# Patient Record
Sex: Female | Born: 1940 | Race: Black or African American | Hispanic: No | Smoking: Never smoker
Health system: Southern US, Community
[De-identification: ages and names within clinical notes are randomized; demographics above are authoritative.]

## PROBLEM LIST (undated history)

## (undated) DIAGNOSIS — K219 Gastro-esophageal reflux disease without esophagitis: Secondary | ICD-10-CM

## (undated) DIAGNOSIS — D649 Anemia, unspecified: Principal | ICD-10-CM

## (undated) DIAGNOSIS — I10 Essential (primary) hypertension: Secondary | ICD-10-CM

## (undated) DIAGNOSIS — E039 Hypothyroidism, unspecified: Secondary | ICD-10-CM

## (undated) DIAGNOSIS — E079 Disorder of thyroid, unspecified: Secondary | ICD-10-CM

## (undated) DIAGNOSIS — E2740 Unspecified adrenocortical insufficiency: Secondary | ICD-10-CM

## (undated) DIAGNOSIS — I509 Heart failure, unspecified: Secondary | ICD-10-CM

## (undated) DIAGNOSIS — R519 Headache, unspecified: Secondary | ICD-10-CM

## (undated) DIAGNOSIS — J45909 Unspecified asthma, uncomplicated: Secondary | ICD-10-CM

## (undated) DIAGNOSIS — R51 Headache: Secondary | ICD-10-CM

## (undated) DIAGNOSIS — H409 Unspecified glaucoma: Secondary | ICD-10-CM

## (undated) DIAGNOSIS — IMO0001 Reserved for inherently not codable concepts without codable children: Secondary | ICD-10-CM

## (undated) HISTORY — DX: Anemia, unspecified: D64.9

## (undated) HISTORY — PX: BACK SURGERY: SHX140

## (undated) HISTORY — PX: OTHER SURGICAL HISTORY: SHX169

## (undated) HISTORY — PX: APPENDECTOMY: SHX54

## (undated) SURGERY — MINOR CAPSULOTOMY
Anesthesia: Topical | Laterality: Right

---

## 1998-02-25 ENCOUNTER — Other Ambulatory Visit: Admission: RE | Admit: 1998-02-25 | Discharge: 1998-02-25 | Payer: Self-pay | Admitting: Family Medicine

## 1998-03-01 ENCOUNTER — Ambulatory Visit (HOSPITAL_COMMUNITY): Admission: RE | Admit: 1998-03-01 | Discharge: 1998-03-01 | Payer: Self-pay | Admitting: Family Medicine

## 1998-03-30 ENCOUNTER — Ambulatory Visit (HOSPITAL_COMMUNITY): Admission: RE | Admit: 1998-03-30 | Discharge: 1998-03-30 | Payer: Self-pay | Admitting: Family Medicine

## 1998-07-25 ENCOUNTER — Ambulatory Visit (HOSPITAL_COMMUNITY): Admission: RE | Admit: 1998-07-25 | Discharge: 1998-07-25 | Payer: Self-pay | Admitting: *Deleted

## 1998-07-25 ENCOUNTER — Inpatient Hospital Stay (HOSPITAL_COMMUNITY): Admission: EM | Admit: 1998-07-25 | Discharge: 1998-07-28 | Payer: Self-pay | Admitting: Emergency Medicine

## 1998-07-25 ENCOUNTER — Encounter: Payer: Self-pay | Admitting: Emergency Medicine

## 1998-10-17 ENCOUNTER — Ambulatory Visit (HOSPITAL_COMMUNITY): Admission: RE | Admit: 1998-10-17 | Discharge: 1998-10-17 | Payer: Self-pay

## 2000-04-15 ENCOUNTER — Emergency Department (HOSPITAL_COMMUNITY): Admission: EM | Admit: 2000-04-15 | Discharge: 2000-04-15 | Payer: Self-pay | Admitting: Emergency Medicine

## 2000-08-23 ENCOUNTER — Emergency Department (HOSPITAL_COMMUNITY): Admission: EM | Admit: 2000-08-23 | Discharge: 2000-08-23 | Payer: Self-pay | Admitting: *Deleted

## 2002-11-06 ENCOUNTER — Encounter: Payer: Self-pay | Admitting: General Practice

## 2002-11-06 ENCOUNTER — Encounter: Admission: RE | Admit: 2002-11-06 | Discharge: 2002-11-06 | Payer: Self-pay | Admitting: General Practice

## 2002-12-08 ENCOUNTER — Encounter: Admission: RE | Admit: 2002-12-08 | Discharge: 2003-03-08 | Payer: Self-pay | Admitting: Family Medicine

## 2003-02-08 ENCOUNTER — Ambulatory Visit (HOSPITAL_COMMUNITY): Admission: RE | Admit: 2003-02-08 | Discharge: 2003-02-08 | Payer: Self-pay | Admitting: Internal Medicine

## 2003-02-08 ENCOUNTER — Encounter: Payer: Self-pay | Admitting: Internal Medicine

## 2003-03-11 ENCOUNTER — Ambulatory Visit (HOSPITAL_COMMUNITY): Admission: RE | Admit: 2003-03-11 | Discharge: 2003-03-11 | Payer: Self-pay | Admitting: Internal Medicine

## 2003-03-11 ENCOUNTER — Encounter: Payer: Self-pay | Admitting: Internal Medicine

## 2004-05-02 ENCOUNTER — Ambulatory Visit (HOSPITAL_COMMUNITY): Admission: RE | Admit: 2004-05-02 | Discharge: 2004-05-02 | Payer: Self-pay | Admitting: Obstetrics & Gynecology

## 2004-05-17 ENCOUNTER — Ambulatory Visit: Payer: Self-pay | Admitting: Internal Medicine

## 2004-05-25 ENCOUNTER — Ambulatory Visit (HOSPITAL_COMMUNITY): Admission: RE | Admit: 2004-05-25 | Discharge: 2004-05-25 | Payer: Self-pay | Admitting: Internal Medicine

## 2004-08-14 ENCOUNTER — Ambulatory Visit: Payer: Self-pay | Admitting: Internal Medicine

## 2005-06-28 ENCOUNTER — Ambulatory Visit (HOSPITAL_COMMUNITY): Admission: RE | Admit: 2005-06-28 | Discharge: 2005-06-28 | Payer: Self-pay | Admitting: Ophthalmology

## 2006-03-23 ENCOUNTER — Emergency Department (HOSPITAL_COMMUNITY): Admission: EM | Admit: 2006-03-23 | Discharge: 2006-03-24 | Payer: Self-pay | Admitting: Emergency Medicine

## 2006-07-11 ENCOUNTER — Ambulatory Visit (HOSPITAL_COMMUNITY): Admission: RE | Admit: 2006-07-11 | Discharge: 2006-07-11 | Payer: Self-pay | Admitting: Ophthalmology

## 2007-04-27 ENCOUNTER — Emergency Department (HOSPITAL_COMMUNITY): Admission: EM | Admit: 2007-04-27 | Discharge: 2007-04-27 | Payer: Self-pay | Admitting: Emergency Medicine

## 2008-03-05 ENCOUNTER — Emergency Department (HOSPITAL_COMMUNITY): Admission: EM | Admit: 2008-03-05 | Discharge: 2008-03-06 | Payer: Self-pay | Admitting: Emergency Medicine

## 2008-03-24 ENCOUNTER — Ambulatory Visit (HOSPITAL_BASED_OUTPATIENT_CLINIC_OR_DEPARTMENT_OTHER): Admission: RE | Admit: 2008-03-24 | Discharge: 2008-03-24 | Payer: Self-pay | Admitting: Orthopedic Surgery

## 2010-08-31 ENCOUNTER — Emergency Department (HOSPITAL_COMMUNITY)
Admission: EM | Admit: 2010-08-31 | Discharge: 2010-08-31 | Payer: Self-pay | Source: Home / Self Care | Admitting: Emergency Medicine

## 2010-08-31 LAB — URINE MICROSCOPIC-ADD ON

## 2010-08-31 LAB — URINALYSIS, ROUTINE W REFLEX MICROSCOPIC
Bilirubin Urine: NEGATIVE
Hemoglobin, Urine: NEGATIVE
Ketones, ur: NEGATIVE mg/dL
Leukocytes, UA: NEGATIVE
Nitrite: NEGATIVE
Protein, ur: 100 mg/dL — AB
Specific Gravity, Urine: 1.017 (ref 1.005–1.030)
Urine Glucose, Fasting: 100 mg/dL — AB
Urobilinogen, UA: 0.2 mg/dL (ref 0.0–1.0)
pH: 7 (ref 5.0–8.0)

## 2010-09-02 ENCOUNTER — Observation Stay (HOSPITAL_COMMUNITY)
Admission: EM | Admit: 2010-09-02 | Discharge: 2010-09-05 | Disposition: A | Discharge status: Other Acute Inpatient Hospital | Payer: Self-pay | Source: Home / Self Care | Attending: Internal Medicine | Admitting: Internal Medicine

## 2010-09-04 ENCOUNTER — Inpatient Hospital Stay (HOSPITAL_COMMUNITY)
Admission: EM | Admit: 2010-09-04 | Discharge: 2010-09-05 | Payer: Self-pay | Attending: Internal Medicine | Admitting: Internal Medicine

## 2010-09-11 LAB — CBC
HCT: 33.5 % — ABNORMAL LOW (ref 36.0–46.0)
HCT: 36.2 % (ref 36.0–46.0)
Hemoglobin: 11.6 g/dL — ABNORMAL LOW (ref 12.0–15.0)
Hemoglobin: 12.3 g/dL (ref 12.0–15.0)
MCH: 30.8 pg (ref 26.0–34.0)
MCH: 30.4 pg (ref 26.0–34.0)
MCHC: 34.6 g/dL (ref 30.0–36.0)
MCHC: 34 g/dL (ref 30.0–36.0)
MCV: 88.9 fL (ref 78.0–100.0)
MCV: 89.4 fL (ref 78.0–100.0)
Platelets: 272 10*3/uL (ref 150–400)
Platelets: 302 10*3/uL (ref 150–400)
RBC: 3.77 MIL/uL — ABNORMAL LOW (ref 3.87–5.11)
RBC: 4.05 MIL/uL (ref 3.87–5.11)
RDW: 12.8 % (ref 11.5–15.5)
RDW: 12.8 % (ref 11.5–15.5)
WBC: 5 10*3/uL (ref 4.0–10.5)
WBC: 4.9 10*3/uL (ref 4.0–10.5)

## 2010-09-11 LAB — URINALYSIS, MICROSCOPIC ONLY
Bilirubin Urine: NEGATIVE
Hgb urine dipstick: NEGATIVE
Ketones, ur: NEGATIVE mg/dL
Leukocytes, UA: NEGATIVE
Nitrite: NEGATIVE
Protein, ur: NEGATIVE mg/dL
Specific Gravity, Urine: 1.011 (ref 1.005–1.030)
Urine Glucose, Fasting: NEGATIVE mg/dL
Urobilinogen, UA: 1 mg/dL (ref 0.0–1.0)
pH: 5.5 (ref 5.0–8.0)

## 2010-09-11 LAB — BASIC METABOLIC PANEL
BUN: 23 mg/dL (ref 6–23)
BUN: 17 mg/dL (ref 6–23)
CO2: 28 mEq/L (ref 19–32)
CO2: 25 mEq/L (ref 19–32)
Calcium: 9.8 mg/dL (ref 8.4–10.5)
Calcium: 9.5 mg/dL (ref 8.4–10.5)
Chloride: 98 mEq/L (ref 96–112)
Chloride: 106 mEq/L (ref 96–112)
Creatinine, Ser: 1 mg/dL (ref 0.4–1.2)
Creatinine, Ser: 0.93 mg/dL (ref 0.4–1.2)
GFR calc Af Amer: >60 mL/min (ref >60)
GFR calc Af Amer: >60 mL/min (ref >60)
GFR calc non Af Amer: 55 mL/min — ABNORMAL LOW (ref >60)
GFR calc non Af Amer: 60 mL/min — ABNORMAL LOW (ref >60)
Glucose, Bld: 136 mg/dL — ABNORMAL HIGH (ref 70–99)
Glucose, Bld: 154 mg/dL — ABNORMAL HIGH (ref 70–99)
Potassium: 4.2 mEq/L (ref 3.5–5.1)
Potassium: 4.4 mEq/L (ref 3.5–5.1)
Sodium: 134 mEq/L — ABNORMAL LOW (ref 135–145)
Sodium: 137 mEq/L (ref 135–145)

## 2010-09-11 LAB — URINE CULTURE
Colony Count: 15000
Culture  Setup Time: 201201081123
Special Requests: NEGATIVE

## 2010-09-11 LAB — DIFFERENTIAL
Basophils Absolute: 0 10*3/uL (ref 0.0–0.1)
Basophils Absolute: 0 10*3/uL (ref 0.0–0.1)
Basophils Relative: 1 % (ref 0–1)
Basophils Relative: 0 % (ref 0–1)
Eosinophils Absolute: 0 10*3/uL (ref 0.0–0.7)
Eosinophils Absolute: 0 10*3/uL (ref 0.0–0.7)
Eosinophils Relative: 1 % (ref 0–5)
Eosinophils Relative: 0 % (ref 0–5)
Lymphocytes Relative: 30 % (ref 12–46)
Lymphocytes Relative: 20 % (ref 12–46)
Lymphs Abs: 1.5 10*3/uL (ref 0.7–4.0)
Lymphs Abs: 1 10*3/uL (ref 0.7–4.0)
Monocytes Absolute: 0.6 10*3/uL (ref 0.1–1.0)
Monocytes Absolute: 0.1 10*3/uL (ref 0.1–1.0)
Monocytes Relative: 12 % (ref 3–12)
Monocytes Relative: 2 % — ABNORMAL LOW (ref 3–12)
Neutro Abs: 2.8 10*3/uL (ref 1.7–7.7)
Neutro Abs: 3.9 10*3/uL (ref 1.7–7.7)
Neutrophils Relative %: 57 % (ref 43–77)
Neutrophils Relative %: 78 % — ABNORMAL HIGH (ref 43–77)

## 2010-09-11 LAB — VITAMIN B12: Vitamin B-12: 403 pg/mL (ref 211–911)

## 2010-09-11 LAB — TSH: TSH: 0.932 u[IU]/mL (ref 0.350–4.500)

## 2010-09-14 NOTE — Discharge Summary (Signed)
  Katie Mosley, FRANEY NO.:  [id number]  MEDICAL RECORD NO.:  [id number]          PATIENT TYPE:  INP  LOCATION:  3009                         FACILITY:  [facility]  PHYSICIAN:  Eleonore Chiquito, MD         DATE OF BIRTH:  08-06-1941  DATE OF ADMISSION:  09/04/2010 DATE OF DISCHARGE:  09/05/2010                              DISCHARGE SUMMARY   ADDENDUM  Discharge diagnoses include: 1. Radiculopathy, L3-L4 broad-based disk bulging, mild-to-moderate     facet hypertrophy. 2. Urinary tract infection. 3. Hypertension. 4. Hypothyroidism.  BRIEF HISTORY AND PHYSICAL:  Please refer to all the details from Dr. Paulina Fusi history and physical from September 04, 2010.  BRIEF HOSPITAL COURSE: 1. The patient was initially admitted to the [facility]     because of difficulty walking due to the pain.  The patient does     not speak [place] and she is from [place]..  The patient was     started on Prednisone and then was later seen by Neurosurgery for     further recommendations.  The patient was transferred to [facility] for possible surgery.  At this time, the Surgery has seen     the patient and cleared her for discharge as the patient is     improved.  They will follow the patient in 2-3 weeks in the office     and she will be discharged home on [facility]. 2. UTI.  The patient had a urine culture done in the hospital which     gave Pseudomonas aeruginosa sensitive to ciprofloxacin.  The     patient will be sent with 1 Cipro for 10 more days. 3. Hypertension.  The patient was Bystolic.  I am going to start the     patient on lisinopril as the patient has uncontrolled hypertension.     The patient will follow up with her primary care physician, Dr.     Jeanie Cooks for further recommendation. 4. Hypothyroidism.  The patient will continue to be on Synthroid 25     mcg p.o. daily.  The final medications on discharge include: 1. Flexeril 10 mg every 8 hours  as necessary. 2. Lisinopril 10 mg p.o. daily. 3. Medrol-Dosepak. 4. Mobic 7.5 mg p.o. b.i.d. 5. Vicodin 1-2 tablets by mouth every 6 hours as needed. 6. Cipro 500 mg 1 tablet p.o. twice a day. 7. Bystolic 10 mg p.o. daily. 8. Iron 65 one tablet p.o. b.i.d. 9. Levothyroxine 25 mcg 1 tablet p.o. daily. 10.Pravastatin 30 mg 1 tablet p.o. daily. 11.Latanoprost ophthalmic 1 drop in left eye daily at bedtime.  Follow up with Dr. Hazle Coca in 2-3 weeks and Dr. Jeanie Cooks.     Eleonore Chiquito, MD     GL/MEDQ  D:  09/05/2010  T:  09/06/2010  Job:  [id number]  cc:   Niel Hummer, MD Otilio Connors, M.D. Nolene Ebbs, M.D.  Electronically Signed by Eleonore Chiquito  on 09/12/2010 09:43:20 AM

## 2010-09-17 ENCOUNTER — Encounter: Payer: Self-pay | Admitting: Gastroenterology

## 2010-09-18 ENCOUNTER — Encounter: Payer: Self-pay | Admitting: Internal Medicine

## 2010-09-27 ENCOUNTER — Encounter: Admission: RE | Admit: 2010-09-27 | Payer: Self-pay | Source: Home / Self Care | Admitting: Internal Medicine

## 2010-10-09 ENCOUNTER — Other Ambulatory Visit: Payer: Self-pay | Admitting: Neurosurgery

## 2010-10-09 DIAGNOSIS — M545 Low back pain: Secondary | ICD-10-CM

## 2010-10-13 ENCOUNTER — Other Ambulatory Visit: Payer: Self-pay

## 2010-10-24 ENCOUNTER — Inpatient Hospital Stay (HOSPITAL_COMMUNITY): Payer: Medicare Other

## 2010-10-24 ENCOUNTER — Inpatient Hospital Stay (HOSPITAL_COMMUNITY)
Admission: RE | Admit: 2010-10-24 | Discharge: 2010-10-25 | DRG: 491 | Disposition: A | Discharge status: Home / Self Care | Payer: Medicare Other | Source: Ambulatory Visit | Attending: Neurosurgery | Admitting: Neurosurgery

## 2010-10-24 ENCOUNTER — Ambulatory Visit (HOSPITAL_COMMUNITY): Payer: Medicare Other

## 2010-10-24 DIAGNOSIS — K219 Gastro-esophageal reflux disease without esophagitis: Secondary | ICD-10-CM | POA: Diagnosis present

## 2010-10-24 DIAGNOSIS — I10 Essential (primary) hypertension: Secondary | ICD-10-CM | POA: Diagnosis present

## 2010-10-24 DIAGNOSIS — M5126 Other intervertebral disc displacement, lumbar region: Principal | ICD-10-CM | POA: Diagnosis present

## 2010-10-24 LAB — CBC
HCT: 35 % — ABNORMAL LOW (ref 36.0–46.0)
HCT: 34.6 % — ABNORMAL LOW (ref 36.0–46.0)
Hemoglobin: 12.3 g/dL (ref 12.0–15.0)
Hemoglobin: 11.7 g/dL — ABNORMAL LOW (ref 12.0–15.0)
MCHC: 35.1 g/dL (ref 30.0–36.0)
RBC: 4 MIL/uL (ref 3.87–5.11)
RBC: 3.97 MIL/uL (ref 3.87–5.11)
WBC: 4.6 10*3/uL (ref 4.0–10.5)

## 2010-10-24 LAB — BASIC METABOLIC PANEL
Calcium: 10 mg/dL (ref 8.4–10.5)
GFR calc Af Amer: >60 mL/min (ref >60)
GFR calc non Af Amer: >60 mL/min (ref >60)
Glucose, Bld: 111 mg/dL — ABNORMAL HIGH (ref 70–99)
Potassium: 4.2 mEq/L (ref 3.5–5.1)
Sodium: 133 mEq/L — ABNORMAL LOW (ref 135–145)

## 2010-10-24 LAB — URINE MICROSCOPIC-ADD ON

## 2010-10-24 LAB — URINALYSIS, ROUTINE W REFLEX MICROSCOPIC
Hgb urine dipstick: NEGATIVE
Leukocytes, UA: NEGATIVE
Specific Gravity, Urine: 1.019 (ref 1.005–1.030)
Urine Glucose, Fasting: NEGATIVE mg/dL
pH: 6 (ref 5.0–8.0)

## 2010-10-24 LAB — PROTIME-INR: INR: 0.87 (ref 0.00–1.49)

## 2010-10-24 LAB — APTT: aPTT: 24 seconds (ref 24–37)

## 2010-10-24 LAB — SURGICAL PCR SCREEN: Staphylococcus aureus: NEGATIVE

## 2010-11-03 NOTE — Op Note (Signed)
[name], Katie Mosley NO.:  [id number]  MEDICAL RECORD NO.:  [id number]           PATIENT TYPE:  I  LOCATION:  [id number]                         FACILITY:  [facility]  PHYSICIAN:  Otilio Connors, M.D.  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  POSTOPERATIVE DIAGNOSIS:  Bilateral herniated nucleus pulposus right L3- 4 with radiculopathy.  PROCEDURE:  Right L3-4 bilateral diskectomy, microdissection with microscope.  SURGEON:  Otilio Connors, MD  ASSISTANT:  Arnoldo Morale.  ANESTHESIA:  General endotracheal tube anesthesia.  ESTIMATED BLOOD LOSS:  Minimal.  BLOOD GIVEN:  None.  DRAINS:  None.  COMPLICATIONS:  None.  REASON FOR PROCEDURE:  The patient is a 70 year old woman who has been having right leg pain radiating to the anterior thigh with dysesthetic sensory change.  Reflex diminished also at right knee.  The patient showed some improvement over the couple of months of the severe pain. She was still having significant problems.  The patient elected to proceed with diskectomy.  PROCEDURE IN DETAIL:  The patient was brought operating room and general anesthesia was induced.  The patient was placed in prone position on Wilson frame with all pressure points padded.  The patient was prepped and draped in sterile fashion.  Site was then injected with 18 mL of 1% lidocaine with epinephrine.  Needle was placed interspace.  X-rays obtained showing needle was at 3-4 interspace.  An incision was made centered where the needle was.  Incision taken to fascia.  The fascia was incised and with muscle splitting  technique, was taken down to the spine.  We actually ended up a bit lateral and could feel the transverse processes, so this approach was aborted as probably we did not __________  lateral, so we actually incised the fascia and the spinous processes and a subperiosteal dissection was done over the L3 lamina after the facets exposed the pars.  Self-retaining retractor was placed and I placed a marker at the interspace.  Another x-ray was obtained confirming our position in 3-4 disk space.  Microscope was brought in for microdissection at this point.  High-speed drill was used to drill the pars of the lateral facet.  This was completed opening the foramen with Kerrison punches.  Removed some hypertrophic ligaments, and we could see the nerve root.  Below the nerve root, caudally was an extremely large disk herniation.  Disk herniation was then removed with pituitary rongeurs and __________ .  There was a large annular tear involving the disk space, placed a micropituitary in there to clean out the loose disk pieces.  We did not open it further.  When we were finished, we continued __________ at the L3 nerve root.  We followed it both distally and proximally and he did not have no further compression on it.  We got hemostasis with Gelfoam and thrombin.  This was then irrigated out with antibiotic solution.  We had good hemostasis.  The fascia was closed with 0 Vicryl sutures at  both spots.  Subcutaneous tissue closed with 2-0 and 3-0 Vicryl interrupted sutures.  Skin closed benzoin, Steri-Strips, dressing was placed.  The patient was placed back in the supine position, awakened from anesthesia, and transferred to recovery room in stable condition.          ______________________________ Otilio Connors, M.D.     JRH/MEDQ  D:  10/24/2010  T:  10/25/2010  Job:  [id number]  Electronically Signed by Hazle Coca M.D. on 11/03/2010 05:06:33 PM

## 2011-01-09 NOTE — Op Note (Signed)
NAMEEVANEE, SQUARE            ACCOUNT NO.:  [id number]   MEDICAL RECORD NO.:  [id number]          PATIENT TYPE:  AMB   LOCATION:  [place]                          FACILITY:  [facility]   PHYSICIAN:  Sheral Apley. Weingold, M.D.DATE OF BIRTH:  06/11/1941   DATE OF PROCEDURE:  03/24/2008  DATE OF DISCHARGE:                               OPERATIVE REPORT   PREOPERATIVE DIAGNOSIS:  Displaced right fifth proximal phalangeal  fracture.   POSTOPERATIVE DIAGNOSIS:  Displaced right fifth proximal phalangeal  fracture.   PROCEDURE:  Open reduction and internal fixation above using a 1.3 mm  six-hole plate and four 1.3 mm screws.   SURGEON:  Sheral Apley. Burney Gauze, MD   ASSISTANT:  None.   ANESTHESIA:  General.   TOURNIQUET TIME:  1 hour and 28 minutes.   COMPLICATIONS:  None.   DRAINS:  None.   OPERATIVE REPORT:  The patient taken to the operating suite after  induction of adequate general anesthesia, right upper extremity was  prepped in sterile fashion.  An Esmarch was used to exsanguinate the  limb and tourniquet was then inflated to 250 mmHg.  At this point in  time, an incision was made U-shaped over the proximal phalanx going to  the PIP and MP joints fifth digit right hand.  A large ulnar-based flap  was elevated and the extensor mechanism was split longitudinally.  A  periosteal window was elevated toward the radial side.  The fracture  site was identified.  It was reduced with longitudinal traction and  reduction clamp.  A single 1.5-mm lag screw was placed from ulnar to  radial, however, there was significant osteoporosis and screw cut out on  the radial side.  After this was done, decision was made to convert to  plate fixation.  At this point in time, a six-hole 1.3 mm plate was  fastened proximally and distally with 1.3 mm screws to restore anatomic  alignment.  The wound was then irrigated.  Intraoperative fluoroscopy  revealed near anatomic reduction in both the AP and  lateral oblique  view.  Periosteum was closed with 4-0 Vicryl.  The extensor mechanism  with 4-0 Mersilene followed by skin closure with 4-0 Vicryl Rapide  suture.  Xeroform, 4x4s and ulnar gutter splint was applied.  The  patient tolerated the procedure well and went to recovery room in stable  the fashion.      Sheral Apley Burney Gauze, M.D.  Electronically Signed    MAW/MEDQ  D:  03/24/2008  T:  03/25/2008  Job:  [id number]

## 2011-05-24 LAB — URINALYSIS, ROUTINE W REFLEX MICROSCOPIC
Glucose, UA: 100 — AB
Hgb urine dipstick: NEGATIVE
Ketones, ur: NEGATIVE
Protein, ur: NEGATIVE
Urobilinogen, UA: 1

## 2011-05-24 LAB — CBC
HCT: 31.5 — ABNORMAL LOW
MCHC: 33.3
MCV: 90.4
Platelets: 315
WBC: 7.9

## 2011-05-24 LAB — POCT I-STAT, CHEM 8
BUN: 15
Calcium, Ion: 1.25
Chloride: 98
Creatinine, Ser: 0.9
Glucose, Bld: 155 — ABNORMAL HIGH
HCT: 33 — ABNORMAL LOW
Hemoglobin: 11.2 — ABNORMAL LOW
Potassium: 4.5
Sodium: 130 — ABNORMAL LOW
TCO2: 23

## 2011-05-24 LAB — DIFFERENTIAL
Basophils Relative: 1
Eosinophils Absolute: 0
Eosinophils Relative: 0
Lymphs Abs: 1.3
Monocytes Relative: 9
Neutrophils Relative %: 74

## 2011-05-24 LAB — URINE MICROSCOPIC-ADD ON

## 2011-05-25 LAB — POCT I-STAT, CHEM 8
BUN: 30 — ABNORMAL HIGH
Calcium, Ion: 1.22
Chloride: 103
Creatinine, Ser: 1.2
Glucose, Bld: 95
HCT: 33 — ABNORMAL LOW
Hemoglobin: 11.2 — ABNORMAL LOW
Potassium: 5.7 — ABNORMAL HIGH
Sodium: 132 — ABNORMAL LOW
TCO2: 24

## 2011-08-23 ENCOUNTER — Other Ambulatory Visit: Payer: Self-pay | Admitting: Ophthalmology

## 2011-08-23 ENCOUNTER — Encounter: Payer: Self-pay | Admitting: Ophthalmology

## 2011-08-23 ENCOUNTER — Encounter (HOSPITAL_COMMUNITY)
Admission: RE | Disposition: A | Discharge status: Home / Self Care | Payer: Self-pay | Source: Ambulatory Visit | Attending: Ophthalmology

## 2011-08-23 ENCOUNTER — Ambulatory Visit (HOSPITAL_COMMUNITY)
Admission: RE | Admit: 2011-08-23 | Discharge: 2011-08-23 | Disposition: A | Discharge status: Home / Self Care | Payer: Medicare Other | Source: Ambulatory Visit | Attending: Ophthalmology | Admitting: Ophthalmology

## 2011-08-23 DIAGNOSIS — H269 Unspecified cataract: Secondary | ICD-10-CM | POA: Insufficient documentation

## 2011-08-23 HISTORY — PX: CAPSULOTOMY: SHX5412

## 2011-08-23 SURGERY — MINOR CAPSULOTOMY
Anesthesia: LOCAL | Laterality: Left

## 2011-08-23 SURGERY — MINOR CAPSULOTOMY
Anesthesia: Topical | Laterality: Right

## 2011-08-23 MED ORDER — APRACLONIDINE HCL 1 % OP SOLN
1.0000 [drp] | Freq: Two times a day (BID) | OPHTHALMIC | Status: DC
  Administered 2011-08-23: 1 [drp] via OPHTHALMIC

## 2011-08-23 MED ORDER — CYCLOPENTOLATE-PHENYLEPHRINE 0.2-1 % OP SOLN
OPHTHALMIC | Status: AC
  Administered 2011-08-23: 1 [drp] via OPHTHALMIC
  Filled 2011-08-23: qty 2

## 2011-08-23 MED ORDER — CYCLOPENTOLATE-PHENYLEPHRINE 0.2-1 % OP SOLN: 1.0000 [drp] | OPHTHALMIC | Status: DC

## 2011-08-23 MED ORDER — CYCLOPENTOLATE-PHENYLEPHRINE 0.2-1 % OP SOLN
1.0000 [drp] | OPHTHALMIC | Status: DC
  Administered 2011-08-23 (×2): 1 [drp] via OPHTHALMIC

## 2011-08-23 MED ORDER — APRACLONIDINE HCL 0.5 % OP SOLN: 1.0000 [drp] | Freq: Once | OPHTHALMIC | Status: DC

## 2011-08-23 MED ORDER — APRACLONIDINE HCL 0.5 % OP SOLN
OPHTHALMIC | Status: AC
  Administered 2011-08-23: 1 [drp] via OPHTHALMIC
  Filled 2011-08-23: qty 5

## 2011-08-23 NOTE — Progress Notes (Signed)
1204 cyclomydril 1 gtt od .Marland KitchenMarland Kitchen

## 2011-08-23 NOTE — H&P (Signed)
70 yo non English speaking female had cataract surgery right eye several years ago.  She has developed a cloudy posterior capsule which causes her not to see well.  She has been unamenable to laser surgery until now.  She is admitted at this time for yag laser capsulotomy right eye.                                       [facility]                                 Minor Surgery Room Physician's Record                                  [  ] Minor Procedure    [ x ]Yag Laser           08/23/2011   Brief History/ Findings:Opaque posterior capsule right eye. [facility]  Right   20/100        Left  20/80 No diagnosis found.  Local Anesthetic :  None   Anesthesia Post Note  Patient: Katie Mosley  Procedure(s) Performed: Yag laser capsulotomy right eye  Anesthesia type: nonen Patient location: PACU  Post pain: Pain level controlled  Post assessment: Pain level controlled  Last Vitals: There were no vitals filed for this visit.  Post vital signs: stable  Level of consciousness: awake  Complications: No apparent anesthesia complications   Procedure: Yag laser capsulotomy    Specimen Removed: none  Disposition:  [  ] Pathology, Routine                        [  ] Other     [  ] Disposed of  Condition of Patient Post Procedure: [ x ]  Stable  Discharge Instructions:  [  ]  Diet , regular   [  ] Other          Activity: [x  ] No restrictions  [  ] Other          Medication:none          Follow-up: 5 PM today          Other:  Time: 1 PM     [place]

## 2011-08-23 NOTE — Progress Notes (Signed)
Time out completed,yag laser done on right eye.

## 2011-08-23 NOTE — Progress Notes (Signed)
Patient ID: Katie Mosley, female   DOB: 01-Dec-1940, 70 y.o.   MRN: [id number]

## 2011-08-23 NOTE — Brief Op Note (Signed)
[place]                                 Minor Surgery Room Physician's Record                                  [  ] Minor Procedure    [ x ]Yag Laser           08/23/2011   Brief History/ Findings: 70 yo female has had cataract surgery od.  Now has opaque posterior capsule which causes her to have blurred vision.  To have yag laser capsulotomy to improve her vision. No diagnosis found.  Local Anesthesia    Anesthesia Post Note  Patient: Katie Mosley  Procedure(s) Performed: * No surgery found *  Anesthesia type:   Patient location: Short Stay  Post pain: Pain level controlled  Post assessment: Post-op Vital signs reviewed  Last Vitals: There were no vitals filed for this visit.  Post vital signs: stable  Level of consciousness: awake  Complications: No apparent anesthesia complications   Procedure:                                        [place]                                 Minor Surgery Room Physician's Record                                  [  ] Minor Procedure    [  ]Yag Laser           08/23/2011   Brief History/ Findings:  No diagnosis found.  Local Anesthetic :      Anesthesia Post Note  Patient: Katie Mosley  Procedure(s) Performed: * No surgery found  Anesthesia type:   Patient location: Short Stay  Post pain: Pain level controlled  Post assessment: Post-op Vital signs reviewed  Last Vitals: There were no vitals filed for this visit.  Post vital signs: stable  Level of consciousness: awake  Complications: No apparent anesthesia complications   Procedure: yag laser capsulotomy Total Energy   110 Total Bursts      38 Energy per shot   3.1     Specimen Removed: (Type & Number)   Disposition:  [  ] Pathology, Routine                        [  ] Other     [  ] Disposed of  Condition of Patient Post Procedure: [  ]  Stable  [  ] Other      Discharge Instructions:  [  ]  Diet , regular   [  ] Other          Activity: [  ] No restrictions  [  ] Other          Medication:           Follow-up:           Other:  Time:         Garlan Fair E       Specimen Removed: (Type & Number)   Disposition:  [  ] Pathology, Routine                        [  ] Other     [  ] Disposed of  Condition of Patient Post Procedure: [ x  Stable  [  ] Other     Discharge Instructions:  [  ]  Diet , regular   [  ] Other          Activity: [  ] No restrictions  [  ] Other          Medication:           Follow-up: 5 PM today          Other:   Time:  1:30       [place]

## 2011-08-24 ENCOUNTER — Encounter (HOSPITAL_COMMUNITY): Payer: Self-pay | Admitting: *Deleted

## 2011-08-25 ENCOUNTER — Ambulatory Visit: Admit: 2011-08-25 | Payer: Self-pay | Admitting: Ophthalmology

## 2011-10-10 ENCOUNTER — Other Ambulatory Visit: Payer: Self-pay | Admitting: Ophthalmology

## 2012-08-07 ENCOUNTER — Other Ambulatory Visit: Payer: Self-pay | Admitting: Internal Medicine

## 2012-08-07 DIAGNOSIS — Z1231 Encounter for screening mammogram for malignant neoplasm of breast: Secondary | ICD-10-CM

## 2012-09-01 ENCOUNTER — Ambulatory Visit (HOSPITAL_COMMUNITY)
Admission: RE | Admit: 2012-09-01 | Discharge: 2012-09-01 | Disposition: A | Discharge status: Home / Self Care | Payer: Medicare Other | Source: Ambulatory Visit | Attending: Internal Medicine | Admitting: Internal Medicine

## 2012-09-01 DIAGNOSIS — Z1231 Encounter for screening mammogram for malignant neoplasm of breast: Secondary | ICD-10-CM | POA: Insufficient documentation

## 2013-03-16 ENCOUNTER — Encounter (HOSPITAL_COMMUNITY): Payer: Self-pay | Admitting: *Deleted

## 2013-03-16 DIAGNOSIS — IMO0002 Reserved for concepts with insufficient information to code with codable children: Secondary | ICD-10-CM | POA: Insufficient documentation

## 2013-03-16 DIAGNOSIS — T18108A Unspecified foreign body in esophagus causing other injury, initial encounter: Secondary | ICD-10-CM | POA: Insufficient documentation

## 2013-03-16 DIAGNOSIS — R1310 Dysphagia, unspecified: Secondary | ICD-10-CM | POA: Insufficient documentation

## 2013-03-16 NOTE — ED Notes (Signed)
Pt stats that yesterday she was eating chicken and she felt like apiece of the chicken got stuck in her throat. Pt vomited today but has not eaten or drank anything all day. Pt has some pain in her throat. Pt voice is clear, pt denies difficulty breathing, pt not SOB.

## 2013-03-17 ENCOUNTER — Encounter (HOSPITAL_COMMUNITY): Admission: EM | Disposition: A | Discharge status: Home / Self Care | Payer: Self-pay | Source: Home / Self Care

## 2013-03-17 ENCOUNTER — Encounter (HOSPITAL_COMMUNITY): Payer: Self-pay | Admitting: Gastroenterology

## 2013-03-17 ENCOUNTER — Emergency Department (HOSPITAL_COMMUNITY)
Admission: EM | Admit: 2013-03-17 | Discharge: 2013-03-17 | Disposition: A | Discharge status: Home / Self Care | Payer: Medicare Other | Attending: Internal Medicine | Admitting: Internal Medicine

## 2013-03-17 ENCOUNTER — Emergency Department (HOSPITAL_COMMUNITY): Payer: Medicare Other

## 2013-03-17 DIAGNOSIS — T18128A Food in esophagus causing other injury, initial encounter: Secondary | ICD-10-CM

## 2013-03-17 DIAGNOSIS — T18108A Unspecified foreign body in esophagus causing other injury, initial encounter: Secondary | ICD-10-CM

## 2013-03-17 HISTORY — DX: Essential (primary) hypertension: I10

## 2013-03-17 HISTORY — DX: Gastro-esophageal reflux disease without esophagitis: K21.9

## 2013-03-17 HISTORY — DX: Hypothyroidism, unspecified: E03.9

## 2013-03-17 HISTORY — PX: ESOPHAGOGASTRODUODENOSCOPY: SHX5428

## 2013-03-17 SURGERY — EGD (ESOPHAGOGASTRODUODENOSCOPY)
Anesthesia: Moderate Sedation

## 2013-03-17 MED ORDER — MIDAZOLAM HCL 10 MG/2ML IJ SOLN
INTRAMUSCULAR | Status: DC | PRN
  Administered 2013-03-17: 1 mg via INTRAVENOUS
  Administered 2013-03-17: 2 mg via INTRAVENOUS

## 2013-03-17 MED ORDER — GLUCAGON HCL (RDNA) 1 MG IJ SOLR
1.0000 mg | Freq: Once | INTRAMUSCULAR | Status: AC
  Administered 2013-03-17: 1 mg via INTRAVENOUS
  Filled 2013-03-17: qty 1

## 2013-03-17 MED ORDER — MIDAZOLAM HCL 5 MG/ML IJ SOLN
INTRAMUSCULAR | Status: AC
  Filled 2013-03-17: qty 2

## 2013-03-17 MED ORDER — FENTANYL CITRATE 0.05 MG/ML IJ SOLN
INTRAMUSCULAR | Status: DC | PRN
  Administered 2013-03-17: 25 ug via INTRAVENOUS

## 2013-03-17 MED ORDER — FENTANYL CITRATE 0.05 MG/ML IJ SOLN
INTRAMUSCULAR | Status: AC
  Filled 2013-03-17: qty 2

## 2013-03-17 MED ORDER — SODIUM CHLORIDE 0.9 % IV SOLN
INTRAVENOUS | Status: DC
  Administered 2013-03-17: 03:00:00 via INTRAVENOUS

## 2013-03-17 MED ORDER — BUTAMBEN-TETRACAINE-BENZOCAINE 2-2-14 % EX AERO
INHALATION_SPRAY | CUTANEOUS | Status: DC | PRN
  Administered 2013-03-17: 2 via TOPICAL

## 2013-03-17 NOTE — Op Note (Signed)
[facility] [place], [id number]   ENDOSCOPY PROCEDURE REPORT  PATIENT: Katie, Mosley  MR#: [id number] BIRTHDATE: 1941-01-05 , 72  yrs. old GENDER: Female ENDOSCOPIST: Eustace Quail, MD REFERRED BY:  Dr. Florina Ou EDP PROCEDURE DATE:  03/17/2013 PROCEDURE:  EGD w/ fb removal ASA CLASS:     Class II INDICATIONS:  Dysphagia.  Meat impaction MEDICATIONS: Fentanyl 25 mcg IV and Versed 3 mg IV TOPICAL ANESTHETIC: Cetacaine Spray  DESCRIPTION OF PROCEDURE: After the risks benefits and alternatives of the procedure were thoroughly explained, informed consent was obtained.  The PENTAX GASTOROSCOPE [id number] endoscope was introduced through the mouth and advanced to the second portion of the duodenum. Without limitations.  The instrument was slowly withdrawn as the mucosa was fully examined.      EXAM: Proximal esophageal food impaction at focal 61mm stricture (@15cm ).  Impaction gently pushed into stomach.  Also, 6mm ring-like stricture at GEJ(32 cm).  The stomach and duodenum were normal.  Retroflexed views revealed no abnormalities.     The scope was then withdrawn from the patient and the procedure completed.  COMPLICATIONS: There were no complications.  ENDOSCOPIC IMPRESSION: 1. Proximal esophageal food impaction at focal 65mm stricture (@15cm ). S/P endoscopic removal. 2. 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 Otherwise normal exam  RECOMMENDATIONS: 1.  Clear liquids until 9 am, then soft foods rest of day.  Resume prior diet tomorrow. Be careful with meat 2.  Continue PPI 3.  Call my office to arrange EGD/DILATION at HOSPITAL (discussed with daughters)  REPEAT EXAM:  eSigned:  Eustace Quail, MD 03/17/2013 11:07 AM  [id number] Jeanie Cooks, MD and The Patient

## 2013-03-17 NOTE — ED Provider Notes (Signed)
History    CSN: [id number] Arrival date & time 03/16/13  2228  First MD Initiated Contact with Patient 03/17/13 [phone]     Chief Complaint  Patient presents with  . Foreign Body    in throat   (Consider location/radiation/quality/duration/timing/severity/associated sxs/prior Treatment) HPI This is a 72 year old female who was eating chicken 2 evenings ago. She believes she got a chicken bone stuck in her throat. She has a foreign body sensation in the back of her throat has been unable to eat or drink anything, even swallow her own saliva, since that time. The foreign body sensation is not severe. She is having no difficulty breathing.   History reviewed. No pertinent past medical history. Past Surgical History  Procedure Laterality Date  . Capsulotomy  08/23/2011    Procedure: MINOR CAPSULOTOMY;  Surgeon: Myrtha Mantis.;  Location: [place] [facility];  Service: Ophthalmology;  Laterality: Left;  YAG Capsulotomy Left Eye   History reviewed. No pertinent family history. History  Substance Use Topics  . Smoking status: Not on file  . Smokeless tobacco: Not on file  . Alcohol Use: Not on file   OB History   Grav Para Term Preterm Abortions TAB SAB Ect Mult Living                 Review of Systems  All other systems reviewed and are negative.    Allergies  Review of patient's allergies indicates no known allergies.  Home Medications   Current Outpatient Rx  Name  Route  Sig  Dispense  Refill  . furosemide (LASIX) 20 MG tablet   Oral   Take 20 mg by mouth daily as needed (foot swelling).         . lansoprazole (PREVACID) 30 MG capsule   Oral   Take 30 mg by mouth daily.           Marland Kitchen latanoprost (XALATAN) 0.005 % ophthalmic solution   Left Eye   Place 1 drop into the left eye at bedtime.           Marland Kitchen levothyroxine (SYNTHROID, LEVOTHROID) 25 MCG tablet   Oral   Take 25 mcg by mouth daily.           Marland Kitchen loratadine (CLARITIN) 10 MG tablet   Oral   Take 10 mg by  mouth daily. allergies         . metoprolol succinate (TOPROL-XL) 25 MG 24 hr tablet   Oral   Take 25 mg by mouth daily.          BP 160/66  Pulse 64  Temp(Src) 98 F (36.7 C) (Oral)  Resp 14  SpO2 98%  Physical Exam General: Well-developed, well-nourished female in no acute distress; appearance consistent with age of record HENT: normocephalic, atraumatic; normal appearing oropharynx; no foreign bodies seen; no dysphonia or stridor; poor dentition Eyes: pupils equal round and reactive to light; extraocular muscles intact Neck: supple Heart: regular rate and rhythm Lungs: clear to auscultation bilaterally Abdomen: soft; nondistended; nontender; bowel sounds present Extremities: No deformity; full range of motion; pulses normal Neurologic: Awake, alert; motor function intact in all extremities and symmetric; no facial droop Skin: Warm and dry Psychiatric: Normal mood and affect    ED Course  Procedures (including critical care time)   MDM  Nursing notes and vitals signs, including pulse oximetry, reviewed.  Summary of this visit's results, reviewed by myself:  Imaging Studies: Dg Neck Soft Tissue  03/17/2013   *RADIOLOGY  REPORT*  Clinical Data: Foreign body sensation in the throat.  Possible check-in bone stuck since Sunday.  Difficulty swallowing.  NECK SOFT TISSUES - 1+ VIEW  Comparison: None.  Findings: Prominent calcification of the thyroid cartilage.  No definite evidence of radiopaque foreign body.  Tracheal airway and esophageal air column appear intact.  No prevertebral soft tissue swelling.  Degenerative changes in the cervical spine.  No submental soft tissue swelling.  IMPRESSION: Calcifications in the neck likely represent prominent calcification in the thyroid cartilage.  No definite evidence of any radiopaque foreign body.   Original Report Authenticated By: Lucienne Capers, M.D.   3:38 AM No relief with IV glucagon. Still unable to swallow liquids.  4:07  AM Dr. Henrene Pastor to see patient in ED.   Wynetta Fines, MD 03/17/13 [phone]

## 2013-03-17 NOTE — H&P (Signed)
  HISTORY OF PRESENT ILLNESS:  Katie Mosley is a 72 y.o. female with GERD. Presents with meat impaction via ER. No response to glucagon. GI called a little after 4 am.Unable to handle secretions.  REVIEW OF SYSTEMS:  All non-GI ROS negative   Past Medical History  Diagnosis Date  . Hypertension   . Hypothyroidism   . GERD (gastroesophageal reflux disease)     Past Surgical History  Procedure Laterality Date  . Capsulotomy  08/23/2011    Procedure: MINOR CAPSULOTOMY;  Surgeon: Myrtha Mantis.;  Location: [facility];  Service: Ophthalmology;  Laterality: Left;  YAG Capsulotomy Left Eye    Social History [name]  reports that she does not drink alcohol or use illicit drugs. Her tobacco history is not on file.  family history is not on file.  No Known Allergies     PHYSICAL EXAMINATION: Vital signs: BP 194/84  Pulse 80  Temp(Src) 98.1 F (36.7 C) (Oral)  Resp 21  Ht 5' (1.524 m)  Wt 125 lb (56.7 kg)  BMI 24.41 kg/m2  SpO2 95% General: Well-developed, well-nourished, no acute distress HEENT: Sclerae are anicteric, conjunctiva pink. Oral mucosa intact Lungs: Clear Heart: Regular Abdomen: soft, nontender, nondistended, no obvious ascites, no peritoneal signs, normal bowel sounds. No organomegaly. Extremities: No edema Psychiatric: alert and oriented x3. Cooperative     ASSESSMENT:  1. Meat impaction 2. GERD   PLAN:  1. Urgent therapeutic EGD.The nature of the procedure, as well as the risks, benefits, and alternatives were carefully and thoroughly reviewed with the patient. Ample time for discussion and questions allowed. The patient understood, was satisfied, and agreed to proceed.

## 2013-03-17 NOTE — ED Notes (Signed)
Pt given cup of water to drink but still unable to swallow. MD aware.

## 2013-03-17 NOTE — ED Notes (Signed)
Went in to Pt room to get vitals while handing care over to the next shift, when getting ready to take pt vitals the nurse from Endo that had come to get the pt to go up stairs  Refused to let me take her vitals. Endo Nurse told me that pt would be in the computer and her vitals would show up. No more vitals done in the ED.

## 2013-03-17 NOTE — ED Notes (Signed)
Nurse First Rounds : Nurse explained delay , process and wait time , respirations unlabored / denies pain at this time.

## 2013-03-17 NOTE — ED Notes (Signed)
Gastroenterology here to take pt.

## 2013-03-18 ENCOUNTER — Encounter (HOSPITAL_COMMUNITY): Payer: Self-pay | Admitting: Internal Medicine

## 2013-03-24 ENCOUNTER — Other Ambulatory Visit: Payer: Self-pay | Admitting: Internal Medicine

## 2013-03-24 DIAGNOSIS — K222 Esophageal obstruction: Secondary | ICD-10-CM

## 2013-03-25 ENCOUNTER — Telehealth: Payer: Self-pay

## 2013-03-25 NOTE — Telephone Encounter (Signed)
Too bad. I made it clear to them that this was important and should not wait. If they decide to reschedule at some point, let me know and we will see what we can do.

## 2013-03-25 NOTE — Telephone Encounter (Signed)
Pt scheduled for EGD with [facility] 03/27/13@11am . Called pts family back to confirm this appt but family now states she does not have transportation for that day. State they are going out of town and want to know when they can reschedule her procedure. Dr. Henrene Pastor please advise.

## 2013-03-27 ENCOUNTER — Ambulatory Visit (HOSPITAL_COMMUNITY): Admit: 2013-03-27 | Payer: Medicare Other | Admitting: Internal Medicine

## 2013-03-27 ENCOUNTER — Encounter (HOSPITAL_COMMUNITY): Payer: Self-pay

## 2013-03-27 SURGERY — EGD (ESOPHAGOGASTRODUODENOSCOPY)
Anesthesia: Moderate Sedation

## 2014-08-04 ENCOUNTER — Other Ambulatory Visit (HOSPITAL_COMMUNITY): Payer: Self-pay | Admitting: Internal Medicine

## 2014-08-04 ENCOUNTER — Ambulatory Visit (HOSPITAL_COMMUNITY)
Admission: RE | Admit: 2014-08-04 | Discharge: 2014-08-04 | Disposition: A | Discharge status: Home / Self Care | Payer: Medicare Other | Source: Ambulatory Visit | Attending: Internal Medicine | Admitting: Internal Medicine

## 2014-08-04 DIAGNOSIS — R079 Chest pain, unspecified: Secondary | ICD-10-CM | POA: Insufficient documentation

## 2014-08-04 DIAGNOSIS — R0789 Other chest pain: Secondary | ICD-10-CM

## 2014-10-31 ENCOUNTER — Inpatient Hospital Stay (HOSPITAL_COMMUNITY)
Admission: EM | Admit: 2014-10-31 | Discharge: 2014-11-03 | DRG: 394 | Disposition: A | Discharge status: Home / Self Care | Payer: Medicare Other | Attending: Internal Medicine | Admitting: Internal Medicine

## 2014-10-31 ENCOUNTER — Encounter (HOSPITAL_COMMUNITY): Payer: Self-pay | Admitting: *Deleted

## 2014-10-31 ENCOUNTER — Emergency Department (HOSPITAL_COMMUNITY): Payer: Medicare Other

## 2014-10-31 DIAGNOSIS — K5900 Constipation, unspecified: Secondary | ICD-10-CM | POA: Diagnosis present

## 2014-10-31 DIAGNOSIS — I10 Essential (primary) hypertension: Secondary | ICD-10-CM

## 2014-10-31 DIAGNOSIS — E871 Hypo-osmolality and hyponatremia: Secondary | ICD-10-CM | POA: Diagnosis present

## 2014-10-31 DIAGNOSIS — K559 Vascular disorder of intestine, unspecified: Secondary | ICD-10-CM | POA: Diagnosis present

## 2014-10-31 DIAGNOSIS — E869 Volume depletion, unspecified: Secondary | ICD-10-CM | POA: Diagnosis present

## 2014-10-31 DIAGNOSIS — E039 Hypothyroidism, unspecified: Secondary | ICD-10-CM | POA: Diagnosis present

## 2014-10-31 DIAGNOSIS — K219 Gastro-esophageal reflux disease without esophagitis: Secondary | ICD-10-CM | POA: Diagnosis present

## 2014-10-31 DIAGNOSIS — R109 Unspecified abdominal pain: Secondary | ICD-10-CM | POA: Diagnosis not present

## 2014-10-31 LAB — COMPREHENSIVE METABOLIC PANEL
ALT: 26 U/L (ref 0–35)
AST: 37 U/L (ref 0–37)
Albumin: 3.8 g/dL (ref 3.5–5.2)
Alkaline Phosphatase: 74 U/L (ref 39–117)
Anion gap: 7 (ref 5–15)
BILIRUBIN TOTAL: 1 mg/dL (ref 0.3–1.2)
BUN: 25 mg/dL — ABNORMAL HIGH (ref 6–23)
CO2: 27 mmol/L (ref 19–32)
Calcium: 9.3 mg/dL (ref 8.4–10.5)
Chloride: 94 mmol/L — ABNORMAL LOW (ref 96–112)
Creatinine, Ser: 1.03 mg/dL (ref 0.50–1.10)
GFR calc Af Amer: 61 mL/min — ABNORMAL LOW (ref >90)
GFR, EST NON AFRICAN AMERICAN: 52 mL/min — AB (ref >90)
Glucose, Bld: 102 mg/dL — ABNORMAL HIGH (ref 70–99)
Potassium: 4 mmol/L (ref 3.5–5.1)
SODIUM: 128 mmol/L — AB (ref 135–145)
Total Protein: 7.7 g/dL (ref 6.0–8.3)

## 2014-10-31 LAB — CBC WITH DIFFERENTIAL/PLATELET
Basophils Absolute: 0 10*3/uL (ref 0.0–0.1)
Basophils Relative: 0 % (ref 0–1)
Eosinophils Absolute: 0 10*3/uL (ref 0.0–0.7)
Eosinophils Relative: 0 % (ref 0–5)
HCT: 35.4 % — ABNORMAL LOW (ref 36.0–46.0)
Hemoglobin: 12.4 g/dL (ref 12.0–15.0)
LYMPHS ABS: 1.5 10*3/uL (ref 0.7–4.0)
LYMPHS PCT: 13 % (ref 12–46)
MCH: 30.2 pg (ref 26.0–34.0)
MCHC: 35 g/dL (ref 30.0–36.0)
MCV: 86.3 fL (ref 78.0–100.0)
Monocytes Absolute: 0.8 10*3/uL (ref 0.1–1.0)
Monocytes Relative: 7 % (ref 3–12)
NEUTROS PCT: 80 % — AB (ref 43–77)
Neutro Abs: 9.4 10*3/uL — ABNORMAL HIGH (ref 1.7–7.7)
Platelets: 338 10*3/uL (ref 150–400)
RBC: 4.1 MIL/uL (ref 3.87–5.11)
RDW: 13 % (ref 11.5–15.5)
WBC: 11.7 10*3/uL — ABNORMAL HIGH (ref 4.0–10.5)

## 2014-10-31 LAB — URINE MICROSCOPIC-ADD ON

## 2014-10-31 LAB — URINALYSIS, ROUTINE W REFLEX MICROSCOPIC
BILIRUBIN URINE: NEGATIVE
GLUCOSE, UA: NEGATIVE mg/dL
Ketones, ur: NEGATIVE mg/dL
Leukocytes, UA: NEGATIVE
Nitrite: NEGATIVE
PH: 5.5 (ref 5.0–8.0)
PROTEIN: 30 mg/dL — AB
SPECIFIC GRAVITY, URINE: 1.008 (ref 1.005–1.030)
UROBILINOGEN UA: 0.2 mg/dL (ref 0.0–1.0)

## 2014-10-31 LAB — I-STAT CG4 LACTIC ACID, ED: Lactic Acid, Venous: 0.67 mmol/L (ref 0.5–2.0)

## 2014-10-31 LAB — APTT: APTT: 33 s (ref 24–37)

## 2014-10-31 LAB — POC OCCULT BLOOD, ED: Fecal Occult Bld: POSITIVE — AB

## 2014-10-31 LAB — PROTIME-INR
INR: 1.05 (ref 0.00–1.49)
PROTHROMBIN TIME: 13.8 s (ref 11.6–15.2)

## 2014-10-31 LAB — LIPASE, BLOOD: LIPASE: 18 U/L (ref 11–59)

## 2014-10-31 MED ORDER — IOHEXOL 300 MG/ML  SOLN
50.0000 mL | Freq: Once | INTRAMUSCULAR | Status: AC | PRN
  Administered 2014-10-31: 50 mL via ORAL

## 2014-10-31 MED ORDER — ACETAMINOPHEN 650 MG RE SUPP: 650.0000 mg | Freq: Four times a day (QID) | RECTAL | Status: DC | PRN

## 2014-10-31 MED ORDER — DARIFENACIN HYDROBROMIDE ER 7.5 MG PO TB24
7.5000 mg | ORAL_TABLET | Freq: Every day | ORAL | Status: DC
  Administered 2014-10-31 – 2014-11-03 (×4): 7.5 mg via ORAL
  Filled 2014-10-31 (×4): qty 1

## 2014-10-31 MED ORDER — LEVOTHYROXINE SODIUM 50 MCG PO TABS
50.0000 ug | ORAL_TABLET | Freq: Every day | ORAL | Status: DC
  Administered 2014-11-01 – 2014-11-03 (×3): 50 ug via ORAL
  Filled 2014-10-31 (×4): qty 1

## 2014-10-31 MED ORDER — METOPROLOL SUCCINATE ER 50 MG PO TB24
50.0000 mg | ORAL_TABLET | Freq: Every day | ORAL | Status: DC
  Administered 2014-10-31 – 2014-11-03 (×4): 50 mg via ORAL
  Filled 2014-10-31 (×4): qty 1

## 2014-10-31 MED ORDER — CIPROFLOXACIN IN D5W 400 MG/200ML IV SOLN
400.0000 mg | Freq: Once | INTRAVENOUS | Status: AC
  Administered 2014-10-31: 400 mg via INTRAVENOUS
  Filled 2014-10-31: qty 200

## 2014-10-31 MED ORDER — PANTOPRAZOLE SODIUM 40 MG PO TBEC
40.0000 mg | DELAYED_RELEASE_TABLET | Freq: Every day | ORAL | Status: DC
  Administered 2014-10-31 – 2014-11-03 (×4): 40 mg via ORAL
  Filled 2014-10-31 (×3): qty 1

## 2014-10-31 MED ORDER — SODIUM CHLORIDE 0.9 % IV SOLN
INTRAVENOUS | Status: DC
  Administered 2014-10-31: 09:00:00 via INTRAVENOUS

## 2014-10-31 MED ORDER — PIPERACILLIN-TAZOBACTAM 3.375 G IVPB
3.3750 g | Freq: Three times a day (TID) | INTRAVENOUS | Status: DC
  Administered 2014-10-31 – 2014-11-02 (×7): 3.375 g via INTRAVENOUS
  Filled 2014-10-31 (×8): qty 50

## 2014-10-31 MED ORDER — ONDANSETRON HCL 4 MG PO TABS: 4.0000 mg | ORAL_TABLET | Freq: Four times a day (QID) | ORAL | Status: DC | PRN

## 2014-10-31 MED ORDER — ONDANSETRON HCL 4 MG/2ML IJ SOLN: 4.0000 mg | Freq: Four times a day (QID) | INTRAMUSCULAR | Status: DC | PRN

## 2014-10-31 MED ORDER — IOHEXOL 300 MG/ML  SOLN
80.0000 mL | Freq: Once | INTRAMUSCULAR | Status: AC | PRN
  Administered 2014-10-31: 80 mL via INTRAVENOUS

## 2014-10-31 MED ORDER — METRONIDAZOLE IN NACL 5-0.79 MG/ML-% IV SOLN
500.0000 mg | Freq: Once | INTRAVENOUS | Status: AC
  Administered 2014-10-31: 500 mg via INTRAVENOUS
  Filled 2014-10-31: qty 100

## 2014-10-31 MED ORDER — ACETAMINOPHEN 325 MG PO TABS: 650.0000 mg | ORAL_TABLET | Freq: Four times a day (QID) | ORAL | Status: DC | PRN

## 2014-10-31 MED ORDER — HYDROCODONE-ACETAMINOPHEN 5-325 MG PO TABS: 1.0000 | ORAL_TABLET | ORAL | Status: DC | PRN

## 2014-10-31 MED ORDER — LATANOPROST 0.005 % OP SOLN
1.0000 [drp] | Freq: Every day | OPHTHALMIC | Status: DC
  Administered 2014-10-31 – 2014-11-02 (×3): 1 [drp] via OPHTHALMIC
  Filled 2014-10-31: qty 2.5

## 2014-10-31 MED ORDER — SODIUM CHLORIDE 0.9 % IV SOLN
INTRAVENOUS | Status: DC
  Administered 2014-10-31: 14:00:00 via INTRAVENOUS
  Administered 2014-11-01: 75 mL/h via INTRAVENOUS
  Administered 2014-11-02: 10:00:00 via INTRAVENOUS

## 2014-10-31 NOTE — H&P (Signed)
History and Physical  Katie Mosley [id number] DOB: 19-Aug-1941 DOA: 10/31/2014   PCP: PROVIDER NOT IN SYSTEM   Chief Complaint: abdominal pain/hematochezia  HPI:  74 year old female with a history of hypertension, esophageal stricture, hypothyroidism, GERD presents with 1 day history of worsening abdominal pain and hematochezia. The patient does not speak any [place]. This history is obtained from the patient's son at the bedside as well as review of the medical record. The patient normally struggles with constipation and has had intermittent lower abdominal pain for a "long time", but had an acute worsening on 10/30/2014. The patient had 2 episodes of hematochezia at home. There is Mosley no nausea, vomiting, hematemesis, fevers, chills, dysuria, hematuria. She denies any chest pain, shortness breath, palpitations, dizziness. The patient denies any NSAID use. The patient has never had a screening colonoscopy. CT of the abdomen and pelvis in emergency department revealed colonic wall thickening with pericolonic stranding involving the descending and sigmoid colon and including the rectum. There was also suggestion of left gonadal vein thrombosis. Labs revealed sodium 128, serum creatinine 1.03, WBC 11.7, hemoglobin 12.4. Urinalysis did not show any pyuria. Lactic acid was 0.67. The patient was started on Cipro and Flagyl and fluids in the emergency department. Assessment/Plan: Hematochezia with colitis -likely ischemic colitis -10/31/2014 CT abdomen and pelvis showed colonic wall thickening with pericolonic fat stranding involving the rectum, sigmoid, and descending colon. -Considerations include ischemia versus infection versus inflammatory -Patient has never had screening colonoscopy -Hemoglobin is stable -Patient is hemodynamically stable -Blood cultures 2 sets--please note that the cultures were ordered and obtained concomitantly while her antibiotics were infusing -consult  GI--spoke with Dr. 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 Cristina Gong -with very low risk of embolism even if it were to be thrombus, hold off on anticoagulation  -discussed findings with radiologist, Dr. Lowella Dell iliac v. Thrombus nor is there any IMV or SMV thrombus -findings may represent low flow state from the patient's colitis -therefore, favor tx colitis medically and possible repeat CT after pt is treated and stable in several weeks Hypertension -continue metoprolol succinate -hold HCTZ Hyponatremia -combination of volume depletion and HCTZ -IVF Hypothyroidism -continue synthroid GERD  -continue PPI       Past Medical History  Diagnosis Date  . Hypertension   . Hypothyroidism   . GERD (gastroesophageal reflux disease)    Past Surgical History  Procedure Laterality Date  . Capsulotomy  08/23/2011    Procedure: MINOR CAPSULOTOMY;  Surgeon: Myrtha Mantis.;  Location: [place];  Service: Ophthalmology;  Laterality: Left;  YAG Capsulotomy Left Eye  . Esophagogastroduodenoscopy N/A 03/17/2013    Procedure: ESOPHAGOGASTRODUODENOSCOPY (EGD);  Surgeon: Irene Shipper, MD;  Location: [facility] ENDOSCOPY;  Service: Endoscopy;  Laterality: N/A;   Social History:  reports that she does not drink alcohol or use illicit drugs. Her tobacco history is not on file.   History reviewed. No pertinent family history.   No Known Allergies    Prior to Admission medications   Medication Sig Start Date End Date Taking? Authorizing Provider  hydrochlorothiazide (HYDRODIURIL) 12.5 MG tablet Take 12.5 mg by mouth daily. 09/05/14  Yes Historical Provider, MD  lansoprazole (PREVACID) 30 MG capsule Take 30 mg by mouth daily.     Yes Historical Provider, MD  latanoprost Ivin Poot)  0.005 % ophthalmic solution Place 1 drop into the left eye at bedtime.     Yes Historical Provider, MD  levothyroxine (SYNTHROID, LEVOTHROID) 50 MCG tablet Take 50 mcg by mouth daily. 09/05/14  Yes Historical Provider, MD  loratadine (CLARITIN) 10 MG tablet Take 10 mg by mouth daily. allergies   Yes Historical Provider, MD  metoprolol succinate (TOPROL-XL) 50 MG 24 hr tablet Take 50 mg by mouth daily. 09/05/14  Yes Historical Provider, MD  PROAIR HFA 108 (90 BASE) MCG/ACT inhaler Inhale 1 puff into the lungs every 6 (six) hours as needed for wheezing.  09/16/14  Yes Historical Provider, MD  VESICARE 10 MG tablet Take 10 mg by mouth daily. 09/07/14  Yes Historical Provider, MD    Review of Systems:  Constitutional:  No weight loss, night sweats, Fevers, chills, fatigue.  Head&Eyes: No headache.  No vision loss.  No eye pain or scotoma ENT:  No Difficulty swallowing,Tooth/dental problems,Sore throat,   Cardio-vascular:  No chest pain, Orthopnea, PND, swelling in lower extremities,  dizziness, palpitations  GI:  No   nausea, vomiting, diarrhea, loss of appetite,  melena, heartburn, indigestion, Resp:  No shortness of breath with exertion or at rest. No cough. No coughing up of blood .No wheezing.No chest wall deformity  Skin:  no rash or lesions.  GU:  no dysuria, change in color of urine, no urgency or frequency. No flank pain.  Musculoskeletal:  No joint pain or swelling. No decreased range of motion. No back pain.  Psych:  No change in mood or affect. No depression or anxiety. Neurologic: No headache, no dysesthesia, no focal weakness, no vision loss. No syncope  Physical Exam: Filed Vitals:   10/31/14 0830 10/31/14 1046  BP: 193/77 162/76  Pulse: 67 67  Temp: 97.8 F (36.6 C)   TempSrc: Oral   Resp: 18 16  SpO2: 100% 94%   General:  A&O x 3, NAD, nontoxic, pleasant/cooperative Head/Eye: No conjunctival hemorrhage, no icterus, [place]/AT, No nystagmus ENT:  No icterus,  No  thrush, good dentition, no pharyngeal exudate Neck:  No masses, no lymphadenpathy, no bruits CV:  RRR, no rub, no gallop, no S3 Lung:  CTAB, good air movement, no wheeze, no rhonchi Abdomen: soft/LLQ, LUQ pain without rebound, +BS, nondistended, no peritoneal signs Ext: No cyanosis, No rashes, No petechiae, No lymphangitis, 1+LE edema   Labs on Admission:  Basic Metabolic Panel:  Recent Labs Lab 10/31/14 0856  NA 128*  K 4.0  CL 94*  CO2 27  GLUCOSE 102*  BUN 25*  CREATININE 1.03  CALCIUM 9.3   Liver Function Tests:  Recent Labs Lab 10/31/14 0856  AST 37  ALT 26  ALKPHOS 74  BILITOT 1.0  PROT 7.7  ALBUMIN 3.8    Recent Labs Lab 10/31/14 0856  LIPASE 18   No results for input(s): AMMONIA in the last 168 hours. CBC:  Recent Labs Lab 10/31/14 0856  WBC 11.7*  NEUTROABS 9.4*  HGB 12.4  HCT 35.4*  MCV 86.3  PLT 338   Cardiac Enzymes: No results for input(s): CKTOTAL, CKMB, CKMBINDEX, TROPONINI in the last 168 hours. BNP: Invalid input(s): POCBNP CBG: No results for input(s): GLUCAP in the last 168 hours.  Radiological Exams on Admission: Ct Abdomen Pelvis W Contrast  10/31/2014   CLINICAL DATA:  Initial encounter. Abdominal pain. Onset of symptoms 10/30/2014 at 1600 hours. Diarrhea. Bloody stool.  EXAM: CT ABDOMEN AND PELVIS WITH CONTRAST  TECHNIQUE: Multidetector CT imaging of the abdomen and  pelvis was performed using the standard protocol following bolus administration of intravenous contrast.  CONTRAST:  25mL OMNIPAQUE IOHEXOL 300 MG/ML SOLN, 65mL OMNIPAQUE IOHEXOL 300 MG/ML SOLN  COMPARISON:  03/24/2006.  FINDINGS: Musculoskeletal: Levoconvex lumbar scoliosis. Postsurgical changes in the lower lumbar spine with paraspinal muscular atrophy. No aggressive osseous lesions. No fracture. The SI joints remain open.  Lung Bases: Atelectasis.  Liver:  No mass lesion.  Old granulomatous disease.  Spleen:  Normal.  Gallbladder:  Phrygian cap configuration.  Common  bile duct:  Normal.  Pancreas:  Normal.  Adrenal glands:  Normal.  Kidneys: 1 cm LEFT inferior pole simple renal cysts. Normal enhancement and excretion of contrast bilaterally. No ureteral obstruction.  Stomach:  Collapsed, within normal limits.  Small bowel:  No small bowel obstruction or inflammatory changes.  Colon: Ileocecal valve and ascending colon appear normal. Transverse colon is almost completely collapsed. At the junction of the descending colon and sigmoid colon, there is mural thickening and mural edema that extends to the rectum. Hyper enhancing mucosa is present and fluid in the lumen. Stranding is present in the pericolonic fat.  Pelvic Genitourinary: Mild thickening of the urinary bladder is a chronic finding in this patient. Uterus and adnexa appear within normal limits.  Peritoneum: No free air.  No free fluid.  Vasculature: Atherosclerosis is present. There is enlargement and low attenuation centrally within the LEFT gonadal vein (image 54 series 2). 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 This is immediately adjacent to the inflamed sigmoid colon. The iliac veins appear within normal limits. Visible inferior mesenteric artery appears patent.  Body Wall: Normal.  IMPRESSION: 1. Contiguous colitis extending from the anus to the junction of the sigmoid and descending colon. Differential considerations are inflammatory bowel disease, infectious colitis with ischemia less likely. 2. Enlargement of the LEFT inferior gonadal vein with apparent central filling defect. Although this could be due to flow related artifact, the findings suggest gonadal vein thrombus. This is immediately adjacent to the inflamed sigmoid colon. 3. Nonspecific chronic bladder wall thickening.   Electronically Signed   By: Dereck Ligas M.D.   On: 10/31/2014 10:41       Time spent:70 minutes Code Status:   FULL Family Communication:   Son updated at bedside   Katie Mosley,  Katie Barga, DO  [facility] Pager [phone]  If 7PM-7AM, please contact night-coverage [email or web address] Password [facility] 10/31/2014, 12:34 PM

## 2014-10-31 NOTE — ED Notes (Signed)
Patient transported to CT 

## 2014-10-31 NOTE — ED Notes (Addendum)
Pt does not speak [place], family member interpreting. Pt speaks [place]. Family reports pt has had abdominal pain since 3/5 at 1600, with diarrhea, reports blood in stool, unsure amount. Yesterday pain 7/10. Today abdominal pain 2/10. Pt denies dysuria. Pt ambulatory. Hx of HTN and GERD

## 2014-10-31 NOTE — ED Provider Notes (Signed)
CSN: [id number]     Arrival date & time 10/31/14  0822 History   First MD Initiated Contact with Patient 10/31/14 0827     Chief Complaint  Patient presents with  . Abdominal Pain  . Diarrhea     (Consider location/radiation/quality/duration/timing/severity/associated sxs/prior Treatment) Patient is a 74 y.o. female presenting with abdominal pain and diarrhea. The history is provided by the patient and a relative.  Abdominal Pain Pain location:  LUQ and LLQ Pain quality: aching   Pain radiates to:  Does not radiate Pain severity:  Mild Onset quality:  Gradual Duration:  2 days Timing:  Constant Progression:  Worsening Chronicity:  New Context: not eating and not laxative use   Relieved by:  Nothing Worsened by:  Nothing tried Ineffective treatments:  None tried Associated symptoms: diarrhea and melena   Associated symptoms: no dysuria, no fever, no hematemesis, no nausea and no vomiting   Diarrhea Associated symptoms: abdominal pain   Associated symptoms: no fever and no vomiting     Past Medical History  Diagnosis Date  . Hypertension   . Hypothyroidism   . GERD (gastroesophageal reflux disease)    Past Surgical History  Procedure Laterality Date  . Capsulotomy  08/23/2011    Procedure: MINOR CAPSULOTOMY;  Surgeon: Myrtha Mantis.;  Location: [place];  Service: Ophthalmology;  Laterality: Left;  YAG Capsulotomy Left Eye  . Esophagogastroduodenoscopy N/A 03/17/2013    Procedure: ESOPHAGOGASTRODUODENOSCOPY (EGD);  Surgeon: Irene Shipper, MD;  Location:  [facility] ENDOSCOPY;  Service: Endoscopy;  Laterality: N/A;   History reviewed. No pertinent family history. History  Substance Use Topics  . Smoking status: Not on file  . Smokeless tobacco: Not on file  . Alcohol Use: No   OB History    No data available     Review of Systems  Constitutional: Negative for fever.  Gastrointestinal: Positive for abdominal pain, diarrhea and melena. Negative for nausea, vomiting and  hematemesis.  Genitourinary: Negative for dysuria.  All other systems reviewed and are negative.     Allergies  Review of patient's allergies indicates no known allergies.  Home Medications   Prior to Admission medications   Medication Sig Start Date End Date Taking? Authorizing Provider  PROAIR HFA 108 (90 BASE) MCG/ACT inhaler Inhale 1 puff into the lungs every 6 (six) hours as needed for wheezing.  09/16/14  Yes Historical Provider, MD  hydrochlorothiazide (HYDRODIURIL) 12.5 MG tablet Take 12.5 mg by mouth daily. 09/05/14   Historical Provider, MD  lansoprazole (PREVACID) 30 MG capsule Take 30 mg by mouth daily.      Historical Provider, MD  latanoprost (XALATAN) 0.005 % ophthalmic solution Place 1 drop into the left eye at bedtime.      Historical Provider, MD  levothyroxine (SYNTHROID, LEVOTHROID) 50 MCG tablet Take 50 mcg by mouth daily. 09/05/14   Historical Provider, MD  loratadine (CLARITIN) 10 MG tablet Take 10 mg by mouth daily. allergies    Historical Provider, MD  metoprolol succinate (TOPROL-XL) 50 MG 24 hr tablet Take 50 mg by mouth daily. 09/05/14   Historical Provider, MD  VESICARE 10 MG tablet Take 10 mg by mouth daily. 09/07/14   Historical Provider, MD   BP 193/77 mmHg  Pulse 67  Temp(Src) 97.8 F (36.6 C) (Oral)  Resp 18  SpO2 100% Physical Exam  Constitutional: She is oriented to person, place, and time. She appears well-developed and well-nourished.  Non-toxic appearance. No distress.  HENT:  Head: Normocephalic and atraumatic.  Eyes: Conjunctivae, EOM and lids are normal. Pupils are equal, round, and reactive to light.  Neck: Normal range of motion. Neck supple. No tracheal deviation present. No thyroid mass present.  Cardiovascular: Normal rate, regular rhythm and normal heart sounds.  Exam reveals no gallop.   No murmur heard. Pulmonary/Chest: Effort normal and breath sounds normal. No stridor. No respiratory distress. She has no decreased breath sounds. She  has no wheezes. She has no rhonchi. She has no rales.  Abdominal: Soft. Normal appearance and bowel sounds are normal. She exhibits no distension. There is no tenderness. There is no rebound and no CVA tenderness.    Musculoskeletal: Normal range of motion. She exhibits no edema or tenderness.  Neurological: She is alert and oriented to person, place, and time. She has normal strength. No cranial nerve deficit or sensory deficit. GCS eye subscore is 4. GCS verbal subscore is 5. GCS motor subscore is 6.  Skin: Skin is warm and dry. No abrasion and no rash noted.  Psychiatric: She has a normal mood and affect. Her speech is normal and behavior is normal.  Nursing note and vitals reviewed.   ED Course  Procedures (including critical care time) Labs Review Labs Reviewed  URINE CULTURE  CBC WITH DIFFERENTIAL/PLATELET  COMPREHENSIVE METABOLIC PANEL  LIPASE, BLOOD  URINALYSIS, ROUTINE W REFLEX MICROSCOPIC  I-STAT CG4 LACTIC ACID, ED  POC OCCULT BLOOD, ED    Imaging Review No results found.   EKG Interpretation None      MDM   Final diagnoses:  Abdominal pain    Patient started on antibiotics for class and will be admitted by the medicine service    Leota Jacobsen, MD 10/31/14 1110

## 2014-10-31 NOTE — Consult Note (Signed)
Referring Provider: Dr. Shanon Brow Tat ([facility]) Primary Care Physician:  Dr. Nolene Ebbs Primary Gastroenterologist:  None (unassigned)  Reason for Consultation:  Colitis  HPI: Katie Mosley is a 74 y.o. female [place] being admitted through the hospital today because of a roughly 24 hour history of lower GI symptoms, characterized by diarrhea yesterday afternoon, that became bloody yesterday evening.  The patient's son is at the bedside and provides most of the history, since the patient herself speaks essentially no [place].  The patient was having a bowel movement roughly every 30 minutes, with small amounts of blood present. She has also had some lower abdominal pain although this does not seem to be a major complaint for her. In any event, she is feeling substantially better today.   An abdominal pelvic CT was obtained on presentation to the emergency room, which shows segmental colitis involving the distal descending colon and sigmoid colon. Incidental notation was made of possible thrombosis and an adjacent gonadal vein. Atherosclerotic changes were seen.  She has never had colonoscopy, and there is no known family history of gastrointestinal disorders.   Past Medical History  Diagnosis Date  . Hypertension   . Hypothyroidism   . GERD (gastroesophageal reflux disease)     Past Surgical History  Procedure Laterality Date  . Capsulotomy  08/23/2011    Procedure: MINOR CAPSULOTOMY;  Surgeon: Myrtha Mantis.;  Location: [place];  Service: Ophthalmology;  Laterality: Left;  YAG Capsulotomy Left Eye  . Esophagogastroduodenoscopy N/A 03/17/2013    Procedure: ESOPHAGOGASTRODUODENOSCOPY (EGD);  Surgeon: Irene Shipper, MD;  Location[facility] ENDOSCOPY;  Service: Endoscopy;  Laterality: N/A;    Prior to Admission medications   Medication Sig Start Date End Date Taking? Authorizing Provider  hydrochlorothiazide (HYDRODIURIL) 12.5 MG tablet Take 12.5 mg by mouth  daily. 09/05/14  Yes Historical Provider, MD  lansoprazole (PREVACID) 30 MG capsule Take 30 mg by mouth daily.     Yes Historical Provider, MD  latanoprost (XALATAN) 0.005 % ophthalmic solution Place 1 drop into the left eye at bedtime.     Yes Historical Provider, MD  levothyroxine (SYNTHROID, LEVOTHROID) 50 MCG tablet Take 50 mcg by mouth daily. 09/05/14  Yes Historical Provider, MD  loratadine (CLARITIN) 10 MG tablet Take 10 mg by mouth daily. allergies   Yes Historical Provider, MD  metoprolol succinate (TOPROL-XL) 50 MG 24 hr tablet Take 50 mg by mouth daily. 09/05/14  Yes Historical Provider, MD  PROAIR HFA 108 (90 BASE) MCG/ACT inhaler Inhale 1 puff into the lungs every 6 (six) hours as needed for wheezing.  09/16/14  Yes Historical Provider, MD  VESICARE 10 MG tablet Take 10 mg by mouth daily. 09/07/14  Yes Historical Provider, MD    Current Facility-Administered Medications  Medication Dose Route Frequency Provider Last Rate Last Dose  . 0.9 %  sodium chloride infusion   Intravenous Continuous Leota Jacobsen, MD 125 mL/hr at 10/31/14 [phone]     Current Outpatient Prescriptions  Medication Sig Dispense Refill  . hydrochlorothiazide (HYDRODIURIL) 12.5 MG tablet Take 12.5 mg by mouth daily.  2  . lansoprazole (PREVACID) 30 MG capsule Take 30 mg by mouth daily.      Marland Kitchen latanoprost (XALATAN) 0.005 % ophthalmic solution Place 1 drop into the left eye at bedtime.      Marland Kitchen levothyroxine (SYNTHROID, LEVOTHROID) 50 MCG tablet Take 50 mcg by mouth daily.  2  . loratadine (CLARITIN) 10 MG tablet Take 10 mg by mouth daily.  allergies    . metoprolol succinate (TOPROL-XL) 50 MG 24 hr tablet Take 50 mg by mouth daily.  2  . PROAIR HFA 108 (90 BASE) MCG/ACT inhaler Inhale 1 puff into the lungs every 6 (six) hours as needed for wheezing.   5  . VESICARE 10 MG tablet Take 10 mg by mouth daily.  3    Allergies as of 10/31/2014  . (No Known Allergies)    History reviewed. No pertinent family  history.  History   Social History  . Marital Status: Widowed    Spouse Name: N/A  . Number of Children: N/A  . Years of Education: N/A   Occupational History  . Not on file.   Social History Main Topics  . Smoking status: Not on file  . Smokeless tobacco: Not on file  . Alcohol Use: No  . Drug Use: No  . Sexual Activity: No   Other Topics Concern  . Not on file   Social History Narrative    Review of Systems: Negative for anorexia, weight loss, or previous episodes of this type. She does have heartburn which is well controlled by the outpatient use of Prevacid. She has had a previous food impaction with foreign body endoscopic retrieval by Dr. Scarlette Shorts in July 2014. She has chronic constipation for which she uses MiraLAX and sometimes prune juice  Physical Exam: Vital signs in last 24 hours: Temp:  [97.8 F (36.6 C)] 97.8 F (36.6 C) (03/06 0830) Pulse Rate:  [67-68] 68 (03/06 1253) Resp:  [16-18] 16 (03/06 1253) BP: (162-193)/(57-77) 162/57 mmHg (03/06 1253) SpO2:  [94 %-100 %] 95 % (03/06 1253)   General:   Alert,  Well-developed, well-nourished, pleasant and cooperative in NAD Head:  Normocephalic and atraumatic. Eyes:  Sclera clear, no icterus.    Mouth:   No ulcerations or lesions.  Oropharynx pink & moist. Neck:   No masses or thyromegaly. Lungs:  Clear throughout to auscultation.   No wheezes, crackles, or rhonchi. No evident respiratory distress. Heart:   Regular rate and rhythm; no murmurs, clicks, rubs,  or gallops. Abdomen:  Soft, nontender, nontympanitic, and nondistended. No masses, hepatosplenomegaly or ventral hernias noted. Quiet bowel sounds, without bruits, guarding, or rebound.   Rectal:  Not performed by me, but she was noted to be Hemoccult-positive   Msk:   Symmetrical without gross deformities. Pulses:  Normal radial pulse noted. Extremities:   Without clubbing or cyanosis; mild lower extremity edema present. Neurologic:  Alert and coherent;   grossly normal neurologically. Skin: Warm and dry. Intact without significant lesions or rashes. Cervical Nodes:  No significant cervical adenopathy. Psych:   Alert and cooperative. Normal mood and affect.  Intake/Output from previous day:   Intake/Output this shift:    Lab Results:  Recent Labs  10/31/14 0856  WBC 11.7*  HGB 12.4  HCT 35.4*  PLT 338   BMET  Recent Labs  10/31/14 0856  NA 128*  K 4.0  CL 94*  CO2 27  GLUCOSE 102*  BUN 25*  CREATININE 1.03  CALCIUM 9.3   LFT  Recent Labs  10/31/14 0856  PROT 7.7  ALBUMIN 3.8  AST 37  ALT 26  ALKPHOS 74  BILITOT 1.0   PT/INR No results for input(s): LABPROT, INR in the last 72 hours.  Studies/Results: Ct Abdomen Pelvis W Contrast  10/31/2014   CLINICAL DATA:  Initial encounter. Abdominal pain. Onset of symptoms 10/30/2014 at 1600 hours. Diarrhea. Bloody stool.  EXAM: CT  ABDOMEN AND PELVIS WITH CONTRAST  TECHNIQUE: Multidetector CT imaging of the abdomen and pelvis was performed using the standard protocol following bolus administration of intravenous contrast.  CONTRAST:  58mL OMNIPAQUE IOHEXOL 300 MG/ML SOLN, 34mL OMNIPAQUE IOHEXOL 300 MG/ML SOLN  COMPARISON:  03/24/2006.  FINDINGS: Musculoskeletal: Levoconvex lumbar scoliosis. Postsurgical changes in the lower lumbar spine with paraspinal muscular atrophy. No aggressive osseous lesions. No fracture. The SI joints remain open.  Lung Bases: Atelectasis.  Liver:  No mass lesion.  Old granulomatous disease.  Spleen:  Normal.  Gallbladder:  Phrygian cap configuration.  Common bile duct:  Normal.  Pancreas:  Normal.  Adrenal glands:  Normal.  Kidneys: 1 cm LEFT inferior pole simple renal cysts. Normal enhancement and excretion of contrast bilaterally. No ureteral obstruction.  Stomach:  Collapsed, within normal limits.  Small bowel:  No small bowel obstruction or inflammatory changes.  Colon: Ileocecal valve and ascending colon appear normal. Transverse colon is almost  completely collapsed. At the junction of the descending colon and sigmoid colon, there is mural thickening and mural edema that extends to the rectum. Hyper enhancing mucosa is present and fluid in the lumen. Stranding is present in the pericolonic fat.  Pelvic Genitourinary: Mild thickening of the urinary bladder is a chronic finding in this patient. Uterus and adnexa appear within normal limits.  Peritoneum: No free air.  No free fluid.  Vasculature: Atherosclerosis is present. There is enlargement and low attenuation centrally within the LEFT gonadal vein (image 54 series 2). 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 This is immediately adjacent to the inflamed sigmoid colon. The iliac veins appear within normal limits. Visible inferior mesenteric artery appears patent.  Body Wall: Normal.  IMPRESSION: 1. Contiguous colitis extending from the anus to the junction of the sigmoid and descending colon. Differential considerations are inflammatory bowel disease, infectious colitis with ischemia less likely. 2. Enlargement of the LEFT inferior gonadal vein with apparent central filling defect. Although this could be due to flow related artifact, the findings suggest gonadal vein thrombus. This is immediately adjacent to the inflamed sigmoid colon. 3. Nonspecific chronic bladder wall thickening.   Electronically Signed   By: Dereck Ligas M.D.   On: 10/31/2014 10:41    Impression: I think the overall picture is most consistent with ischemic colitis involving the sigmoid region. This would be compatible with the fairly abrupt onset, the segmental involvement of the colon, and the presence of diarrhea that became bloody over time. It appears to be clinically mild to moderate in severity, and already seems to be resolving.  Plan: I would favor observation and supportive care, with IV fluids, bowel rest (clear liquids okay), and pain medication if needed.   The role of  antibiotics in this setting is optional. The patient is not clinically toxic and generally I avoid the use of antibiotics for patients with what appears to be mild ischemic colitis, although they are commonly used in this setting. If antibiotics are used, I would favor concurrent probiotic therapy to help prevent hospital acquired C. difficile infection.  If the patient makes a rapid clinical resolution, I do not think urgent colonoscopy is needed, although the patient would be a good candidate for an outpatient elective colonoscopy to confirm healing, exclude chronic diseases such as chronic mucosal ischemia, and for colon cancer screening purposes although she has emigrated from a low prevalence area with respect to colon cancer. On the other hand, if diagnostic uncertainty  persists and/or if the patient's clinical course is not favorable over the next day or 2, diagnostic colonoscopy could certainly be considered while still in the hospital.   LOS: 0 days   Kerstyn Coryell V  10/31/2014, 12:56 PM

## 2014-11-01 ENCOUNTER — Encounter (HOSPITAL_COMMUNITY): Payer: Self-pay

## 2014-11-01 LAB — CBC
HEMATOCRIT: 30.5 % — AB (ref 36.0–46.0)
Hemoglobin: 10.6 g/dL — ABNORMAL LOW (ref 12.0–15.0)
MCH: 30.1 pg (ref 26.0–34.0)
MCHC: 34.8 g/dL (ref 30.0–36.0)
MCV: 86.6 fL (ref 78.0–100.0)
Platelets: 267 10*3/uL (ref 150–400)
RBC: 3.52 MIL/uL — ABNORMAL LOW (ref 3.87–5.11)
RDW: 13.4 % (ref 11.5–15.5)
WBC: 7.1 10*3/uL (ref 4.0–10.5)

## 2014-11-01 LAB — BASIC METABOLIC PANEL
Anion gap: 5 (ref 5–15)
BUN: 14 mg/dL (ref 6–23)
CALCIUM: 8.4 mg/dL (ref 8.4–10.5)
CO2: 24 mmol/L (ref 19–32)
CREATININE: 0.88 mg/dL (ref 0.50–1.10)
Chloride: 99 mmol/L (ref 96–112)
GFR calc Af Amer: 73 mL/min — ABNORMAL LOW (ref >90)
GFR calc non Af Amer: 63 mL/min — ABNORMAL LOW (ref >90)
GLUCOSE: 91 mg/dL (ref 70–99)
Potassium: 3.9 mmol/L (ref 3.5–5.1)
Sodium: 128 mmol/L — ABNORMAL LOW (ref 135–145)

## 2014-11-01 LAB — URINE CULTURE
Colony Count: NO GROWTH
Culture: NO GROWTH

## 2014-11-01 MED ORDER — SACCHAROMYCES BOULARDII 250 MG PO CAPS
250.0000 mg | ORAL_CAPSULE | Freq: Two times a day (BID) | ORAL | Status: DC
  Administered 2014-11-01 – 2014-11-03 (×4): 250 mg via ORAL
  Filled 2014-11-01 (×5): qty 1

## 2014-11-01 NOTE — Progress Notes (Signed)
[facility] PROGRESS NOTE Subjective information obtained with granddaughter at the bedside acting as interpreter. Patient not have any abdominal pain today taking small amounts of clear liquids.  Objective: Vital signs in last 24 hours: Temp:  [97.8 F (36.6 C)-98.2 F (36.8 C)] 97.9 F (36.6 C) (03/07 0455) Pulse Rate:  [67-72] 71 (03/07 0455) Resp:  [16-18] 16 (03/07 0455) BP: (162-193)/(55-83) 172/79 mmHg (03/07 0455) SpO2:  [94 %-100 %] 100 % (03/07 0455) Weight:  [58.06 kg (128 lb)] 58.06 kg (128 lb) (03/06 1330) Last BM Date: 10/31/14  Intake/Output from previous day: 03/06 0701 - 03/07 0700 In: 1880 [P.O.:600; I.V.:1225; IV Piggyback:55] Out: [id number] [Urine:950; Stool:1] Intake/Output this shift:    PE: General--Pleasant female in no acute distress  Abdomen-- nondistended few bowel sounds, minimal tenderness  Lab Results:  Recent Labs  10/31/14 0856 11/01/14 0506  WBC 11.7* 7.1  HGB 12.4 10.6*  HCT 35.4* 30.5*  PLT 338 267   BMET  Recent Labs  10/31/14 0856 11/01/14 0506  NA 128* 128*  K 4.0 3.9  CL 94* 99  CO2 27 24  CREATININE 1.03 0.88   LFT  Recent Labs  10/31/14 0856  PROT 7.7  AST 37  ALT 26  ALKPHOS 74  BILITOT 1.0   PT/INR  Recent Labs  10/31/14 1448  LABPROT 13.8  INR 1.05   PANCREAS  Recent Labs  10/31/14 0856  LIPASE 18         Studies/Results: Ct Abdomen Pelvis W Contrast  10/31/2014   CLINICAL DATA:  Initial encounter. Abdominal pain. Onset of symptoms 10/30/2014 at 1600 hours. Diarrhea. Bloody stool.  EXAM: CT ABDOMEN AND PELVIS WITH CONTRAST  TECHNIQUE: Multidetector CT imaging of the abdomen and pelvis was performed using the standard protocol following bolus administration of intravenous contrast.  CONTRAST:  61mL OMNIPAQUE IOHEXOL 300 MG/ML SOLN, 59mL OMNIPAQUE IOHEXOL 300 MG/ML SOLN  COMPARISON:  03/24/2006.  FINDINGS: Musculoskeletal: Levoconvex lumbar scoliosis. Postsurgical changes in the lower  lumbar spine with paraspinal muscular atrophy. No aggressive osseous lesions. No fracture. The SI joints remain open.  Lung Bases: Atelectasis.  Liver:  No mass lesion.  Old granulomatous disease.  Spleen:  Normal.  Gallbladder:  Phrygian cap configuration.  Common bile duct:  Normal.  Pancreas:  Normal.  Adrenal glands:  Normal.  Kidneys: 1 cm LEFT inferior pole simple renal cysts. Normal enhancement and excretion of contrast bilaterally. No ureteral obstruction.  Stomach:  Collapsed, within normal limits.  Small bowel:  No small bowel obstruction or inflammatory changes.  Colon: Ileocecal valve and ascending colon appear normal. Transverse colon is almost completely collapsed. At the junction of the descending colon and sigmoid colon, there is mural thickening and mural edema that extends to the rectum. Hyper enhancing mucosa is present and fluid in the lumen. Stranding is present in the pericolonic fat.  Pelvic Genitourinary: Mild thickening of the urinary bladder is a chronic finding in this patient. Uterus and adnexa appear within normal limits.  Peritoneum: No free air.  No free fluid.  Vasculature: Atherosclerosis is present. There is enlargement and low attenuation centrally within the LEFT gonadal vein (image 54 series 2). 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 This is immediately adjacent to the inflamed sigmoid colon. The iliac veins appear within normal limits. Visible inferior mesenteric artery appears patent.  Body Wall: Normal.  IMPRESSION: 1. Contiguous colitis extending from the anus to the junction of the sigmoid and descending colon.  Differential considerations are inflammatory bowel disease, infectious colitis with ischemia less likely. 2. Enlargement of the LEFT inferior gonadal vein with apparent central filling defect. Although this could be due to flow related artifact, the findings suggest gonadal vein thrombus. This is immediately adjacent to  the inflamed sigmoid colon. 3. Nonspecific chronic bladder wall thickening.   Electronically Signed   By: Dereck Ligas M.D.   On: 10/31/2014 10:41    Medications: I have reviewed the patient's current medications.  Assessment/Plan: 1. Segmental colitis/hematochezia. This is almost certainly ischemic colitis in the 74 year old woman and her symptoms to appear to be resolving. She does seem to be tolerating clear liquids and I would continue this. If she is doing well tomorrow, advanced full liquid diet. Issues whether to attempt colonoscopy now or allow her to recover from this can consider colonoscopy as outpatient at some point in the future.    Dalis Beers JR,Forrest Demuro L 11/01/2014, 8:24 AM

## 2014-11-01 NOTE — Progress Notes (Signed)
[facility] PROGRESS NOTE  Katie Mosley [id number] DOB: 25-Aug-1941 DOA: 10/31/2014 PCP: PROVIDER NOT IN SYSTEM  Assessment/Plan: #1 segmental ischemic colitis/hematochezia Likely secondary to ischemic colitis. Clinical improvement. Patient tolerating clear liquids. Patient states less blood in the stools. HCTZ on hold. Continue supportive care, IV fluids, continue IV antibiotics for now. Follow. GI following and appreciate input and recommendations.  #2 hypertension HCTZ on hold secondary to problem #1. Continue current regimen of metoprolol.  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 Admitting physician discussed case with GI and feels this is more likely an ischemic colitis. Patient had very low risk for embolism even if there were thrombosed would need to hold off on anticoagulation. Admitting M.D., Katie Mosley spoke with radiologist, Katie Mosley who felt there was no iliac vein thrombosis no was they any IMV or SMV thrombus and findings likely a low flow state due to patient's colitis. Continue treatment for colitis for now and may consider repeat CT scan in several weeks for follow-up.  #4 hyponatremia Likely secondary to volume depletion in the setting of diuretics. Continue IV fluids. Follow.  #5 hypothyroidism Continue Synthroid.  #6 gastroesophageal reflux disease PPI.  Code Status: Full Family Communication: Updated patient and grand daughter at bedside was acting as interpreter. Disposition Plan: Home when medically stable.   Consultants:  Gastroenterology: Katie Mosley 10/31/2014.  Procedures:  CT abdomen and pelvis 10/31/2014    Antibiotics:  IV Zosyn 10/31/2014  HPI/Subjective: Patient states abdominal pain has improved. Patient states less blood with bowel movements. Patient tolerating current clear liquids.  Objective: Filed Vitals:   11/01/14 0455  BP: 172/79  Pulse: 71  Temp: 97.9 F (36.6 C)  Resp: 16    Intake/Output Summary (Last 24 hours) at 11/01/14 1143 Last data filed at 10/31/14 2000  Gross per 24 hour  Intake   1880 ml  Output    951 ml  Net    929 ml   Filed Weights   10/31/14 1330  Weight: 58.06 kg (128 lb)    Exam:   General:  NAD  Cardiovascular: RRR  Respiratory: CTAB  Abdomen: Soft, nontender, nondistended, positive bowel sounds.  Musculoskeletal: No clubbing cyanosis or edema.  Data Reviewed: Basic Metabolic Panel:  Recent Labs Lab 10/31/14 0856 11/01/14 0506  NA 128* 128*  K 4.0 3.9  CL 94* 99  CO2 27 24  GLUCOSE 102* 91  BUN 25* 14  CREATININE 1.03 0.88  CALCIUM 9.3 8.4   Liver Function Tests:  Recent Labs Lab 10/31/14 0856  AST 37  ALT 26  ALKPHOS 74  BILITOT 1.0  PROT 7.7  ALBUMIN 3.8    Recent Labs Lab 10/31/14 0856  LIPASE 18   No results for input(s): AMMONIA in the last 168 hours. CBC:  Recent Labs Lab 10/31/14 0856 11/01/14 0506  WBC 11.7* 7.1  NEUTROABS 9.4*  --   HGB 12.4 10.6*  HCT 35.4* 30.5*  MCV 86.3 86.6  PLT 338 267   Cardiac Enzymes: No results for input(s): CKTOTAL, CKMB, CKMBINDEX, TROPONINI in the last 168 hours. BNP (last 3 results) No results for input(s): BNP in the last 8760 hours.  ProBNP (last 3 results) No results for input(s): PROBNP in the last 8760 hours.  CBG: No results for input(s): GLUCAP in the last 168 hours.  No results found for this or any previous visit (from the past 240 hour(s)).  Studies: Ct Abdomen Pelvis W Contrast  10/31/2014   CLINICAL DATA:  Initial encounter. Abdominal pain. Onset of symptoms 10/30/2014 at 1600 hours. Diarrhea. Bloody stool.  EXAM: CT ABDOMEN AND PELVIS WITH CONTRAST  TECHNIQUE: Multidetector CT imaging of the abdomen and pelvis was performed using the standard protocol following bolus administration of intravenous contrast.  CONTRAST:  77mL OMNIPAQUE IOHEXOL 300 MG/ML  SOLN, 73mL OMNIPAQUE IOHEXOL 300 MG/ML SOLN  COMPARISON:  03/24/2006.  FINDINGS: Musculoskeletal: Levoconvex lumbar scoliosis. Postsurgical changes in the lower lumbar spine with paraspinal muscular atrophy. No aggressive osseous lesions. No fracture. The SI joints remain open.  Lung Bases: Atelectasis.  Liver:  No mass lesion.  Old granulomatous disease.  Spleen:  Normal.  Gallbladder:  Phrygian cap configuration.  Common bile duct:  Normal.  Pancreas:  Normal.  Adrenal glands:  Normal.  Kidneys: 1 cm LEFT inferior pole simple renal cysts. Normal enhancement and excretion of contrast bilaterally. No ureteral obstruction.  Stomach:  Collapsed, within normal limits.  Small bowel:  No small bowel obstruction or inflammatory changes.  Colon: Ileocecal valve and ascending colon appear normal. Transverse colon is almost completely collapsed. At the junction of the descending colon and sigmoid colon, there is mural thickening and mural edema that extends to the rectum. Hyper enhancing mucosa is present and fluid in the lumen. Stranding is present in the pericolonic fat.  Pelvic Genitourinary: Mild thickening of the urinary bladder is a chronic finding in this patient. Uterus and adnexa appear within normal limits.  Peritoneum: No free air.  No free fluid.  Vasculature: Atherosclerosis is present. There is enlargement and low attenuation centrally within the LEFT gonadal vein (image 54 series 2). 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 This is immediately adjacent to the inflamed sigmoid colon. The iliac veins appear within normal limits. Visible inferior mesenteric artery appears patent.  Body Wall: Normal.  IMPRESSION: 1. Contiguous colitis extending from the anus to the junction of the sigmoid and descending colon. Differential considerations are inflammatory bowel disease, infectious colitis with ischemia less likely. 2. Enlargement of the LEFT inferior gonadal vein with  apparent central filling defect. Although this could be due to flow related artifact, the findings suggest gonadal vein thrombus. This is immediately adjacent to the inflamed sigmoid colon. 3. Nonspecific chronic bladder wall thickening.   Electronically Signed   By: Dereck Ligas M.D.   On: 10/31/2014 10:41    Scheduled Meds: . darifenacin  7.5 mg Oral Daily  . latanoprost  1 drop Left Eye QHS  . levothyroxine  50 mcg Oral QAC breakfast  . metoprolol succinate  50 mg Oral Daily  . pantoprazole  40 mg Oral Daily  . piperacillin-tazobactam  3.375 g Intravenous 3 times per day   Continuous Infusions: . sodium chloride 75 mL/hr (11/01/14 [id number])    Principal Problem:   Ischemic colitis Active Problems:   Hyponatremia   Benign essential HTN   Hypothyroidism    Time spent: 52 mins    [facility] MD [facility] Pager [phone]. If 7PM-7AM, please contact night-coverage at [email or web address], password [facility] 11/01/2014, 11:43 AM  LOS: 1 day

## 2014-11-02 LAB — CBC
HEMATOCRIT: 29.6 % — AB (ref 36.0–46.0)
Hemoglobin: 10.3 g/dL — ABNORMAL LOW (ref 12.0–15.0)
MCH: 30.3 pg (ref 26.0–34.0)
MCHC: 34.8 g/dL (ref 30.0–36.0)
MCV: 87.1 fL (ref 78.0–100.0)
Platelets: 292 10*3/uL (ref 150–400)
RBC: 3.4 MIL/uL — AB (ref 3.87–5.11)
RDW: 13.4 % (ref 11.5–15.5)
WBC: 4.7 10*3/uL (ref 4.0–10.5)

## 2014-11-02 LAB — BASIC METABOLIC PANEL
Anion gap: 8 (ref 5–15)
BUN: 10 mg/dL (ref 6–23)
CO2: 24 mmol/L (ref 19–32)
Calcium: 8.8 mg/dL (ref 8.4–10.5)
Chloride: 98 mmol/L (ref 96–112)
Creatinine, Ser: 0.93 mg/dL (ref 0.50–1.10)
GFR calc Af Amer: 68 mL/min — ABNORMAL LOW (ref >90)
GFR, EST NON AFRICAN AMERICAN: 59 mL/min — AB (ref >90)
Glucose, Bld: 92 mg/dL (ref 70–99)
Potassium: 3.9 mmol/L (ref 3.5–5.1)
SODIUM: 130 mmol/L — AB (ref 135–145)

## 2014-11-02 LAB — MAGNESIUM: MAGNESIUM: 1.6 mg/dL (ref 1.5–2.5)

## 2014-11-02 MED ORDER — AMLODIPINE BESYLATE 5 MG PO TABS
5.0000 mg | ORAL_TABLET | Freq: Every day | ORAL | Status: DC
  Administered 2014-11-02 – 2014-11-03 (×2): 5 mg via ORAL
  Filled 2014-11-02 (×2): qty 1

## 2014-11-02 MED ORDER — MAGNESIUM SULFATE 2 GM/50ML IV SOLN
2.0000 g | Freq: Once | INTRAVENOUS | Status: AC
  Administered 2014-11-02: 2 g via INTRAVENOUS
  Filled 2014-11-02: qty 50

## 2014-11-02 NOTE — Progress Notes (Signed)
[facility] PROGRESS NOTE  DIOSELYN KRETSCH [id number] DOB: [birth year] DOA: 10/31/2014 PCP: PROVIDER NOT IN SYSTEM  Assessment/Plan: #1 segmental ischemic colitis/hematochezia Likely secondary to ischemic colitis. Clinical improvement. Patient tolerating low residue diet. Patient denies any further hematochezia. Patient denies any further abdominal pain. HCTZ on hold. Continue supportive care, IV fluids. Discontinue IV antibiotics for now. Follow. GI following and appreciate input and recommendations.  #2 hypertension HCTZ on hold secondary to problem #1. Continue current regimen of metoprolol. Will add low-dose Norvasc.  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 Admitting physician discussed case with GI and feels this is more likely an ischemic colitis. Patient had very low risk for embolism even if there were thrombosed would need to hold off on anticoagulation. Admitting M.D., Dr. Carles Collet spoke with radiologist, Dr. Carron Curie who felt there was no iliac vein thrombosis no was they any IMV or SMV thrombus and findings likely a low flow state due to patient's colitis. Continue treatment for colitis for now and may consider repeat CT scan in several weeks for follow-up.  #4 hyponatremia Likely secondary to volume depletion in the setting of diuretics. Normal saline lock IV fluids. Follow.  #5 hypothyroidism Continue Synthroid.  #6 gastroesophageal reflux disease PPI.  Code Status: Full Family Communication: Updated patient and grand daughter at bedside was acting as interpreter. Disposition Plan: Home when medically stable, hopefully tomorrow.   Consultants:  Gastroenterology: Dr. Cristina Gong 10/31/2014.  Procedures:  CT abdomen and pelvis 10/31/2014    Antibiotics:  IV Zosyn 10/31/2014>>>>>>> 11/02/2014  HPI/Subjective: Patient states abdominal pain has improved.  Patient states less blood with bowel movements. Patient tolerating current low residue diet.  Objective: Filed Vitals:   11/02/14 1035  BP: 189/55  Pulse: 61  Temp:   Resp:     Intake/Output Summary (Last 24 hours) at 11/02/14 1525 Last data filed at 11/02/14 1000  Gross per 24 hour  Intake   1151 ml  Output   1825 ml  Net   -674 ml   Filed Weights   10/31/14 1330  Weight: 58.06 kg (128 lb)    Exam:   General:  NAD  Cardiovascular: RRR  Respiratory: CTAB  Abdomen: Soft, nontender, nondistended, positive bowel sounds.  Musculoskeletal: No clubbing cyanosis or edema.  Data Reviewed: Basic Metabolic Panel:  Recent Labs Lab 10/31/14 0856 11/01/14 0506 11/02/14 0534  NA 128* 128* 130*  K 4.0 3.9 3.9  CL 94* 99 98  CO2 27 24 24   GLUCOSE 102* 91 92  BUN 25* 14 10  CREATININE 1.03 0.88 0.93  CALCIUM 9.3 8.4 8.8  MG  --   --  1.6   Liver Function Tests:  Recent Labs Lab 10/31/14 0856  AST 37  ALT 26  ALKPHOS 74  BILITOT 1.0  PROT 7.7  ALBUMIN 3.8    Recent Labs Lab 10/31/14 0856  LIPASE 18   No results for input(s): AMMONIA in the last 168 hours. CBC:  Recent Labs Lab 10/31/14 0856 11/01/14 0506 11/02/14 0534  WBC 11.7* 7.1 4.7  NEUTROABS 9.4*  --   --   HGB 12.4 10.6* 10.3*  HCT 35.4* 30.5* 29.6*  MCV 86.3 86.6 87.1  PLT 338 267 292   Cardiac Enzymes: No results for input(s): CKTOTAL, CKMB, CKMBINDEX, TROPONINI in the last 168 hours. BNP (last 3 results) No results for input(s): BNP in the last 8760 hours.  ProBNP (  last 3 results) No results for input(s): PROBNP in the last 8760 hours.  CBG: No results for input(s): GLUCAP in the last 168 hours.  Recent Results (from the past 240 hour(s))  Urine culture     Status: None   Collection Time: 10/31/14  9:44 AM  Result Value Ref Range Status   Specimen Description URINE, CATHETERIZED  Final   Special Requests NONE  Final   Colony Count NO GROWTH Performed at [facility]   Final   Culture NO GROWTH Performed at [facility]   Final   Report Status 11/01/2014 FINAL  Final  Culture, blood (routine x 2)     Status: None (Preliminary result)   Collection Time: 10/31/14 11:29 AM  Result Value Ref Range Status   Specimen Description BLOOD RIGHT ANTECUBITAL  Final   Special Requests BOTTLES DRAWN AEROBIC AND ANAEROBIC 3 CC EACH  Final   Culture   Final           BLOOD CULTURE RECEIVED NO GROWTH TO DATE CULTURE WILL BE HELD FOR 5 DAYS BEFORE ISSUING A FINAL NEGATIVE REPORT Performed at [facility]    Report Status PENDING  Incomplete  Culture, blood (routine x 2)     Status: None (Preliminary result)   Collection Time: 10/31/14 11:42 AM  Result Value Ref Range Status   Specimen Description BLOOD RIGHT HAND  Final   Special Requests BOTTLES DRAWN AEROBIC AND ANAEROBIC 3CC EACH  Final   Culture   Final           BLOOD CULTURE RECEIVED NO GROWTH TO DATE CULTURE WILL BE HELD FOR 5 DAYS BEFORE ISSUING A FINAL NEGATIVE REPORT Performed at [facility]    Report Status PENDING  Incomplete     Studies: No results found.  Scheduled Meds: . darifenacin  7.5 mg Oral Daily  . latanoprost  1 drop Left Eye QHS  . levothyroxine  50 mcg Oral QAC breakfast  . metoprolol succinate  50 mg Oral Daily  . pantoprazole  40 mg Oral Daily  . saccharomyces boulardii  250 mg Oral BID   Continuous Infusions:    Principal Problem:   Ischemic colitis Active Problems:   Hyponatremia   Benign essential HTN   Hypothyroidism    Time spent: 41 mins    [facility] MD [facility] Pager [phone]. If 7PM-7AM, please contact night-coverage at [email or web address], password [facility] 11/02/2014, 3:25 PM  LOS: 2 days

## 2014-11-02 NOTE — Progress Notes (Signed)
[facility] PROGRESS NOTE Subjective No BM, no pain  Objective: Vital signs in last 24 hours: Temp:  [98.7 F (37.1 C)-98.9 F (37.2 C)] 98.9 F (37.2 C) (03/08 0530) Pulse Rate:  [62-66] 66 (03/08 0530) Resp:  [16-18] 16 (03/08 0530) BP: (161-183)/(50-68) 183/58 mmHg (03/08 0530) SpO2:  [98 %-100 %] 98 % (03/08 0530) Last BM Date: 10/31/14  Intake/Output from previous day: 03/07 0701 - 03/08 0700 In: 1751 [P.O.:1200; I.V.:451; IV Piggyback:100] Out: 1850 [Urine:1850] Intake/Output this shift: Total I/O In: -  Out: 375 [Urine:375]  PE: General--alert no distress  Abdomen--nondistended, nontender, good BSs  Lab Results:  Recent Labs  10/31/14 0856 11/01/14 0506 11/02/14 0534  WBC 11.7* 7.1 4.7  HGB 12.4 10.6* 10.3*  HCT 35.4* 30.5* 29.6*  PLT 338 267 292   BMET  Recent Labs  10/31/14 0856 11/01/14 0506 11/02/14 0534  NA 128* 128* 130*  K 4.0 3.9 3.9  CL 94* 99 98  CO2 27 24 24   CREATININE 1.03 0.88 0.93   LFT  Recent Labs  10/31/14 0856  PROT 7.7  AST 37  ALT 26  ALKPHOS 74  BILITOT 1.0   PT/INR  Recent Labs  10/31/14 1448  LABPROT 13.8  INR 1.05   PANCREAS  Recent Labs  10/31/14 0856  LIPASE 18         Studies/Results: Ct Abdomen Pelvis W Contrast  10/31/2014   CLINICAL DATA:  Initial encounter. Abdominal pain. Onset of symptoms 10/30/2014 at 1600 hours. Diarrhea. Bloody stool.  EXAM: CT ABDOMEN AND PELVIS WITH CONTRAST  TECHNIQUE: Multidetector CT imaging of the abdomen and pelvis was performed using the standard protocol following bolus administration of intravenous contrast.  CONTRAST:  37mL OMNIPAQUE IOHEXOL 300 MG/ML SOLN, 64mL OMNIPAQUE IOHEXOL 300 MG/ML SOLN  COMPARISON:  03/24/2006.  FINDINGS: Musculoskeletal: Levoconvex lumbar scoliosis. Postsurgical changes in the lower lumbar spine with paraspinal muscular atrophy. No aggressive osseous lesions. No fracture. The SI joints remain open.  Lung Bases:  Atelectasis.  Liver:  No mass lesion.  Old granulomatous disease.  Spleen:  Normal.  Gallbladder:  Phrygian cap configuration.  Common bile duct:  Normal.  Pancreas:  Normal.  Adrenal glands:  Normal.  Kidneys: 1 cm LEFT inferior pole simple renal cysts. Normal enhancement and excretion of contrast bilaterally. No ureteral obstruction.  Stomach:  Collapsed, within normal limits.  Small bowel:  No small bowel obstruction or inflammatory changes.  Colon: Ileocecal valve and ascending colon appear normal. Transverse colon is almost completely collapsed. At the junction of the descending colon and sigmoid colon, there is mural thickening and mural edema that extends to the rectum. Hyper enhancing mucosa is present and fluid in the lumen. Stranding is present in the pericolonic fat.  Pelvic Genitourinary: Mild thickening of the urinary bladder is a chronic finding in this patient. Uterus and adnexa appear within normal limits.  Peritoneum: No free air.  No free fluid.  Vasculature: Atherosclerosis is present. There is enlargement and low attenuation centrally within the LEFT gonadal vein (image 54 series 2). 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 This is immediately adjacent to the inflamed sigmoid colon. The iliac veins appear within normal limits. Visible inferior mesenteric artery appears patent.  Body Wall: Normal.  IMPRESSION: 1. Contiguous colitis extending from the anus to the junction of the sigmoid and descending colon. Differential considerations are inflammatory bowel disease, infectious colitis with ischemia less likely. 2. Enlargement of the LEFT inferior gonadal  vein with apparent central filling defect. Although this could be due to flow related artifact, the findings suggest gonadal vein thrombus. This is immediately adjacent to the inflamed sigmoid colon. 3. Nonspecific chronic bladder wall thickening.   Electronically Signed   By: Dereck Ligas M.D.   On:  10/31/2014 10:41    Medications: I have reviewed the patient's current medications.  Assessment/Plan: 1. Probable Ischemic Colitis. Doing well with conservative treatment. Will advance diet to low residue and if does well can go home on low residue diet and advance as tolerated. I discussed with granddaughter. Suggest f/u with Dr Cristina Gong in about 1 month and ? Colonoscopy at that time.   Katie Mosley,Katie Mosley 11/02/2014, 8:48 AM  2

## 2014-11-03 LAB — CBC
HCT: 28.4 % — ABNORMAL LOW (ref 36.0–46.0)
Hemoglobin: 9.9 g/dL — ABNORMAL LOW (ref 12.0–15.0)
MCH: 30.5 pg (ref 26.0–34.0)
MCHC: 34.9 g/dL (ref 30.0–36.0)
MCV: 87.4 fL (ref 78.0–100.0)
Platelets: 283 10*3/uL (ref 150–400)
RBC: 3.25 MIL/uL — ABNORMAL LOW (ref 3.87–5.11)
RDW: 13.4 % (ref 11.5–15.5)
WBC: 5.7 10*3/uL (ref 4.0–10.5)

## 2014-11-03 LAB — BASIC METABOLIC PANEL
ANION GAP: 7 (ref 5–15)
BUN: 17 mg/dL (ref 6–23)
CO2: 23 mmol/L (ref 19–32)
CREATININE: 0.89 mg/dL (ref 0.50–1.10)
Calcium: 8.2 mg/dL — ABNORMAL LOW (ref 8.4–10.5)
Chloride: 100 mmol/L (ref 96–112)
GFR calc Af Amer: 72 mL/min — ABNORMAL LOW (ref >90)
GFR, EST NON AFRICAN AMERICAN: 62 mL/min — AB (ref >90)
GLUCOSE: 110 mg/dL — AB (ref 70–99)
POTASSIUM: 3.8 mmol/L (ref 3.5–5.1)
Sodium: 130 mmol/L — ABNORMAL LOW (ref 135–145)

## 2014-11-03 LAB — MAGNESIUM: Magnesium: 1.7 mg/dL (ref 1.5–2.5)

## 2014-11-03 MED ORDER — AMLODIPINE BESYLATE 5 MG PO TABS
5.0000 mg | ORAL_TABLET | Freq: Once | ORAL | Status: DC
  Filled 2014-11-03: qty 1

## 2014-11-03 MED ORDER — MAGNESIUM SULFATE 50 % IJ SOLN
3.0000 g | Freq: Once | INTRAVENOUS | Status: AC
  Administered 2014-11-03: 3 g via INTRAVENOUS
  Filled 2014-11-03: qty 6

## 2014-11-03 MED ORDER — AMLODIPINE BESYLATE 5 MG PO TABS: 5.0000 mg | ORAL_TABLET | Freq: Every day | ORAL | Status: DC

## 2014-11-03 NOTE — Progress Notes (Addendum)
OT Cancellation Note  Patient Details Name: Katie Mosley MRN: [id number] DOB: 19-Aug-1941   Cancelled Treatment:    Reason Eval/Treat Not Completed: OT screened, no needs identified, will sign off. Spoke with granddaughter and she states pt has been up in the room without difficulty to go to the bathroom. She is with the pt at home and reports she was independent PTA. Granddaughter asked pt also if she feels comfortable with getting up and performing ADL and pt (through her granddaughter interpreting) states she has no difficulty. Will sign off. No acute OT needs. Nursing also reports pt has been up in the room, going to the bathroom on her own.  [place], [place] 11/03/2014, 9:17 AM

## 2014-11-03 NOTE — Discharge Summary (Signed)
Physician Discharge Summary  Katie Mosley [id number] DOB: [birth year] DOA: 10/31/2014  PCP: Philis Fendt, MD  Admit date: 10/31/2014 Discharge date: 11/03/2014  Time spent: 65 minutes  Recommendations for Outpatient Follow-up:  1. Follow up with AVBUERE,EDWIN A, MD in 1 week. On follow-up patient will need a basic metabolic profile done to follow-up on electrolytes and renal function. Patient blood pressure on the to be reassessed. His CBC also need to be obtained to follow-up on patient's H&H. 2. Follow-up with Dr. Cristina Gong, [facility] in one month for follow-up on ischemic colitis. At that time will be decided as to whether patient needs an elective outpatient colonoscopy.  Discharge Diagnoses:  Principal Problem:   Ischemic colitis Active Problems:   Hyponatremia   Benign essential HTN   Hypothyroidism   Discharge Condition: stable and improved  Diet recommendation: soft/low residue diet.  Filed Weights   10/31/14 1330  Weight: 58.06 kg (128 lb)    History of present illness:  74 year old female with a history of hypertension, esophageal stricture, hypothyroidism, GERD presented with 1 day history of worsening abdominal pain and hematochezia. The patient does not speak any [place]. This history was obtained from the patient's son at the bedside as well as review of the medical record. The patient normally struggles with constipation and has had intermittent lower abdominal pain for a "long time", but had an acute worsening on 10/30/2014. The patient had 2 episodes of hematochezia at home. There had been no nausea, vomiting, hematemesis, fevers, chills, dysuria, hematuria. She denied any chest pain, shortness breath, palpitations, dizziness. The patient denied any NSAID use. The patient had never had a screening colonoscopy. CT of the abdomen and pelvis in emergency department revealed colonic wall thickening with pericolonic stranding involving the descending and  sigmoid colon and including the rectum. There was also suggestion of left gonadal vein thrombosis. Labs revealed sodium 128, serum creatinine 1.03, WBC 11.7, hemoglobin 12.4. Urinalysis did not show any pyuria. Lactic acid was 0.67. The patient was started on Cipro and Flagyl and fluids in the emergency department.  Hospital Course:  #1 segmental ischemic colitis/hematochezia Patient was admitted with abdominal pain. CT of the abdomen and pelvis which was done showed colonic wall thickening with pericolonic stranding involving the descending and sigmoid colon including the rectum. It was felt this was likely an ischemic colitis as patient also did have some hematochezia. Patient was admitted placed on IV fluids supportive care and a clear liquid diet. Pain management. GI consultation was obtained and patient was seen in consultation by Dr. Cristina Gong. Patient was followed by GI throughout the hospitalization. Patient's HCTZ was discontinued. Patient was initially placed empirically on IV Zosyn which was discontinued after 2 days as patient did not look toxic. Patient improved clinically on a daily basis patient's diet was advanced to a low residue diet which she tolerated. Patient be discharged in stable and improved condition and will follow-up with GI as outpatient.   #2 hypertension Patient was admitted with an ischemic colitis. Patient's HCTZ was held secondary to ischemic colitis. Patient was maintained on metoprolol for blood pressure control. Low-dose Norvasc was added at 5 mg daily. Patient will follow-up with PCP as outpatient for further management of her hypertension.  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 Admitting physician discussed case with GI and feels this is more likely an ischemic colitis. Patient had very low risk for  embolism even if there were thrombosed would need to  hold off on anticoagulation. Admitting M.D., Dr. Carles Collet spoke with radiologist, Dr. Carron Curie who felt there was no iliac vein thrombosis no was they any IMV or SMV thrombus and findings likely a low flow state due to patient's colitis. Patient was maintained on treatment for colitis for now and may consider repeat CT scan in several weeks for follow-up.  #4 hyponatremia Likely secondary to volume depletion in the setting of diuretics. Patient was hydrated with IV fluids with improvement with her hyponatremia. Outpatient follow-up.   #5 hypothyroidism Continued on home regimen of Synthroid.  #6 gastroesophageal reflux disease Patient was maintained on a PPI.  Procedures:  CT abdomen and pelvis 10/31/2014  Consultations:  Gastroenterology: Dr. Cristina Gong 10/31/2014.  Discharge Exam: Filed Vitals:   11/03/14 1400  BP: 172/50  Pulse: 56  Temp: 97.8 F (36.6 C)  Resp: 18    General: NAD Cardiovascular: RRR Respiratory: CTAB  Discharge Instructions   Discharge Instructions    Diet general    Complete by:  As directed   Low residue/Soft diet.     Discharge instructions    Complete by:  As directed   Follow up with AVBUERE,EDWIN A, MD in 1 week. Follow up with Dr Cristina Gong in 1 month     Increase activity slowly    Complete by:  As directed           Current Discharge Medication List    START taking these medications   Details  amLODipine (NORVASC) 5 MG tablet Take 1 tablet (5 mg total) by mouth daily. Qty: 30 tablet, Refills: 0      CONTINUE these medications which have NOT CHANGED   Details  lansoprazole (PREVACID) 30 MG capsule Take 30 mg by mouth daily.      latanoprost (XALATAN) 0.005 % ophthalmic solution Place 1 drop into the left eye at bedtime.      levothyroxine (SYNTHROID, LEVOTHROID) 50 MCG tablet Take 50 mcg by mouth daily. Refills: 2    loratadine (CLARITIN) 10 MG tablet Take 10 mg by mouth daily. allergies    metoprolol succinate (TOPROL-XL) 50 MG 24 hr  tablet Take 50 mg by mouth daily. Refills: 2    PROAIR HFA 108 (90 BASE) MCG/ACT inhaler Inhale 1 puff into the lungs every 6 (six) hours as needed for wheezing.  Refills: 5    VESICARE 10 MG tablet Take 10 mg by mouth daily. Refills: 3      STOP taking these medications     hydrochlorothiazide (HYDRODIURIL) 12.5 MG tablet        No Known Allergies Follow-up Information    Follow up with AVBUERE,EDWIN A, MD. Schedule an appointment as soon as possible for a visit in 1 week.   Specialty:  Internal Medicine   Contact information:   [address] [place] [address] [phone]       Follow up with Cleotis Nipper, MD. Schedule an appointment as soon as possible for a visit in 1 month.   Specialty:  Gastroenterology   Contact information:   [id number] [place] [address] [phone]        The results of significant diagnostics from this hospitalization (including imaging, microbiology, ancillary and laboratory) are listed below for reference.    Significant Diagnostic Studies: Ct Abdomen Pelvis W Contrast  10/31/2014   CLINICAL DATA:  Initial encounter. Abdominal pain. Onset of symptoms 10/30/2014 at 1600 hours. Diarrhea. Bloody stool.  EXAM: CT ABDOMEN AND PELVIS WITH CONTRAST  TECHNIQUE: Multidetector CT imaging of the abdomen and pelvis was performed using the standard protocol following bolus administration of intravenous contrast.  CONTRAST:  72mL OMNIPAQUE IOHEXOL 300 MG/ML SOLN, 67mL OMNIPAQUE IOHEXOL 300 MG/ML SOLN  COMPARISON:  03/24/2006.  FINDINGS: Musculoskeletal: Levoconvex lumbar scoliosis. Postsurgical changes in the lower lumbar spine with paraspinal muscular atrophy. No aggressive osseous lesions. No fracture. The SI joints remain open.  Lung Bases: Atelectasis.  Liver:  No mass lesion.  Old granulomatous disease.  Spleen:  Normal.  Gallbladder:  Phrygian cap configuration.  Common bile duct:  Normal.  Pancreas:  Normal.  Adrenal  glands:  Normal.  Kidneys: 1 cm LEFT inferior pole simple renal cysts. Normal enhancement and excretion of contrast bilaterally. No ureteral obstruction.  Stomach:  Collapsed, within normal limits.  Small bowel:  No small bowel obstruction or inflammatory changes.  Colon: Ileocecal valve and ascending colon appear normal. Transverse colon is almost completely collapsed. At the junction of the descending colon and sigmoid colon, there is mural thickening and mural edema that extends to the rectum. Hyper enhancing mucosa is present and fluid in the lumen. Stranding is present in the pericolonic fat.  Pelvic Genitourinary: Mild thickening of the urinary bladder is a chronic finding in this patient. Uterus and adnexa appear within normal limits.  Peritoneum: No free air.  No free fluid.  Vasculature: Atherosclerosis is present. There is enlargement and low attenuation centrally within the LEFT gonadal vein (image 54 series 2). 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 This is immediately adjacent to the inflamed sigmoid colon. The iliac veins appear within normal limits. Visible inferior mesenteric artery appears patent.  Body Wall: Normal.  IMPRESSION: 1. Contiguous colitis extending from the anus to the junction of the sigmoid and descending colon. Differential considerations are inflammatory bowel disease, infectious colitis with ischemia less likely. 2. Enlargement of the LEFT inferior gonadal vein with apparent central filling defect. Although this could be due to flow related artifact, the findings suggest gonadal vein thrombus. This is immediately adjacent to the inflamed sigmoid colon. 3. Nonspecific chronic bladder wall thickening.   Electronically Signed   By: Dereck Ligas M.D.   On: 10/31/2014 10:41    Microbiology: Recent Results (from the past 240 hour(s))  Urine culture     Status: None   Collection Time: 10/31/14  9:44 AM  Result Value Ref Range Status    Specimen Description URINE, CATHETERIZED  Final   Special Requests NONE  Final   Colony Count NO GROWTH Performed at [facility]   Final   Culture NO GROWTH Performed at [facility]   Final   Report Status 11/01/2014 FINAL  Final  Culture, blood (routine x 2)     Status: None (Preliminary result)   Collection Time: 10/31/14 11:29 AM  Result Value Ref Range Status   Specimen Description BLOOD RIGHT ANTECUBITAL  Final   Special Requests BOTTLES DRAWN AEROBIC AND ANAEROBIC 3 CC EACH  Final   Culture   Final           BLOOD CULTURE RECEIVED NO GROWTH TO DATE CULTURE WILL BE HELD FOR 5 DAYS BEFORE ISSUING A FINAL NEGATIVE REPORT Performed at [facility]    Report Status PENDING  Incomplete  Culture, blood (routine x 2)     Status: None (Preliminary result)   Collection Time: 10/31/14 11:42 AM  Result Value Ref Range  Status   Specimen Description BLOOD RIGHT HAND  Final   Special Requests BOTTLES DRAWN AEROBIC AND ANAEROBIC 3CC EACH  Final   Culture   Final           BLOOD CULTURE RECEIVED NO GROWTH TO DATE CULTURE WILL BE HELD FOR 5 DAYS BEFORE ISSUING A FINAL NEGATIVE REPORT Performed at [facility]    Report Status PENDING  Incomplete     Labs: Basic Metabolic Panel:  Recent Labs Lab 10/31/14 0856 11/01/14 0506 11/02/14 0534 11/03/14 0542  NA 128* 128* 130* 130*  K 4.0 3.9 3.9 3.8  CL 94* 99 98 100  CO2 27 24 24 23   GLUCOSE 102* 91 92 110*  BUN 25* 14 10 17   CREATININE 1.03 0.88 0.93 0.89  CALCIUM 9.3 8.4 8.8 8.2*  MG  --   --  1.6 1.7   Liver Function Tests:  Recent Labs Lab 10/31/14 0856  AST 37  ALT 26  ALKPHOS 74  BILITOT 1.0  PROT 7.7  ALBUMIN 3.8    Recent Labs Lab 10/31/14 0856  LIPASE 18   No results for input(s): AMMONIA in the last 168 hours. CBC:  Recent Labs Lab 10/31/14 0856 11/01/14 0506 11/02/14 0534 11/03/14 0542  WBC 11.7* 7.1 4.7 5.7  NEUTROABS 9.4*  --   --   --   HGB 12.4 10.6* 10.3*  9.9*  HCT 35.4* 30.5* 29.6* 28.4*  MCV 86.3 86.6 87.1 87.4  PLT 338 267 292 283   Cardiac Enzymes: No results for input(s): CKTOTAL, CKMB, CKMBINDEX, TROPONINI in the last 168 hours. BNP: BNP (last 3 results) No results for input(s): BNP in the last 8760 hours.  ProBNP (last 3 results) No results for input(s): PROBNP in the last 8760 hours.  CBG: No results for input(s): GLUCAP in the last 168 hours.     SignedIrine Seal MD [facility] 11/03/2014, 2:47 PM

## 2014-11-03 NOTE — Progress Notes (Signed)
PT Cancellation Note  Patient Details Name: Katie Mosley MRN: [id number] DOB: [birth year]   Cancelled Treatment:    Reason Eval/Treat Not Completed: PT screened, no needs identified, will sign off. Granddaughters present and they state that pt has been getting up and going to the bathroom and feel she is at her baseline.  She will have 24 hour A and they feel she does not need PT.  Will sign off. Santiago Glad L. Tamala Julian, [place] Pager [phone] 11/03/2014    Katie Mosley 11/03/2014, 10:52 AM

## 2014-11-03 NOTE — Progress Notes (Signed)
[facility] PROGRESS NOTE Subjective granddaughter in room. Patient apparently tolerating low residue diet with no abdominal pain.  Objective: Vital signs in last 24 hours: Temp:  [98.5 F (36.9 C)-98.8 F (37.1 C)] 98.5 F (36.9 C) (03/09 0602) Pulse Rate:  [61-72] 68 (03/09 0602) Resp:  [16-18] 16 (03/09 0602) BP: (164-189)/(55-65) 180/56 mmHg (03/09 0602) SpO2:  [99 %-100 %] 100 % (03/09 0602) Last BM Date: 10/31/14  Intake/Output from previous day: 03/08 0701 - 03/09 0700 In: 1350 [P.O.:500; I.V.:800] Out: 2225 [Urine:2225] Intake/Output this shift:    PE: General--no acute distress  Abdomen-- soft nontender nondistended  Lab Results:  Recent Labs  10/31/14 0856 11/01/14 0506 11/02/14 0534 11/03/14 0542  WBC 11.7* 7.1 4.7 5.7  HGB 12.4 10.6* 10.3* 9.9*  HCT 35.4* 30.5* 29.6* 28.4*  PLT 338 267 292 283   BMET  Recent Labs  10/31/14 0856 11/01/14 0506 11/02/14 0534 11/03/14 0542  NA 128* 128* 130* 130*  K 4.0 3.9 3.9 3.8  CL 94* 99 98 100  CO2 27 24 24 23   CREATININE 1.03 0.88 0.93 0.89   LFT  Recent Labs  10/31/14 0856  PROT 7.7  AST 37  ALT 26  ALKPHOS 74  BILITOT 1.0   PT/INR  Recent Labs  10/31/14 1448  LABPROT 13.8  INR 1.05   PANCREAS  Recent Labs  10/31/14 0856  LIPASE 18         Studies/Results: No results found.  Medications: I have reviewed the patient's current medications.  Assessment/Plan: 1. 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 Patient improving dramatically conservative therapy. She's now tolerating a low residue diet. I feel that she could be discharged and follow-up in the future in about one month with Dr. Cristina Gong to consider elective outpatient colonoscopy. Have discussed this with the granddaughter. Please call us back if there are any problems. Time spent 20 minutes   Duran Ohern JR,Malikhi Ogan L 11/03/2014, 8:33 AM

## 2014-11-06 LAB — CULTURE, BLOOD (ROUTINE X 2)
CULTURE: NO GROWTH
Culture: NO GROWTH

## 2014-12-20 ENCOUNTER — Emergency Department (HOSPITAL_COMMUNITY)
Admission: EM | Admit: 2014-12-20 | Discharge: 2014-12-21 | Disposition: A | Discharge status: Home / Self Care | Payer: Medicare Other | Attending: Emergency Medicine | Admitting: Emergency Medicine

## 2014-12-20 ENCOUNTER — Encounter (HOSPITAL_COMMUNITY): Payer: Self-pay | Admitting: *Deleted

## 2014-12-20 ENCOUNTER — Emergency Department (HOSPITAL_COMMUNITY): Payer: Medicare Other

## 2014-12-20 DIAGNOSIS — Z79899 Other long term (current) drug therapy: Secondary | ICD-10-CM | POA: Diagnosis not present

## 2014-12-20 DIAGNOSIS — E039 Hypothyroidism, unspecified: Secondary | ICD-10-CM | POA: Diagnosis not present

## 2014-12-20 DIAGNOSIS — J189 Pneumonia, unspecified organism: Secondary | ICD-10-CM

## 2014-12-20 DIAGNOSIS — Z9049 Acquired absence of other specified parts of digestive tract: Secondary | ICD-10-CM | POA: Diagnosis not present

## 2014-12-20 DIAGNOSIS — J159 Unspecified bacterial pneumonia: Secondary | ICD-10-CM | POA: Insufficient documentation

## 2014-12-20 DIAGNOSIS — R109 Unspecified abdominal pain: Secondary | ICD-10-CM | POA: Insufficient documentation

## 2014-12-20 DIAGNOSIS — R51 Headache: Secondary | ICD-10-CM | POA: Diagnosis not present

## 2014-12-20 DIAGNOSIS — K219 Gastro-esophageal reflux disease without esophagitis: Secondary | ICD-10-CM | POA: Diagnosis not present

## 2014-12-20 DIAGNOSIS — I10 Essential (primary) hypertension: Secondary | ICD-10-CM | POA: Diagnosis not present

## 2014-12-20 DIAGNOSIS — R05 Cough: Secondary | ICD-10-CM | POA: Diagnosis present

## 2014-12-20 LAB — CBC WITH DIFFERENTIAL/PLATELET
Basophils Absolute: 0 10*3/uL (ref 0.0–0.1)
Basophils Relative: 0 % (ref 0–1)
EOS ABS: 0 10*3/uL (ref 0.0–0.7)
Eosinophils Relative: 0 % (ref 0–5)
HEMATOCRIT: 33.5 % — AB (ref 36.0–46.0)
Hemoglobin: 12 g/dL (ref 12.0–15.0)
Lymphocytes Relative: 11 % — ABNORMAL LOW (ref 12–46)
Lymphs Abs: 0.8 10*3/uL (ref 0.7–4.0)
MCH: 30.2 pg (ref 26.0–34.0)
MCHC: 35.8 g/dL (ref 30.0–36.0)
MCV: 84.4 fL (ref 78.0–100.0)
Monocytes Absolute: 1 10*3/uL (ref 0.1–1.0)
Monocytes Relative: 13 % — ABNORMAL HIGH (ref 3–12)
NEUTROS ABS: 5.7 10*3/uL (ref 1.7–7.7)
Neutrophils Relative %: 76 % (ref 43–77)
Platelets: 354 10*3/uL (ref 150–400)
RBC: 3.97 MIL/uL (ref 3.87–5.11)
RDW: 13.1 % (ref 11.5–15.5)
WBC: 7.5 10*3/uL (ref 4.0–10.5)

## 2014-12-20 LAB — COMPREHENSIVE METABOLIC PANEL
ALK PHOS: 130 U/L — AB (ref 39–117)
ALT: 32 U/L (ref 0–35)
AST: 31 U/L (ref 0–37)
Albumin: 2.9 g/dL — ABNORMAL LOW (ref 3.5–5.2)
Anion gap: 13 (ref 5–15)
BILIRUBIN TOTAL: 0.9 mg/dL (ref 0.3–1.2)
BUN: 15 mg/dL (ref 6–23)
CO2: 24 mmol/L (ref 19–32)
Calcium: 9.2 mg/dL (ref 8.4–10.5)
Chloride: 90 mmol/L — ABNORMAL LOW (ref 96–112)
Creatinine, Ser: 1.08 mg/dL (ref 0.50–1.10)
GFR, EST AFRICAN AMERICAN: 57 mL/min — AB (ref >90)
GFR, EST NON AFRICAN AMERICAN: 49 mL/min — AB (ref >90)
Glucose, Bld: 179 mg/dL — ABNORMAL HIGH (ref 70–99)
Potassium: 4.1 mmol/L (ref 3.5–5.1)
Sodium: 127 mmol/L — ABNORMAL LOW (ref 135–145)
TOTAL PROTEIN: 7.7 g/dL (ref 6.0–8.3)

## 2014-12-20 LAB — LIPASE, BLOOD: LIPASE: 24 U/L (ref 11–59)

## 2014-12-20 MED ORDER — MORPHINE SULFATE 4 MG/ML IJ SOLN
4.0000 mg | Freq: Once | INTRAMUSCULAR | Status: AC
  Administered 2014-12-21: 4 mg via INTRAVENOUS
  Filled 2014-12-20: qty 1

## 2014-12-20 MED ORDER — METOCLOPRAMIDE HCL 5 MG/ML IJ SOLN
10.0000 mg | Freq: Once | INTRAMUSCULAR | Status: AC
  Administered 2014-12-21: 10 mg via INTRAVENOUS
  Filled 2014-12-20: qty 2

## 2014-12-20 MED ORDER — SODIUM CHLORIDE 0.9 % IV BOLUS (SEPSIS)
1000.0000 mL | Freq: Once | INTRAVENOUS | Status: AC
  Administered 2014-12-21: 1000 mL via INTRAVENOUS

## 2014-12-20 NOTE — ED Notes (Signed)
Pt and family reports pt having RLQ pain for days, denies n/v/d. Last BM was Friday. Having pounding headache and non productive cough.

## 2014-12-20 NOTE — ED Provider Notes (Signed)
CSN: [id number]     Arrival date & time 12/20/14  1640 History  This chart was scribed for Jola Schmidt, MD by Rayfield Citizen, ED Scribe. This patient was seen in room D36C/D36C and the patient's care was started at 11:30 PM.    Chief Complaint  Patient presents with  . Abdominal Pain  . Headache  . Cough   Patient is a 74 y.o. female presenting with abdominal pain, headaches, and cough. The history is provided by the patient and a relative. No language interpreter was used (Family translated).  Abdominal Pain Associated symptoms: cough   Headache Associated symptoms: abdominal pain and cough   Cough Associated symptoms: headaches      HPI Comments: Katie Mosley is a 74 y.o. female who presents to the Emergency Department complaining of 5 days of sudden onset, constant generalized "pressure"-like headache with associated auditory disturbance described as "noises." She has taken Tylenol PM without relief. Patient has previous experience with headaches but has been without similar symptoms for years.   Patient reports 4 days of lower left flank pain after a mechanical fall onto her bottom on the carpeted floor.   Patient also notes "cold and cough" symptoms for several days with weakness and chills. She denies chest pain, SOB.   She denies anticoagulant use.   Past Medical History  Diagnosis Date  . Hypertension   . GERD (gastroesophageal reflux disease)   . Hypothyroidism    Past Surgical History  Procedure Laterality Date  . Capsulotomy  08/23/2011    Procedure: MINOR CAPSULOTOMY;  Surgeon: Myrtha Mantis.;  Location: [place];  Service: Ophthalmology;  Laterality: Left;  YAG Capsulotomy Left Eye  . Esophagogastroduodenoscopy N/A 03/17/2013    Procedure: ESOPHAGOGASTRODUODENOSCOPY (EGD);  Surgeon: Irene Shipper, MD;  Location: [facility];  Service: Endoscopy;  Laterality: N/A;  . Left cataract extraction      2007  . Right cataract extraction      2006  .  Appendectomy     History reviewed. No pertinent family history. History  Substance Use Topics  . Smoking status: Never Smoker   . Smokeless tobacco: Never Used  . Alcohol Use: No   OB History    No data available     Review of Systems  Respiratory: Positive for cough.   Gastrointestinal: Positive for abdominal pain.  Neurological: Positive for headaches.   A complete 10 system review of systems was obtained and all systems are negative except as noted in the HPI and PMH.    Allergies  Review of patient's allergies indicates no known allergies.  Home Medications   Prior to Admission medications   Medication Sig Start Date End Date Taking? Authorizing Provider  amLODipine (NORVASC) 5 MG tablet Take 1 tablet (5 mg total) by mouth daily. 11/03/14  Yes Eugenie Filler, MD  hydrochlorothiazide (MICROZIDE) 12.5 MG capsule Take 12.5 mg by mouth daily.   Yes Historical Provider, MD  lansoprazole (PREVACID) 30 MG capsule Take 30 mg by mouth daily.     Yes Historical Provider, MD  latanoprost (XALATAN) 0.005 % ophthalmic solution Place 1 drop into the left eye at bedtime.     Yes Historical Provider, MD  levothyroxine (SYNTHROID, LEVOTHROID) 50 MCG tablet Take 50 mcg by mouth daily. 09/05/14  Yes Historical Provider, MD  metoprolol succinate (TOPROL-XL) 50 MG 24 hr tablet Take 50 mg by mouth daily. 09/05/14  Yes Historical Provider, MD  PROAIR HFA 108 (90 BASE) MCG/ACT inhaler Inhale 1  puff into the lungs every 6 (six) hours as needed for wheezing.  09/16/14  Yes Historical Provider, MD  VESICARE 10 MG tablet Take 10 mg by mouth daily. 09/07/14  Yes Historical Provider, MD   BP 147/70 mmHg  Pulse 79  Temp(Src) 98.4 F (36.9 C) (Oral)  Resp 16  Ht 4\' 11"  (1.499 m)  Wt 127 lb (57.607 kg)  BMI 25.64 kg/m2  SpO2 99% Physical Exam  Constitutional: She is oriented to person, place, and time. She appears well-developed and well-nourished.  HENT:  Head: Normocephalic and atraumatic.  Poor  dentition. Uvula normal.   Eyes: Pupils are equal, round, and reactive to light.  Cardiovascular: Regular rhythm.   Pulmonary/Chest: Effort normal and breath sounds normal. No respiratory distress. She has no wheezes. She has no rales.  Abdominal: Soft.  Musculoskeletal: Normal range of motion.  Neurological: She is alert and oriented to person, place, and time.  5/5 strength in major muscle groups of  bilateral upper and lower extremities. Speech normal. No facial asymetry.   Nursing note and vitals reviewed.  ED Course  Procedures   DIAGNOSTIC STUDIES: Oxygen Saturation is 99% on RA, normal by my interpretation.    COORDINATION OF CARE: 11:41 PM Discussed treatment plan with pt at bedside and pt agreed to plan.   Labs Review Labs Reviewed  CBC WITH DIFFERENTIAL/PLATELET - Abnormal; Notable for the following:    HCT 33.5 (*)    Lymphocytes Relative 11 (*)    Monocytes Relative 13 (*)    All other components within normal limits  COMPREHENSIVE METABOLIC PANEL - Abnormal; Notable for the following:    Sodium 127 (*)    Chloride 90 (*)    Glucose, Bld 179 (*)    Albumin 2.9 (*)    Alkaline Phosphatase 130 (*)    GFR calc non Af Amer 49 (*)    GFR calc Af Amer 57 (*)    All other components within normal limits  LIPASE, BLOOD    Imaging Review Dg Chest 2 View  12/20/2014   CLINICAL DATA:  Productive cough and right lower quadrant pain for several days.  EXAM: CHEST  2 VIEW  COMPARISON:  08/04/2014 and abdominal CT 10/31/2014  FINDINGS: Exam demonstrates opacification over the left base likely a small left-sided effusion with associated atelectasis although cannot exclude infection in the left base. Slight elevation and cardiomediastinal silhouette and remainder of the exam is unchanged. Scalloping of the right hemidiaphragm unchanged.  IMPRESSION: Left basilar opacification likely small effusion with associated atelectasis, although cannot exclude infection in the left base.    Electronically Signed   By: Marin Olp M.D.   On: 12/20/2014 18:06   Ct Head Wo Contrast  12/21/2014   CLINICAL DATA:  74 year old female with 5 day history of headache and weakness  EXAM: CT HEAD WITHOUT CONTRAST  TECHNIQUE: Contiguous axial images were obtained from the base of the skull through the vertex without intravenous contrast.  COMPARISON:  Prior head CT 03/05/2008  FINDINGS: Negative for acute intracranial hemorrhage, acute infarction, mass, mass effect, hydrocephalus or midline shift. Gray-white differentiation is preserved throughout. Stable calcification in the left cerebellar peduncle and bilateral basal ganglia. Mild atrophy. Periventricular white matter hypoattenuation consistent with mild chronic microvascular ischemic white matter disease. No focal scalp or calvarial abnormality. Normal aeration mastoid air cells. Partial opacification of scattered ethmoid air cells. Atherosclerotic calcification of the bilateral cavernous carotid arteries.  IMPRESSION: 1. No acute intracranial abnormality. 2. Atrophy and mild  chronic microvascular ischemic white matter disease. 3. Intracranial atherosclerosis. 4. Inflammatory paranasal sinus disease predominantly involving the ethmoid air cells.   Electronically Signed   By: Jacqulynn Cadet M.D.   On: 12/21/2014 00:37   Ct Angio Chest Pe W/cm &/or Wo Cm  12/21/2014   CLINICAL DATA:  74 year old female with cough, shortness of breath and abnormality in the left lung base seen on recent chest x-ray  EXAM: CT ANGIOGRAPHY CHEST WITH CONTRAST  TECHNIQUE: Multidetector CT imaging of the chest was performed using the standard protocol during bolus administration of intravenous contrast. Multiplanar CT image reconstructions and MIPs were obtained to evaluate the vascular anatomy.  CONTRAST:  86mL OMNIPAQUE IOHEXOL 350 MG/ML SOLN  COMPARISON:  Chest x-ray obtained earlier today ; prior CT scan of the abdomen and pelvis including the lung bases 11/02/2014   FINDINGS: Mediastinum: Unremarkable CT appearance of the thyroid gland. No suspicious mediastinal or hilar adenopathy. No soft tissue mediastinal mass. The thoracic esophagus is unremarkable.  Heart/Vascular: 2 vessel aortic arch. Right brachiocephalic and left common carotid artery share a common origin. No aneurysmal dilatation. Adequate opacification of the pulmonary artery to the proximal subsegmental level. No evidence of central filling defect to suggest acute pulmonary embolus. However, the main pulmonary artery is enlarged relative to the adjacent aorta suggesting the possibility of underlying pulmonary arterial hypertension. Mild cardiomegaly.  Lungs/Pleura: Left lower lobe consolidation and superimposed volume loss. This represents an interval change compared to 10/31/2014. Mild lower lobe bronchial wall thickening.  Bones/Soft Tissues: No acute fracture or aggressive appearing lytic or blastic osseous lesion.  Upper Abdomen: Visualized upper abdominal organs are unremarkable.  Review of the MIP images confirms the above findings.  IMPRESSION: 1. Negative for acute pulmonary embolus. 2. Left lower lobe consolidation and volume loss concerning for bronchopneumonia. 3. Mild cardiomegaly. 4. Mild enlargement of the main and central pulmonary arteries as can be seen in the setting of pulmonary arterial hypertension. 5. Lower lobe bronchial wall thickening.   Electronically Signed   By: Jacqulynn Cadet M.D.   On: 12/21/2014 02:48     EKG Interpretation   Date/Time:  Monday December 20 2014 22:47:02 EDT Ventricular Rate:  93 PR Interval:  148 QRS Duration: 70 QT Interval:  335 QTC Calculation: 417 R Axis:   6 Text Interpretation:  Sinus rhythm Left ventricular hypertrophy No  significant change was found Confirmed by Julitza Rickles  MD, Lennette Bihari ([id number]) on  12/21/2014 7:49:45 AM      MDM   Final diagnoses:  CAP (community acquired pneumonia)   Patient with left lower lobe pneumonia.  This likely  explains her left-sided chest and abdominal pain.  Headache may be related to infection.  Patient be started on antibiotics.  CT head is negative for pathology.  Discharge home in good condition.  Patient is feeling much better.  I personally performed the services described in this documentation, which was scribed in my presence. The recorded information has been reviewed and is accurate.         Jola Schmidt, MD 12/22/14 [phone]

## 2014-12-21 ENCOUNTER — Emergency Department (HOSPITAL_COMMUNITY): Payer: Medicare Other

## 2014-12-21 ENCOUNTER — Encounter (HOSPITAL_COMMUNITY): Payer: Self-pay

## 2014-12-21 DIAGNOSIS — J159 Unspecified bacterial pneumonia: Secondary | ICD-10-CM | POA: Diagnosis not present

## 2014-12-21 MED ORDER — LEVOFLOXACIN 500 MG PO TABS: 500.0000 mg | ORAL_TABLET | Freq: Every day | ORAL | Status: DC

## 2014-12-21 MED ORDER — LEVOFLOXACIN 500 MG PO TABS
500.0000 mg | ORAL_TABLET | Freq: Once | ORAL | Status: AC
  Administered 2014-12-21: 500 mg via ORAL
  Filled 2014-12-21: qty 1

## 2014-12-21 MED ORDER — IOHEXOL 350 MG/ML SOLN
100.0000 mL | Freq: Once | INTRAVENOUS | Status: AC | PRN
  Administered 2014-12-21: 49 mL via INTRAVENOUS

## 2014-12-21 NOTE — ED Notes (Signed)
IV attempted x 2 by two different RNs

## 2014-12-21 NOTE — Discharge Instructions (Signed)

## 2014-12-21 NOTE — ED Notes (Signed)
Family at bedside. 

## 2014-12-21 NOTE — ED Notes (Signed)
Patient is alert and orientedx4.  Patient was explained discharge instructions and they understood them with no questions.  The patient's niece is taking her home.

## 2015-02-13 ENCOUNTER — Inpatient Hospital Stay (HOSPITAL_COMMUNITY): Payer: Medicare Other

## 2015-02-13 ENCOUNTER — Emergency Department (HOSPITAL_COMMUNITY): Payer: Medicare Other

## 2015-02-13 ENCOUNTER — Inpatient Hospital Stay (HOSPITAL_COMMUNITY)
Admission: EM | Admit: 2015-02-13 | Discharge: 2015-02-18 | DRG: 291 | Disposition: A | Discharge status: Home / Self Care | Payer: Medicare Other | Attending: Family Medicine | Admitting: Family Medicine

## 2015-02-13 ENCOUNTER — Encounter (HOSPITAL_COMMUNITY): Payer: Self-pay | Admitting: *Deleted

## 2015-02-13 DIAGNOSIS — I272 Other secondary pulmonary hypertension: Secondary | ICD-10-CM | POA: Diagnosis present

## 2015-02-13 DIAGNOSIS — K219 Gastro-esophageal reflux disease without esophagitis: Secondary | ICD-10-CM | POA: Diagnosis present

## 2015-02-13 DIAGNOSIS — J690 Pneumonitis due to inhalation of food and vomit: Secondary | ICD-10-CM | POA: Diagnosis present

## 2015-02-13 DIAGNOSIS — E079 Disorder of thyroid, unspecified: Secondary | ICD-10-CM | POA: Diagnosis present

## 2015-02-13 DIAGNOSIS — I959 Hypotension, unspecified: Secondary | ICD-10-CM | POA: Diagnosis not present

## 2015-02-13 DIAGNOSIS — D649 Anemia, unspecified: Secondary | ICD-10-CM | POA: Diagnosis present

## 2015-02-13 DIAGNOSIS — T68XXXA Hypothermia, initial encounter: Secondary | ICD-10-CM | POA: Diagnosis present

## 2015-02-13 DIAGNOSIS — R0602 Shortness of breath: Secondary | ICD-10-CM | POA: Diagnosis present

## 2015-02-13 DIAGNOSIS — R0781 Pleurodynia: Secondary | ICD-10-CM | POA: Diagnosis present

## 2015-02-13 DIAGNOSIS — R7989 Other specified abnormal findings of blood chemistry: Secondary | ICD-10-CM

## 2015-02-13 DIAGNOSIS — I5033 Acute on chronic diastolic (congestive) heart failure: Secondary | ICD-10-CM | POA: Diagnosis present

## 2015-02-13 DIAGNOSIS — R946 Abnormal results of thyroid function studies: Secondary | ICD-10-CM | POA: Diagnosis not present

## 2015-02-13 DIAGNOSIS — I270 Primary pulmonary hypertension: Secondary | ICD-10-CM | POA: Diagnosis not present

## 2015-02-13 DIAGNOSIS — J4520 Mild intermittent asthma, uncomplicated: Secondary | ICD-10-CM | POA: Diagnosis not present

## 2015-02-13 DIAGNOSIS — E039 Hypothyroidism, unspecified: Secondary | ICD-10-CM | POA: Diagnosis present

## 2015-02-13 DIAGNOSIS — I509 Heart failure, unspecified: Secondary | ICD-10-CM | POA: Diagnosis not present

## 2015-02-13 DIAGNOSIS — E875 Hyperkalemia: Secondary | ICD-10-CM

## 2015-02-13 DIAGNOSIS — I5031 Acute diastolic (congestive) heart failure: Secondary | ICD-10-CM | POA: Insufficient documentation

## 2015-02-13 DIAGNOSIS — I10 Essential (primary) hypertension: Secondary | ICD-10-CM | POA: Diagnosis present

## 2015-02-13 DIAGNOSIS — R0989 Other specified symptoms and signs involving the circulatory and respiratory systems: Secondary | ICD-10-CM

## 2015-02-13 DIAGNOSIS — J45909 Unspecified asthma, uncomplicated: Secondary | ICD-10-CM | POA: Diagnosis present

## 2015-02-13 DIAGNOSIS — J9601 Acute respiratory failure with hypoxia: Secondary | ICD-10-CM | POA: Diagnosis present

## 2015-02-13 DIAGNOSIS — E871 Hypo-osmolality and hyponatremia: Secondary | ICD-10-CM | POA: Diagnosis present

## 2015-02-13 HISTORY — DX: Unspecified asthma, uncomplicated: J45.909

## 2015-02-13 HISTORY — DX: Disorder of thyroid, unspecified: E07.9

## 2015-02-13 HISTORY — DX: Essential (primary) hypertension: I10

## 2015-02-13 LAB — BLOOD GAS, ARTERIAL
ACID-BASE DEFICIT: 2 mmol/L (ref 0.0–2.0)
BICARBONATE: 22.8 meq/L (ref 20.0–24.0)
DRAWN BY: 422461
FIO2: 0.21 %
O2 Saturation: 94.1 %
PATIENT TEMPERATURE: 98.4
TCO2: 21.6 mmol/L (ref 0–100)
pCO2 arterial: 41.5 mmHg (ref 35.0–45.0)
pH, Arterial: 7.357 (ref 7.350–7.450)
pO2, Arterial: 75.7 mmHg — ABNORMAL LOW (ref 80.0–100.0)

## 2015-02-13 LAB — CBC
HCT: 27 % — ABNORMAL LOW (ref 36.0–46.0)
Hemoglobin: 9.1 g/dL — ABNORMAL LOW (ref 12.0–15.0)
MCH: 30.2 pg (ref 26.0–34.0)
MCHC: 33.7 g/dL (ref 30.0–36.0)
MCV: 89.7 fL (ref 78.0–100.0)
PLATELETS: 317 10*3/uL (ref 150–400)
RBC: 3.01 MIL/uL — AB (ref 3.87–5.11)
RDW: 14.5 % (ref 11.5–15.5)
WBC: 4.4 10*3/uL (ref 4.0–10.5)

## 2015-02-13 LAB — CBC WITH DIFFERENTIAL/PLATELET
BASOS PCT: 0 % (ref 0–1)
Basophils Absolute: 0 10*3/uL (ref 0.0–0.1)
EOS PCT: 1 % (ref 0–5)
Eosinophils Absolute: 0 10*3/uL (ref 0.0–0.7)
HEMATOCRIT: 28.4 % — AB (ref 36.0–46.0)
Hemoglobin: 9.5 g/dL — ABNORMAL LOW (ref 12.0–15.0)
LYMPHS ABS: 1 10*3/uL (ref 0.7–4.0)
Lymphocytes Relative: 21 % (ref 12–46)
MCH: 30.2 pg (ref 26.0–34.0)
MCHC: 33.5 g/dL (ref 30.0–36.0)
MCV: 90.2 fL (ref 78.0–100.0)
MONO ABS: 0.6 10*3/uL (ref 0.1–1.0)
Monocytes Relative: 12 % (ref 3–12)
NEUTROS ABS: 3.3 10*3/uL (ref 1.7–7.7)
Neutrophils Relative %: 66 % (ref 43–77)
Platelets: 312 10*3/uL (ref 150–400)
RBC: 3.15 MIL/uL — AB (ref 3.87–5.11)
RDW: 14.5 % (ref 11.5–15.5)
WBC: 5 10*3/uL (ref 4.0–10.5)

## 2015-02-13 LAB — BASIC METABOLIC PANEL
ANION GAP: 7 (ref 5–15)
BUN: 30 mg/dL — ABNORMAL HIGH (ref 6–20)
CO2: 22 mmol/L (ref 22–32)
Calcium: 9 mg/dL (ref 8.9–10.3)
Chloride: 99 mmol/L — ABNORMAL LOW (ref 101–111)
Creatinine, Ser: 1 mg/dL (ref 0.44–1.00)
GFR calc Af Amer: >60 mL/min (ref >60)
GFR calc non Af Amer: 54 mL/min — ABNORMAL LOW (ref >60)
Glucose, Bld: 122 mg/dL — ABNORMAL HIGH (ref 65–99)
Potassium: 4.7 mmol/L (ref 3.5–5.1)
Sodium: 128 mmol/L — ABNORMAL LOW (ref 135–145)

## 2015-02-13 LAB — T4, FREE: FREE T4: 0.9 ng/dL (ref 0.61–1.12)

## 2015-02-13 LAB — URINE MICROSCOPIC-ADD ON

## 2015-02-13 LAB — URINALYSIS, ROUTINE W REFLEX MICROSCOPIC
Bilirubin Urine: NEGATIVE
Glucose, UA: NEGATIVE mg/dL
Hgb urine dipstick: NEGATIVE
Ketones, ur: NEGATIVE mg/dL
Leukocytes, UA: NEGATIVE
Nitrite: NEGATIVE
PH: 6 (ref 5.0–8.0)
PROTEIN: 100 mg/dL — AB
SPECIFIC GRAVITY, URINE: 1.009 (ref 1.005–1.030)
Urobilinogen, UA: 0.2 mg/dL (ref 0.0–1.0)

## 2015-02-13 LAB — I-STAT TROPONIN, ED: TROPONIN I, POC: 0 ng/mL (ref 0.00–0.08)

## 2015-02-13 LAB — IRON AND TIBC
Iron: 60 ug/dL (ref 28–170)
Saturation Ratios: 15 % (ref 10.4–31.8)
TIBC: 391 ug/dL (ref 250–450)
UIBC: 331 ug/dL

## 2015-02-13 LAB — CREATININE, SERUM
Creatinine, Ser: 0.95 mg/dL (ref 0.44–1.00)
GFR calc Af Amer: >60 mL/min (ref >60)
GFR calc non Af Amer: 58 mL/min — ABNORMAL LOW (ref >60)

## 2015-02-13 LAB — LIPID PANEL
CHOLESTEROL: 160 mg/dL (ref 0–200)
HDL: 90 mg/dL (ref >40)
LDL Cholesterol: 59 mg/dL (ref 0–99)
Total CHOL/HDL Ratio: 1.8 RATIO
Triglycerides: 56 mg/dL (ref <150)
VLDL: 11 mg/dL (ref 0–40)

## 2015-02-13 LAB — TSH: TSH: 20.314 u[IU]/mL — AB (ref 0.350–4.500)

## 2015-02-13 LAB — TROPONIN I
Troponin I: 0.06 ng/mL — ABNORMAL HIGH (ref <0.031)
Troponin I: 0.06 ng/mL — ABNORMAL HIGH (ref <0.031)

## 2015-02-13 LAB — MRSA PCR SCREENING: MRSA by PCR: NEGATIVE

## 2015-02-13 LAB — MAGNESIUM: Magnesium: 1.9 mg/dL (ref 1.7–2.4)

## 2015-02-13 LAB — D-DIMER, QUANTITATIVE (NOT AT ARMC): D-Dimer, Quant: 0.7 ug/mL-FEU — ABNORMAL HIGH (ref 0.00–0.48)

## 2015-02-13 LAB — FERRITIN: Ferritin: 129 ng/mL (ref 11–307)

## 2015-02-13 LAB — VITAMIN B12: Vitamin B-12: 338 pg/mL (ref 180–914)

## 2015-02-13 LAB — FOLATE: Folate: 11.1 ng/mL (ref >5.9)

## 2015-02-13 LAB — BRAIN NATRIURETIC PEPTIDE: B NATRIURETIC PEPTIDE 5: 472.1 pg/mL — AB (ref 0.0–100.0)

## 2015-02-13 MED ORDER — IPRATROPIUM-ALBUTEROL 0.5-2.5 (3) MG/3ML IN SOLN: 3.0000 mL | RESPIRATORY_TRACT | Status: DC

## 2015-02-13 MED ORDER — FUROSEMIDE 10 MG/ML IJ SOLN: 40.0000 mg | Freq: Two times a day (BID) | INTRAMUSCULAR | Status: DC

## 2015-02-13 MED ORDER — ACETAMINOPHEN 325 MG PO TABS: 650.0000 mg | ORAL_TABLET | ORAL | Status: DC | PRN

## 2015-02-13 MED ORDER — AMLODIPINE BESYLATE 10 MG PO TABS
10.0000 mg | ORAL_TABLET | Freq: Every day | ORAL | Status: DC
  Administered 2015-02-13 – 2015-02-16 (×4): 10 mg via ORAL
  Filled 2015-02-13 (×4): qty 1

## 2015-02-13 MED ORDER — IRBESARTAN 300 MG PO TABS
300.0000 mg | ORAL_TABLET | Freq: Every day | ORAL | Status: DC
  Administered 2015-02-13: 300 mg via ORAL
  Filled 2015-02-13 (×2): qty 1

## 2015-02-13 MED ORDER — ASPIRIN EC 81 MG PO TBEC
81.0000 mg | DELAYED_RELEASE_TABLET | Freq: Every day | ORAL | Status: DC
  Administered 2015-02-13 – 2015-02-18 (×6): 81 mg via ORAL
  Filled 2015-02-13 (×6): qty 1

## 2015-02-13 MED ORDER — FUROSEMIDE 10 MG/ML IJ SOLN
40.0000 mg | Freq: Two times a day (BID) | INTRAMUSCULAR | Status: DC
  Administered 2015-02-13 – 2015-02-14 (×3): 40 mg via INTRAVENOUS
  Filled 2015-02-13 (×3): qty 4

## 2015-02-13 MED ORDER — IOHEXOL 350 MG/ML SOLN
100.0000 mL | Freq: Once | INTRAVENOUS | Status: AC | PRN
  Administered 2015-02-13: 100 mL via INTRAVENOUS

## 2015-02-13 MED ORDER — METOPROLOL SUCCINATE ER 50 MG PO TB24
50.0000 mg | ORAL_TABLET | Freq: Every day | ORAL | Status: DC
  Administered 2015-02-13 – 2015-02-18 (×6): 50 mg via ORAL
  Filled 2015-02-13: qty 1
  Filled 2015-02-13 (×2): qty 2
  Filled 2015-02-13: qty 1
  Filled 2015-02-13: qty 2
  Filled 2015-02-13: qty 1

## 2015-02-13 MED ORDER — SODIUM CHLORIDE 0.9 % IJ SOLN
3.0000 mL | Freq: Two times a day (BID) | INTRAMUSCULAR | Status: DC
  Administered 2015-02-13 – 2015-02-18 (×11): 3 mL via INTRAVENOUS

## 2015-02-13 MED ORDER — HEPARIN SODIUM (PORCINE) 5000 UNIT/ML IJ SOLN
5000.0000 [IU] | Freq: Three times a day (TID) | INTRAMUSCULAR | Status: DC
  Administered 2015-02-13: 5000 [IU] via SUBCUTANEOUS
  Filled 2015-02-13: qty 1

## 2015-02-13 MED ORDER — SODIUM CHLORIDE 0.9 % IJ SOLN
3.0000 mL | INTRAMUSCULAR | Status: DC | PRN
  Administered 2015-02-15: 3 mL via INTRAVENOUS
  Filled 2015-02-13: qty 3

## 2015-02-13 MED ORDER — ENOXAPARIN SODIUM 40 MG/0.4ML ~~LOC~~ SOLN
40.0000 mg | SUBCUTANEOUS | Status: DC
  Administered 2015-02-13 – 2015-02-15 (×3): 40 mg via SUBCUTANEOUS
  Filled 2015-02-13 (×4): qty 0.4

## 2015-02-13 MED ORDER — DM-GUAIFENESIN ER 30-600 MG PO TB12
1.0000 | ORAL_TABLET | Freq: Two times a day (BID) | ORAL | Status: DC
  Administered 2015-02-13 – 2015-02-18 (×11): 1 via ORAL
  Filled 2015-02-13 (×13): qty 1

## 2015-02-13 MED ORDER — PANTOPRAZOLE SODIUM 20 MG PO TBEC
20.0000 mg | DELAYED_RELEASE_TABLET | Freq: Every day | ORAL | Status: DC
  Administered 2015-02-13 – 2015-02-18 (×6): 20 mg via ORAL
  Filled 2015-02-13 (×6): qty 1

## 2015-02-13 MED ORDER — FUROSEMIDE 10 MG/ML IJ SOLN
40.0000 mg | INTRAMUSCULAR | Status: AC
  Administered 2015-02-13: 40 mg via INTRAVENOUS
  Filled 2015-02-13: qty 4

## 2015-02-13 MED ORDER — ONDANSETRON HCL 4 MG/2ML IJ SOLN: 4.0000 mg | Freq: Four times a day (QID) | INTRAMUSCULAR | Status: DC | PRN

## 2015-02-13 MED ORDER — HYDRALAZINE HCL 20 MG/ML IJ SOLN
5.0000 mg | INTRAMUSCULAR | Status: DC | PRN
Start: 2015-02-13 — End: 2015-02-18

## 2015-02-13 MED ORDER — SODIUM CHLORIDE 0.9 % IV SOLN: 250.0000 mL | INTRAVENOUS | Status: DC | PRN

## 2015-02-13 MED ORDER — IPRATROPIUM-ALBUTEROL 0.5-2.5 (3) MG/3ML IN SOLN: 3.0000 mL | RESPIRATORY_TRACT | Status: DC | PRN

## 2015-02-13 NOTE — Progress Notes (Signed)
[facility] PROGRESS NOTE  Honestee Coppler [id number] DOB: 03-06-1941 DOA: 02/13/2015 PCP: No primary care provider on file.  Brief Summary  The patient is a 74 yo female with history of hypertension, GERD, asthma, thyroid disease who presented with a one week history of progressive SOB, pleuritic chest pain, lower extremity pain and edema.  She had increased her lasix but had no relief.  She was found to have a BNP of 471 and CXR demonstrated cardiac enlargement with pulmonary vascular congestion, perihilar edema, and bilateral pleural effusions consistent with acute on chronic heart failure exacerbation.  CT angio chest was negative for PE.      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  No obvious source for sepsis -  Monitor for sepsis -  Work up for hypothyroidism as below -  Check cortisol level in the AM  Pleuritic chest pain likely related to pulmonary edema.  CT angio negative for PE.  HTN: Blood pressure is elevated - amlodipine increased from 5-10 mg daily on 6/19 -continue ARB and BB and titrate as needed -IV hydralazine when necessary  GERD: stable -Protonix  Asthma: No wheezing or rales on auscultation. No acute exacerbation. -When necessary DuoNeb  - Mucinex when necessary if cough  Normocytic anemia -  Iron studies,  B12, folate pending -  TSH elevated -  Occult stool -  Repeat hgb in AM  Elevated TSH, possible hypothyroidism vs. Sick euthyroid -  Check fT4   Hyponatremia due to heart failure and possible hypothyroidism -  Diuresis and further evaluation for hypothyroidism as above -  Trend sodium  Diet:  Low sodium Access:  PIV IVF:  off Proph:  lovenox  Code Status: full Family Communication: patient  Disposition Plan: pending improvement dyspnea   Consultants:  none  Procedures:  CT angio chest on 6/19:  Neg PE  CXR 6/19:  Pulmonary edema and effusions  Antibiotics:  none   HPI/Subjective:  History obtained with [place]i interpreter from [place]:  She continues to feel somewhat SOB and have some tightness in her chest.  Her abdomen feels full and she has some nausea.   Objective: Filed Vitals:   02/13/15 0454 02/13/15 0500 02/13/15 0553 02/13/15 0622  BP: 185/104 166/55  175/58  Pulse: 60 77    Temp:    93.1 F (33.9 C)  TempSrc:    Rectal  Resp: 20 21    Weight:   61.1 kg (134 lb 11.2 oz)   SpO2: 98% 98%     No intake or output data in the 24 hours ending 02/13/15 0718 Filed Weights   02/13/15 0553  Weight: 61.1 kg (134 lb 11.2 oz)    Exam:   General:  Cachectic female, No acute distress, warming blanket in place   HEENT:  NCAT, MMM  Cardiovascular:  RRR, nl S1, S2 no mrg, 2+ pulses, warm extremities  Respiratory:  Rales to the apices bilaterally with rhonchi, no obvious wheeze   Abdomen:   NABS, soft, mildly distended, nontender  MSK:   Normal tone and bulk, 3+ bilateral pitting LEE  Neuro:  Grossly intact  Data Reviewed: Basic Metabolic Panel:  Recent Labs Lab 02/13/15 0115  NA 128*  K 4.7  CL 99*  CO2 22  GLUCOSE 122*  BUN 30*  CREATININE 1.00  CALCIUM 9.0   Liver Function Tests: No results for input(s): AST, ALT, ALKPHOS, BILITOT, PROT, ALBUMIN in the last 168 hours. No results for input(s): LIPASE, AMYLASE in the last 168  hours. No results for input(s): AMMONIA in the last 168 hours. CBC:  Recent Labs Lab 02/13/15 0115 02/13/15 0634  WBC 5.0 4.4  NEUTROABS 3.3  --   HGB 9.5* 9.1*  HCT 28.4* 27.0*  MCV 90.2 89.7  PLT 312 317   Cardiac Enzymes: No results for input(s): CKTOTAL, CKMB, CKMBINDEX, TROPONINI in the last 168 hours. BNP (last 3 results)  Recent Labs  02/13/15 0115  BNP 472.1*    ProBNP (last 3 results) No results for input(s): PROBNP in the last 8760 hours.  CBG: No results for input(s): GLUCAP in the last 168 hours.  No results found for this or any previous visit (from the past 240 hour(s)).   Studies: Dg Chest 2 View  02/13/2015   CLINICAL DATA:  Shortness of breath since yesterday.  EXAM: CHEST  2 VIEW  COMPARISON:  None.  FINDINGS: Cardiac enlargement with moderate pulmonary vascular congestion. Perihilar airspace and interstitial disease consistent with edema. Bilateral pleural effusions. No pneumothorax. Degenerative changes in the spine.  IMPRESSION: Cardiac enlargement with pulmonary vascular congestion, perihilar edema, and bilateral pleural effusions.   Electronically Signed   By: Lucienne Capers M.D.   On: 02/13/2015 01:39   Ct Angio Chest Pe W/cm &/or Wo Cm  02/13/2015   CLINICAL DATA:  Shortness of breath for 5 days with lower extremity swelling. White cell count 5. D-dimer results are not available.  EXAM: CT ANGIOGRAPHY CHEST WITH CONTRAST  TECHNIQUE: Multidetector CT imaging of the chest was performed using the standard protocol during bolus administration of intravenous contrast. Multiplanar CT image reconstructions and MIPs were obtained to evaluate the vascular anatomy.  CONTRAST:  144mL OMNIPAQUE IOHEXOL 350 MG/ML SOLN  COMPARISON:  Chest 02/13/2015  FINDINGS: Technically adequate study with good opacification of the central and segmental pulmonary arteries. No focal filling defects. No evidence of significant pulmonary embolus.  Cardiac enlargement. Normal caliber  thoracic aorta. No significant lymphadenopathy in the chest. Esophagus is mostly decompressed.  Bilateral pleural effusions. Atelectasis in the lung bases. Mild diffuse interstitial infiltration consistent with edema. Changes likely represent congestive heart failure. Fluid in the fissures. No pneumothorax. Airways appear patent.  Included portions of the upper abdominal organs are grossly unremarkable. Degenerative changes in the spine. No destructive bone lesions.  Review of the MIP images confirms the above findings.  IMPRESSION: No evidence of significant pulmonary embolus. Cardiac enlargement with pulmonary vascular congestion interstitial edema. Small bilateral pleural effusions. Findings likely represent congestive heart failure.   Electronically Signed   By: Lucienne Capers M.D.   On: 02/13/2015 05:44    Scheduled Meds: . amLODipine  10 mg Oral Daily  . aspirin EC  81 mg Oral Daily  . dextromethorphan-guaiFENesin  1 tablet Oral BID  . furosemide  40 mg Intravenous Q12H  . heparin  5,000 Units Subcutaneous 3 times per day  . ipratropium-albuterol  3 mL Nebulization Q4H  . irbesartan  300 mg Oral Daily  . metoprolol succinate  50 mg Oral Daily  . pantoprazole  20 mg Oral Daily  . sodium chloride  3 mL Intravenous Q12H   Continuous Infusions:   Principal Problem:   SOB (shortness of breath) Active Problems:   CHF (congestive heart failure)   GERD (gastroesophageal reflux disease)   Asthma   Pleuritic chest pain    Time spent: 30 min    Hollie Wojahn, [facility]  [facility] Pager [phone]. If 7PM-7AM, please contact night-coverage at [email or web address], password [facility] 02/13/2015, 7:18 AM  LOS: 0 days

## 2015-02-13 NOTE — ED Provider Notes (Signed)
CSN: [id number]     Arrival date & time 02/13/15  0054 History   First MD Initiated Contact with Patient 02/13/15 0102     Chief Complaint  Patient presents with  . Shortness of Breath     (Consider location/radiation/quality/duration/timing/severity/associated sxs/prior Treatment) HPI Comments: 74 year old female presents to the emergency department for further evaluation of shortness of breath. Patient's son reports the patient has had symptoms of the past 5 days, worsening over the last 48 hours. Patient has some chest discomfort with breathing. She also reports associated chills. Patient has been using an albuterol inhaler for symptoms without relief. Son also reports that patient was told to double her Lasix by her primary care doctor, Dr. Jeanie Cooks, but this has not been helping her. Son denies a history of heart failure, though patient is noted to be on Edarbi, Metoprolol, and Lasix. Patient has had leg swelling; son cannot indicate whether this has worsened recently. No reported fever, nausea, vomiting, syncope, or abdominal pain. Patient is not followed by a cardiologist.  Patient is a 74 y.o. female presenting with shortness of breath. The history is provided by the patient. No language interpreter was used.  Shortness of Breath Associated symptoms: chest pain   Associated symptoms: no fever and no vomiting     History reviewed. No pertinent past medical history. History reviewed. No pertinent past surgical history. No family history on file. History  Substance Use Topics  . Smoking status: Never Smoker   . Smokeless tobacco: Not on file  . Alcohol Use: No   OB History    No data available      Review of Systems  Constitutional: Positive for chills. Negative for fever.  Respiratory: Positive for shortness of breath.   Cardiovascular: Positive for chest pain and leg swelling.  Gastrointestinal: Negative for nausea and vomiting.  Neurological: Negative for syncope.  All  other systems reviewed and are negative.   Allergies  Review of patient's allergies indicates no known allergies.  Home Medications   Prior to Admission medications   Medication Sig Start Date End Date Taking? Authorizing Provider  amLODipine (NORVASC) 5 MG tablet Take 5 mg by mouth daily. 01/19/15   Historical Provider, MD  furosemide (LASIX) 20 MG tablet Take 1 tablet by mouth daily as needed. Foot swelling 02/07/15   Historical Provider, MD  metoprolol succinate (TOPROL-XL) 50 MG 24 hr tablet Take 50 mg by mouth daily. 12/14/14   Historical Provider, MD  PROAIR HFA 108 (90 BASE) MCG/ACT inhaler Inhale 2 puffs into the lungs 4 (four) times daily as needed. Shortness of breath 01/19/15   Historical Provider, MD  VESICARE 10 MG tablet Take 10 mg by mouth daily. 12/09/14   Historical Provider, MD   BP 171/62 mmHg  Pulse 79  Temp(Src) 98.4 F (36.9 C) (Oral)  Resp 26  SpO2 93%   Physical Exam  Constitutional: She is oriented to person, place, and time. She appears well-developed and well-nourished. No distress.  Patient appears uncomfortable, but nontoxic.  HENT:  Head: Normocephalic and atraumatic.  Eyes: Conjunctivae and EOM are normal. No scleral icterus.  Neck: Normal range of motion.  Cardiovascular: Normal rate, regular rhythm and intact distal pulses.   Pulmonary/Chest: Effort normal. No respiratory distress. She has no wheezes. She has no rales.  Diminished sounds in the bilateral bases. Lungs otherwise clear. Mild tachypnea noted with dyspnea. No accessory muscle use. Chest expansion symmetric.  Musculoskeletal: Normal range of motion. She exhibits edema.  3+ pitting edema  in bilateral lower extremities extending to bilateral knees.  Neurological: She is alert and oriented to person, place, and time. She exhibits normal muscle tone. Coordination normal.  Skin: Skin is warm and dry. No rash noted. She is not diaphoretic. No erythema. No pallor.  Psychiatric: She has a normal  mood and affect. Her behavior is normal.  Nursing note and vitals reviewed.   ED Course  Procedures (including critical care time) Labs Review Labs Reviewed  BASIC METABOLIC PANEL - Abnormal; Notable for the following:    Sodium 128 (*)    Chloride 99 (*)    Glucose, Bld 122 (*)    BUN 30 (*)    GFR calc non Af Amer 54 (*)    All other components within normal limits  CBC WITH DIFFERENTIAL/PLATELET - Abnormal; Notable for the following:    RBC 3.15 (*)    Hemoglobin 9.5 (*)    HCT 28.4 (*)    All other components within normal limits  BRAIN NATRIURETIC PEPTIDE - Abnormal; Notable for the following:    B Natriuretic Peptide 472.1 (*)    All other components within normal limits  I-STAT TROPOININ, ED    Imaging Review Dg Chest 2 View  02/13/2015   CLINICAL DATA:  Shortness of breath since yesterday.  EXAM: CHEST  2 VIEW  COMPARISON:  None.  FINDINGS: Cardiac enlargement with moderate pulmonary vascular congestion. Perihilar airspace and interstitial disease consistent with edema. Bilateral pleural effusions. No pneumothorax. Degenerative changes in the spine.  IMPRESSION: Cardiac enlargement with pulmonary vascular congestion, perihilar edema, and bilateral pleural effusions.   Electronically Signed   By: Lucienne Capers M.D.   On: 02/13/2015 01:39     EKG Interpretation None      MDM   Final diagnoses:  Shortness of breath  Acute congestive heart failure, unspecified congestive heart failure type    74 year old female presents to the emergency department for further evaluation of worsening shortness of breath. Patient with bilateral pitting edema. Son states that this has been worsening despite patient doubling her Lasix. Son denies a history of CHF. Chest x-ray reveals cardiac enlargement with pulmonary vascular congestion and bilateral pleural effusions. Pulmonary embolus considered, but thought to be less likely especially with history of hypertension and bilateral lower  extremity edema. Patient has had no tachycardia or hypoxia today. She does appear mildly dyspneic with mild tachypnea.  Case discussed with Dr. Radford Pax of cardiology who recommends hospitalist admission. Case discussed with [facility] Blaine Hamper of [facility] who will admit the patient for further CHF workup.   Filed Vitals:   02/13/15 0145 02/13/15 0200 02/13/15 0215 02/13/15 0222  BP:    145/57  Pulse: 74 69 71 69  Temp:      TempSrc:      Resp: 23 14 21 18   SpO2: 96% 97% 96% 96%       Antonietta Breach, PA-C 02/13/15 0300  Debby Freiberg, MD 02/17/15 [phone]

## 2015-02-13 NOTE — Progress Notes (Signed)
ANTICOAGULATION CONSULT NOTE - Initial Consult  Pharmacy Consult for enoxaparin Indication: VTE prophylaxis  No Known Allergies  Patient Measurements: Weight: 134 lb 11.2 oz (61.1 kg) Heparin Dosing Weight:   Vital Signs: Temp: 93.1 F (33.9 C) (06/19 0622) Temp Source: Rectal (06/19 0622) BP: 175/58 mmHg (06/19 0622) Pulse Rate: 77 (06/19 0500)  Labs:  Recent Labs  02/13/15 0115 02/13/15 0634  HGB 9.5* 9.1*  HCT 28.4* 27.0*  PLT 312 317  CREATININE 1.00 0.95  TROPONINI  --  0.06*    CrCl cannot be calculated (Unknown ideal weight.).   Medical History: Past Medical History  Diagnosis Date  . HTN (hypertension)   . GERD (gastroesophageal reflux disease)   . Asthma   . Thyroid disease     not know in detail   Assessment: 78 YOM presents with LE edema, SOB and chest pain. CTA of chest negative for PE.  Symptoms most likely related to acute HF.  Pharmacy asked to dose enoxaparin for VTE prophylaxis  CBC: Hgb = 9.1, pltc WNL Renal: SCr = 0.95 with CrCl > 5ml/min Weight: 61kg  Goal of Therapy:  Anti-Xa level 0.3-0.6 units/ml 4hrs after LMWH dose given   Plan:   Enoxaparin 40mg  SQ q24h - starting 8h after heparin SQ dose this morning  Will sign-off but intervene if change in CrCl w/ need for dose adjustment  Doreene Eland, PharmD, BCPS.   Pager: [phone]  02/13/2015,7:44 AM

## 2015-02-13 NOTE — ED Notes (Signed)
Called unit, Secretary stated the nurse will call back.

## 2015-02-13 NOTE — Progress Notes (Signed)
  Echocardiogram 2D Echocardiogram has been performed.  Lysle Rubens 02/13/2015, 9:53 AM

## 2015-02-13 NOTE — H&P (Addendum)
[facility] History and Physical  Katie Mosley [id number] DOB: 1941-04-16 DOA: 02/13/2015  Referring physician: ED physician PCP: No primary care provider on file.  Specialists:   Chief Complaint: Leg edema, SOB and chest pain  HPI: Katie Mosley is a 74 y.o. female with PMH of hypertension, GERD, asthma, thyroid disease, who presents with leg edema, shortness breath and chest pain.  Patient is from [place], does not speak [place]. The history is obtained through her son's translation. Patient reports that she has been having shortness breath for more than 1 week, which has significantly worsened since yesterday. She have cough, but has pleuritic chest pain. She does not have chest pain at rest, but has chest pan with deep breathing. No recent history of long distance traveling. She has bilateral lower leg pain, which is a circumferential pain, not localized to the calf areas. Patient has been using an albuterol inhaler for symptoms without relief. Son also reports that patient was told to double her Lasix by her primary care doctor, Dr. Jeanie Cooks, but this has not been helping her. Son denies a history of heart failure, though patient is noted to be on Edarbi, Metoprolol, and Lasix. Patient also has worsening leg edema bilaterally. She does not have fever, nausea, vomiting, syncope, abdominal pain, diarrhea, symptoms of UTI. Patient is not followed by a cardiologist.  In ED, patient was found to have BNP 471, negative troponin, temperature normal, heart rate 90s, electrolytes okay WBC 5.0. Hemoglobin 9.5. CXR showed cardiac enlargement with pulmonary vascular congestion, perihilar edema, and bilateral pleural effusions. Patient is admitted to inpatient for further evaluation and treatment.  Where does patient live?   At home    Can patient participate in ADLs?  Barely  Review of Systems:   General: no fevers, chills, has poor appetite, has fatigue HEENT: no blurry vision, hearing  changes or sore throat Pulm: has dyspnea, no coughing, wheezing CV: no chest pain, palpitations Abd: no nausea, vomiting, abdominal pain, diarrhea, constipation GU: no dysuria, burning on urination, increased urinary frequency, hematuria  Ext: has leg edema Neuro: no unilateral weakness, numbness, or tingling, no vision change or hearing loss Skin: no rash MSK: No muscle spasm, no deformity, no limitation of range of movement in spin Heme: No easy bruising.  Travel history: No recent long distant travel.  Allergy: No Known Allergies  Past Medical History  Diagnosis Date  . HTN (hypertension)   . GERD (gastroesophageal reflux disease)   . Asthma   . Thyroid disease     not know in detail    Past Surgical History  Procedure Laterality Date  . Back surgery      Social History:  reports that she has never smoked. She does not have any smokeless tobacco history on file. She reports that she does not drink alcohol or use illicit drugs.  Family History: Asked, but patient does not know any family medical history.  Prior to Admission medications   Medication Sig Start Date End Date Taking? Authorizing Provider  Azilsartan Medoxomil (EDARBI) 40 MG TABS Take 1 tablet by mouth daily.   Yes Historical Provider, MD  furosemide (LASIX) 20 MG tablet Take 1 tablet by mouth daily as needed. Foot swelling 02/07/15  Yes Historical Provider, MD  lansoprazole (PREVACID) 15 MG capsule Take 15 mg by mouth daily at 12 noon.   Yes Historical Provider, MD  metoprolol succinate (TOPROL-XL) 50 MG 24 hr tablet Take 50 mg by mouth daily. 12/14/14  Yes Historical Provider, MD  Pramoxine-Dimethicone (GOLD BOND INTENSIVE HEALING EX) Apply 1 application topically as needed (itchy skin).   Yes Historical Provider, MD  PROAIR HFA 108 (90 BASE) MCG/ACT inhaler Inhale 2 puffs into the lungs 4 (four) times daily as needed. Shortness of breath 01/19/15  Yes Historical Provider, MD  amLODipine (NORVASC) 5 MG tablet Take  5 mg by mouth daily. 01/19/15   Historical Provider, MD    Physical Exam: Filed Vitals:   02/13/15 0215 02/13/15 0222 02/13/15 0230 02/13/15 0300  BP:  145/57 153/73 181/61  Pulse: 71 69 76 87  Temp:  98.1 F (36.7 C)    TempSrc:  Oral    Resp: 21 18 25 23   SpO2: 96% 96% 97% 98%   General: In acute distress due to SOB HEENT:       Eyes: PERRL, EOMI, no scleral icterus.       ENT: No discharge from the ears and nose, no pharynx injection, no tonsillar enlargement.        Neck: positive JVD, no bruit, no mass felt. Heme: No neck lymph node enlargement. Cardiac: S1/S2, RRR, No murmurs, No gallops or rubs. Pulm: Has diffused rales bilaterally, no wheezing, rhonchi or rubs. Abd: Soft, nondistended, nontender, no rebound pain, no organomegaly, BS present. Ext: 3+ pitting leg edema bilaterally. 2+DP/PT pulse bilaterally. Musculoskeletal: No joint deformities, No joint redness or warmth, no limitation of ROM in spin. Skin: No rashes.  Neuro: Alert, oriented X3, cranial nerves II-XII grossly intact, muscle strength 5/5 in all extremities, sensation to light touch intact.  Psych: Patient is not psychotic, no suicidal or hemocidal ideation.  Labs on Admission:  Basic Metabolic Panel:  Recent Labs Lab 02/13/15 0115  NA 128*  K 4.7  CL 99*  CO2 22  GLUCOSE 122*  BUN 30*  CREATININE 1.00  CALCIUM 9.0   Liver Function Tests: No results for input(s): AST, ALT, ALKPHOS, BILITOT, PROT, ALBUMIN in the last 168 hours. No results for input(s): LIPASE, AMYLASE in the last 168 hours. No results for input(s): AMMONIA in the last 168 hours. CBC:  Recent Labs Lab 02/13/15 0115  WBC 5.0  NEUTROABS 3.3  HGB 9.5*  HCT 28.4*  MCV 90.2  PLT 312   Cardiac Enzymes: No results for input(s): CKTOTAL, CKMB, CKMBINDEX, TROPONINI in the last 168 hours.  BNP (last 3 results)  Recent Labs  02/13/15 0115  BNP 472.1*    ProBNP (last 3 results) No results for input(s): PROBNP in the last  8760 hours.  CBG: No results for input(s): GLUCAP in the last 168 hours.  Radiological Exams on Admission: Dg Chest 2 View  02/13/2015   CLINICAL DATA:  Shortness of breath since yesterday.  EXAM: CHEST  2 VIEW  COMPARISON:  None.  FINDINGS: Cardiac enlargement with moderate pulmonary vascular congestion. Perihilar airspace and interstitial disease consistent with edema. Bilateral pleural effusions. No pneumothorax. Degenerative changes in the spine.  IMPRESSION: Cardiac enlargement with pulmonary vascular congestion, perihilar edema, and bilateral pleural effusions.   Electronically Signed   By: Lucienne Capers M.D.   On: 02/13/2015 01:39    EKG: Independently reviewed.  Abnormal findings: No ischemic change  Assessment/Plan Principal Problem:   SOB (shortness of breath) Active Problems:   CHF (congestive heart failure)   GERD (gastroesophageal reflux disease)   Asthma   Pleuritic chest pain  SOB: It is most likely caused by acute congestive heart failure. Patient may have undiagnosed congestive heart failure. She has been on ARB, BB and lasix  at home. Now presents with the severe fluid overload, positive JVD and no vascular congestion the chest x-ray, consistent service  congestive heart failure. Patient does not have cough, but has pleuritic chest pain, need to rule out possibility of pulmonary embolism.  -will admit to SDU -received one dose of lasix 40 mg  X 1 by IV in ED. Will hold lasix until r/o PE. -start ASA  -Irbesant and metoprolol -will cycle CE X3 -will check TSH, A1c, FLP -will get 2-D echo to evaluate EF -NPO now, will need to start heart diet with salt restriction later -strict In/Out -Daily body weight. -check D-dimer, if positive, will do CTA to r/o PE. -ABG stat  Addendum: D-dimer positive, but CTA negative for PE. -will start Lasix 40 mg bid, next dose at 10:00 AM.  Pleuritic chest pain: -r/o PE as above  HTN: Blood pressure is elevated on  admission -Increase amlodipine from 5-10 mg daily -Switch Benicar to irbesartan -IV hydralazine when necessary  GERD: -Protonix  Asthma: No wheezing or rales on auscultation. No acute exacerbation. -When necessary DuoNeb  -Mucinex when necessary if cough   DVT ppx: SQ Heparin    Code Status: Full code Family Communication:  Yes, patient's   son    at bed side Disposition Plan: Admit to inpatient   Date of Service 02/13/2015    Ivor Costa [facility] Pager [phone]  If 7PM-7AM, please contact night-coverage [email or web address] Password [email or web address] 02/13/2015, 3:35 AM

## 2015-02-13 NOTE — ED Notes (Signed)
Patient assisted to bedside commode, patient assisted in return to bed. Patient placed back on cardiac monitor, vital signs checked.

## 2015-02-13 NOTE — ED Notes (Signed)
Pt's family reports SOB since yesterday.

## 2015-02-14 DIAGNOSIS — I5031 Acute diastolic (congestive) heart failure: Secondary | ICD-10-CM

## 2015-02-14 DIAGNOSIS — E875 Hyperkalemia: Secondary | ICD-10-CM

## 2015-02-14 DIAGNOSIS — R7989 Other specified abnormal findings of blood chemistry: Secondary | ICD-10-CM

## 2015-02-14 DIAGNOSIS — I272 Pulmonary hypertension, unspecified: Secondary | ICD-10-CM

## 2015-02-14 LAB — CBC
HEMATOCRIT: 25.3 % — AB (ref 36.0–46.0)
Hemoglobin: 8.4 g/dL — ABNORMAL LOW (ref 12.0–15.0)
MCH: 29.6 pg (ref 26.0–34.0)
MCHC: 33.2 g/dL (ref 30.0–36.0)
MCV: 89.1 fL (ref 78.0–100.0)
Platelets: 271 10*3/uL (ref 150–400)
RBC: 2.84 MIL/uL — ABNORMAL LOW (ref 3.87–5.11)
RDW: 14.6 % (ref 11.5–15.5)
WBC: 3.4 10*3/uL — ABNORMAL LOW (ref 4.0–10.5)

## 2015-02-14 LAB — BASIC METABOLIC PANEL
ANION GAP: 8 (ref 5–15)
Anion gap: 8 (ref 5–15)
BUN: 34 mg/dL — ABNORMAL HIGH (ref 6–20)
BUN: 34 mg/dL — AB (ref 6–20)
CALCIUM: 8.9 mg/dL (ref 8.9–10.3)
CO2: 26 mmol/L (ref 22–32)
CO2: 27 mmol/L (ref 22–32)
CREATININE: 1.32 mg/dL — AB (ref 0.44–1.00)
Calcium: 8.8 mg/dL — ABNORMAL LOW (ref 8.9–10.3)
Chloride: 95 mmol/L — ABNORMAL LOW (ref 101–111)
Chloride: 96 mmol/L — ABNORMAL LOW (ref 101–111)
Creatinine, Ser: 1.2 mg/dL — ABNORMAL HIGH (ref 0.44–1.00)
GFR calc Af Amer: 45 mL/min — ABNORMAL LOW (ref >60)
GFR calc non Af Amer: 43 mL/min — ABNORMAL LOW (ref >60)
GFR calc non Af Amer: 39 mL/min — ABNORMAL LOW (ref >60)
GFR, EST AFRICAN AMERICAN: 50 mL/min — AB (ref >60)
Glucose, Bld: 89 mg/dL (ref 65–99)
Glucose, Bld: 107 mg/dL — ABNORMAL HIGH (ref 65–99)
POTASSIUM: 6 mmol/L — AB (ref 3.5–5.1)
Potassium: 4.8 mmol/L (ref 3.5–5.1)
Sodium: 129 mmol/L — ABNORMAL LOW (ref 135–145)
Sodium: 131 mmol/L — ABNORMAL LOW (ref 135–145)

## 2015-02-14 LAB — HEMOGLOBIN A1C
Hgb A1c MFr Bld: 6 % — ABNORMAL HIGH (ref 4.8–5.6)
Mean Plasma Glucose: 126 mg/dL

## 2015-02-14 LAB — CORTISOL-AM, BLOOD: Cortisol - AM: 8.9 ug/dL (ref 6.7–22.6)

## 2015-02-14 MED ORDER — HYDRALAZINE HCL 10 MG PO TABS
10.0000 mg | ORAL_TABLET | Freq: Three times a day (TID) | ORAL | Status: DC
  Administered 2015-02-14 – 2015-02-18 (×10): 10 mg via ORAL
  Filled 2015-02-14 (×15): qty 1

## 2015-02-14 MED ORDER — SODIUM POLYSTYRENE SULFONATE 15 GM/60ML PO SUSP
45.0000 g | Freq: Once | ORAL | Status: AC
  Administered 2015-02-14: 45 g via ORAL
  Filled 2015-02-14: qty 180

## 2015-02-14 MED ORDER — INSULIN ASPART 100 UNIT/ML IV SOLN
10.0000 [IU] | Freq: Once | INTRAVENOUS | Status: AC
  Administered 2015-02-14: 10 [IU] via INTRAVENOUS
  Filled 2015-02-14: qty 0.1

## 2015-02-14 MED ORDER — CALCIUM GLUCONATE 10 % IV SOLN
1.0000 g | Freq: Once | INTRAVENOUS | Status: AC
  Administered 2015-02-14: 1 g via INTRAVENOUS
  Filled 2015-02-14: qty 10

## 2015-02-14 MED ORDER — LEVOTHYROXINE SODIUM 25 MCG PO TABS
50.0000 ug | ORAL_TABLET | Freq: Every day | ORAL | Status: DC
Start: 2015-02-14 — End: 2015-02-18
  Administered 2015-02-14 – 2015-02-18 (×5): 50 ug via ORAL
  Filled 2015-02-14 (×2): qty 1
  Filled 2015-02-14: qty 2
  Filled 2015-02-14 (×2): qty 1
  Filled 2015-02-14: qty 2

## 2015-02-14 MED ORDER — FUROSEMIDE 10 MG/ML IJ SOLN
60.0000 mg | Freq: Two times a day (BID) | INTRAMUSCULAR | Status: DC
  Administered 2015-02-14 – 2015-02-15 (×2): 60 mg via INTRAVENOUS
  Filled 2015-02-14 (×2): qty 6

## 2015-02-14 MED ORDER — ISOSORBIDE MONONITRATE ER 30 MG PO TB24
15.0000 mg | ORAL_TABLET | Freq: Every day | ORAL | Status: DC
  Administered 2015-02-14 – 2015-02-18 (×5): 15 mg via ORAL
  Filled 2015-02-14 (×5): qty 1

## 2015-02-14 MED ORDER — DEXTROSE 50 % IV SOLN
1.0000 | Freq: Once | INTRAVENOUS | Status: AC
  Administered 2015-02-14: 50 mL via INTRAVENOUS
  Filled 2015-02-14: qty 50

## 2015-02-14 NOTE — Care Management Note (Signed)
Case Management Note  Patient Details  Name: Jonnae Murai MRN: [id number] Date of Birth: [birth year]  Subjective/Objective:                 chf   Action/Plan: home management   Expected Discharge Date:      [id number]            Expected Discharge Plan:  Home/Self Care  In-House Referral:  NA, Interpreting Services  Discharge planning Services  CM Consult  Post Acute Care Choice:  Home Health Choice offered to:  Patient  DME Arranged:  N/A DME Agency:  NA  HH Arranged:  Disease Management [place] Agency:  [place]  Status of Service:  In process, will continue to follow  Medicare Important Message Given:    Date Medicare IM Given:    Medicare IM give by:    Date Additional Medicare IM Given:    Additional Medicare Important Message give by:     If discussed at [place] of Stay Meetings, dates discussed:    Additional Comments:  Leeroy Cha, RN 02/14/2015, 11:48 AM

## 2015-02-14 NOTE — Progress Notes (Signed)
CRITICAL VALUE ALERT  Critical value received: K= 6  Date of notification:  02/14/15  Time of notification:  [id number]  Critical value read back:Yes.    Nurse who received alert:  Fara Olden, RN  MD notified (1st page):  Lamar Blinks, NP  Time of first page:  [phone]  MD notified (2nd page):  Time of second page:  Responding MD:  Lamar Blinks, NP, order for STAT EKG received  Time MD responded: [phone]

## 2015-02-14 NOTE — Progress Notes (Signed)
Date:  February 14, 2015 U.R. performed for needs and level of care. Congestive heart failure with pulmonary hypertension. [facility] notified of home heart failure program screening. Will continue to follow for Case Management needs.  Velva Harman, RN, BSN, [place]   [phone]

## 2015-02-14 NOTE — Evaluation (Signed)
Physical Therapy Evaluation Patient Details Name: Katie Mosley MRN: [id number] DOB: 1940/09/15 Today's Date: 02/14/2015   History of Present Illness  Pt admitted with acute hypoxic resp failure likely secondary to acute on chronic CHF exacerbation  Clinical Impression  Pt admitted as above and presenting with functional mobility limitations 2* generalized weakness and ambulatory instability.  Pt should progress to dc home with 24/7 family assist and follow up HHPT dependent on acute stay progress.    Follow Up Recommendations No PT follow up;Home health PT (dependent on acute stay progress)    Equipment Recommendations  None recommended by PT    Recommendations for Other Services OT consult     Precautions / Restrictions Precautions Precautions: Fall Restrictions Weight Bearing Restrictions: No      Mobility  Bed Mobility Overal bed mobility: Modified Independent             General bed mobility comments: Pt moved to EOB without assist  Transfers Overall transfer level: Needs assistance Equipment used: None Transfers: Sit to/from Stand Sit to Stand: Min assist         General transfer comment: min assist to rise and steady  Ambulation/Gait Ambulation/Gait assistance: Min assist Ambulation Distance (Feet): 222 Feet Assistive device: Rolling walker (2 wheeled);1 person hand held assist Gait Pattern/deviations: Step-through pattern;Decreased step length - right;Decreased step length - left;Shuffle;Wide base of support Gait velocity: decr Gait velocity interpretation: Below normal speed for age/gender General Gait Details: Pt ambulated 50' with RW, min assist and frequenct cues to move closer to RW.  Pt ambulated additional 150' with min HHA for stability.  Noted improvement in stability and stride length with increased distance ambulated  Stairs            Wheelchair Mobility    Modified Rankin (Stroke Patients Only)       Balance                                              Pertinent Vitals/Pain Pain Assessment: No/denies pain    Home Living Family/patient expects to be discharged to:: Private residence Living Arrangements: Children Available Help at Discharge: Family;Available 24 hours/day Type of Home: House Home Access: Stairs to enter Entrance Stairs-Rails: Right;Left;Can reach both Entrance Stairs-Number of Steps: 3 Home Layout: One level Home Equipment: None      Prior Function Level of Independence: Independent               Hand Dominance        Extremity/Trunk Assessment   Upper Extremity Assessment: Generalized weakness           Lower Extremity Assessment: Generalized weakness         Communication   Communication: Prefers language other than English;Interpreter utilized Advertising account executive acted as Astronomer)  Cognition Arousal/Alertness: Awake/alert Behavior During Therapy: WFL for tasks assessed/performed Overall Cognitive Status: Difficult to assess                      General Comments      Exercises        Assessment/Plan    PT Assessment Patient needs continued PT services  PT Diagnosis Difficulty walking;Generalized weakness   PT Problem List Decreased strength;Decreased activity tolerance;Decreased balance;Decreased mobility;Decreased knowledge of use of DME  PT Treatment Interventions DME instruction;Gait training;Stair training;Functional mobility training;Therapeutic activities;Therapeutic exercise;Patient/family education   PT Goals (  Current goals can be found in the Care Plan section) Acute Rehab PT Goals Patient Stated Goal: HOME PT Goal Formulation: With patient Time For Goal Achievement: 02/28/15 Potential to Achieve Goals: Good    Frequency Min 3X/week   Barriers to discharge        Co-evaluation               End of Session Equipment Utilized During Treatment: Gait belt Activity Tolerance: Patient tolerated  treatment well Patient left: in chair;with call bell/phone within reach;with family/visitor present Nurse Communication: Mobility status         Time: [id number] PT Time Calculation (min) (ACUTE ONLY): 27 min   Charges:   PT Evaluation $Initial PT Evaluation Tier I: 1 Procedure PT Treatments $Gait Training: 8-22 mins   PT G Codes:        Katie Mosley March 02, 2015, 11:04 AM

## 2015-02-14 NOTE — Progress Notes (Signed)
[facility] PROGRESS NOTE  Vanissa Gagnon [id number] DOB: [birth year] DOA: 02/13/2015 PCP: No primary care provider on file.  Brief Summary  The patient is a 74 yo female with history of hypertension, GERD, asthma, thyroid disease who presented with a one week history of progressive SOB, pleuritic chest pain, lower extremity pain and edema.  She had increased her lasix but had no relief.  She was found to have a BNP of 471 and CXR demonstrated cardiac enlargement with pulmonary vascular congestion, perihilar edema, and bilateral pleural effusions consistent with acute on chronic heart failure exacerbation.  CT angio chest was negative for PE.      Assessment/Plan  Acute hypoxic respiratory failure likely secondary to acute on chronic diastolic CHF exacerbation and possible hypothyroidism - advance to low sodium diet - continue ASA  - d/c Irbesartan secondary to hyperkalemia -  continue metoprolol - troponins mildly elevated but flat, ACS unlikely - TSH elevated, see below - A1c pending - Lipid panel is excellent - ECHO:  EF 55-60% with grade 1 DD and evidence of high filling pressures, moderate TR with peak PA pressure estimated 76 mmHg with flattened septum and blunted IVC respirophasic changes c/w elevated CVP - strict In/Out -557 - Daily weight stable  Pulmonary hypertension may be due to diastolic heart failure -  Diuresis as above  Hypothermia may be related to hypothyroidism.  No obvious source for sepsis -  Monitor for sepsis -  Work up for hypothyroidism as below -  Follow-up cortisol level -  Temperature has been stable after removal of bear hunger  Pleuritic chest pain likely related to pulmonary edema.  CT angio negative for PE.  HTN: Blood pressure is elevated - amlodipine increased from 5 to 10 mg daily on 6/19 -  D/c ARB  - BB recently increased -  Start hydralazine and Imdur -  Continue IV hydralazine  GERD: stable -Protonix  Asthma: No wheezing  or rales on auscultation. No acute exacerbation. -When necessary DuoNeb  - Mucinex when necessary if cough  Normocytic anemia, likely due to chronic disease, hemoglobin drifting down -  Iron studies, B12, folate within normal limits -  TSH elevated -  Occult stool pending -  Repeat hgb in AM -  Check HIV  Elevated TSH, possible hypothyroidism vs. Sick euthyroid -  fT4 lower end of normal -  Start levothyroxine 50 mcg daily   Hyponatremia due to heart failure and possible hypothyroidism, stable to mildly improved -  Diuresis and further evaluation for hypothyroidism as above -  Trend sodium  Hyperkalemia, likely secondary to ARB. The patient reported that she had been taking and ARB at home, however perhaps she has been noncompliant with her medications. She did not receive any potassium supplementation and she does not appear to have any metabolic acidosis EKG with mildly peaked T waves - Calcium gluconate IV once, he 50 and insulin -  Patient already received Kayexalate -  Continue Lasix -  Repeat BMP at noon, and if potassium is trending down she may transfer to telemetry    Diet:  Low sodium Access:  PIV IVF:  off Proph:  lovenox  Code Status: full Family Communication: patient  Disposition Plan: pending improvement potassium transfer to telemetry.  Awaiting improvement in rales and lower extremity edema   Consultants:  none  Procedures:  CT angio chest on 6/19:  Neg PE  CXR 6/19:  Pulmonary edema and effusions  ECHO  Antibiotics:  none   HPI/Subjective:  History  obtained with Somali interpreter from [place]:  she continues to have pain and swelling in her legs which is marginally improved. She denies shortness of breath today.  Objective: Filed Vitals:   02/14/15 0200 02/14/15 0341 02/14/15 0400 02/14/15 0600  BP: 143/40  139/44 158/58  Pulse: 65  64 64  Temp: 98.2 F (36.8 C) 98 F (36.7 C) 98.2 F (36.8 C) 98.2 F (36.8 C)  TempSrc:  Oral     Resp: 16  21 21   Height:      Weight:  61.8 kg (136 lb 3.9 oz)    SpO2: 96%  95% 95%    Intake/Output Summary (Last 24 hours) at 02/14/15 0759 Last data filed at 02/14/15 0646  Gross per 24 hour  Intake   1443 ml  Output   2000 ml  Net   -557 ml   Filed Weights   02/13/15 0553 02/14/15 0341  Weight: 61.1 kg (134 lb 11.2 oz) 61.8 kg (136 lb 3.9 oz)    Exam:   General:  Cachectic female, No acute distress  HEENT:  NCAT, MMM  Cardiovascular:  RRR, nl S1, S2 no mrg, 2+ pulses, warm extremities  Respiratory:  Rales to the mid-back with rhonchi, no obvious wheeze   Abdomen:   NABS, soft, mildly distended, nontender  MSK:   Normal tone and bulk, 2+ bilateral pitting LEE  Neuro:  Grossly intact  Data Reviewed: Basic Metabolic Panel:  Recent Labs Lab 02/13/15 0115 02/13/15 0634 02/14/15 0355  NA 128*  --  129*  K 4.7  --  6.0*  CL 99*  --  95*  CO2 22  --  26  GLUCOSE 122*  --  89  BUN 30*  --  34*  CREATININE 1.00 0.95 1.20*  CALCIUM 9.0  --  8.8*  MG  --  1.9  --    Liver Function Tests: No results for input(s): AST, ALT, ALKPHOS, BILITOT, PROT, ALBUMIN in the last 168 hours. No results for input(s): LIPASE, AMYLASE in the last 168 hours. No results for input(s): AMMONIA in the last 168 hours. CBC:  Recent Labs Lab 02/13/15 0115 02/13/15 0634 02/14/15 0355  WBC 5.0 4.4 3.4*  NEUTROABS 3.3  --   --   HGB 9.5* 9.1* 8.4*  HCT 28.4* 27.0* 25.3*  MCV 90.2 89.7 89.1  PLT 312 317 271   Cardiac Enzymes:  Recent Labs Lab 02/13/15 0634 02/13/15 1140  TROPONINI 0.06* 0.06*   BNP (last 3 results)  Recent Labs  02/13/15 0115  BNP 472.1*    ProBNP (last 3 results) No results for input(s): PROBNP in the last 8760 hours.  CBG: No results for input(s): GLUCAP in the last 168 hours.  Recent Results (from the past 240 hour(s))  MRSA PCR Screening     Status: None   Collection Time: 02/13/15 12:57 AM  Result Value Ref Range Status   MRSA by PCR  NEGATIVE NEGATIVE Final    Comment:        The GeneXpert MRSA Assay (FDA approved for NASAL specimens only), is one component of a comprehensive MRSA colonization surveillance program. It is not intended to diagnose MRSA infection nor to guide or monitor treatment for MRSA infections.      Studies: Dg Chest 2 View  02/13/2015   CLINICAL DATA:  Shortness of breath since yesterday.  EXAM: CHEST  2 VIEW  COMPARISON:  None.  FINDINGS: Cardiac enlargement with moderate pulmonary vascular congestion. Perihilar airspace and interstitial disease  consistent with edema. Bilateral pleural effusions. No pneumothorax. Degenerative changes in the spine.  IMPRESSION: Cardiac enlargement with pulmonary vascular congestion, perihilar edema, and bilateral pleural effusions.   Electronically Signed   By: Lucienne Capers M.D.   On: 02/13/2015 01:39   Ct Angio Chest Pe W/cm &/or Wo Cm  02/13/2015   CLINICAL DATA:  Shortness of breath for 5 days with lower extremity swelling. White cell count 5. D-dimer results are not available.  EXAM: CT ANGIOGRAPHY CHEST WITH CONTRAST  TECHNIQUE: Multidetector CT imaging of the chest was performed using the standard protocol during bolus administration of intravenous contrast. Multiplanar CT image reconstructions and MIPs were obtained to evaluate the vascular anatomy.  CONTRAST:  132mL OMNIPAQUE IOHEXOL 350 MG/ML SOLN  COMPARISON:  Chest 02/13/2015  FINDINGS: Technically adequate study with good opacification of the central and segmental pulmonary arteries. No focal filling defects. No evidence of significant pulmonary embolus.  Cardiac enlargement. Normal caliber thoracic aorta. No significant lymphadenopathy in the chest. Esophagus is mostly decompressed.  Bilateral pleural effusions. Atelectasis in the lung bases. Mild diffuse interstitial infiltration consistent with edema. Changes likely represent congestive heart failure. Fluid in the fissures. No pneumothorax. Airways  appear patent.  Included portions of the upper abdominal organs are grossly unremarkable. Degenerative changes in the spine. No destructive bone lesions.  Review of the MIP images confirms the above findings.  IMPRESSION: No evidence of significant pulmonary embolus. Cardiac enlargement with pulmonary vascular congestion interstitial edema. Small bilateral pleural effusions. Findings likely represent congestive heart failure.   Electronically Signed   By: Lucienne Capers M.D.   On: 02/13/2015 05:44    Scheduled Meds: . amLODipine  10 mg Oral Daily  . aspirin EC  81 mg Oral Daily  . calcium gluconate  1 g Intravenous Once  . dextromethorphan-guaiFENesin  1 tablet Oral BID  . dextrose  1 ampule Intravenous Once  . enoxaparin (LOVENOX) injection  40 mg Subcutaneous Q24H  . furosemide  60 mg Intravenous Q12H  . hydrALAZINE  10 mg Oral 3 times per day  . insulin aspart  10 Units Intravenous Once  . isosorbide mononitrate  15 mg Oral Daily  . levothyroxine  50 mcg Oral QAC breakfast  . metoprolol succinate  50 mg Oral Daily  . pantoprazole  20 mg Oral Daily  . sodium chloride  3 mL Intravenous Q12H  . sodium polystyrene  45 g Oral Once   Continuous Infusions:   Principal Problem:   SOB (shortness of breath) Active Problems:   CHF (congestive heart failure)   GERD (gastroesophageal reflux disease)   Asthma   Pleuritic chest pain    Time spent: 30 min    Ayshia Gramlich, [facility] Pager [phone]. If 7PM-7AM, please contact night-coverage at [email or web address], password [facility] 02/14/2015, 7:59 AM  LOS: 1 day

## 2015-02-15 ENCOUNTER — Inpatient Hospital Stay (HOSPITAL_COMMUNITY): Payer: Medicare Other

## 2015-02-15 DIAGNOSIS — R946 Abnormal results of thyroid function studies: Secondary | ICD-10-CM

## 2015-02-15 DIAGNOSIS — I270 Primary pulmonary hypertension: Secondary | ICD-10-CM

## 2015-02-15 DIAGNOSIS — T68XXXA Hypothermia, initial encounter: Secondary | ICD-10-CM

## 2015-02-15 LAB — CBC
HCT: 26 % — ABNORMAL LOW (ref 36.0–46.0)
Hemoglobin: 8.6 g/dL — ABNORMAL LOW (ref 12.0–15.0)
MCH: 29.3 pg (ref 26.0–34.0)
MCHC: 33.1 g/dL (ref 30.0–36.0)
MCV: 88.4 fL (ref 78.0–100.0)
PLATELETS: 276 10*3/uL (ref 150–400)
RBC: 2.94 MIL/uL — AB (ref 3.87–5.11)
RDW: 14.8 % (ref 11.5–15.5)
WBC: 4.7 10*3/uL (ref 4.0–10.5)

## 2015-02-15 LAB — BASIC METABOLIC PANEL
Anion gap: 10 (ref 5–15)
BUN: 39 mg/dL — ABNORMAL HIGH (ref 6–20)
CALCIUM: 8.3 mg/dL — AB (ref 8.9–10.3)
CO2: 28 mmol/L (ref 22–32)
Chloride: 94 mmol/L — ABNORMAL LOW (ref 101–111)
Creatinine, Ser: 1.42 mg/dL — ABNORMAL HIGH (ref 0.44–1.00)
GFR calc Af Amer: 41 mL/min — ABNORMAL LOW (ref >60)
GFR, EST NON AFRICAN AMERICAN: 35 mL/min — AB (ref >60)
GLUCOSE: 86 mg/dL (ref 65–99)
Potassium: 4.3 mmol/L (ref 3.5–5.1)
SODIUM: 132 mmol/L — AB (ref 135–145)

## 2015-02-15 LAB — HIV ANTIBODY (ROUTINE TESTING W REFLEX): HIV SCREEN 4TH GENERATION: NONREACTIVE

## 2015-02-15 MED ORDER — SODIUM CHLORIDE 0.9 % IV SOLN
1.5000 g | Freq: Two times a day (BID) | INTRAVENOUS | Status: DC
  Administered 2015-02-15 – 2015-02-16 (×3): 1.5 g via INTRAVENOUS
  Filled 2015-02-15 (×4): qty 1.5

## 2015-02-15 MED ORDER — FUROSEMIDE 40 MG PO TABS
40.0000 mg | ORAL_TABLET | Freq: Every day | ORAL | Status: DC
  Administered 2015-02-16: 40 mg via ORAL
  Filled 2015-02-15: qty 1

## 2015-02-15 NOTE — Evaluation (Signed)
Clinical/Bedside Swallow Evaluation Patient Details  Name: Katie Mosley MRN: [id number] Date of Birth: [birth year]  Today's Date: 02/15/2015 Time: SLP Start Time (ACUTE ONLY): 1152 SLP Stop Time (ACUTE ONLY): 1202 SLP Time Calculation (min) (ACUTE ONLY): 10 min  Past Medical History:  Past Medical History  Diagnosis Date  . HTN (hypertension)   . GERD (gastroesophageal reflux disease)   . Asthma   . Thyroid disease     not know in detail   Past Surgical History:  Past Surgical History  Procedure Laterality Date  . Back surgery     HPI:  74 y.o. female with PMH of hypertension, GERD, asthma, thyroid disease, admitted with leg edema, shortness breath and chest pain. CXR Continued interstitial edema/ CHF pattern. Improving bibasilar opacities and small effusions.   Assessment / Plan / Recommendation Clinical Impression  Pt's grandson present and assisted to interpret when needed. Oropharyngeal swallow WFL's without indication of impairments. Aspiration risk appears low without history of stroke, recent intubation or history of GERD. Recommend continue regular consistency and thin liquids. No follow up ST needed.      Aspiration Risk  Mild    Diet Recommendation Thin (regular)   Medication Administration: Whole meds with liquid Compensations: Slow rate;Small sips/bites    Other  Recommendations Oral Care Recommendations: Oral care BID   Follow Up Recommendations       Frequency and Duration        Pertinent Vitals/Pain none         Swallow Study          Oral/Motor/Sensory Function Overall Oral Motor/Sensory Function: Appears within functional limits for tasks assessed   Ice Chips Ice chips: Not tested   Thin Liquid Thin Liquid: Within functional limits Presentation: Cup;Straw    Nectar Thick Nectar Thick Liquid: Not tested   Honey Thick Honey Thick Liquid: Not tested   Puree Puree: Not tested   Solid   GO    Solid: Within functional limits        Houston Siren 02/15/2015,12:55 PM  Orbie Pyo [place].[employer] [phone]

## 2015-02-15 NOTE — Progress Notes (Addendum)
ANTIBIOTIC CONSULT NOTE - INITIAL  Pharmacy Consult for Unasyn Indication: aspiration pneumonia  No Known Allergies  Patient Measurements: Height: 5\' 1"  (154.9 cm) Weight: 134 lb 11.2 oz (61.1 kg) IBW/kg (Calculated) : 47.8  Vital Signs: Temp: 98.8 F (37.1 C) (06/21 0800) Temp Source: Core (Comment) (06/21 0800) BP: 156/42 mmHg (06/21 0800) Pulse Rate: 76 (06/21 0800) Intake/Output from previous day: 06/20 0701 - 06/21 0700 In: 910 [P.O.:800; I.V.:10; IV Piggyback:100] Out: 1050 [Urine:1050] Intake/Output from this shift:    Labs:  Recent Labs  02/13/15 0634 02/14/15 0355 02/14/15 1336 02/15/15 0335  WBC 4.4 3.4*  --  4.7  HGB 9.1* 8.4*  --  8.6*  PLT 317 271  --  276  CREATININE 0.95 1.20* 1.32* 1.42*   Estimated Creatinine Clearance: 29.1 mL/min (by C-G formula based on Cr of 1.42). No results for input(s): VANCOTROUGH, VANCOPEAK, VANCORANDOM, GENTTROUGH, GENTPEAK, GENTRANDOM, TOBRATROUGH, TOBRAPEAK, TOBRARND, AMIKACINPEAK, AMIKACINTROU, AMIKACIN in the last 72 hours.   Microbiology: Recent Results (from the past 720 hour(s))  MRSA PCR Screening     Status: None   Collection Time: 02/13/15 12:57 AM  Result Value Ref Range Status   MRSA by PCR NEGATIVE NEGATIVE Final    Comment:        The GeneXpert MRSA Assay (FDA approved for NASAL specimens only), is one component of a comprehensive MRSA colonization surveillance program. It is not intended to diagnose MRSA infection nor to guide or monitor treatment for MRSA infections.     Medical History: Past Medical History  Diagnosis Date  . HTN (hypertension)   . GERD (gastroesophageal reflux disease)   . Asthma   . Thyroid disease     not know in detail    Medications:  Anti-infectives    None     Assessment: 74 y.o. female with history of hypertension, GERD, asthma, thyroid disease who admitted 6/19 for CHF exacerbation.  MD thinks may also have aspiration pneumonia given recurrent hypothermia,  cough, and rales despite evidence that she has diuresed.  To begin Unasyn per pharmacy.  6/21 >> Unasyn >>  Tmax: afebrile, some hypothermia WBCs: wnl-low Renal: SCr increased following diuresis; CrCl 29 CG CT/CXR clear  Blood: IP MRSA negative  Drug level / dose changes info:   Goal of Therapy:  Eradication of infection Appropriate antibiotic dosing for indication and renal function  Plan:  Day 1 antibiotics  Unasyn 1.5 g IV q12 hr  Follow clinical course, renal function, culture results as available  Follow for de-escalation of antibiotics and LOT   Reuel Boom, PharmD Pager: [phone] 02/15/2015, 9:36 AM

## 2015-02-15 NOTE — Progress Notes (Addendum)
[facility] PROGRESS NOTE  Katie Mosley [id number] DOB: [birth year] DOA: 02/13/2015 PCP: No primary care provider on file.  Brief Summary  The patient is a 74 yo female with history of hypertension, GERD, asthma, thyroid disease who presented with a one week history of progressive SOB, pleuritic chest pain, lower extremity pain and edema.  She had increased her lasix but had no relief.  She was found to have a BNP of 471 and CXR demonstrated cardiac enlargement with pulmonary vascular congestion, perihilar edema, and bilateral pleural effusions consistent with acute on chronic heart failure exacerbation.  CT angio chest was negative for PE.   She was diuresed but now her creatinine is rising and she continues to have rales on exam.  Additionally, she has had recurrent hypothermia requiring warming blanket.  I have started treatment for possible aspiration pneumonia to see if that may help with her labile temperature but if not, she may need MRI brain.    Assessment/Plan  Acute hypoxic respiratory failure likely secondary to acute on chronic diastolic CHF exacerbation and possible hypothyroidism.  May also have aspiration pneumonia given recurrent hypothermia and rales despite evidence that she has diuresed.  She also has a weak cough.   - advance to low sodium diet - continue ASA  -  continue metoprolol - troponins mildly elevated but flat, ACS unlikely - TSH elevated, see below - A1c 6 - Lipid panel is excellent - ECHO:  EF 55-60% with grade 1 DD and evidence of high filling pressures, moderate TR with peak PA pressure estimated 76 mmHg with flattened septum and blunted IVC respirophasic changes c/w elevated CVP - strict In/Out -557 - Daily weight stable - d/c IV lasix given rising creatinine and start oral lasix -  Consider pulmonary-renal syndrome  Pulmonary hypertension may be due to diastolic heart failure -  Diuresis as above  Hypothermia may be related to  hypothyroidism.  Repeat CXR to eval for aspiration pneumonia:  Improving but persistent lower lobe opacities.  I will start empiric unasyn.  -  Work up for hypothyroidism as below, started synthroid -  Cortisol level wnl -  Had second dip in temperature on 6/20 -  Blood cultures pending -  If she has recurrent hypothermia, check MRI brain.  Pleuritic chest pain likely related to pulmonary edema.  CT angio negative for PE.  HTN: Blood pressure is elevated - amlodipine increased from 5 to 10 mg daily on 6/19 -  D/c ARB  -  BB recently increased -  Continue hydralazine and Imdur -  Continue IV hydralazine  GERD: stable -Protonix  Asthma: No wheezing or rales on auscultation. No acute exacerbation. -When necessary DuoNeb  - Mucinex when necessary if cough  Normocytic anemia, likely due to chronic disease, hemoglobin drifting down -  Iron studies, B12, folate within normal limits -  TSH elevated -  Occult stool pending -  Repeat hgb in AM -  HIV pending  Elevated TSH, possible hypothyroidism vs. Sick euthyroid -  fT4 lower end of normal -  Start levothyroxine 50 mcg daily   Hyponatremia due to heart failure and possible hypothyroidism, improving somewhat with diuresis -  Diuresis and further evaluation for hypothyroidism as above -  Trend sodium  Hyperkalemia, likely secondary to ARB. The patient reported that she had been taking and ARB at home, however perhaps she has been noncompliant with her medications. She did not receive any potassium supplementation and she does not appear to have any metabolic acidosis.  EKG with mildly peaked T waves -  Resolved with Calcium gluconate IV once, D 50 and insulin, kayexalate  Diet:  Low sodium Access:  PIV IVF:  off Proph:  lovenox  Code Status: full Family Communication: patient  Disposition Plan: pending improvement potassium transfer to telemetry.  Awaiting improvement in rales and lower extremity  edema   Consultants:  none  Procedures:  CT angio chest on 6/19:  Neg PE  CXR 6/19:  Pulmonary edema and effusions  ECHO  Antibiotics:  none   HPI/Subjective:  History obtained with Somali interpreter from [place]:  Breathing and leg pain have improved.  She feels well today.   Objective: Filed Vitals:   02/15/15 0500 02/15/15 0545 02/15/15 0600 02/15/15 0800  BP:  142/54 156/49 156/42  Pulse:   77 76  Temp:   98.8 F (37.1 C) 98.8 F (37.1 C)  TempSrc:    Core (Comment)  Resp:   17 15  Height:      Weight: 61.1 kg (134 lb 11.2 oz)     SpO2:   97% 94%    Intake/Output Summary (Last 24 hours) at 02/15/15 0902 Last data filed at 02/15/15 0543  Gross per 24 hour  Intake    810 ml  Output   1050 ml  Net   -240 ml   Filed Weights   02/13/15 0553 02/14/15 0341 02/15/15 0500  Weight: 61.1 kg (134 lb 11.2 oz) 61.8 kg (136 lb 3.9 oz) 61.1 kg (134 lb 11.2 oz)    Exam:   General:  Cachectic female, No acute distress, weak cough  HEENT:  NCAT, MMM  Cardiovascular:  RRR, nl S1, S2 no mrg, 2+ pulses, warm extremities  Respiratory:  Course rales to the mid-back with rhonchi, no obvious wheeze   Abdomen:   NABS, soft, mildly distended, nontender  MSK:   Normal tone and bulk, trace bilateral pitting LEE  Neuro:  Grossly intact  Data Reviewed: Basic Metabolic Panel:  Recent Labs Lab 02/13/15 0115 02/13/15 0634 02/14/15 0355 02/14/15 1336 02/15/15 0335  NA 128*  --  129* 131* 132*  K 4.7  --  6.0* 4.8 4.3  CL 99*  --  95* 96* 94*  CO2 22  --  26 27 28   GLUCOSE 122*  --  89 107* 86  BUN 30*  --  34* 34* 39*  CREATININE 1.00 0.95 1.20* 1.32* 1.42*  CALCIUM 9.0  --  8.8* 8.9 8.3*  MG  --  1.9  --   --   --    Liver Function Tests: No results for input(s): AST, ALT, ALKPHOS, BILITOT, PROT, ALBUMIN in the last 168 hours. No results for input(s): LIPASE, AMYLASE in the last 168 hours. No results for input(s): AMMONIA in the last 168  hours. CBC:  Recent Labs Lab 02/13/15 0115 02/13/15 0634 02/14/15 0355 02/15/15 0335  WBC 5.0 4.4 3.4* 4.7  NEUTROABS 3.3  --   --   --   HGB 9.5* 9.1* 8.4* 8.6*  HCT 28.4* 27.0* 25.3* 26.0*  MCV 90.2 89.7 89.1 88.4  PLT 312 317 271 276   Cardiac Enzymes:  Recent Labs Lab 02/13/15 0634 02/13/15 1140  TROPONINI 0.06* 0.06*   BNP (last 3 results)  Recent Labs  02/13/15 0115  BNP 472.1*    ProBNP (last 3 results) No results for input(s): PROBNP in the last 8760 hours.  CBG: No results for input(s): GLUCAP in the last 168 hours.  Recent Results (from the  past 240 hour(s))  MRSA PCR Screening     Status: None   Collection Time: 02/13/15 12:57 AM  Result Value Ref Range Status   MRSA by PCR NEGATIVE NEGATIVE Final    Comment:        The GeneXpert MRSA Assay (FDA approved for NASAL specimens only), is one component of a comprehensive MRSA colonization surveillance program. It is not intended to diagnose MRSA infection nor to guide or monitor treatment for MRSA infections.      Studies: No results found.  Scheduled Meds: . amLODipine  10 mg Oral Daily  . aspirin EC  81 mg Oral Daily  . dextromethorphan-guaiFENesin  1 tablet Oral BID  . enoxaparin (LOVENOX) injection  40 mg Subcutaneous Q24H  . hydrALAZINE  10 mg Oral 3 times per day  . isosorbide mononitrate  15 mg Oral Daily  . levothyroxine  50 mcg Oral QAC breakfast  . metoprolol succinate  50 mg Oral Daily  . pantoprazole  20 mg Oral Daily  . sodium chloride  3 mL Intravenous Q12H   Continuous Infusions:   Principal Problem:   SOB (shortness of breath) Active Problems:   Acute diastolic heart failure   GERD (gastroesophageal reflux disease)   Asthma   Pleuritic chest pain   Pulmonary hypertension   Elevated TSH   Hyperkalemia    Time spent: 30 min    Samer Dutton, [facility] Pager [phone]. If 7PM-7AM, please contact night-coverage at [email or web address], password  [facility] 02/15/2015, 9:02 AM  LOS: 2 days

## 2015-02-16 DIAGNOSIS — R0602 Shortness of breath: Secondary | ICD-10-CM

## 2015-02-16 LAB — BASIC METABOLIC PANEL
ANION GAP: 10 (ref 5–15)
BUN: 39 mg/dL — ABNORMAL HIGH (ref 6–20)
CO2: 27 mmol/L (ref 22–32)
Calcium: 8 mg/dL — ABNORMAL LOW (ref 8.9–10.3)
Chloride: 91 mmol/L — ABNORMAL LOW (ref 101–111)
Creatinine, Ser: 1.37 mg/dL — ABNORMAL HIGH (ref 0.44–1.00)
GFR, EST AFRICAN AMERICAN: 43 mL/min — AB (ref >60)
GFR, EST NON AFRICAN AMERICAN: 37 mL/min — AB (ref >60)
Glucose, Bld: 100 mg/dL — ABNORMAL HIGH (ref 65–99)
Potassium: 4.4 mmol/L (ref 3.5–5.1)
Sodium: 128 mmol/L — ABNORMAL LOW (ref 135–145)

## 2015-02-16 LAB — CBC
HCT: 25.2 % — ABNORMAL LOW (ref 36.0–46.0)
Hemoglobin: 8.7 g/dL — ABNORMAL LOW (ref 12.0–15.0)
MCH: 30.6 pg (ref 26.0–34.0)
MCHC: 34.5 g/dL (ref 30.0–36.0)
MCV: 88.7 fL (ref 78.0–100.0)
PLATELETS: 271 10*3/uL (ref 150–400)
RBC: 2.84 MIL/uL — AB (ref 3.87–5.11)
RDW: 14.5 % (ref 11.5–15.5)
WBC: 4.3 10*3/uL (ref 4.0–10.5)

## 2015-02-16 MED ORDER — SODIUM CHLORIDE 0.9 % IV BOLUS (SEPSIS)
250.0000 mL | Freq: Once | INTRAVENOUS | Status: AC
  Administered 2015-02-16: 250 mL via INTRAVENOUS

## 2015-02-16 MED ORDER — SODIUM CHLORIDE 0.9 % IV SOLN
1.5000 g | Freq: Four times a day (QID) | INTRAVENOUS | Status: DC
  Administered 2015-02-16 – 2015-02-17 (×3): 1.5 g via INTRAVENOUS
  Filled 2015-02-16 (×5): qty 1.5

## 2015-02-16 MED ORDER — FUROSEMIDE 20 MG PO TABS
20.0000 mg | ORAL_TABLET | Freq: Every day | ORAL | Status: DC
  Administered 2015-02-17 – 2015-02-18 (×2): 20 mg via ORAL
  Filled 2015-02-16 (×2): qty 1

## 2015-02-16 NOTE — Progress Notes (Signed)
Patient to be transferred to telemetry.  Report called to Janett Billow, RN on 4th floor that is to receive patient. Patient transferred via wheelchair.

## 2015-02-16 NOTE — Progress Notes (Signed)
ANTIBIOTIC CONSULT NOTE - Follow Up  Pharmacy Consult for Unasyn Indication: aspiration pneumonia  No Known Allergies  Patient Measurements: Height: 5\' 1"  (154.9 cm) Weight: 136 lb 14.4 oz (62.097 kg) IBW/kg (Calculated) : 47.8  Vital Signs: Temp: 98.5 F (36.9 C) (06/22 0515) Temp Source: Oral (06/22 0515) BP: 156/72 mmHg (06/22 0515) Pulse Rate: 69 (06/22 0515) Intake/Output from previous day: 06/21 0701 - 06/22 0700 In: 650 [P.O.:600; IV Piggyback:50] Out: 1325 [id number] Intake/Output from this shift:    Labs:  Recent Labs  02/14/15 0355 02/14/15 1336 02/15/15 0335 02/16/15 0355  WBC 3.4*  --  4.7 4.3  HGB 8.4*  --  8.6* 8.7*  PLT 271  --  276 271  CREATININE 1.20* 1.32* 1.42* 1.37*   Estimated Creatinine Clearance: 30.4 mL/min (by C-G formula based on Cr of 1.37). No results for input(s): VANCOTROUGH, VANCOPEAK, VANCORANDOM, GENTTROUGH, GENTPEAK, GENTRANDOM, TOBRATROUGH, TOBRAPEAK, TOBRARND, AMIKACINPEAK, AMIKACINTROU, AMIKACIN in the last 72 hours.   Microbiology: Recent Results (from the past 720 hour(s))  MRSA PCR Screening     Status: None   Collection Time: 02/13/15 12:57 AM  Result Value Ref Range Status   MRSA by PCR NEGATIVE NEGATIVE Final    Comment:        The GeneXpert MRSA Assay (FDA approved for NASAL specimens only), is one component of a comprehensive MRSA colonization surveillance program. It is not intended to diagnose MRSA infection nor to guide or monitor treatment for MRSA infections.     Medical History: Past Medical History  Diagnosis Date  . HTN (hypertension)   . GERD (gastroesophageal reflux disease)   . Asthma   . Thyroid disease     not know in detail    Medications:  Anti-infectives    Start     Dose/Rate Route Frequency Ordered Stop   02/16/15 1600  ampicillin-sulbactam (UNASYN) 1.5 g in sodium chloride 0.9 % 50 mL IVPB     1.5 g 100 mL/hr over 30 Minutes Intravenous Every 6 hours 02/16/15 1007     02/15/15 1030  ampicillin-sulbactam (UNASYN) 1.5 g in sodium chloride 0.9 % 50 mL IVPB  Status:  Discontinued     1.5 g 100 mL/hr over 30 Minutes Intravenous 2 times daily 02/15/15 0941 02/16/15 1007     Assessment: 74 y.o. female with history of hypertension, GERD, asthma, thyroid disease who admitted 6/19 for CHF exacerbation.  MD thinks may also have aspiration pneumonia given recurrent hypothermia, cough, and rales despite evidence that she has diuresed.  Repeat CXR improving but persistent lower lobe opacities.  Ordered Unasyn per pharmacy.  6/21 >> Unasyn >>  Tmax: afebrile WBCs: WNL Renal: SCr increased following diuresis, some improvement today. SCr 1.37, CrCl~35 ml/min  Blood: IP MRSA screen negative  Goal of Therapy:  Eradication of infection Appropriate antibiotic dosing for indication and renal function  Plan:  Adjust Unasyn to 1.5g IV q6h for CrCl>30 ml/min and aspiration pneumonia indication.  Hershal Coria, PharmD, BCPS Pager: [phone] 02/16/2015 10:10 AM

## 2015-02-16 NOTE — Progress Notes (Signed)
[facility] PROGRESS NOTE  Katie Mosley [id number] DOB: Feb 22, 1941 DOA: 02/13/2015 PCP: No primary care provider on file.  Brief Summary  The patient is a 74 yo female with history of hypertension, GERD, asthma, thyroid disease who presented with a one week history of progressive SOB, pleuritic chest pain, lower extremity pain and edema.  She had increased her lasix but had no relief.  She was found to have a BNP of 471 and CXR demonstrated cardiac enlargement with pulmonary vascular congestion, perihilar edema, and bilateral pleural effusions consistent with acute on chronic heart failure exacerbation.  CT angio chest was negative for PE.   She was diuresed but now her creatinine is rising and she continues to have rales on exam.  Additionally, she has had recurrent hypothermia requiring warming blanket.  I have started treatment for possible aspiration pneumonia to see if that may help with her labile temperature but if not, she may need MRI brain.    Assessment/Plan  Acute hypoxic respiratory failure likely secondary to acute on chronic diastolic CHF exacerbation and possible hypothyroidism.  May also have aspiration pneumonia given recurrent hypothermia and rales despite evidence that she has diuresed.  She also has  - Most likely due to acute on chronic diastolic congestive heart failure - Will decrease Lasix dose as patient had hypotension  Hypotension - In context of patient on Lasix and antihypertensives. - We'll fluid bolus 250 mL 1 and discontinue amlodipine. -Decrease oral Lasix regimen  Pulmonary hypertension may be due to diastolic heart failure -  Decrease diuretic dose  Hypothermia may be related to hypothyroidism.  Repeat CXR to eval for aspiration pneumonia:  Improving but persistent lower lobe opacities.  I will start empiric unasyn.  -  Work up for hypothyroidism as below, started synthroid -  Cortisol level wnl -  Had second dip in temperature on 6/20 -   Blood cultures pending -  If she has recurrent hypothermia, check MRI brain.  Pleuritic chest pain likely related to pulmonary edema.  CT angio negative for PE. - no complaints of chest pain reported  GERD:  -Protonix, stable  Asthma: No wheezing or rales on auscultation. No acute exacerbation. -When necessary DuoNeb  - Mucinex when necessary if cough  Normocytic anemia, likely due to chronic disease, hemoglobin drifting down -  Iron studies, B12, folate within normal limits -  TSH elevated -  Occult stool pending -  Stable -  HIV none reactive  Elevated TSH, possible hypothyroidism vs. Sick euthyroid -  fT4 lower end of normal -  Start levothyroxine 50 mcg daily   Hyponatremia due to heart failure and possible hypothyroidism, improving somewhat with diuresis -  Diuresis and further evaluation for hypothyroidism as above -  Trend sodium, with mild decrease on last check  Hyperkalemia, likely secondary to ARB.  - Resolved  Diet:  Low sodium Access:  PIV IVF:  off Proph:  lovenox  Code Status: full Family Communication: patient  Disposition Plan: Transfer to telemetry floor given hypotension   Consultants:  none  Procedures:  CT angio chest on 6/19:  Neg PE  CXR 6/19:  Pulmonary edema and effusions  ECHO  Antibiotics:  Unasyn  HPI/Subjective:  History obtained from family member at bedside. No new complaints from patient today. Is currently breathing at her baseline Objective: Filed Vitals:   02/16/15 0622 02/16/15 1430 02/16/15 1437 02/16/15 1448  BP:  95/33 90/35 92/44   Pulse:  74    Temp:  98.5 F (36.9 C)  TempSrc:  Oral    Resp:  18    Height:      Weight: 62.097 kg (136 lb 14.4 oz)     SpO2:  93%      Intake/Output Summary (Last 24 hours) at 02/16/15 1529 Last data filed at 02/16/15 1416  Gross per 24 hour  Intake    539 ml  Output   1100 ml  Net   -561 ml   Filed Weights   02/15/15 0500 02/15/15 2004 02/16/15 0622  Weight:  61.1 kg (134 lb 11.2 oz) 62.143 kg (137 lb) 62.097 kg (136 lb 14.4 oz)    Exam:   General:  Cachectic female, in no acute distress, alert and awake  HEENT:  NCAT, MMM  Cardiovascular:  RRR, nl S1, S2 no mrg, 2+ pulses, warm extremities  Respiratory:  Equal chest rise, no increased work of breathing, no audible wheezes  Abdomen:   soft, mildly distended, nontender  MSK:   Normal tone and bulk, trace bilateral pitting LEE  Neuro:  Grossly intact  Data Reviewed: Basic Metabolic Panel:  Recent Labs Lab 02/13/15 0115 02/13/15 0634 02/14/15 0355 02/14/15 1336 02/15/15 0335 02/16/15 0355  NA 128*  --  129* 131* 132* 128*  K 4.7  --  6.0* 4.8 4.3 4.4  CL 99*  --  95* 96* 94* 91*  CO2 22  --  26 27 28 27   GLUCOSE 122*  --  89 107* 86 100*  BUN 30*  --  34* 34* 39* 39*  CREATININE 1.00 0.95 1.20* 1.32* 1.42* 1.37*  CALCIUM 9.0  --  8.8* 8.9 8.3* 8.0*  MG  --  1.9  --   --   --   --    Liver Function Tests: No results for input(s): AST, ALT, ALKPHOS, BILITOT, PROT, ALBUMIN in the last 168 hours. No results for input(s): LIPASE, AMYLASE in the last 168 hours. No results for input(s): AMMONIA in the last 168 hours. CBC:  Recent Labs Lab 02/13/15 0115 02/13/15 0634 02/14/15 0355 02/15/15 0335 02/16/15 0355  WBC 5.0 4.4 3.4* 4.7 4.3  NEUTROABS 3.3  --   --   --   --   HGB 9.5* 9.1* 8.4* 8.6* 8.7*  HCT 28.4* 27.0* 25.3* 26.0* 25.2*  MCV 90.2 89.7 89.1 88.4 88.7  PLT 312 317 271 276 271   Cardiac Enzymes:  Recent Labs Lab 02/13/15 0634 02/13/15 1140  TROPONINI 0.06* 0.06*   BNP (last 3 results)  Recent Labs  02/13/15 0115  BNP 472.1*    ProBNP (last 3 results) No results for input(s): PROBNP in the last 8760 hours.  CBG: No results for input(s): GLUCAP in the last 168 hours.  Recent Results (from the past 240 hour(s))  MRSA PCR Screening     Status: None   Collection Time: 02/13/15 12:57 AM  Result Value Ref Range Status   MRSA by PCR NEGATIVE  NEGATIVE Final    Comment:        The GeneXpert MRSA Assay (FDA approved for NASAL specimens only), is one component of a comprehensive MRSA colonization surveillance program. It is not intended to diagnose MRSA infection nor to guide or monitor treatment for MRSA infections.   Culture, blood (routine x 2)     Status: None (Preliminary result)   Collection Time: 02/15/15  9:55 AM  Result Value Ref Range Status   Specimen Description BLOOD RIGHT HAND  Final   Special Requests BOTTLES DRAWN AEROBIC AND ANAEROBIC 5CC  Final   Culture   Final    NO GROWTH 1 DAY Performed at [facility]    Report Status PENDING  Incomplete  Culture, blood (routine x 2)     Status: None (Preliminary result)   Collection Time: 02/15/15  9:57 AM  Result Value Ref Range Status   Specimen Description BLOOD LEFT HAND  Final   Special Requests BOTTLES DRAWN AEROBIC AND ANAEROBIC 5CC  Final   Culture   Final    NO GROWTH 1 DAY Performed at [facility]    Report Status PENDING  Incomplete     Studies: Dg Chest Port 1 View  02/15/2015   CLINICAL DATA:  Shortness of breath. Possible aspiration pneumonia. CHF. Bibasilar crackles.  EXAM: PORTABLE CHEST - 1 VIEW  COMPARISON:  02/13/2015  FINDINGS: Heart is mildly enlarged. Vascular congestion. Diffuse interstitial prominence again noted which could reflect interstitial edema. Mild bibasilar opacities have slightly improved. Small effusions, slightly improved.  IMPRESSION: Continued interstitial edema/ CHF pattern.  Improving bibasilar opacities and small effusions.   Electronically Signed   By: Rolm Baptise M.D.   On: 02/15/2015 09:59    Scheduled Meds: . ampicillin-sulbactam (UNASYN) IV  1.5 g Intravenous Q6H  . aspirin EC  81 mg Oral Daily  . dextromethorphan-guaiFENesin  1 tablet Oral BID  . enoxaparin (LOVENOX) injection  40 mg Subcutaneous Q24H  . [START ON 02/17/2015] furosemide  20 mg Oral Daily  . hydrALAZINE  10 mg Oral 3 times per  day  . isosorbide mononitrate  15 mg Oral Daily  . levothyroxine  50 mcg Oral QAC breakfast  . metoprolol succinate  50 mg Oral Daily  . pantoprazole  20 mg Oral Daily  . sodium chloride  3 mL Intravenous Q12H   Continuous Infusions:   Principal Problem:   SOB (shortness of breath) Active Problems:   Acute diastolic heart failure   GERD (gastroesophageal reflux disease)   Asthma   Pleuritic chest pain   Pulmonary hypertension   Elevated TSH   Hyperkalemia   Hypothermia    Time spent: 30 min    Velvet Bathe  [facility] Pager [phone] If 7PM-7AM, please contact night-coverage at [email or web address], password [facility] 02/16/2015, 3:29 PM  LOS: 3 days

## 2015-02-16 NOTE — Progress Notes (Signed)
Pt arrived to unit. Telemetry monitor placed. Vitals obtained. BP 100/43, HR 73. Pt voided on arrival to the unit as well. She had been due to void since foley catheter removal. Pt stable and with no complaints at this time. Will continue to monitor.  Katie Mosley [facility] 02/16/2015 5:38 PM

## 2015-02-17 LAB — BASIC METABOLIC PANEL
Anion gap: 10 (ref 5–15)
BUN: 44 mg/dL — AB (ref 6–20)
CALCIUM: 7.6 mg/dL — AB (ref 8.9–10.3)
CO2: 25 mmol/L (ref 22–32)
CREATININE: 1.68 mg/dL — AB (ref 0.44–1.00)
Chloride: 94 mmol/L — ABNORMAL LOW (ref 101–111)
GFR calc Af Amer: 33 mL/min — ABNORMAL LOW (ref >60)
GFR, EST NON AFRICAN AMERICAN: 29 mL/min — AB (ref >60)
Glucose, Bld: 95 mg/dL (ref 65–99)
Potassium: 4.9 mmol/L (ref 3.5–5.1)
Sodium: 129 mmol/L — ABNORMAL LOW (ref 135–145)

## 2015-02-17 LAB — CBC
HCT: 24.1 % — ABNORMAL LOW (ref 36.0–46.0)
Hemoglobin: 8.2 g/dL — ABNORMAL LOW (ref 12.0–15.0)
MCH: 30.3 pg (ref 26.0–34.0)
MCHC: 34 g/dL (ref 30.0–36.0)
MCV: 88.9 fL (ref 78.0–100.0)
Platelets: 266 10*3/uL (ref 150–400)
RBC: 2.71 MIL/uL — ABNORMAL LOW (ref 3.87–5.11)
RDW: 14.5 % (ref 11.5–15.5)
WBC: 4.4 10*3/uL (ref 4.0–10.5)

## 2015-02-17 MED ORDER — AMOXICILLIN-POT CLAVULANATE 875-125 MG PO TABS
1.0000 | ORAL_TABLET | Freq: Two times a day (BID) | ORAL | Status: DC
  Administered 2015-02-17: 1 via ORAL
  Filled 2015-02-17: qty 1

## 2015-02-17 MED ORDER — AMOXICILLIN-POT CLAVULANATE 500-125 MG PO TABS
1.0000 | ORAL_TABLET | Freq: Two times a day (BID) | ORAL | Status: DC
  Administered 2015-02-17 – 2015-02-18 (×2): 500 mg via ORAL
  Filled 2015-02-17 (×3): qty 1

## 2015-02-17 MED ORDER — ENOXAPARIN SODIUM 30 MG/0.3ML ~~LOC~~ SOLN
30.0000 mg | SUBCUTANEOUS | Status: DC
  Administered 2015-02-17: 30 mg via SUBCUTANEOUS
  Filled 2015-02-17: qty 0.3

## 2015-02-17 NOTE — Progress Notes (Signed)
[facility] PROGRESS NOTE  Katie Mosley [id number] DOB: 07/13/1941 DOA: 02/13/2015 PCP: No primary care provider on file.  Brief Summary  The patient is a 74 yo female with history of hypertension, GERD, asthma, thyroid disease who presented with a one week history of progressive SOB, pleuritic chest pain, lower extremity pain and edema.  She had increased her lasix but had no relief.  She was found to have a BNP of 471 and CXR demonstrated cardiac enlargement with pulmonary vascular congestion, perihilar edema, and bilateral pleural effusions consistent with acute on chronic heart failure exacerbation.  CT angio chest was negative for PE.   She was diuresed but now her creatinine is rising and she continues to have rales on exam.  Additionally, she has had recurrent hypothermia requiring warming blanket.  I have started treatment for possible aspiration pneumonia to see if that may help with her labile temperature but if not, she may need MRI brain.    Assessment/Plan  Acute hypoxic respiratory failure likely secondary to acute on chronic diastolic CHF exacerbation and possible hypothyroidism.  May also have aspiration pneumonia given recurrent hypothermia and rales despite evidence that she has diuresed.  She also has  - Most likely due to acute on chronic diastolic congestive heart failure - Lasix dose recently decreased due to soft blood pressures  Hypotension - In context of patient on Lasix and antihypertensives. - Discontinued amlodipine. Blood pressures improving. - Decrease oral Lasix regimen  Pulmonary hypertension may be due to diastolic heart failure -  Decrease diuretic dose  Hypothermia may be related to hypothyroidism.  Repeat CXR to eval for aspiration pneumonia:  Improving but persistent lower lobe opacities.  I will start empiric unasyn.  -  Work up for hypothyroidism as below, started synthroid -  Cortisol level wnl -  Had second dip in temperature on 6/20 -   Blood cultures pending  Pleuritic chest pain likely related to pulmonary edema.  CT angio negative for PE. - no complaints of chest pain reported  GERD:  -Protonix, stable  Asthma: No wheezing or rales on auscultation. No acute exacerbation. -When necessary DuoNeb  - Mucinex when necessary if cough  Normocytic anemia, likely due to chronic disease, hemoglobin drifting down -  Iron studies, B12, folate within normal limits -  TSH elevated -  Occult stool pending -  Stable -  HIV none reactive  Elevated TSH, possible hypothyroidism vs. Sick euthyroid -  fT4 lower end of normal -  Start levothyroxine 50 mcg daily   Hyponatremia due to heart failure and possible hypothyroidism, improving somewhat with diuresis -  Diuresis and further evaluation for hypothyroidism as above -  Trend sodium, with mild decrease on last check  Hyperkalemia, likely secondary to ARB.  - Resolved  Diet:  Low sodium Access:  PIV IVF:  off Proph:  lovenox  Code Status: full Family Communication: patient  Disposition Plan: Transfer to telemetry floor given hypotension   Consultants:  none  Procedures:  CT angio chest on 6/19:  Neg PE  CXR 6/19:  Pulmonary edema and effusions  ECHO  Antibiotics:  Unasyn  HPI/Subjective: No new complaitns reported by patient and family Objective: Filed Vitals:   02/16/15 2130 02/17/15 0418 02/17/15 1331 02/17/15 1531  BP: 117/53 126/86 102/33 117/43  Pulse: 71 69  71  Temp: 99.3 F (37.4 C) 99.1 F (37.3 C) 99.1 F (37.3 C)   TempSrc: Oral Oral Oral   Resp: 18 16 18    Height:  Weight:  62.778 kg (138 lb 6.4 oz)    SpO2: 98% 99% 97%     Intake/Output Summary (Last 24 hours) at 02/17/15 1614 Last data filed at 02/17/15 1525  Gross per 24 hour  Intake    793 ml  Output    150 ml  Net    643 ml   Filed Weights   02/15/15 2004 02/16/15 0622 02/17/15 0418  Weight: 62.143 kg (137 lb) 62.097 kg (136 lb 14.4 oz) 62.778 kg (138 lb 6.4 oz)     Exam:   General:  Cachectic female, in no acute distress, alert and awake  HEENT:  NCAT, MMM  Cardiovascular:  RRR, nl S1, S2 no mrg, 2+ pulses, warm extremities  Respiratory:  Equal chest rise, no increased work of breathing, no audible wheezes  Abdomen:   soft, mildly distended, nontender  MSK:   Normal tone and bulk, trace bilateral pitting LEE  Neuro:  Grossly intact  Data Reviewed: Basic Metabolic Panel:  Recent Labs Lab 02/13/15 0634 02/14/15 0355 02/14/15 1336 02/15/15 0335 02/16/15 0355 02/17/15 0516  NA  --  129* 131* 132* 128* 129*  K  --  6.0* 4.8 4.3 4.4 4.9  CL  --  95* 96* 94* 91* 94*  CO2  --  26 27 28 27 25   GLUCOSE  --  89 107* 86 100* 95  BUN  --  34* 34* 39* 39* 44*  CREATININE 0.95 1.20* 1.32* 1.42* 1.37* 1.68*  CALCIUM  --  8.8* 8.9 8.3* 8.0* 7.6*  MG 1.9  --   --   --   --   --    Liver Function Tests: No results for input(s): AST, ALT, ALKPHOS, BILITOT, PROT, ALBUMIN in the last 168 hours. No results for input(s): LIPASE, AMYLASE in the last 168 hours. No results for input(s): AMMONIA in the last 168 hours. CBC:  Recent Labs Lab 02/13/15 0115 02/13/15 [id number] 02/14/15 0355 02/15/15 0335 02/16/15 0355 02/17/15 0516  WBC 5.0 4.4 3.4* 4.7 4.3 4.4  NEUTROABS 3.3  --   --   --   --   --   HGB 9.5* 9.1* 8.4* 8.6* 8.7* 8.2*  HCT 28.4* 27.0* 25.3* 26.0* 25.2* 24.1*  MCV 90.2 89.7 89.1 88.4 88.7 88.9  PLT 312 317 271 276 271 266   Cardiac Enzymes:  Recent Labs Lab 02/13/15 0634 02/13/15 1140  TROPONINI 0.06* 0.06*   BNP (last 3 results)  Recent Labs  02/13/15 0115  BNP 472.1*    ProBNP (last 3 results) No results for input(s): PROBNP in the last 8760 hours.  CBG: No results for input(s): GLUCAP in the last 168 hours.  Recent Results (from the past 240 hour(s))  MRSA PCR Screening     Status: None   Collection Time: 02/13/15 12:57 AM  Result Value Ref Range Status   MRSA by PCR NEGATIVE NEGATIVE Final    Comment:         The GeneXpert MRSA Assay (FDA approved for NASAL specimens only), is one component of a comprehensive MRSA colonization surveillance program. It is not intended to diagnose MRSA infection nor to guide or monitor treatment for MRSA infections.   Culture, blood (routine x 2)     Status: None (Preliminary result)   Collection Time: 02/15/15  9:55 AM  Result Value Ref Range Status   Specimen Description BLOOD RIGHT HAND  Final   Special Requests BOTTLES DRAWN AEROBIC AND ANAEROBIC 5CC  Final   Culture  Final    NO GROWTH 2 DAYS Performed at [facility]    Report Status PENDING  Incomplete  Culture, blood (routine x 2)     Status: None (Preliminary result)   Collection Time: 02/15/15  9:57 AM  Result Value Ref Range Status   Specimen Description BLOOD LEFT HAND  Final   Special Requests BOTTLES DRAWN AEROBIC AND ANAEROBIC 5CC  Final   Culture   Final    NO GROWTH 2 DAYS Performed at [facility]    Report Status PENDING  Incomplete     Studies: No results found.  Scheduled Meds: . amoxicillin-clavulanate  1 tablet Oral BID  . aspirin EC  81 mg Oral Daily  . dextromethorphan-guaiFENesin  1 tablet Oral BID  . enoxaparin (LOVENOX) injection  30 mg Subcutaneous Q24H  . furosemide  20 mg Oral Daily  . hydrALAZINE  10 mg Oral 3 times per day  . isosorbide mononitrate  15 mg Oral Daily  . levothyroxine  50 mcg Oral QAC breakfast  . metoprolol succinate  50 mg Oral Daily  . pantoprazole  20 mg Oral Daily  . sodium chloride  3 mL Intravenous Q12H   Continuous Infusions:   Principal Problem:   SOB (shortness of breath) Active Problems:   Acute diastolic heart failure   GERD (gastroesophageal reflux disease)   Asthma   Pleuritic chest pain   Pulmonary hypertension   Elevated TSH   Hyperkalemia   Hypothermia    Time spent: 30 min    Velvet Bathe  [facility] Pager [phone] If 7PM-7AM, please contact night-coverage at [email or web address],  password [facility] 02/17/2015, 4:14 PM  LOS: 4 days

## 2015-02-17 NOTE — Progress Notes (Signed)
Physical Therapy Treatment Patient Details Name: Katie Mosley MRN: [id number] DOB: [birth year] Today's Date: 02/17/2015    History of Present Illness Pt admitted with acute hypoxic resp failure likely secondary to acute on chronic CHF exacerbation    PT Comments    Progressing with mobility.   Follow Up Recommendations  Supervision for mobility/OOB;No PT follow up     Equipment Recommendations  None recommended by PT    Recommendations for Other Services OT consult     Precautions / Restrictions Precautions Precautions: Fall Restrictions Weight Bearing Restrictions: No    Mobility  Bed Mobility Overal bed mobility: Independent                Transfers Overall transfer level: Modified independent                  Ambulation/Gait Ambulation/Gait assistance: Min guard;Min assist Ambulation Distance (Feet): 250 Feet Assistive device: 1 person hand held assist Gait Pattern/deviations: Trunk flexed;Step-through pattern;Decreased stride length     General Gait Details: Intermittent assist needed for stability.    Stairs            Wheelchair Mobility    Modified Rankin (Stroke Patients Only)       Balance Overall balance assessment: Needs assistance         Standing balance support: No upper extremity supported;During functional activity Standing balance-Leahy Scale: Good                      Cognition Arousal/Alertness: Awake/alert Behavior During Therapy: WFL for tasks assessed/performed Overall Cognitive Status: Difficult to assess                      Exercises      General Comments        Pertinent Vitals/Pain Pain Assessment: No/denies pain    Home Living                      Prior Function            PT Goals (current goals can now be found in the care plan section) Progress towards PT goals: Progressing toward goals    Frequency  Min 3X/week    PT Plan Current plan remains  appropriate    Co-evaluation             End of Session Equipment Utilized During Treatment: Gait belt Activity Tolerance: Patient tolerated treatment well Patient left: in bed;with call bell/phone within reach;with family/visitor present     Time: [id number] PT Time Calculation (min) (ACUTE ONLY): 14 min  Charges:  $Gait Training: 8-22 mins                    G Codes:      Weston Anna, MPT Pager: [phone]

## 2015-02-18 ENCOUNTER — Encounter (HOSPITAL_COMMUNITY): Payer: Self-pay

## 2015-02-18 LAB — CBC
HEMATOCRIT: 25.5 % — AB (ref 36.0–46.0)
Hemoglobin: 8.6 g/dL — ABNORMAL LOW (ref 12.0–15.0)
MCH: 30.2 pg (ref 26.0–34.0)
MCHC: 33.7 g/dL (ref 30.0–36.0)
MCV: 89.5 fL (ref 78.0–100.0)
Platelets: 301 10*3/uL (ref 150–400)
RBC: 2.85 MIL/uL — ABNORMAL LOW (ref 3.87–5.11)
RDW: 14.5 % (ref 11.5–15.5)
WBC: 3.5 10*3/uL — ABNORMAL LOW (ref 4.0–10.5)

## 2015-02-18 MED ORDER — LEVOTHYROXINE SODIUM 50 MCG PO TABS: 50.0000 ug | ORAL_TABLET | Freq: Every day | ORAL | Status: DC

## 2015-02-18 MED ORDER — ISOSORBIDE MONONITRATE ER 30 MG PO TB24: 15.0000 mg | ORAL_TABLET | Freq: Every day | ORAL | Status: DC

## 2015-02-18 MED ORDER — AMOXICILLIN-POT CLAVULANATE 500-125 MG PO TABS: 1.0000 | ORAL_TABLET | Freq: Two times a day (BID) | ORAL | Status: DC

## 2015-02-18 MED ORDER — FUROSEMIDE 20 MG PO TABS: 20.0000 mg | ORAL_TABLET | Freq: Every day | ORAL | Status: DC

## 2015-02-18 MED ORDER — ASPIRIN 81 MG PO TBEC: 81.0000 mg | DELAYED_RELEASE_TABLET | Freq: Every day | ORAL | Status: DC

## 2015-02-18 NOTE — Discharge Summary (Signed)
Physician Discharge Summary  Katie Mosley UX:[id number] DOB: 10/18/1940 DOA: 02/13/2015  PCP: No primary care provider on file.  Admit date: 02/13/2015 Discharge date: 02/18/2015  Time spent: > 35 minutes  Recommendations for Outpatient Follow-up:  1. Please monitor hemoglobin levels 2. Monitor sodium levels 3. Monitor serum creatinine levels  Discharge Diagnoses:  Principal Problem:   SOB (shortness of breath) Active Problems:   Acute diastolic heart failure   GERD (gastroesophageal reflux disease)   Asthma   Pleuritic chest pain   Pulmonary hypertension   Elevated TSH   Hyperkalemia   Hypothermia   Discharge Condition: stable  Diet recommendation: Heart healthy diet  Filed Weights   02/16/15 0622 02/17/15 0418 02/18/15 0345  Weight: 62.097 kg (136 lb 14.4 oz) 62.778 kg (138 lb 6.4 oz) 61.916 kg (136 lb 8 oz)    History of present illness:  74 yo female with history of hypertension, GERD, asthma, thyroid disease who presented with a one week history of progressive SOB, pleuritic chest pain, lower extremity pain and edema. She had increased her lasix but had no relief. She was found to have a BNP of 471 and CXR demonstrated cardiac enlargement with pulmonary vascular congestion, perihilar edema, and bilateral pleural effusions consistent with acute on chronic heart failure exacerbation.  Hospital Course:  Acute hypoxic respiratory failure likely secondary to acute on chronic diastolic CHF exacerbation and possible hypothyroidism. May also have aspiration pneumonia given recurrent hypothermia and rales despite evidence that she has diuresed. - Most likely due to acute on chronic diastolic congestive heart failure resolved - Lasix dose back to 20 mg po daily.   Hypotension - In context of patient on Lasix and antihypertensives. - Discontinued amlodipine. Blood pressures back to normal limits. - Decrease oral Lasix regimen  Pulmonary hypertension may be due to  diastolic heart failure - Decrease diuretic dose - stable  Hypothermia may be related to hypothyroidism. Repeat CXR to eval for aspiration pneumonia: Improving but persistent lower lobe opacities. I will start empiric unasyn.  - Work up for hypothyroidism as below, started synthroid - Cortisol level wnl - Had second dip in temperature on 6/20 - Blood cultures pending  Pleuritic chest pain likely related to pulmonary edema. CT angio negative for PE. - no complaints of chest pain reported  GERD:  -Protonix, stable  Asthma: No wheezing or rales on auscultation. No acute exacerbation. -When necessary DuoNeb  - Mucinex when necessary if cough  Normocytic anemia, likely due to chronic disease, hemoglobin drifting down - Iron studies, B12, folate within normal limits - TSH elevated - Occult stool pending - Stable - HIV none reactive  Elevated TSH, possible hypothyroidism vs. Sick euthyroid - fT4 lower end of normal - Start levothyroxine 50 mcg daily   Hyponatremia due to heart failure and possible hypothyroidism, improving somewhat with diuresis - Diuresis and further evaluation for hypothyroidism as above - Trend sodium, with mild decrease on last check  Hyperkalemia, likely secondary to ARB.  - Resolved  Procedures:  None  Consultations:  None  Discharge Exam: Filed Vitals:   02/18/15 0345  BP: 142/64  Pulse: 68  Temp: 98.5 F (36.9 C)  Resp: 20    General: Pt in nad, alert and awake Cardiovascular: no cyanosis Respiratory: no wheezes, no increased wob  Discharge Instructions   Discharge Instructions    (HEART FAILURE PATIENTS) Call MD:  Anytime you have any of the following symptoms: 1) 3 pound weight gain in 24 hours or 5 pounds in 1  week 2) shortness of breath, with or without a dry hacking cough 3) swelling in the hands, feet or stomach 4) if you have to sleep on extra pillows at night in order to breathe.    Complete by:  As  directed      Call MD for:  difficulty breathing, headache or visual disturbances    Complete by:  As directed      Call MD for:  severe uncontrolled pain    Complete by:  As directed      Call MD for:  temperature >100.4    Complete by:  As directed      Diet - low sodium heart healthy    Complete by:  As directed      Increase activity slowly    Complete by:  As directed           Current Discharge Medication List    START taking these medications   Details  amoxicillin-clavulanate (AUGMENTIN) 500-125 MG per tablet Take 1 tablet (500 mg total) by mouth 2 (two) times daily. Qty: 6 tablet, Refills: 0    aspirin EC 81 MG EC tablet Take 1 tablet (81 mg total) by mouth daily. Qty: 30 tablet, Refills: 0    isosorbide mononitrate (IMDUR) 30 MG 24 hr tablet Take 0.5 tablets (15 mg total) by mouth daily. Qty: 30 tablet, Refills: 0    levothyroxine (SYNTHROID, LEVOTHROID) 50 MCG tablet Take 1 tablet (50 mcg total) by mouth daily before breakfast. Qty: 30 tablet, Refills: 0      CONTINUE these medications which have CHANGED   Details  furosemide (LASIX) 20 MG tablet Take 1 tablet (20 mg total) by mouth daily. Foot swelling Qty: 30 tablet, Refills: 2      CONTINUE these medications which have NOT CHANGED   Details  lansoprazole (PREVACID) 15 MG capsule Take 15 mg by mouth daily at 12 noon.    metoprolol succinate (TOPROL-XL) 50 MG 24 hr tablet Take 50 mg by mouth daily. Refills: 2    Pramoxine-Dimethicone (GOLD BOND INTENSIVE HEALING EX) Apply 1 application topically as needed (itchy skin).    PROAIR HFA 108 (90 BASE) MCG/ACT inhaler Inhale 2 puffs into the lungs 4 (four) times daily as needed. Shortness of breath Refills: 5      STOP taking these medications     Azilsartan Medoxomil (EDARBI) 40 MG TABS      amLODipine (NORVASC) 5 MG tablet        No Known Allergies    The results of significant diagnostics from this hospitalization (including imaging,  microbiology, ancillary and laboratory) are listed below for reference.    Significant Diagnostic Studies: Dg Chest 2 View  02/13/2015   CLINICAL DATA:  Shortness of breath since yesterday.  EXAM: CHEST  2 VIEW  COMPARISON:  None.  FINDINGS: Cardiac enlargement with moderate pulmonary vascular congestion. Perihilar airspace and interstitial disease consistent with edema. Bilateral pleural effusions. No pneumothorax. Degenerative changes in the spine.  IMPRESSION: Cardiac enlargement with pulmonary vascular congestion, perihilar edema, and bilateral pleural effusions.   Electronically Signed   By: Lucienne Capers M.D.   On: 02/13/2015 01:39   Ct Angio Chest Pe W/cm &/or Wo Cm  02/13/2015   CLINICAL DATA:  Shortness of breath for 5 days with lower extremity swelling. White cell count 5. D-dimer results are not available.  EXAM: CT ANGIOGRAPHY CHEST WITH CONTRAST  TECHNIQUE: Multidetector CT imaging of the chest was performed using the standard protocol during  bolus administration of intravenous contrast. Multiplanar CT image reconstructions and MIPs were obtained to evaluate the vascular anatomy.  CONTRAST:  162mL OMNIPAQUE IOHEXOL 350 MG/ML SOLN  COMPARISON:  Chest 02/13/2015  FINDINGS: Technically adequate study with good opacification of the central and segmental pulmonary arteries. No focal filling defects. No evidence of significant pulmonary embolus.  Cardiac enlargement. Normal caliber thoracic aorta. No significant lymphadenopathy in the chest. Esophagus is mostly decompressed.  Bilateral pleural effusions. Atelectasis in the lung bases. Mild diffuse interstitial infiltration consistent with edema. Changes likely represent congestive heart failure. Fluid in the fissures. No pneumothorax. Airways appear patent.  Included portions of the upper abdominal organs are grossly unremarkable. Degenerative changes in the spine. No destructive bone lesions.  Review of the MIP images confirms the above findings.   IMPRESSION: No evidence of significant pulmonary embolus. Cardiac enlargement with pulmonary vascular congestion interstitial edema. Small bilateral pleural effusions. Findings likely represent congestive heart failure.   Electronically Signed   By: Lucienne Capers M.D.   On: 02/13/2015 05:44   Dg Chest Port 1 View  02/15/2015   CLINICAL DATA:  Shortness of breath. Possible aspiration pneumonia. CHF. Bibasilar crackles.  EXAM: PORTABLE CHEST - 1 VIEW  COMPARISON:  02/13/2015  FINDINGS: Heart is mildly enlarged. Vascular congestion. Diffuse interstitial prominence again noted which could reflect interstitial edema. Mild bibasilar opacities have slightly improved. Small effusions, slightly improved.  IMPRESSION: Continued interstitial edema/ CHF pattern.  Improving bibasilar opacities and small effusions.   Electronically Signed   By: Rolm Baptise M.D.   On: 02/15/2015 09:59    Microbiology: Recent Results (from the past 240 hour(s))  MRSA PCR Screening     Status: None   Collection Time: 02/13/15 12:57 AM  Result Value Ref Range Status   MRSA by PCR NEGATIVE NEGATIVE Final    Comment:        The GeneXpert MRSA Assay (FDA approved for NASAL specimens only), is one component of a comprehensive MRSA colonization surveillance program. It is not intended to diagnose MRSA infection nor to guide or monitor treatment for MRSA infections.   Culture, blood (routine x 2)     Status: None (Preliminary result)   Collection Time: 02/15/15  9:55 AM  Result Value Ref Range Status   Specimen Description BLOOD RIGHT HAND  Final   Special Requests BOTTLES DRAWN AEROBIC AND ANAEROBIC 5CC  Final   Culture   Final    NO GROWTH 2 DAYS Performed at [facility]    Report Status PENDING  Incomplete  Culture, blood (routine x 2)     Status: None (Preliminary result)   Collection Time: 02/15/15  9:57 AM  Result Value Ref Range Status   Specimen Description BLOOD LEFT HAND  Final   Special Requests  BOTTLES DRAWN AEROBIC AND ANAEROBIC 5CC  Final   Culture   Final    NO GROWTH 2 DAYS Performed at [facility]    Report Status PENDING  Incomplete     Labs: Basic Metabolic Panel:  Recent Labs Lab 02/13/15 0634 02/14/15 0355 02/14/15 1336 02/15/15 0335 02/16/15 0355 02/17/15 0516  NA  --  129* 131* 132* 128* 129*  K  --  6.0* 4.8 4.3 4.4 4.9  CL  --  95* 96* 94* 91* 94*  CO2  --  26 27 28 27 25   GLUCOSE  --  89 107* 86 100* 95  BUN  --  34* 34* 39* 39* 44*  CREATININE 0.95 1.20*  1.32* 1.42* 1.37* 1.68*  CALCIUM  --  8.8* 8.9 8.3* 8.0* 7.6*  MG 1.9  --   --   --   --   --    Liver Function Tests: No results for input(s): AST, ALT, ALKPHOS, BILITOT, PROT, ALBUMIN in the last 168 hours. No results for input(s): LIPASE, AMYLASE in the last 168 hours. No results for input(s): AMMONIA in the last 168 hours. CBC:  Recent Labs Lab 02/13/15 0115  02/14/15 0355 02/15/15 0335 02/16/15 0355 02/17/15 0516 02/18/15 0510  WBC 5.0  < > 3.4* 4.7 4.3 4.4 3.5*  NEUTROABS 3.3  --   --   --   --   --   --   HGB 9.5*  < > 8.4* 8.6* 8.7* 8.2* 8.6*  HCT 28.4*  < > 25.3* 26.0* 25.2* 24.1* 25.5*  MCV 90.2  < > 89.1 88.4 88.7 88.9 89.5  PLT 312  < > 271 276 271 266 301  < > = values in this interval not displayed. Cardiac Enzymes:  Recent Labs Lab 02/13/15 0634 02/13/15 1140  TROPONINI 0.06* 0.06*   BNP: BNP (last 3 results)  Recent Labs  02/13/15 0115  BNP 472.1*    ProBNP (last 3 results) No results for input(s): PROBNP in the last 8760 hours.  CBG: No results for input(s): GLUCAP in the last 168 hours.   Signed:  Velvet Bathe  [facility] 02/18/2015, 11:38 AM

## 2015-02-20 LAB — CULTURE, BLOOD (ROUTINE X 2)
CULTURE: NO GROWTH
Culture: NO GROWTH

## 2015-04-28 IMAGING — CR DG CHEST 2V
2 series · 2 of 2 positions shown · non-contrast
Comparison: 08/04/2014 and abdominal CT 10/31/2014

CLINICAL DATA: Productive cough and right lower quadrant pain for
several days.

EXAM:
CHEST  2 VIEW

[x chest ap]
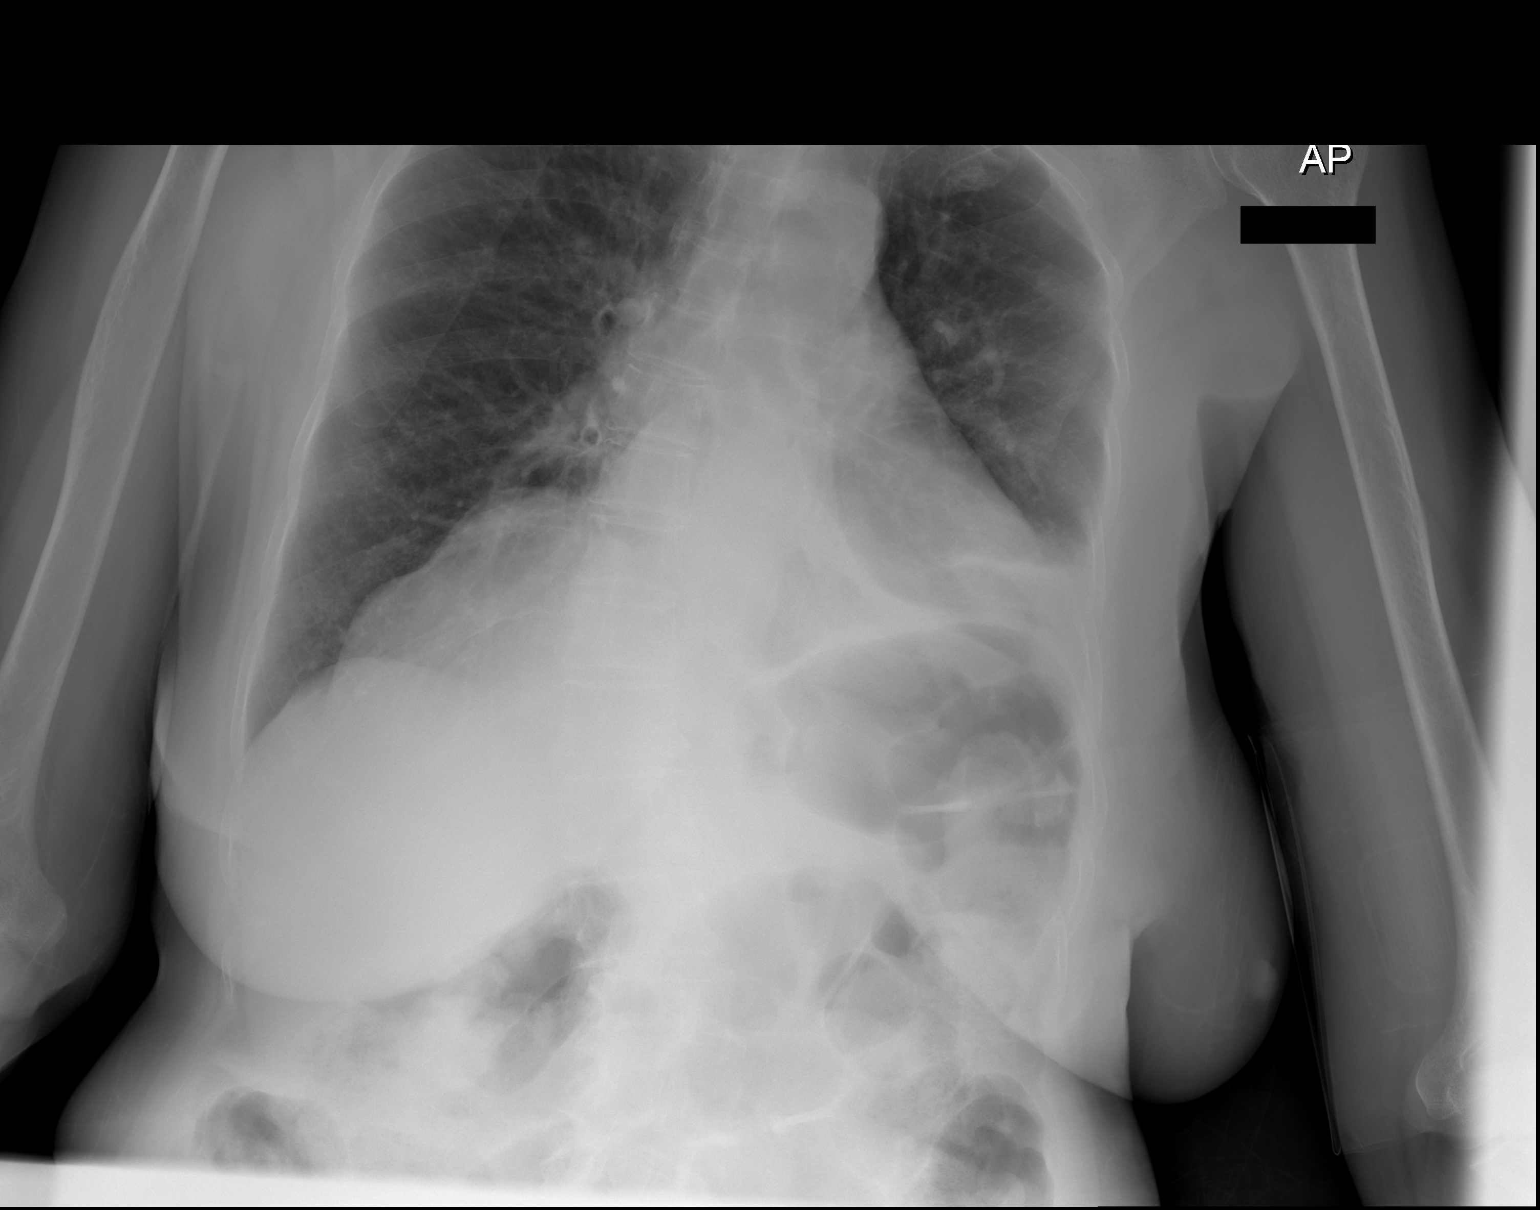

[w chest lat]
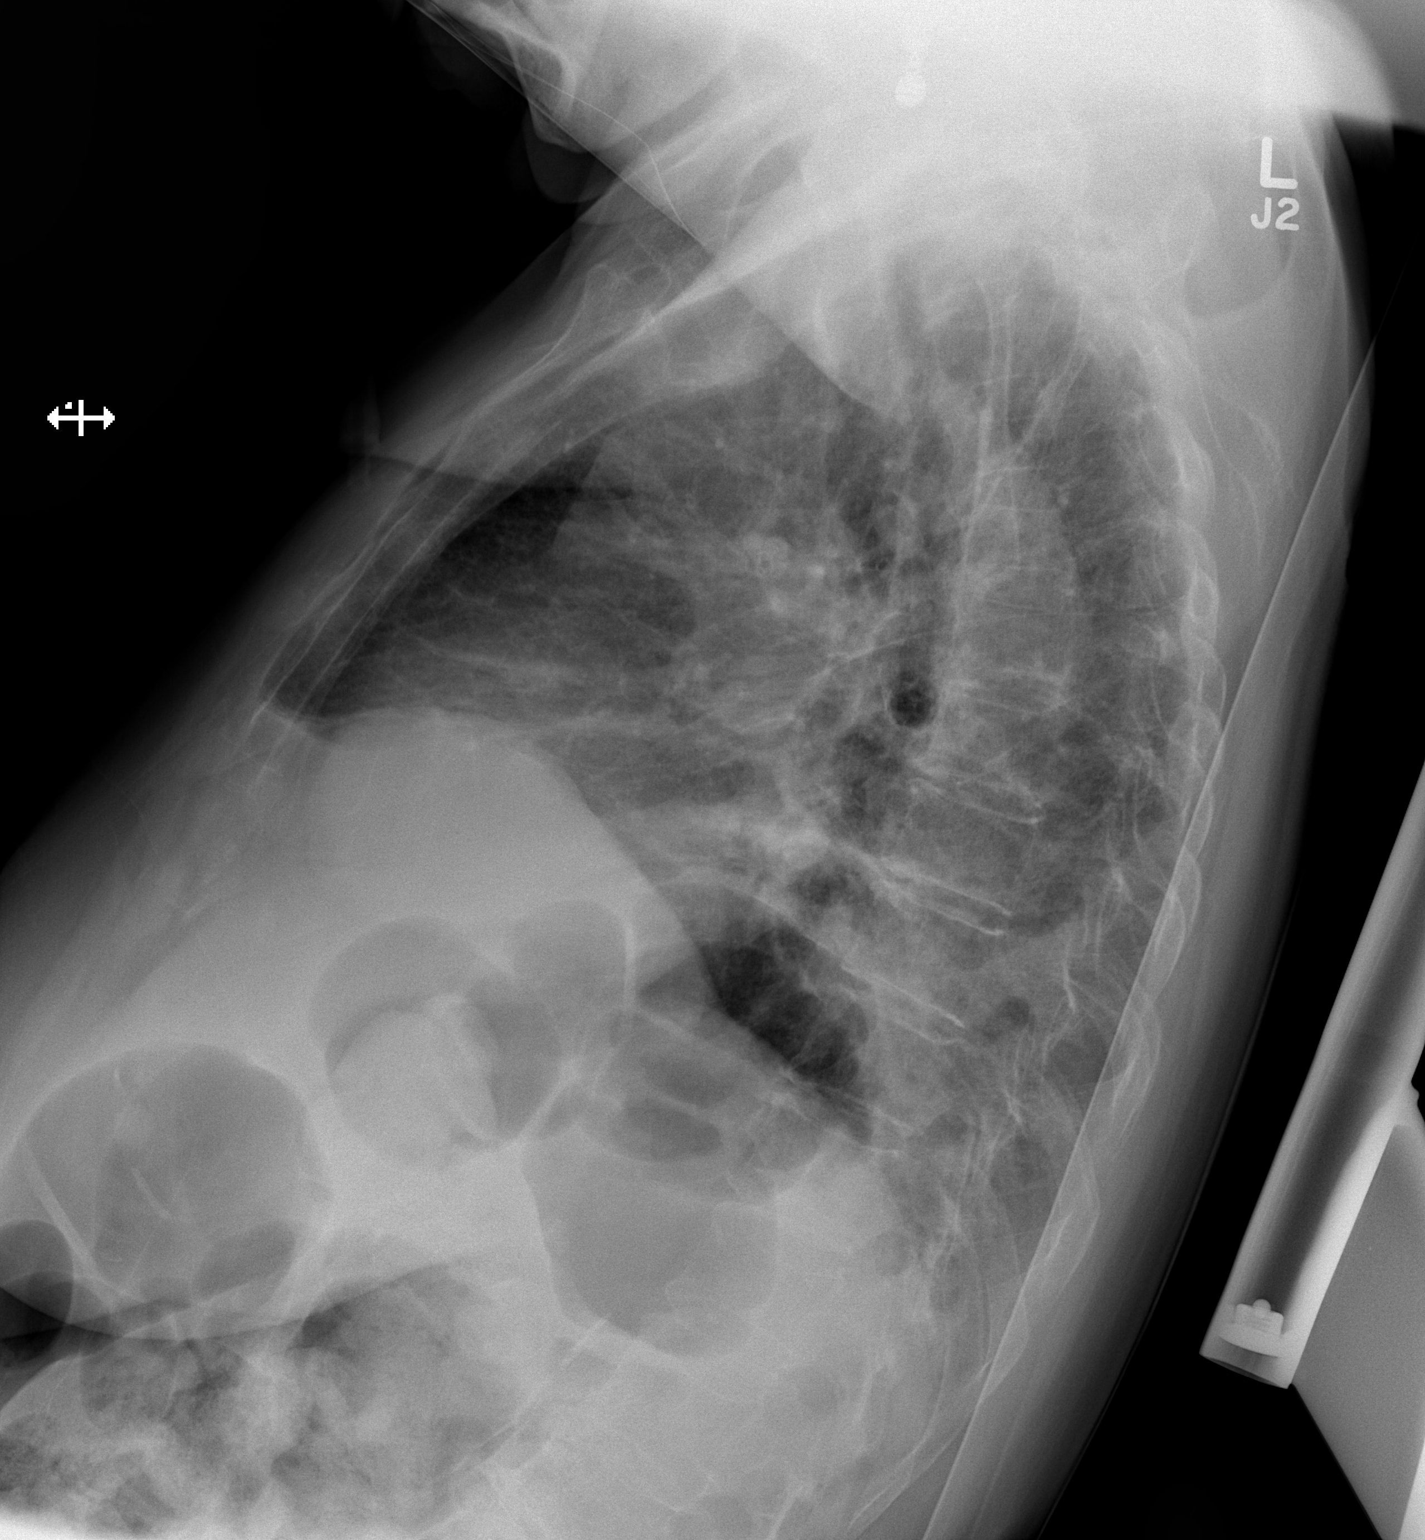

[2 of 2 positions shown; findings below may reference images not displayed]

FINDINGS: Exam demonstrates opacification over the left base likely a small
left-sided effusion with associated atelectasis although cannot
exclude infection in the left base. Slight elevation and
cardiomediastinal silhouette and remainder of the exam is unchanged.
Scalloping of the right hemidiaphragm unchanged.
IMPRESSION: Left basilar opacification likely small effusion with associated
atelectasis, although cannot exclude infection in the left base.

## 2015-04-29 IMAGING — CT CT ANGIO CHEST
2 of 8 series · 18 of 46 positions shown · IV contrast (OMNI)
Comparison: Chest x-ray obtained earlier today ; prior CT scan of
the abdomen and pelvis including the lung bases 11/02/2014

CLINICAL DATA: 74-year-old female with cough, shortness of breath
and abnormality in the left lung base seen on recent chest x-ray

EXAM:
CT ANGIOGRAPHY CHEST WITH CONTRAST
TECHNIQUE: Multidetector CT imaging of the chest was performed using the
standard protocol during bolus administration of intravenous
contrast. Multiplanar CT image reconstructions and MIPs were
obtained to evaluate the vascular anatomy.
CONTRAST:  49mL OMNIPAQUE IOHEXOL 350 MG/ML SOLN

[Series 5: thins · axial · 0.50mm/px · z∈[+1353,+1580]mm · 15 of 251 slices shown]
[im 12/251  lung]
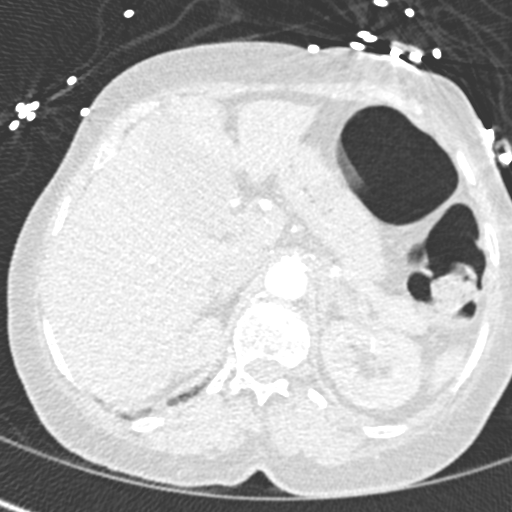
[im 35/251  soft-tissue]
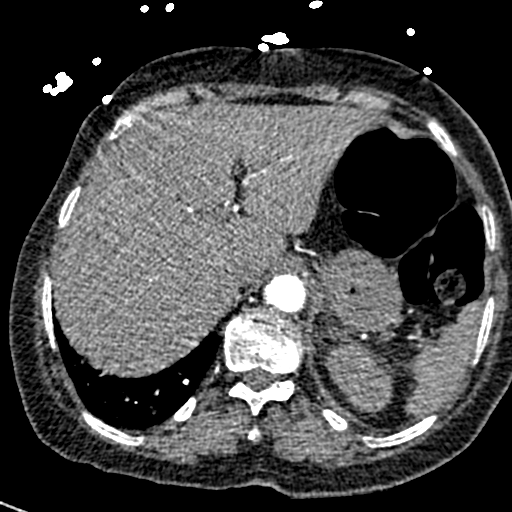
[im 46/251  lung]
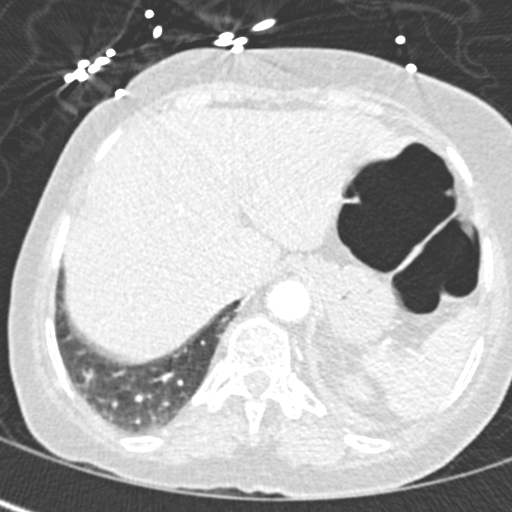
[im 57/251  soft-tissue]
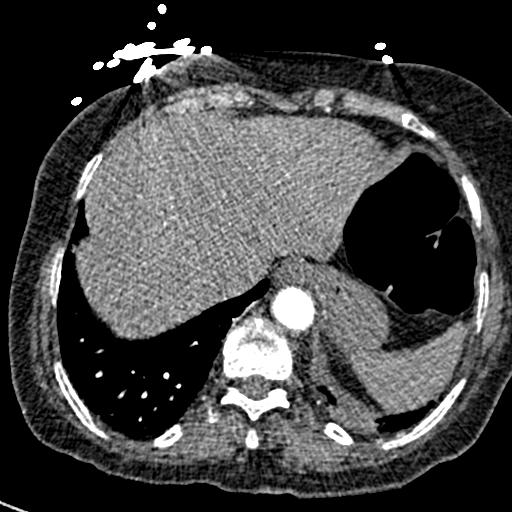
[im 80/251  lung]
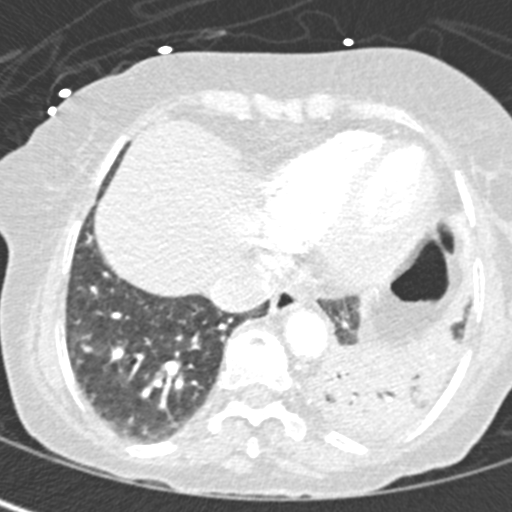
[im 91/251  soft-tissue]
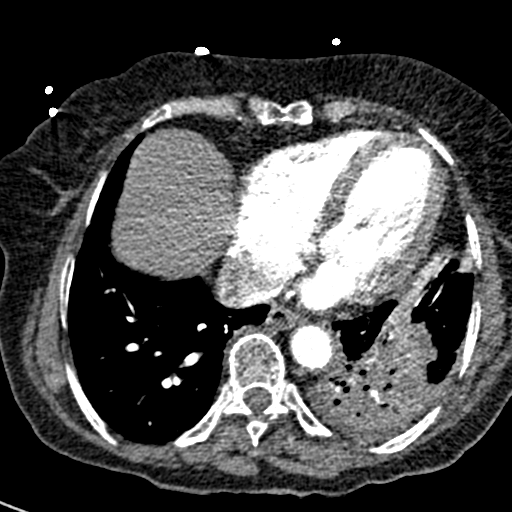
[im 114/251  lung]
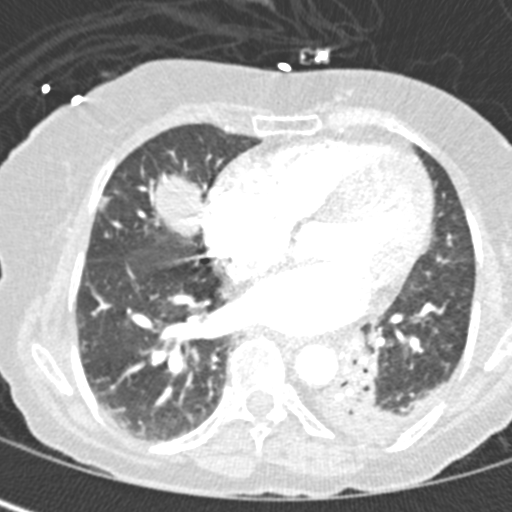
[im 126/251  soft-tissue]
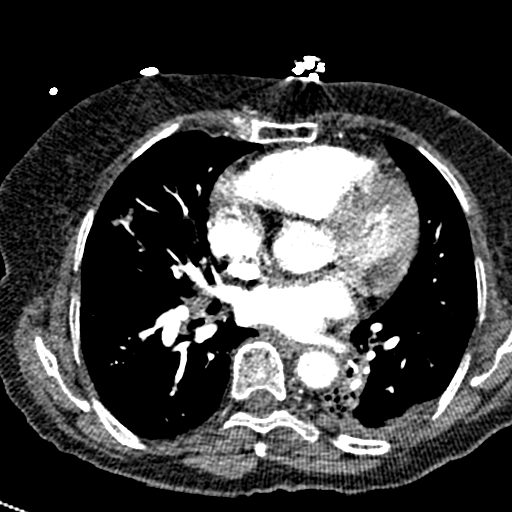
[im 137/251  lung]
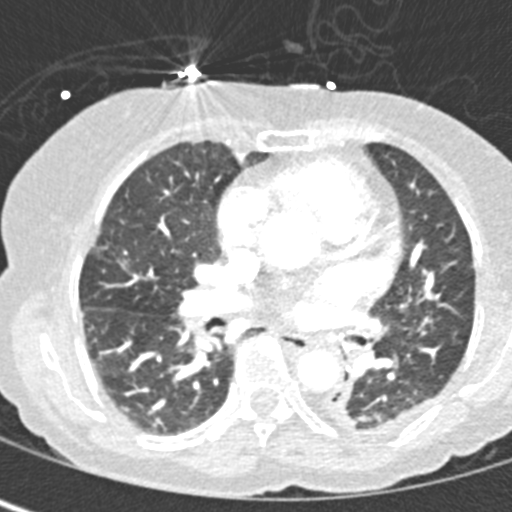
[im 160/251  soft-tissue]
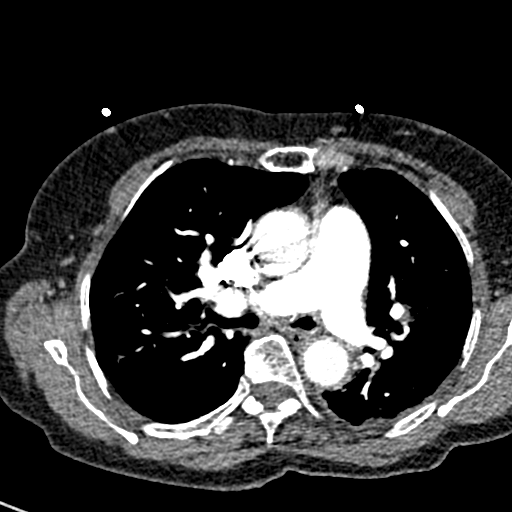
[im 171/251  lung]
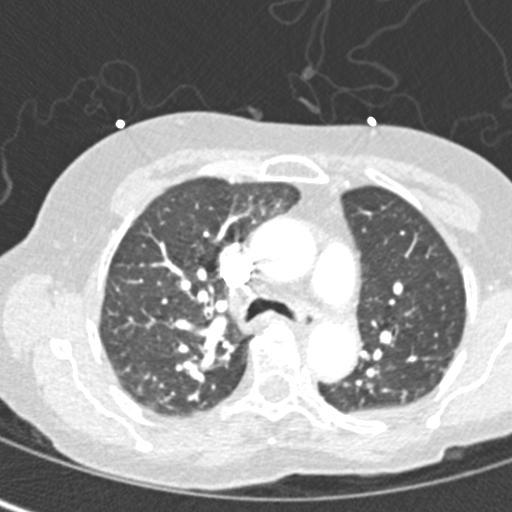
[im 194/251  soft-tissue]
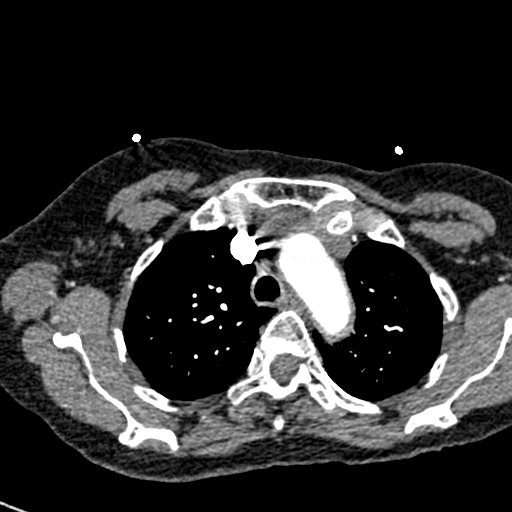
[im 205/251  lung]
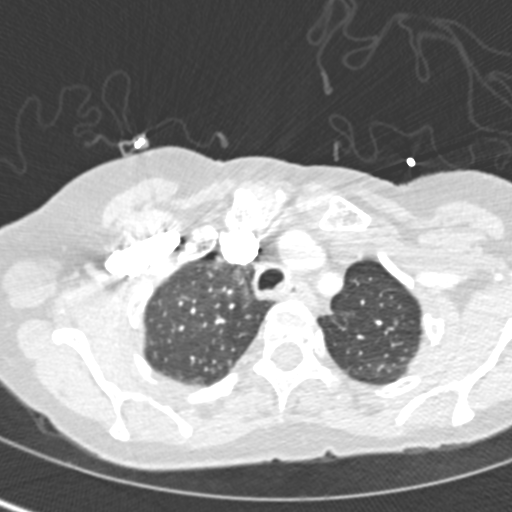
[im 216/251  soft-tissue]
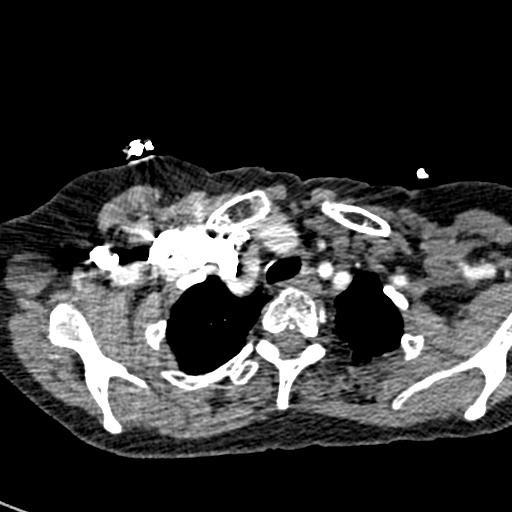
[im 239/251  lung]
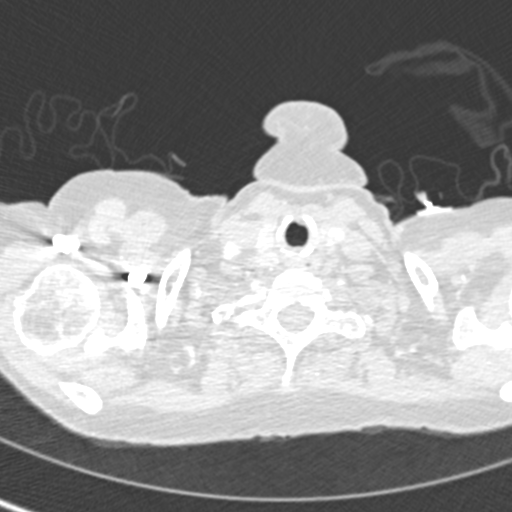

[Series 8: coronal mpr · coronal · 0.52mm/px · 3 of 93 slices shown]
[im 24/93  soft-tissue]
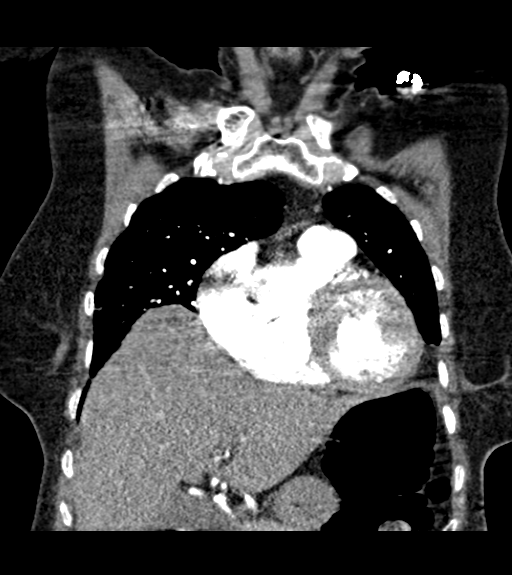
[im 47/93  soft-tissue]
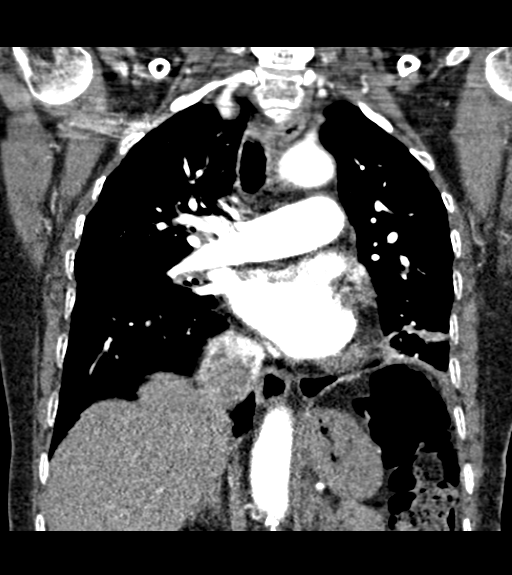
[im 70/93  soft-tissue]
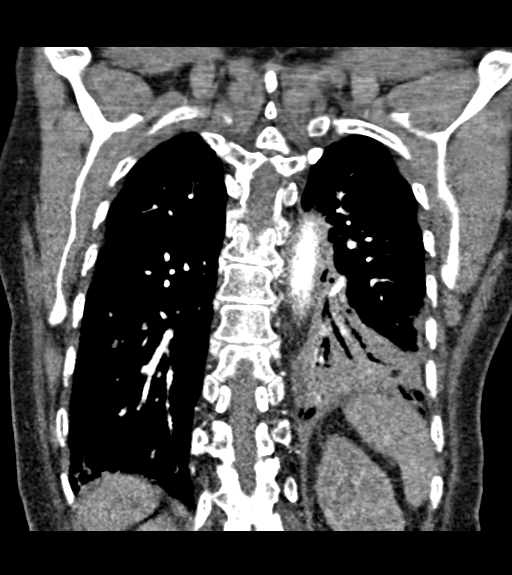

[18 of 46 positions shown; findings below may reference images not displayed]

FINDINGS: Mediastinum: Unremarkable CT appearance of the thyroid gland. No
suspicious mediastinal or hilar adenopathy. No soft tissue
mediastinal mass. The thoracic esophagus is unremarkable.

Heart/Vascular: 2 vessel aortic arch. Right brachiocephalic and left
common carotid artery share a common origin. No aneurysmal
dilatation. Adequate opacification of the pulmonary artery to the
proximal subsegmental level. No evidence of central filling defect
to suggest acute pulmonary embolus. However, the main pulmonary
artery is enlarged relative to the adjacent aorta suggesting the
possibility of underlying pulmonary arterial hypertension. Mild
cardiomegaly.

Lungs/Pleura: Left lower lobe consolidation and superimposed volume
loss. This represents an interval change compared to 10/31/2014.
Mild lower lobe bronchial wall thickening.

Bones/Soft Tissues: No acute fracture or aggressive appearing lytic
or blastic osseous lesion.

Upper Abdomen: Visualized upper abdominal organs are unremarkable.

Review of the MIP images confirms the above findings.
IMPRESSION: 1. Negative for acute pulmonary embolus.
2. Left lower lobe consolidation and volume loss concerning for
bronchopneumonia.
3. Mild cardiomegaly.
4. Mild enlargement of the main and central pulmonary arteries as
can be seen in the setting of pulmonary arterial hypertension.
5. Lower lobe bronchial wall thickening.

## 2015-06-08 ENCOUNTER — Emergency Department (HOSPITAL_COMMUNITY)
Admission: EM | Admit: 2015-06-08 | Discharge: 2015-06-09 | Disposition: A | Discharge status: Other Acute Inpatient Hospital | Payer: Medicare Other | Attending: Physician Assistant | Admitting: Physician Assistant

## 2015-06-08 ENCOUNTER — Encounter (HOSPITAL_COMMUNITY): Payer: Self-pay | Admitting: Emergency Medicine

## 2015-06-08 ENCOUNTER — Emergency Department (HOSPITAL_COMMUNITY): Payer: Medicare Other

## 2015-06-08 DIAGNOSIS — Z7982 Long term (current) use of aspirin: Secondary | ICD-10-CM | POA: Diagnosis not present

## 2015-06-08 DIAGNOSIS — R001 Bradycardia, unspecified: Secondary | ICD-10-CM | POA: Diagnosis not present

## 2015-06-08 DIAGNOSIS — J45909 Unspecified asthma, uncomplicated: Secondary | ICD-10-CM | POA: Diagnosis not present

## 2015-06-08 DIAGNOSIS — R402421 Glasgow coma scale score 9-12, in the field [EMT or ambulance]: Secondary | ICD-10-CM

## 2015-06-08 DIAGNOSIS — R4182 Altered mental status, unspecified: Secondary | ICD-10-CM | POA: Diagnosis not present

## 2015-06-08 DIAGNOSIS — E039 Hypothyroidism, unspecified: Secondary | ICD-10-CM | POA: Insufficient documentation

## 2015-06-08 DIAGNOSIS — E079 Disorder of thyroid, unspecified: Secondary | ICD-10-CM | POA: Diagnosis not present

## 2015-06-08 DIAGNOSIS — I10 Essential (primary) hypertension: Secondary | ICD-10-CM | POA: Insufficient documentation

## 2015-06-08 DIAGNOSIS — K219 Gastro-esophageal reflux disease without esophagitis: Secondary | ICD-10-CM | POA: Diagnosis not present

## 2015-06-08 DIAGNOSIS — Z79899 Other long term (current) drug therapy: Secondary | ICD-10-CM | POA: Diagnosis not present

## 2015-06-08 LAB — CBG MONITORING, ED: Glucose-Capillary: 95 mg/dL (ref 65–99)

## 2015-06-08 LAB — CORTISOL: Cortisol, Plasma: 12.1 ug/dL

## 2015-06-08 LAB — CBC WITH DIFFERENTIAL/PLATELET
Basophils Absolute: 0 10*3/uL (ref 0.0–0.1)
Basophils Relative: 0 %
Eosinophils Absolute: 0 10*3/uL (ref 0.0–0.7)
Eosinophils Relative: 1 %
HEMATOCRIT: 29.3 % — AB (ref 36.0–46.0)
HEMOGLOBIN: 10.6 g/dL — AB (ref 12.0–15.0)
LYMPHS PCT: 45 %
Lymphs Abs: 1.1 10*3/uL (ref 0.7–4.0)
MCH: 29.6 pg (ref 26.0–34.0)
MCHC: 36.2 g/dL — AB (ref 30.0–36.0)
MCV: 81.8 fL (ref 78.0–100.0)
MONOS PCT: 10 %
Monocytes Absolute: 0.2 10*3/uL (ref 0.1–1.0)
NEUTROS ABS: 1.1 10*3/uL — AB (ref 1.7–7.7)
Neutrophils Relative %: 44 %
Platelets: 242 10*3/uL (ref 150–400)
RBC: 3.58 MIL/uL — ABNORMAL LOW (ref 3.87–5.11)
RDW: 14.2 % (ref 11.5–15.5)
WBC: 2.5 10*3/uL — ABNORMAL LOW (ref 4.0–10.5)

## 2015-06-08 LAB — CREATININE, URINE, RANDOM: Creatinine, Urine: 77.57 mg/dL

## 2015-06-08 LAB — URINE MICROSCOPIC-ADD ON

## 2015-06-08 LAB — COMPREHENSIVE METABOLIC PANEL
ALK PHOS: 85 U/L (ref 38–126)
ALT: 51 U/L (ref 14–54)
AST: 80 U/L — AB (ref 15–41)
Albumin: 3.2 g/dL — ABNORMAL LOW (ref 3.5–5.0)
Anion gap: 11 (ref 5–15)
BILIRUBIN TOTAL: 0.5 mg/dL (ref 0.3–1.2)
BUN: 20 mg/dL (ref 6–20)
CALCIUM: 9.2 mg/dL (ref 8.9–10.3)
CO2: 23 mmol/L (ref 22–32)
CREATININE: 0.94 mg/dL (ref 0.44–1.00)
Chloride: 89 mmol/L — ABNORMAL LOW (ref 101–111)
GFR, EST NON AFRICAN AMERICAN: 58 mL/min — AB (ref >60)
Glucose, Bld: 103 mg/dL — ABNORMAL HIGH (ref 65–99)
Potassium: 5.2 mmol/L — ABNORMAL HIGH (ref 3.5–5.1)
Sodium: 123 mmol/L — ABNORMAL LOW (ref 135–145)
Total Protein: 6.6 g/dL (ref 6.5–8.1)

## 2015-06-08 LAB — URINALYSIS, ROUTINE W REFLEX MICROSCOPIC
Bilirubin Urine: NEGATIVE
GLUCOSE, UA: 100 mg/dL — AB
HGB URINE DIPSTICK: NEGATIVE
Ketones, ur: NEGATIVE mg/dL
Leukocytes, UA: NEGATIVE
Nitrite: NEGATIVE
Specific Gravity, Urine: 1.015 (ref 1.005–1.030)
Urobilinogen, UA: 1 mg/dL (ref 0.0–1.0)
pH: 6.5 (ref 5.0–8.0)

## 2015-06-08 LAB — RAPID URINE DRUG SCREEN, HOSP PERFORMED
AMPHETAMINES: NOT DETECTED
BENZODIAZEPINES: NOT DETECTED
Barbiturates: NOT DETECTED
COCAINE: NOT DETECTED
OPIATES: NOT DETECTED
Tetrahydrocannabinol: NOT DETECTED

## 2015-06-08 LAB — I-STAT CG4 LACTIC ACID, ED
Lactic Acid, Venous: 0.6 mmol/L (ref 0.5–2.0)
Lactic Acid, Venous: 0.34 mmol/L — ABNORMAL LOW (ref 0.5–2.0)

## 2015-06-08 LAB — BRAIN NATRIURETIC PEPTIDE: B NATRIURETIC PEPTIDE 5: 375.5 pg/mL — AB (ref 0.0–100.0)

## 2015-06-08 LAB — PROTIME-INR
INR: 0.98 (ref 0.00–1.49)
PROTHROMBIN TIME: 13.2 s (ref 11.6–15.2)

## 2015-06-08 LAB — AMMONIA: AMMONIA: 33 umol/L (ref 9–35)

## 2015-06-08 LAB — I-STAT TROPONIN, ED: Troponin i, poc: 0.01 ng/mL (ref 0.00–0.08)

## 2015-06-08 LAB — SALICYLATE LEVEL: Salicylate Lvl: <4 mg/dL (ref 2.8–30.0)

## 2015-06-08 LAB — NA AND K (SODIUM & POTASSIUM), RAND UR
Potassium Urine: 26 mmol/L
Sodium, Ur: 28 mmol/L

## 2015-06-08 LAB — T4, FREE: Free T4: 1.17 ng/dL — ABNORMAL HIGH (ref 0.61–1.12)

## 2015-06-08 LAB — ACETAMINOPHEN LEVEL

## 2015-06-08 LAB — TSH: TSH: 11.92 u[IU]/mL — ABNORMAL HIGH (ref 0.350–4.500)

## 2015-06-08 LAB — ETHANOL: Alcohol, Ethyl (B): <5 mg/dL (ref <5)

## 2015-06-08 MED ORDER — AMMONIA AROMATIC IN INHA
RESPIRATORY_TRACT | Status: AC
  Filled 2015-06-08: qty 10

## 2015-06-08 MED ORDER — SODIUM CHLORIDE 0.9 % IV BOLUS (SEPSIS)
1000.0000 mL | Freq: Once | INTRAVENOUS | Status: AC
  Administered 2015-06-08: 1000 mL via INTRAVENOUS

## 2015-06-08 MED ORDER — LEVOTHYROXINE SODIUM 100 MCG IV SOLR
200.0000 ug | Freq: Once | INTRAVENOUS | Status: AC
  Administered 2015-06-08: 200 ug via INTRAVENOUS
  Filled 2015-06-08: qty 10

## 2015-06-08 MED ORDER — HYDROCORTISONE NA SUCCINATE PF 100 MG IJ SOLR
100.0000 mg | Freq: Once | INTRAMUSCULAR | Status: AC
  Administered 2015-06-08: 100 mg via INTRAVENOUS
  Filled 2015-06-08: qty 2

## 2015-06-08 MED ORDER — LIOTHYRONINE SODIUM 10 MCG/ML IV SOLN
10.0000 ug | Freq: Once | INTRAVENOUS | Status: AC
  Administered 2015-06-08: 10 ug via INTRAVENOUS
  Filled 2015-06-08: qty 1

## 2015-06-08 NOTE — ED Notes (Signed)
Pt unable to answer screening questions due to current status.

## 2015-06-08 NOTE — ED Notes (Signed)
Pt following commands to open and close mouth to take temperature.

## 2015-06-08 NOTE — ED Provider Notes (Addendum)
CSN: [id number]     Arrival date & time 06/08/15  1655 History   First MD Initiated Contact with Patient 06/08/15 1707     Chief Complaint  Patient presents with  . Altered Mental Status     (Consider location/radiation/quality/duration/timing/severity/associated sxs/prior Treatment) Patient is a 74 y.o. female presenting with altered mental status.  Altered Mental Status    Patient is a 74 year old female with history of hypothyroidism hypertension and GERD presenting today with altered mental status. Patient's daughter reports that today at noon she became increasingly quiet. She was refusing to respond. Patient this morning at 10 AM took her normal medications and then went on for now. Last seen normal was at noon.  On arrival to the ED patient is behaving obtunded. Patient grimaces to sharp touch and to ammonia. She squeezes her eyes against resistance to opening. However she's refusing to follow commands.    Past Medical History  Diagnosis Date  . Hypertension   . Hypothyroidism   . HTN (hypertension)   . GERD (gastroesophageal reflux disease)   . Asthma   . Thyroid disease     not know in detail   Past Surgical History  Procedure Laterality Date  . Capsulotomy  08/23/2011    Procedure: MINOR CAPSULOTOMY;  Surgeon: Myrtha Mantis.;  Location: [facility];  Service: Ophthalmology;  Laterality: Left;  YAG Capsulotomy Left Eye  . Esophagogastroduodenoscopy N/A 03/17/2013    Procedure: ESOPHAGOGASTRODUODENOSCOPY (EGD);  Surgeon: Irene Shipper, MD;  Location: [facility] ENDOSCOPY;  Service: Endoscopy;  Laterality: N/A;  . Left cataract extraction      2007  . Right cataract extraction      2006  . Appendectomy    . Back surgery     No family history on file. Social History  Substance Use Topics  . Smoking status: Never Smoker   . Smokeless tobacco: None  . Alcohol Use: No   OB History    Gravida Para Term Preterm AB TAB SAB Ectopic Multiple Living   0 0 0 0 0 0 0 0        Review of Systems  Unable to perform ROS: Mental status change      Allergies  Review of patient's allergies indicates no known allergies.  Home Medications   Prior to Admission medications   Medication Sig Start Date End Date Taking? Authorizing Provider  acetaminophen (TYLENOL) 325 MG tablet Take 325 mg by mouth every 6 (six) hours as needed for mild pain, moderate pain or headache.   Yes Historical Provider, MD  amLODipine (NORVASC) 5 MG tablet Take 1 tablet (5 mg total) by mouth daily. 11/03/14  Yes Eugenie Filler, MD  aspirin EC 81 MG EC tablet Take 1 tablet (81 mg total) by mouth daily. 02/18/15  Yes Velvet Bathe, MD  Azilsartan Medoxomil (EDARBI) 40 MG TABS Take 40 mg by mouth daily at 12 noon.   Yes Historical Provider, MD  furosemide (LASIX) 20 MG tablet Take 1 tablet (20 mg total) by mouth daily. Foot swelling 02/18/15  Yes Velvet Bathe, MD  hydrochlorothiazide (MICROZIDE) 12.5 MG capsule Take 12.5 mg by mouth daily.   Yes Historical Provider, MD  isosorbide mononitrate (IMDUR) 60 MG 24 hr tablet Take 60 mg by mouth daily at 12 noon. 06/07/15  Yes Historical Provider, MD  lansoprazole (PREVACID) 30 MG capsule Take 30 mg by mouth daily.     Yes Historical Provider, MD  latanoprost (XALATAN) 0.005 % ophthalmic solution Place 1 drop  into the left eye at bedtime as needed (glaucoma).    Yes Historical Provider, MD  levothyroxine (SYNTHROID, LEVOTHROID) 50 MCG tablet Take 1 tablet (50 mcg total) by mouth daily before breakfast. 02/18/15  Yes Velvet Bathe, MD  metoprolol succinate (TOPROL-XL) 50 MG 24 hr tablet Take 50 mg by mouth daily. 09/05/14  Yes Historical Provider, MD  NASONEX 50 MCG/ACT nasal spray Place 2 sprays into both nostrils daily as needed (congestion, rhinitis).  05/12/15  Yes Historical Provider, MD  PROAIR HFA 108 (90 BASE) MCG/ACT inhaler Inhale 2 puffs into the lungs 4 (four) times daily as needed. Shortness of breath 01/19/15  Yes Historical Provider, MD  Vitamin D,  Ergocalciferol, (DRISDOL) 50000 UNITS CAPS capsule Take 50,000 Units by mouth once a week. Sundays 05/11/15  Yes Historical Provider, MD  amoxicillin-clavulanate (AUGMENTIN) 500-125 MG per tablet Take 1 tablet (500 mg total) by mouth 2 (two) times daily. Patient not taking: Reported on 06/08/2015 02/18/15   Velvet Bathe, MD  isosorbide mononitrate (IMDUR) 30 MG 24 hr tablet Take 0.5 tablets (15 mg total) by mouth daily. Patient not taking: Reported on 06/08/2015 02/18/15   Velvet Bathe, MD  levofloxacin (LEVAQUIN) 500 MG tablet Take 1 tablet (500 mg total) by mouth daily. Patient not taking: Reported on 06/08/2015 12/21/14   Jola Schmidt, MD   BP 134/43 mmHg  Pulse 62  Temp(Src) 97.4 F (36.3 C) (Oral)  Resp 16  SpO2 100% Physical Exam  Constitutional: She appears well-developed and well-nourished.  Patient's initial temperature 92 degrees.  HENT:  Head: Normocephalic and atraumatic.  Eyes: Conjunctivae are normal. Right eye exhibits no discharge.  Neck: Neck supple.  Cardiovascular: Regular rhythm and normal heart sounds.   No murmur heard. Bradycardic  Pulmonary/Chest: Effort normal and breath sounds normal.  Abdominal: Soft. She exhibits no distension. There is no tenderness.  Musculoskeletal: She exhibits no edema.  Neurological:  Patient resistant to attempts at opening eyes or moving hand. However not following commands. Patient grimaces to pain or ammonia. Patient appears to be protecting her airway.  Skin: Skin is warm and dry. No rash noted. She is not diaphoretic.  Nursing note and vitals reviewed.   ED Course  Procedures (including critical care time) Labs Review Labs Reviewed  CBC WITH DIFFERENTIAL/PLATELET - Abnormal; Notable for the following:    WBC 2.5 (*)    RBC 3.58 (*)    Hemoglobin 10.6 (*)    HCT 29.3 (*)    MCHC 36.2 (*)    Neutro Abs 1.1 (*)    All other components within normal limits  COMPREHENSIVE METABOLIC PANEL - Abnormal; Notable for the  following:    Sodium 123 (*)    Potassium 5.2 (*)    Chloride 89 (*)    Glucose, Bld 103 (*)    Albumin 3.2 (*)    AST 80 (*)    GFR calc non Af Amer 58 (*)    All other components within normal limits  URINALYSIS, ROUTINE W REFLEX MICROSCOPIC (NOT AT [facility]) - Abnormal; Notable for the following:    Glucose, UA 100 (*)    Protein, ur >300 (*)    All other components within normal limits  BRAIN NATRIURETIC PEPTIDE - Abnormal; Notable for the following:    B Natriuretic Peptide 375.5 (*)    All other components within normal limits  TSH - Abnormal; Notable for the following:    TSH 11.920 (*)    All other components within normal limits  I-STAT  CG4 LACTIC ACID, ED - Abnormal; Notable for the following:    Lactic Acid, Venous 0.34 (*)    All other components within normal limits  URINE CULTURE  CULTURE, BLOOD (ROUTINE X 2)  CULTURE, BLOOD (ROUTINE X 2)  AMMONIA  PROTIME-INR  URINE RAPID DRUG SCREEN, HOSP PERFORMED  NA AND K (SODIUM & POTASSIUM), RAND UR  CREATININE, URINE, RANDOM  URINE MICROSCOPIC-ADD ON  T4, FREE  CORTISOL  CBG MONITORING, ED  I-STAT CG4 LACTIC ACID, ED  I-STAT TROPOININ, ED    Imaging Review Ct Head Wo Contrast  06/08/2015  CLINICAL DATA:  Altered mental status.  Facial droop.  Unresponsive. EXAM: CT HEAD WITHOUT CONTRAST TECHNIQUE: Contiguous axial images were obtained from the base of the skull through the vertex without intravenous contrast. COMPARISON:  12/23/2014 FINDINGS: There is atrophy and chronic small vessel disease changes. No acute intracranial abnormality. Specifically, no hemorrhage, hydrocephalus, mass lesion, acute infarction, or significant intracranial injury. No acute calvarial abnormality. IMPRESSION: No acute intracranial abnormality. Atrophy, chronic microvascular disease. Critical Value/emergent results were called by telephone at the time of interpretation on 06/08/2015 at 5:43 pm to Dr. Leonel Ramsay, who verbally acknowledged these  results. Electronically Signed   By: Rolm Baptise M.D.   On: 06/08/2015 17:43   Dg Chest Portable 1 View  06/08/2015  CLINICAL DATA:  Altered mental status, shortness of breath for 1 day. EXAM: PORTABLE CHEST 1 VIEW COMPARISON:  12/20/2014 FINDINGS: Heart is borderline in size. No confluent airspace opacities, effusions or edema. No acute bony abnormality. IMPRESSION: No active disease. Electronically Signed   By: Rolm Baptise M.D.   On: 06/08/2015 18:23   I have personally reviewed and evaluated these images and lab results as part of my medical decision-making.   EKG Interpretation   Date/Time:  Wednesday June 08 2015 17:46:00 EDT Ventricular Rate:  49 PR Interval:  206 QRS Duration: 79 QT Interval:  524 QTC Calculation: 473 R Axis:   49 Text Interpretation:  Sinus bradycardia Marked sinus bradycardia Since  last tracing rate slower Confirmed by Gerald Leitz ([id number]) on  06/08/2015 6:30:09 PM      MDM   Final diagnoses:  None    Patient is a 74 year old  female presented in today with altered mental status. Last known normal at noon.  We called code stroke given patient's low GCS. However I have low suspicion for stroke. I am suspicious for metabolic derangements causing an encephalopathy. Considering sepsis as etiology as well. In particular concerned about myxedema coma. Patient hypothermic, bradycardic, increasing concern for myxedema coma  Neurology saw patient and agree with plan to workup metabolic derarrangements.  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 Would like to discuss with endocrinology.   Endocrinology initially paged at 7:30 PM. The nurse on call called back and said that we called the wrong number. We then attempted to call a different endocrinologist who is also not right person on call. We have now paged [facility] endocrinology. Awaiting callback.  9:08 PM' Still no call back.   Calledback at 9:40- there is no person on call for endocrinology  at [facility].  T4 and cortisol drawn.  And no other expiration for this. 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 Hydrocortisone 100 mg IV, levothyroxinee 200 and T3 IV 10 mcg. I talked to critical care. They said that she is safe for stepdown. Will call for admission.  [facility] not feel  comfortable admitting patient without endocrine. They recommend call [facility].   10:08 PM [facility] accepted patient. Will transfer.   Harlow Carrizales Julio Alm, MD 06/08/15 2108  Bowden Boody Julio Alm, MD 06/08/15 2209

## 2015-06-08 NOTE — ED Notes (Signed)
Pt from home for eval of AMS, grandaughter states pt woke up at 10am and received lasix and home meds then reportedly went back to bed. Grand daughter tried to wake pt up at noon and pt was unresponsive. EMS noted pt flexes from pain but no other response given by pt. Pt with eyes closed, unresponsive. VSS-airway intact.

## 2015-06-08 NOTE — ED Notes (Signed)
Code Stroke canceled per Dr. Leonel Ramsay. No need for complete NIHSS per Dr. Leonel Ramsay .

## 2015-06-08 NOTE — Consult Note (Signed)
Neurology Consultation Reason for Consult: Altered mental status Referring Physician: [place], C  CC: Altered mental status  History is obtained from: Family  HPI: Katie Mosley is a 74 y.o. female who was in her normal state of health this morning, then around noon began having progressive worsening of her mental status to the point where she became unresponsive. She was therefore brought in by EMS and a code stroke was activated.  Per family, she has not had any antecedent illness.  She does have a history of thyroid disease, takes Synthroid and manages her medicine.   ROS:  Unable to obtain due to altered mental status.   Past Medical History  Diagnosis Date  . Hypertension   . Hypothyroidism   . HTN (hypertension)   . GERD (gastroesophageal reflux disease)   . Asthma   . Thyroid disease     not know in detail     Family history: Unable to obtain due to altered mental status  Social History:  reports that she has never smoked. She does not have any smokeless tobacco history on file. She reports that she does not drink alcohol or use illicit drugs.   Exam: Current vital signs: BP 160/67 mmHg  Pulse 51  Temp(Src) 92.5 F (33.6 C) (Rectal)  Resp 18  SpO2 100% Vital signs in last 24 hours: Temp:  [92.5 F (33.6 C)] 92.5 F (33.6 C) (10/12 1700) Pulse Rate:  [51-54] 51 (10/12 1715) Resp:  [12-18] 18 (10/12 1715) BP: (160-178)/(62-67) 160/67 mmHg (10/12 1715) SpO2:  [99 %-100 %] 100 % (10/12 1715)  Physical Exam  Constitutional: Appears well-developed and well-nourished.  Psych: Does not respond Eyes: No scleral injection HENT: No OP obstrucion Head: Normocephalic.  Cardiovascular: Normal rate and regular rhythm.  Respiratory: Effort normal  GI: Soft.  No distension. There is no tenderness.  Skin: WDI  Neuro: Mental Status: She keeps her eyes tightly shut, actively resisting eye opening. She does not follow commands. Cranial Nerves: II: She does not  blink to threat from either side. Right pupil is 3 mm and reactive, left pupil is postsurgical III,IV, VI: Eyes are midline, she does not clearly look either side including with doll's eye maneuver.(This is unreliable due to the patient not being comatose) V: Responds to noxious stimuli bilaterally VII: No marked asymmetry VIII, X, XI, XII: Unable to assess secondary to patient's altered mental status.  Motor: Tone is normal. Bulk is normal. She moves all extremities voluntarily and holds at least her right arm against gravity. She appears to move both sides symmetrically. Sensory: She response to noxious stimulation in all 4 extremities Cerebellar: Unable to obtain due to altered mental status.   I have reviewed labs in epic and the results pertinent to this consultation are: Hyponatremia  I have reviewed the images obtained: CT head- no acute findings  Impression: 74 year old female with progressive altered mental status and hypothermia. Her presentation appears most consistent with a metabolic encephalopathy of some type. 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 Also needing to be considered would be septic encephalopathy, drugs of abuse, hyperammonemia.  Recommendations: 1) TSH, ammonia, UDS 2) urine culture, urinalysis, blood cultures 3) if no clear other cause, then would proceed with MRI/EEG 4) neurology will continue to follow.   Roland Rack, MD [facility] [phone]  If 7pm- 7am, please page neurology on call as listed in [place].

## 2015-06-08 NOTE — ED Notes (Signed)
Speaks [place]

## 2015-06-08 NOTE — ED Notes (Signed)
Bear Hugger removed

## 2015-06-08 NOTE — ED Notes (Signed)
Pt transported to CT by primary RN.

## 2015-06-09 LAB — URINE CULTURE: CULTURE: NO GROWTH

## 2015-06-13 LAB — CULTURE, BLOOD (ROUTINE X 2)
CULTURE: NO GROWTH
Culture: NO GROWTH

## 2015-08-07 ENCOUNTER — Emergency Department (HOSPITAL_COMMUNITY): Payer: Medicare Other

## 2015-08-07 ENCOUNTER — Encounter (HOSPITAL_COMMUNITY): Payer: Self-pay

## 2015-08-07 ENCOUNTER — Inpatient Hospital Stay (HOSPITAL_COMMUNITY)
Admission: EM | Admit: 2015-08-07 | Discharge: 2015-08-13 | DRG: 643 | Disposition: A | Discharge status: Home Health | Payer: Medicare Other | Attending: Internal Medicine | Admitting: Internal Medicine

## 2015-08-07 DIAGNOSIS — Z79899 Other long term (current) drug therapy: Secondary | ICD-10-CM

## 2015-08-07 DIAGNOSIS — I10 Essential (primary) hypertension: Secondary | ICD-10-CM | POA: Diagnosis present

## 2015-08-07 DIAGNOSIS — N179 Acute kidney failure, unspecified: Secondary | ICD-10-CM | POA: Diagnosis present

## 2015-08-07 DIAGNOSIS — E039 Hypothyroidism, unspecified: Secondary | ICD-10-CM | POA: Diagnosis present

## 2015-08-07 DIAGNOSIS — E2749 Other adrenocortical insufficiency: Secondary | ICD-10-CM | POA: Diagnosis not present

## 2015-08-07 DIAGNOSIS — R410 Disorientation, unspecified: Secondary | ICD-10-CM | POA: Diagnosis not present

## 2015-08-07 DIAGNOSIS — E861 Hypovolemia: Secondary | ICD-10-CM | POA: Diagnosis present

## 2015-08-07 DIAGNOSIS — G9341 Metabolic encephalopathy: Secondary | ICD-10-CM | POA: Diagnosis present

## 2015-08-07 DIAGNOSIS — N189 Chronic kidney disease, unspecified: Secondary | ICD-10-CM | POA: Diagnosis present

## 2015-08-07 DIAGNOSIS — I130 Hypertensive heart and chronic kidney disease with heart failure and stage 1 through stage 4 chronic kidney disease, or unspecified chronic kidney disease: Secondary | ICD-10-CM | POA: Diagnosis present

## 2015-08-07 DIAGNOSIS — I169 Hypertensive crisis, unspecified: Secondary | ICD-10-CM | POA: Diagnosis present

## 2015-08-07 DIAGNOSIS — R680 Hypothermia, not associated with low environmental temperature: Secondary | ICD-10-CM | POA: Diagnosis present

## 2015-08-07 DIAGNOSIS — R4182 Altered mental status, unspecified: Secondary | ICD-10-CM | POA: Diagnosis present

## 2015-08-07 DIAGNOSIS — E875 Hyperkalemia: Secondary | ICD-10-CM | POA: Diagnosis present

## 2015-08-07 DIAGNOSIS — R0602 Shortness of breath: Secondary | ICD-10-CM

## 2015-08-07 DIAGNOSIS — R740 Nonspecific elevation of levels of transaminase and lactic acid dehydrogenase [LDH]: Secondary | ICD-10-CM | POA: Diagnosis present

## 2015-08-07 DIAGNOSIS — T68XXXA Hypothermia, initial encounter: Secondary | ICD-10-CM | POA: Diagnosis present

## 2015-08-07 DIAGNOSIS — I509 Heart failure, unspecified: Secondary | ICD-10-CM

## 2015-08-07 DIAGNOSIS — K5900 Constipation, unspecified: Secondary | ICD-10-CM | POA: Insufficient documentation

## 2015-08-07 DIAGNOSIS — G9340 Encephalopathy, unspecified: Secondary | ICD-10-CM | POA: Insufficient documentation

## 2015-08-07 DIAGNOSIS — I5033 Acute on chronic diastolic (congestive) heart failure: Secondary | ICD-10-CM | POA: Diagnosis present

## 2015-08-07 DIAGNOSIS — E871 Hypo-osmolality and hyponatremia: Secondary | ICD-10-CM | POA: Diagnosis present

## 2015-08-07 HISTORY — DX: Unspecified adrenocortical insufficiency: E27.40

## 2015-08-07 HISTORY — DX: Unspecified glaucoma: H40.9

## 2015-08-07 HISTORY — DX: Heart failure, unspecified: I50.9

## 2015-08-07 LAB — COMPREHENSIVE METABOLIC PANEL
ALK PHOS: 110 U/L (ref 38–126)
ALT: 61 U/L — ABNORMAL HIGH (ref 14–54)
ANION GAP: 7 (ref 5–15)
AST: 93 U/L — ABNORMAL HIGH (ref 15–41)
Albumin: 3.1 g/dL — ABNORMAL LOW (ref 3.5–5.0)
BILIRUBIN TOTAL: 0.7 mg/dL (ref 0.3–1.2)
BUN: 34 mg/dL — ABNORMAL HIGH (ref 6–20)
CALCIUM: 9.9 mg/dL (ref 8.9–10.3)
CO2: 24 mmol/L (ref 22–32)
Chloride: 95 mmol/L — ABNORMAL LOW (ref 101–111)
Creatinine, Ser: 1.92 mg/dL — ABNORMAL HIGH (ref 0.44–1.00)
GFR calc non Af Amer: 25 mL/min — ABNORMAL LOW (ref >60)
GFR, EST AFRICAN AMERICAN: 29 mL/min — AB (ref >60)
Glucose, Bld: 136 mg/dL — ABNORMAL HIGH (ref 65–99)
Potassium: 5.9 mmol/L — ABNORMAL HIGH (ref 3.5–5.1)
SODIUM: 126 mmol/L — AB (ref 135–145)
TOTAL PROTEIN: 7 g/dL (ref 6.5–8.1)

## 2015-08-07 LAB — I-STAT TROPONIN, ED: Troponin i, poc: 0.02 ng/mL (ref 0.00–0.08)

## 2015-08-07 LAB — I-STAT CG4 LACTIC ACID, ED: LACTIC ACID, VENOUS: 1.41 mmol/L (ref 0.5–2.0)

## 2015-08-07 LAB — CBC WITH DIFFERENTIAL/PLATELET
BASOS ABS: 0 10*3/uL (ref 0.0–0.1)
BASOS PCT: 0 %
EOS ABS: 0 10*3/uL (ref 0.0–0.7)
EOS PCT: 0 %
HCT: 32.9 % — ABNORMAL LOW (ref 36.0–46.0)
Hemoglobin: 11.4 g/dL — ABNORMAL LOW (ref 12.0–15.0)
Lymphocytes Relative: 27 %
Lymphs Abs: 0.9 10*3/uL (ref 0.7–4.0)
MCH: 29.9 pg (ref 26.0–34.0)
MCHC: 34.7 g/dL (ref 30.0–36.0)
MCV: 86.4 fL (ref 78.0–100.0)
Monocytes Absolute: 0.2 10*3/uL (ref 0.1–1.0)
Monocytes Relative: 4 %
Neutro Abs: 2.3 10*3/uL (ref 1.7–7.7)
Neutrophils Relative %: 69 %
Platelets: 246 10*3/uL (ref 150–400)
RBC: 3.81 MIL/uL — AB (ref 3.87–5.11)
RDW: 15.8 % — ABNORMAL HIGH (ref 11.5–15.5)
WBC: 3.4 10*3/uL — AB (ref 4.0–10.5)

## 2015-08-07 LAB — I-STAT CHEM 8, ED
BUN: 33 mg/dL — AB (ref 6–20)
CREATININE: 1.8 mg/dL — AB (ref 0.44–1.00)
Calcium, Ion: 1.19 mmol/L (ref 1.13–1.30)
Chloride: 95 mmol/L — ABNORMAL LOW (ref 101–111)
Glucose, Bld: 133 mg/dL — ABNORMAL HIGH (ref 65–99)
HEMATOCRIT: 38 % (ref 36.0–46.0)
Hemoglobin: 12.9 g/dL (ref 12.0–15.0)
POTASSIUM: 5.7 mmol/L — AB (ref 3.5–5.1)
Sodium: 127 mmol/L — ABNORMAL LOW (ref 135–145)
TCO2: 22 mmol/L (ref 0–100)

## 2015-08-07 LAB — CBG MONITORING, ED: GLUCOSE-CAPILLARY: 114 mg/dL — AB (ref 65–99)

## 2015-08-07 NOTE — ED Notes (Signed)
CHECKED CBG 114, RN CRICKET INFORMED

## 2015-08-07 NOTE — ED Notes (Signed)
Pt arrived via [facility] from home c/o AMS starting around noon today.  Pts granddaughter at bedside states yesterday pts temperature was low reading around 93 degrees then after being covered with a heating blanket temperature was 99.7 degrees.  Family states pt stopped ambulating and speaking around noon today.  Pt has hx of CHF and glaucoma.

## 2015-08-07 NOTE — ED Provider Notes (Signed)
CSN: [id number]     Arrival date & time 08/07/15  2207 History   First MD Initiated Contact with Patient 08/07/15 2216     Chief Complaint  Patient presents with  . Altered Mental Status     (Consider location/radiation/quality/duration/timing/severity/associated sxs/prior Treatment) HPI Comments: Patient with PMH of HTN, hypothyroid, adrenal insufficiency, CHF, and asthma presents to the ED with a chief complaint of AMS.  Patient is accompanied by her granddaughter, who states that the patient began acting withdrawn today.  Normally talks and walks.  Granddaughter states that around noon she quite talking and quite responding to people.  She was admitted for similar at [facility] about 2 months ago.  Was treated for adrenal insufficiency and given stress dose steroids.  She has been taking 50mg  hydrocortisone BID daily.  She has been compliant with the medications.  Granddaughter states that her weight is up.  She has some increased fluid on her legs despite being compliant with lasix.  No reported fevers, but granddaughter states that yesterday her temp was 93.  They put her under a warm blanket and temperature improved to 99.  The history is provided by the patient. No language interpreter was used.    Past Medical History  Diagnosis Date  . Hypertension   . Hypothyroidism   . HTN (hypertension)   . GERD (gastroesophageal reflux disease)   . Asthma   . Thyroid disease     not know in detail  . CHF (congestive heart failure) ([place])   . Glaucoma   . Adrenal insufficiency [facility])    Past Surgical History  Procedure Laterality Date  . Capsulotomy  08/23/2011    Procedure: MINOR CAPSULOTOMY;  Surgeon: Myrtha Mantis.;  Location: [place];  Service: Ophthalmology;  Laterality: Left;  YAG Capsulotomy Left Eye  . Esophagogastroduodenoscopy N/A 03/17/2013    Procedure: ESOPHAGOGASTRODUODENOSCOPY (EGD);  Surgeon: Irene Shipper, MD;  Location: [facility] ENDOSCOPY;  Service: Endoscopy;  Laterality: N/A;   . Left cataract extraction      2007  . Right cataract extraction      2006  . Appendectomy    . Back surgery     History reviewed. No pertinent family history. Social History  Substance Use Topics  . Smoking status: Never Smoker   . Smokeless tobacco: None  . Alcohol Use: No   OB History    Gravida Para Term Preterm AB TAB SAB Ectopic Multiple Living   0 0 0 0 0 0 0 0       Review of Systems  Unable to perform ROS: Mental status change      Allergies  Review of patient's allergies indicates no known allergies.  Home Medications   Prior to Admission medications   Medication Sig Start Date End Date Taking? Authorizing Provider  acetaminophen (TYLENOL) 325 MG tablet Take 325 mg by mouth every 6 (six) hours as needed for mild pain, moderate pain or headache.    Historical Provider, MD  amLODipine (NORVASC) 5 MG tablet Take 1 tablet (5 mg total) by mouth daily. 11/03/14   Eugenie Filler, MD  amoxicillin-clavulanate (AUGMENTIN) 500-125 MG per tablet Take 1 tablet (500 mg total) by mouth 2 (two) times daily. Patient not taking: Reported on 06/08/2015 02/18/15   Velvet Bathe, MD  aspirin EC 81 MG EC tablet Take 1 tablet (81 mg total) by mouth daily. 02/18/15   Velvet Bathe, MD  Azilsartan Medoxomil (EDARBI) 40 MG TABS Take 40 mg by mouth daily at  12 noon.    Historical Provider, MD  furosemide (LASIX) 20 MG tablet Take 1 tablet (20 mg total) by mouth daily. Foot swelling 02/18/15   Velvet Bathe, MD  hydrochlorothiazide (MICROZIDE) 12.5 MG capsule Take 12.5 mg by mouth daily.    Historical Provider, MD  isosorbide mononitrate (IMDUR) 30 MG 24 hr tablet Take 0.5 tablets (15 mg total) by mouth daily. Patient not taking: Reported on 06/08/2015 02/18/15   Velvet Bathe, MD  isosorbide mononitrate (IMDUR) 60 MG 24 hr tablet Take 60 mg by mouth daily at 12 noon. 06/07/15   Historical Provider, MD  lansoprazole (PREVACID) 30 MG capsule Take 30 mg by mouth daily.      Historical Provider, MD   latanoprost (XALATAN) 0.005 % ophthalmic solution Place 1 drop into the left eye at bedtime as needed (glaucoma).     Historical Provider, MD  levofloxacin (LEVAQUIN) 500 MG tablet Take 1 tablet (500 mg total) by mouth daily. Patient not taking: Reported on 06/08/2015 12/21/14   Jola Schmidt, MD  levothyroxine (SYNTHROID, LEVOTHROID) 50 MCG tablet Take 1 tablet (50 mcg total) by mouth daily before breakfast. 02/18/15   Velvet Bathe, MD  metoprolol succinate (TOPROL-XL) 50 MG 24 hr tablet Take 50 mg by mouth daily. 09/05/14   Historical Provider, MD  NASONEX 50 MCG/ACT nasal spray Place 2 sprays into both nostrils daily as needed (congestion, rhinitis).  05/12/15   Historical Provider, MD  PROAIR HFA 108 (90 BASE) MCG/ACT inhaler Inhale 2 puffs into the lungs 4 (four) times daily as needed. Shortness of breath 01/19/15   Historical Provider, MD  Vitamin D, Ergocalciferol, (DRISDOL) 50000 UNITS CAPS capsule Take 50,000 Units by mouth once a week. Sundays 05/11/15   Historical Provider, MD   Temp(Src) 98.6 F (37 C) (Axillary)  Ht 4\' 11"  (1.499 m)  Wt 55.792 kg  BMI 24.83 kg/m2  SpO2 97% Physical Exam  Constitutional: She is oriented to person, place, and time. She appears well-developed and well-nourished.  HENT:  Head: Normocephalic and atraumatic.  Eyes: Conjunctivae and EOM are normal. Pupils are equal, round, and reactive to light.  Neck: Normal range of motion. Neck supple.  Cardiovascular: Normal rate and regular rhythm.  Exam reveals no gallop and no friction rub.   No murmur heard. Pulmonary/Chest: Effort normal and breath sounds normal. No respiratory distress. She has no wheezes. She has no rales. She exhibits no tenderness.  Abdominal: Soft. Bowel sounds are normal. She exhibits no distension and no mass. There is no tenderness. There is no rebound and no guarding.  Musculoskeletal: Normal range of motion. She exhibits edema. She exhibits no tenderness.  Mild peripheral edema   Neurological: She is alert and oriented to person, place, and time.  Skin: Skin is warm and dry.  Psychiatric: She has a normal mood and affect. Her behavior is normal. Judgment and thought content normal.  Nursing note and vitals reviewed.   ED Course  Procedures (including critical care time) Results for orders placed or performed during the hospital encounter of 08/07/15  Comprehensive metabolic panel  Result Value Ref Range   Sodium 126 (L) 135 - 145 mmol/L   Potassium 5.9 (H) 3.5 - 5.1 mmol/L   Chloride 95 (L) 101 - 111 mmol/L   CO2 24 22 - 32 mmol/L   Glucose, Bld 136 (H) 65 - 99 mg/dL   BUN 34 (H) 6 - 20 mg/dL   Creatinine, Ser 1.92 (H) 0.44 - 1.00 mg/dL   Calcium 9.9 8.9 -  10.3 mg/dL   Total Protein 7.0 6.5 - 8.1 g/dL   Albumin 3.1 (L) 3.5 - 5.0 g/dL   AST 93 (H) 15 - 41 U/L   ALT 61 (H) 14 - 54 U/L   Alkaline Phosphatase 110 38 - 126 U/L   Total Bilirubin 0.7 0.3 - 1.2 mg/dL   GFR calc non Af Amer 25 (L) >60 mL/min   GFR calc Af Amer 29 (L) >60 mL/min   Anion gap 7 5 - 15  CBC with Differential/Platelet  Result Value Ref Range   WBC 3.4 (L) 4.0 - 10.5 K/uL   RBC 3.81 (L) 3.87 - 5.11 MIL/uL   Hemoglobin 11.4 (L) 12.0 - 15.0 g/dL   HCT 32.9 (L) 36.0 - 46.0 %   MCV 86.4 78.0 - 100.0 fL   MCH 29.9 26.0 - 34.0 pg   MCHC 34.7 30.0 - 36.0 g/dL   RDW 15.8 (H) 11.5 - 15.5 %   Platelets 246 150 - 400 K/uL   Neutrophils Relative % 69 %   Neutro Abs 2.3 1.7 - 7.7 K/uL   Lymphocytes Relative 27 %   Lymphs Abs 0.9 0.7 - 4.0 K/uL   Monocytes Relative 4 %   Monocytes Absolute 0.2 0.1 - 1.0 K/uL   Eosinophils Relative 0 %   Eosinophils Absolute 0.0 0.0 - 0.7 K/uL   Basophils Relative 0 %   Basophils Absolute 0.0 0.0 - 0.1 K/uL  Cortisol  Result Value Ref Range   Cortisol, Plasma 21.1 ug/dL  Brain natriuretic peptide  Result Value Ref Range   B Natriuretic Peptide 395.8 (H) 0.0 - 100.0 pg/mL  CBG monitoring, ED  Result Value Ref Range   Glucose-Capillary 114 (H) 65 -  99 mg/dL   Comment 1 Notify RN   I-Stat CG4 Lactic Acid, ED  Result Value Ref Range   Lactic Acid, Venous 1.41 0.5 - 2.0 mmol/L  I-stat troponin, ED  Result Value Ref Range   Troponin i, poc 0.02 0.00 - 0.08 ng/mL   Comment 3          I-stat chem 8, ed  Result Value Ref Range   Sodium 127 (L) 135 - 145 mmol/L   Potassium 5.7 (H) 3.5 - 5.1 mmol/L   Chloride 95 (L) 101 - 111 mmol/L   BUN 33 (H) 6 - 20 mg/dL   Creatinine, Ser 1.80 (H) 0.44 - 1.00 mg/dL   Glucose, Bld 133 (H) 65 - 99 mg/dL   Calcium, Ion 1.19 1.13 - 1.30 mmol/L   TCO2 22 0 - 100 mmol/L   Hemoglobin 12.9 12.0 - 15.0 g/dL   HCT 38.0 36.0 - 46.0 %   Ct Head Wo Contrast  08/07/2015  CLINICAL DATA:  Acute onset of altered mental status. Hypothermia. Initial encounter. EXAM: CT HEAD WITHOUT CONTRAST TECHNIQUE: Contiguous axial images were obtained from the base of the skull through the vertex without intravenous contrast. COMPARISON:  CT of the head performed 06/08/2015 FINDINGS: There is no evidence of acute infarction, mass lesion, or intra- or extra-axial hemorrhage on CT. Prominence of the ventricles and sulci reflects mild cortical volume loss. Mild periventricular white matter change likely reflects small vessel ischemic microangiopathy. A small chronic lacunar infarct is noted at the right basal ganglia. The brainstem and fourth ventricle are within normal limits. The cerebral hemispheres demonstrate grossly normal gray-white differentiation. No mass effect or midline shift is seen. There is no evidence of fracture; visualized osseous structures are unremarkable in appearance. The visualized  portions of the orbits are within normal limits. The paranasal sinuses and mastoid air cells are well-aerated. No significant soft tissue abnormalities are seen. IMPRESSION: 1. No acute intracranial pathology seen on CT. 2. Mild cortical volume loss and scattered small vessel ischemic microangiopathy. Small chronic lacunar infarct at the  right basal ganglia. Electronically Signed   By: Garald Balding M.D.   On: 08/07/2015 23:01   Dg Chest Port 1 View  08/07/2015  CLINICAL DATA:  Altered mental status.  No chest complaints. EXAM: PORTABLE CHEST 1 VIEW COMPARISON:  06/08/2015 FINDINGS: Mild cardiac enlargement with mild vascular congestion. No edema or consolidation. Mild atelectasis in the lung bases. Vague nodular opacity in the right mid lung measures 10 mm diameter. This likely corresponds to fluid in the minor fissure seen on previous CT chest. No blunting of costophrenic angles. No pneumothorax. Calcification of the aorta. IMPRESSION: Cardiac enlargement with mild vascular congestion. No edema or consolidation. Atelectasis in the lung bases. Possible fluid in the minor fissure on the right. Electronically Signed   By: Lucienne Capers M.D.   On: 08/07/2015 22:50    I have personally reviewed and evaluated these images and lab results as part of my medical decision-making.   EKG Interpretation   Date/Time:  Sunday August 07 2015 22:09:12 EST Ventricular Rate:  86 PR Interval:  188 QRS Duration: 83 QT Interval:  345 QTC Calculation: 413 R Axis:   27 Text Interpretation:  Sinus rhythm No significant change since last  tracing Confirmed by Maryan Rued  MD, Loree Fee ([id number]) on 08/07/2015 10:18:01  PM      MDM   Final diagnoses:  None   Patient with altered mental status. Onset today. Normally walks and talks, now she is doing neither. She does not speak [place]. She does not communicate with her family. CT head is negative for acute stroke. Chest x-ray remarkable for possible fluid in minor fissure on the right. BNP is 395 this is near baseline from comparing to prior labs. Potassium is 5.9, no peaked T waves. Creatinine is 1.92. This appears to be a acute on chronic renal insufficiency. Possibly from UTI. Urinalysis is still pending. Cortisols 21 and normal. Patient has been complying with taking her hydrocortisone at  home.    Patient seen by and discussed with Dr. Maryan Rued. Patient will need to be admitted. Urinalysis is pending. Patient signed out to [place], PA-C, who will follow up on urinalysis. If urinalysis is consistent with UTI, this would be a good source for her altered mental status and acute on chronic renal insufficiency. Otherwise, etiology is unclear and patient will require further workup in the hospital. Admission is indicated either way.     Montine Circle, PA-C 08/08/15 [id number]  Blanchie Dessert, MD 08/09/15 2228

## 2015-08-08 ENCOUNTER — Inpatient Hospital Stay (HOSPITAL_COMMUNITY): Payer: Medicare Other

## 2015-08-08 ENCOUNTER — Encounter (HOSPITAL_COMMUNITY): Payer: Self-pay | Admitting: Family Medicine

## 2015-08-08 DIAGNOSIS — E871 Hypo-osmolality and hyponatremia: Secondary | ICD-10-CM | POA: Diagnosis present

## 2015-08-08 DIAGNOSIS — Z79899 Other long term (current) drug therapy: Secondary | ICD-10-CM | POA: Diagnosis not present

## 2015-08-08 DIAGNOSIS — I10 Essential (primary) hypertension: Secondary | ICD-10-CM | POA: Diagnosis not present

## 2015-08-08 DIAGNOSIS — R680 Hypothermia, not associated with low environmental temperature: Secondary | ICD-10-CM | POA: Diagnosis present

## 2015-08-08 DIAGNOSIS — I5033 Acute on chronic diastolic (congestive) heart failure: Secondary | ICD-10-CM | POA: Diagnosis present

## 2015-08-08 DIAGNOSIS — E861 Hypovolemia: Secondary | ICD-10-CM | POA: Diagnosis present

## 2015-08-08 DIAGNOSIS — G9340 Encephalopathy, unspecified: Secondary | ICD-10-CM | POA: Diagnosis not present

## 2015-08-08 DIAGNOSIS — I169 Hypertensive crisis, unspecified: Secondary | ICD-10-CM | POA: Diagnosis present

## 2015-08-08 DIAGNOSIS — I5032 Chronic diastolic (congestive) heart failure: Secondary | ICD-10-CM

## 2015-08-08 DIAGNOSIS — K5900 Constipation, unspecified: Secondary | ICD-10-CM | POA: Diagnosis present

## 2015-08-08 DIAGNOSIS — E2749 Other adrenocortical insufficiency: Secondary | ICD-10-CM | POA: Diagnosis present

## 2015-08-08 DIAGNOSIS — E039 Hypothyroidism, unspecified: Secondary | ICD-10-CM | POA: Diagnosis present

## 2015-08-08 DIAGNOSIS — N179 Acute kidney failure, unspecified: Secondary | ICD-10-CM | POA: Diagnosis not present

## 2015-08-08 DIAGNOSIS — R4182 Altered mental status, unspecified: Secondary | ICD-10-CM | POA: Diagnosis present

## 2015-08-08 DIAGNOSIS — G9341 Metabolic encephalopathy: Secondary | ICD-10-CM | POA: Diagnosis present

## 2015-08-08 DIAGNOSIS — I130 Hypertensive heart and chronic kidney disease with heart failure and stage 1 through stage 4 chronic kidney disease, or unspecified chronic kidney disease: Secondary | ICD-10-CM | POA: Diagnosis present

## 2015-08-08 DIAGNOSIS — R740 Nonspecific elevation of levels of transaminase and lactic acid dehydrogenase [LDH]: Secondary | ICD-10-CM | POA: Diagnosis present

## 2015-08-08 DIAGNOSIS — N189 Chronic kidney disease, unspecified: Secondary | ICD-10-CM | POA: Diagnosis present

## 2015-08-08 DIAGNOSIS — R410 Disorientation, unspecified: Secondary | ICD-10-CM | POA: Diagnosis present

## 2015-08-08 DIAGNOSIS — T68XXXA Hypothermia, initial encounter: Secondary | ICD-10-CM

## 2015-08-08 DIAGNOSIS — E875 Hyperkalemia: Secondary | ICD-10-CM | POA: Diagnosis present

## 2015-08-08 DIAGNOSIS — R401 Stupor: Secondary | ICD-10-CM | POA: Diagnosis not present

## 2015-08-08 LAB — URINALYSIS, ROUTINE W REFLEX MICROSCOPIC
Glucose, UA: NEGATIVE mg/dL
HGB URINE DIPSTICK: NEGATIVE
Ketones, ur: 15 mg/dL — AB
Leukocytes, UA: NEGATIVE
NITRITE: NEGATIVE
PH: 5.5 (ref 5.0–8.0)
Protein, ur: 100 mg/dL — AB
SPECIFIC GRAVITY, URINE: 1.018 (ref 1.005–1.030)

## 2015-08-08 LAB — CORTISOL
CORTISOL PLASMA: 18.9 ug/dL
Cortisol, Plasma: 21.1 ug/dL

## 2015-08-08 LAB — COMPREHENSIVE METABOLIC PANEL
ALBUMIN: 3 g/dL — AB (ref 3.5–5.0)
ALT: 58 U/L — AB (ref 14–54)
AST: 90 U/L — AB (ref 15–41)
Alkaline Phosphatase: 104 U/L (ref 38–126)
Anion gap: 9 (ref 5–15)
BUN: 36 mg/dL — AB (ref 6–20)
CHLORIDE: 97 mmol/L — AB (ref 101–111)
CO2: 23 mmol/L (ref 22–32)
CREATININE: 1.82 mg/dL — AB (ref 0.44–1.00)
Calcium: 9.7 mg/dL (ref 8.9–10.3)
GFR calc Af Amer: 30 mL/min — ABNORMAL LOW (ref >60)
GFR, EST NON AFRICAN AMERICAN: 26 mL/min — AB (ref >60)
Glucose, Bld: 102 mg/dL — ABNORMAL HIGH (ref 65–99)
POTASSIUM: 5.4 mmol/L — AB (ref 3.5–5.1)
SODIUM: 129 mmol/L — AB (ref 135–145)
Total Bilirubin: 1.1 mg/dL (ref 0.3–1.2)
Total Protein: 6.9 g/dL (ref 6.5–8.1)

## 2015-08-08 LAB — CBC
HCT: 32.2 % — ABNORMAL LOW (ref 36.0–46.0)
Hemoglobin: 10.9 g/dL — ABNORMAL LOW (ref 12.0–15.0)
MCH: 29.5 pg (ref 26.0–34.0)
MCHC: 33.9 g/dL (ref 30.0–36.0)
MCV: 87 fL (ref 78.0–100.0)
PLATELETS: 243 10*3/uL (ref 150–400)
RBC: 3.7 MIL/uL — AB (ref 3.87–5.11)
RDW: 15.9 % — AB (ref 11.5–15.5)
WBC: 4.2 10*3/uL (ref 4.0–10.5)

## 2015-08-08 LAB — I-STAT CG4 LACTIC ACID, ED: Lactic Acid, Venous: 0.64 mmol/L (ref 0.5–2.0)

## 2015-08-08 LAB — URINE MICROSCOPIC-ADD ON

## 2015-08-08 LAB — AMMONIA: AMMONIA: 25 umol/L (ref 9–35)

## 2015-08-08 LAB — BRAIN NATRIURETIC PEPTIDE: B NATRIURETIC PEPTIDE 5: 395.8 pg/mL — AB (ref 0.0–100.0)

## 2015-08-08 LAB — TSH: TSH: 9.811 u[IU]/mL — AB (ref 0.350–4.500)

## 2015-08-08 LAB — T4, FREE: FREE T4: 1.27 ng/dL — AB (ref 0.61–1.12)

## 2015-08-08 MED ORDER — PANTOPRAZOLE SODIUM 20 MG PO TBEC
20.0000 mg | DELAYED_RELEASE_TABLET | Freq: Every day | ORAL | Status: DC
  Administered 2015-08-09 – 2015-08-13 (×5): 20 mg via ORAL
  Filled 2015-08-08 (×5): qty 1

## 2015-08-08 MED ORDER — SODIUM CHLORIDE 0.9 % IV BOLUS (SEPSIS)
1000.0000 mL | Freq: Once | INTRAVENOUS | Status: AC
  Administered 2015-08-08: 1000 mL via INTRAVENOUS

## 2015-08-08 MED ORDER — AMLODIPINE BESYLATE 5 MG PO TABS
5.0000 mg | ORAL_TABLET | Freq: Every day | ORAL | Status: DC
  Administered 2015-08-09 – 2015-08-13 (×5): 5 mg via ORAL
  Filled 2015-08-08 (×5): qty 1

## 2015-08-08 MED ORDER — HYDROCORTISONE 20 MG PO TABS: 20.0000 mg | ORAL_TABLET | Freq: Every day | ORAL | Status: DC

## 2015-08-08 MED ORDER — LEVOTHYROXINE SODIUM 50 MCG PO TABS
50.0000 ug | ORAL_TABLET | Freq: Every day | ORAL | Status: DC
  Administered 2015-08-08 – 2015-08-13 (×6): 50 ug via ORAL
  Filled 2015-08-08 (×6): qty 1

## 2015-08-08 MED ORDER — SODIUM CHLORIDE 0.9 % IJ SOLN
3.0000 mL | Freq: Two times a day (BID) | INTRAMUSCULAR | Status: DC
  Administered 2015-08-08 – 2015-08-13 (×10): 3 mL via INTRAVENOUS

## 2015-08-08 MED ORDER — LATANOPROST 0.005 % OP SOLN
1.0000 [drp] | Freq: Every day | OPHTHALMIC | Status: DC
  Administered 2015-08-08 – 2015-08-13 (×5): 1 [drp] via OPHTHALMIC
  Filled 2015-08-08 (×2): qty 2.5

## 2015-08-08 MED ORDER — HYDROCORTISONE 10 MG PO TABS: 10.0000 mg | ORAL_TABLET | Freq: Every day | ORAL | Status: DC

## 2015-08-08 MED ORDER — SODIUM POLYSTYRENE SULFONATE 15 GM/60ML PO SUSP
30.0000 g | Freq: Once | ORAL | Status: DC
  Filled 2015-08-08 (×2): qty 120

## 2015-08-08 MED ORDER — SODIUM POLYSTYRENE SULFONATE 15 GM/60ML PO SUSP
30.0000 g | Freq: Once | ORAL | Status: DC
  Filled 2015-08-08: qty 120

## 2015-08-08 MED ORDER — ACETAMINOPHEN 325 MG PO TABS: 325.0000 mg | ORAL_TABLET | Freq: Four times a day (QID) | ORAL | Status: DC | PRN

## 2015-08-08 MED ORDER — HEPARIN SODIUM (PORCINE) 5000 UNIT/ML IJ SOLN
5000.0000 [IU] | Freq: Three times a day (TID) | INTRAMUSCULAR | Status: DC
  Administered 2015-08-08 – 2015-08-13 (×15): 5000 [IU] via SUBCUTANEOUS
  Filled 2015-08-08 (×11): qty 1

## 2015-08-08 MED ORDER — METOPROLOL SUCCINATE ER 50 MG PO TB24
50.0000 mg | ORAL_TABLET | Freq: Every day | ORAL | Status: DC
  Administered 2015-08-09 – 2015-08-13 (×5): 50 mg via ORAL
  Filled 2015-08-08 (×5): qty 1

## 2015-08-08 MED ORDER — SODIUM CHLORIDE 0.9 % IV SOLN
INTRAVENOUS | Status: DC
Start: 2015-08-08 — End: 2015-08-10
  Administered 2015-08-08 (×2): via INTRAVENOUS

## 2015-08-08 MED ORDER — ISOSORBIDE MONONITRATE ER 30 MG PO TB24
30.0000 mg | ORAL_TABLET | Freq: Every day | ORAL | Status: DC
  Administered 2015-08-08 – 2015-08-13 (×6): 30 mg via ORAL
  Filled 2015-08-08 (×6): qty 1

## 2015-08-08 MED ORDER — HYDROCORTISONE NA SUCCINATE PF 100 MG IJ SOLR
50.0000 mg | Freq: Three times a day (TID) | INTRAMUSCULAR | Status: DC
  Administered 2015-08-08 – 2015-08-09 (×4): 50 mg via INTRAVENOUS
  Filled 2015-08-08 (×4): qty 2

## 2015-08-08 MED ORDER — HYDRALAZINE HCL 20 MG/ML IJ SOLN
10.0000 mg | Freq: Four times a day (QID) | INTRAMUSCULAR | Status: DC | PRN
  Administered 2015-08-08: 10 mg via INTRAVENOUS
  Filled 2015-08-08: qty 1

## 2015-08-08 NOTE — Progress Notes (Signed)
BP 199/72, HR 63. Pt nonverbal this am, unable to take/swallow medications. Dr. Karleen Hampshire notified.

## 2015-08-08 NOTE — Evaluation (Signed)
Clinical/Bedside Swallow Evaluation Patient Details  Name: Katie Mosley MRN: [id number] Date of Birth: [birth year]  Today's Date: 08/08/2015 Time: SLP Start Time (ACUTE ONLY): [id number] SLP Stop Time (ACUTE ONLY): 1615 SLP Time Calculation (min) (ACUTE ONLY): 12 min  Past Medical History:  Past Medical History  Diagnosis Date  . Hypertension   . Hypothyroidism   . HTN (hypertension)   . GERD (gastroesophageal reflux disease)   . Asthma   . Thyroid disease     not know in detail  . CHF (congestive heart failure) ([place])   . Glaucoma   . Adrenal insufficiency ([place])   . Adrenal insufficiency ([place])     Dx'd [facility] 05/2015   Past Surgical History:  Past Surgical History  Procedure Laterality Date  . Capsulotomy  08/23/2011    Procedure: MINOR CAPSULOTOMY;  Surgeon: Myrtha Mantis.;  Location: [place];  Service: Ophthalmology;  Laterality: Left;  YAG Capsulotomy Left Eye  . Esophagogastroduodenoscopy N/A 03/17/2013    Procedure: ESOPHAGOGASTRODUODENOSCOPY (EGD);  Surgeon: Irene Shipper, MD;  Location: [facility] ENDOSCOPY;  Service: Endoscopy;  Laterality: N/A;  . Left cataract extraction      2007  . Right cataract extraction      2006  . Appendectomy    . Back surgery     HPI:  Katie Mosley is a 74 y.o. female with a past medical history significant for HTN, hypothyroidism, chronic diastolic CHF and recently diagnosed adrenal insufficiency who presents with obtundation again.   Assessment / Plan / Recommendation Clinical Impression  Bedside swallow evaluation complete.  Patient lethargic, upright in bed with minimal participation in the oal motor exam.  When SLP provided tactile cues for opening mouth patient began actively resisting.  She was also non-accepting of oral care or PO trials.  As a result, recommend NPO with med via alternative means at this time.  SLP to follow acutely for acceptance and least restrictive PO intake; care at next level TBD.     Aspiration Risk  high    Diet Recommendation NPO   Medication Administration: Via alternative means    Other  Recommendations Oral Care Recommendations: Oral care QID Other Recommendations: Have oral suction available   Follow up Recommendations  Other (comment) (TBD)    Frequency and Duration min 3x week  1 week       Prognosis Prognosis for Safe Diet Advancement: Fair Barriers to Reach Goals: Severity of deficits      Swallow Study   General HPI: Katie Mosley is a 74 y.o. female with a past medical history significant for HTN, hypothyroidism, chronic diastolic CHF and recently diagnosed adrenal insufficiency who presents with obtundation again. Type of Study: Bedside Swallow Evaluation Previous Swallow Assessment: 6/16 Sc[employer]x  Diet Prior to this Study: Regular;Thin liquids Temperature Spikes Noted: No Respiratory Status: Room air History of Recent Intubation: No Behavior/Cognition: Lethargic/Drowsy Oral Cavity Assessment:  (unable to complete) Self-Feeding Abilities: Total assist Patient Positioning: Upright in bed Baseline Vocal Quality: Aphonic Volitional Cough: Cognitively unable to elicit Volitional Swallow: Unable to elicit    Oral/Motor/Sensory Function Overall Oral Motor/Sensory Function:  (unable to complete)   Ice Chips Other Comments: did not accept bolus   Thin Liquid Other Comments: did not accept bolus    Nectar Thick Nectar Thick Liquid: Not tested   Honey Thick Honey Thick Liquid: Not tested   Puree Other Comments: did not accept bolus   Solid Solid: Not tested      Katie Mosley,  M.A., CCC-SLP [id number]  Katie Mosley 08/08/2015,4:40 PM

## 2015-08-08 NOTE — H&P (Signed)
History and Physical  Patient Name: Katie Mosley     [id number]    DOB: [birth year]    DOA: 08/07/2015 Referring physician: Blanchie Dessert, MD PCP: Philis Fendt, MD      Chief Complaint: Cy Blamer  HPI: Katie Mosley is a 74 y.o. female with a past medical history significant for HTN, hypothyroidism, chronic diastolic CHF and recently diagnosed 2ary adrenal insufficiency who presents with obtundation again.  Two months ago, the patient was clinically well when she developed obtundation rapidly over a day or so.  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  During that hospitalization, evidently the patient came back to her normal state of consciousness within about two days with fluids and stress dose steroids, and was diagnosed with what appeared to be secondary adrenal insufficiency with Cosyntropin stim test.  She was discharged on Cortef tapered down to 20 mg in the morning and 10 mg at night, which she has eben taking without event for two months.  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  Evidently, the patient has now again, out of the blue, developed increasing weakness and dyspnea over 2-3 days and then become obtunded today.      In the ED, she had a rectal temperature of 32F which rose with warming blanket and then stayed normal.  She was hypertensive and obtunded.  The glucose was normal.  There were no missed doses of medicines, and no focal neurological signs.  A noncontrasted CT head was unremarkable.  The patient had a new AKI, with  serum creatinine 1.9 mg/dL from 0.9 mg/dL previously.  The sodium was back down to 126 mmol/L from normal at discharge from [facility].  The potassium was elevated at 5.9 mmol/L, and there was even a mild transaminitis.  The case was discussed with endocrinology faculty, Dr. Quay Burow, at [facility], who recommended collection of ACTH and cortisol, TSH/T4, and aldosterone and PRA before administering Solu-cortef 50 mg IV q8hrs.       Review of Systems:  Unable to obtain due to patient mentation.  Granddaughter notes patient complaining of weakness, dyspnea, poor sleep and then abrupt change in mental status.  No Known Allergies  Prior to Admission medications   Medication Sig Start Date End Date Taking? Authorizing Provider  acetaminophen (TYLENOL) 325 MG tablet Take 325 mg by mouth every 6 (six) hours as needed for mild pain, moderate pain or headache.   Yes Historical Provider, MD  amLODipine (NORVASC) 5 MG tablet Take 1 tablet (5 mg total) by mouth daily. 11/03/14  Yes Eugenie Filler, MD  furosemide (LASIX) 20 MG tablet Take 1 tablet (20 mg total) by mouth daily. Foot swelling Patient taking differently: Take 20 mg by mouth daily as needed for fluid. Foot swelling 02/18/15  Yes Velvet Bathe, MD  hydrocortisone (CORTEF) 5 MG tablet Take 10-20 mg by mouth 2 (two) times daily. 20mg  in the morning and 10mg  in the evening.   Yes Historical Provider, MD  isosorbide mononitrate (IMDUR) 60 MG 24 hr tablet Take 30 mg by mouth daily at 12 noon.  06/07/15  Yes Historical  Provider, MD  lansoprazole (PREVACID) 30 MG capsule Take 30 mg by mouth daily as needed (gas).    Yes Historical Provider, MD  latanoprost (XALATAN) 0.005 % ophthalmic solution Place 1 drop into the left eye at bedtime as needed (glaucoma).    Yes Historical Provider, MD  levothyroxine (SYNTHROID, LEVOTHROID) 50 MCG tablet Take 1 tablet (50 mcg total) by mouth daily before breakfast. 02/18/15  Yes Velvet Bathe, MD  metoprolol succinate (TOPROL-XL) 50 MG  24 hr tablet Take 50 mg by mouth daily. 09/05/14  Yes Historical Provider, MD  NASONEX 50 MCG/ACT nasal spray Place 2 sprays into both nostrils daily as needed (congestion, rhinitis).  05/12/15  Yes Historical Provider, MD  PROAIR HFA 108 (90 BASE) MCG/ACT inhaler Inhale 2 puffs into the lungs 4 (four) times daily as needed for shortness of breath. Shortness of breath 01/19/15  Yes Historical Provider, MD  Vitamin D, Ergocalciferol, (DRISDOL) 50000 UNITS CAPS capsule Take 50,000 Units by mouth once a week. saturday 05/11/15  Yes Historical Provider, MD    Past Medical History  Diagnosis Date  . Hypertension   . Hypothyroidism   . HTN (hypertension)   . GERD (gastroesophageal reflux disease)   . Asthma   . Thyroid disease     not know in detail  . CHF (congestive heart failure) ([place])   . Glaucoma   . Adrenal insufficiency ([place])   . Adrenal insufficiency ([place])     Dx'd [facility] 05/2015    Past Surgical History  Procedure Laterality Date  . Capsulotomy  08/23/2011    Procedure: MINOR CAPSULOTOMY;  Surgeon: Myrtha Mantis.;  Location: [facility];  Service: Ophthalmology;  Laterality: Left;  YAG Capsulotomy Left Eye  . Esophagogastroduodenoscopy N/A 03/17/2013    Procedure: ESOPHAGOGASTRODUODENOSCOPY (EGD);  Surgeon: Irene Shipper, MD;  Location[facility] ENDOSCOPY;  Service: Endoscopy;  Laterality: N/A;  . Left cataract extraction      2007  . Right cataract extraction      2006  . Appendectomy    . Back surgery      Family history: family history is not on file.  The patient is not able to provide.  Social History: Patient lives with family.  She does not smoke.  She is independent with all ADLs at baseline and ambulates without assistive device.       Physical Exam: BP 167/68 mmHg  Pulse 77  Temp(Src) 99.1 F (37.3 C) (Rectal)  Resp 17  Ht 4\' 11"  (1.499 m)  Wt 55.792 kg (123 lb)  BMI 24.83 kg/m2  SpO2 100% General appearance: Well-developed, adult female, sitting straight up in  bed, catatonic.   Eyes: Anicteric, the pupils of both eyes are deformed.     ENT: No nasal deformity, discharge, or epistaxis.  OP tacky without lesions visible, patient does not open mouth.   Skin: Warm and dry.  No abnormal pigmentation noted. Cardiac: RRR, nl S1-S2, no murmurs appreciated.   Respiratory: Normal respiratory rate and rhythm.  CTAB without rales or wheezes. Abdomen: Abdomen soft without rigidity.  No TTP. No ascites, distension.   MSK: No deformities or effusions. Neuro: Catatonic, does not follow commands.  Occasionally turns head to name.  Symmetric movements, no facial asymmetry. Psych: Unable to assess.       Labs on Admission:  The metabolic panel shows sodium 126, potassium 5.9, creatinine 1.92. AST and ALT 93 and 61, minimally elevated. BNP normal Lactic acid level normal Initial random cortisol at admission 21 TNI  negative The complete blood count shows mild leukopenia, no anemia, normal platelets.   Radiological Exams on Admission: Personally reviewed: Ct Head Wo Contrast  08/07/2015  CLINICAL DATA:  Acute onset of altered mental status. Hypothermia. Initial encounter. EXAM: CT HEAD WITHOUT CONTRAST TECHNIQUE: Contiguous axial images were obtained from the base of the skull through the vertex without intravenous contrast. COMPARISON:  CT of the head performed 06/08/2015 FINDINGS: There is no evidence of acute infarction, mass lesion, or intra- or extra-axial hemorrhage on CT. Prominence of the ventricles and sulci reflects mild cortical volume loss. Mild periventricular white matter change likely reflects small vessel ischemic microangiopathy. A small chronic lacunar infarct is noted at the right basal ganglia. The brainstem and fourth ventricle are within normal limits. The cerebral hemispheres demonstrate grossly normal gray-white differentiation. No mass effect or midline shift is seen. There is no evidence of fracture; visualized osseous structures are  unremarkable in appearance. The visualized portions of the orbits are within normal limits. The paranasal sinuses and mastoid air cells are well-aerated. No significant soft tissue abnormalities are seen. IMPRESSION: 1. No acute intracranial pathology seen on CT. 2. Mild cortical volume loss and scattered small vessel ischemic microangiopathy. Small chronic lacunar infarct at the right basal ganglia. Electronically Signed   By: Garald Balding M.D.   On: 08/07/2015 23:01   Dg Chest Port 1 View  08/07/2015  CLINICAL DATA:  Altered mental status.  No chest complaints. EXAM: PORTABLE CHEST 1 VIEW COMPARISON:  06/08/2015 FINDINGS: Mild cardiac enlargement with mild vascular congestion. No edema or consolidation. Mild atelectasis in the lung bases. Vague nodular opacity in the right mid lung measures 10 mm diameter. This likely corresponds to fluid in the minor fissure seen on previous CT chest. No blunting of costophrenic angles. No pneumothorax. Calcification of the aorta. IMPRESSION: Cardiac enlargement with mild vascular congestion. No edema or consolidation. Atelectasis in the lung bases. Possible fluid in the minor fissure on the right. Electronically Signed   By: Lucienne Capers M.D.   On: 08/07/2015 22:50    EKG: Independently reviewed. No peaked T waves.  NSR rate 86    Assessment/Plan 1. Obtundation, hypothermia:  This is new.  A unifying diagnosis is not clear.  This episodic obtundation is not characteristic of adrenal insufficiency, nor the time course consistent with thyroid disease.  The finding of a vascular abnormality in the sella on MRA at [facility] raises the possibility that the patient has some vascular disease affected the hypothalamus.  This was discussed with Neurology on call overnight who doubted this small finding on MRA would be responsible for patient's symptoms, but that CTA would better image the vessel in question.     -Correct electrolytes as below -ACTH, cortisol, TSH/T4,  aldosterone, and PRA before stress dose steroids -Solucortef 50 q8 -CTA head and neck when renal function improves.  If abnormal, consultation with Neuro and/or Neurosurgery    2. Hyperkalemia:  This is new.   -Kayexalate first dose given -Trend BMP  3. Hyponatremia:  Hypovolemic on exam.   -Will administer fluids and stress steroids, and then re-evaluate -Trend BMP  4. AKI:  Likely pre-renal.  Urinalysis bland.  Defer urine lytes because of home lasix. -Hold furosemide -Fluid resuscitation and trend BMP  5. Chronic diastolic CHF:  Stable. Clinically hypovolemic at present -Hold furosemide -Continue metoprolol and amlodipine and Imdur  6. HTN:  Stable. Hypertensive at admission. -Continue metoprolol and amlodipine and Imdur -Check aldosterone/PRA  7. Transaminitis: Unclear etiology.   -  Trend LFTs    DVT PPx: Heparin Diet: Regular Consultants: Spoke with Dr. Quay Burow from [place], who will follow along by CareEverywhere as able, and welcomes phone contact if necessary Code Status: Full Family Communication: Granddaughter at bedside  Medical decision making: What exists of the patient's previous chart and CareEverywhere was reviewed in depth and the case was discussed with Dr. Roxanne Mins from [facility] ED and Dr. Quay Burow from [place]. Patient seen 2:32 AM on 08/08/2015.  Disposition Plan:  Admit for treatment of sodium, potassium, and AKI.  Once corrected, re-imaging of sella turcica if able.  Stress dose steroids and taper.      Edwin Dada [facility] Pager [phone]

## 2015-08-08 NOTE — ED Notes (Signed)
RN Vicente Males assisted with In Out Cath.

## 2015-08-08 NOTE — ED Notes (Signed)
Katie Mosley received call at [facility] pal line) paged @ [phone].

## 2015-08-08 NOTE — Significant Event (Signed)
Called and spoke with MRI who stated they will be able to do patient scan after 7pm today. Technician stated will send for transport when time. Delores Edelstein, Therapist, sports.

## 2015-08-08 NOTE — ED Provider Notes (Signed)
Anticipate admission  AMS -   Today less responsive, not walking/talking Recent admission [facility]) for adrenal insufficiency - started 50 mg hydrocortisone BID, normal cortisol today  CT, CXR - ok BMET at baseline UA pending  UA negative for infection. Re-evaluation shows that the patient continues to be non-verbal. She is awake, alert, does not respond verbally to family member. She will need admission as per plan of Dr. Maryan Rued and [place] for AMS/delirium. Discussed with Dr. Loleta Books who accepts for admission.  Charlann Lange, PA-C 08/08/15 [id number]  Blanchie Dessert, MD 08/09/15 2228

## 2015-08-08 NOTE — Progress Notes (Signed)
Utilization review completed. Jonica Bickhart, RN, BSN. 

## 2015-08-08 NOTE — Progress Notes (Signed)
[facility] PROGRESS NOTE  TIMIKO GAU [id number] DOB: [birth year] DOA: 08/07/2015 PCP: Philis Fendt, MD  Assessment/Plan: 1. Altered mental status/ encephalopathic: - unclear etiology.  - Metabolic in nature?  - adrenal insufficiency.  - started the patient on solu cortef 50 mg IV every 8 hours.  - her TSH and free t4 are elevated.  - speech and swallow eval. - initial CT head NEG, ordered MRI brain without contrast.   2. Hypertensive crisis: She is not taking any po meds at this time.  - started on IV hydralazine.    3. Adrenal insufficiency, would explain the electrolyte abnormalities.  On solu cortef. Monitor electrolytes.   4.chronic diastolic CHF.  Appears t obe compensated.      Code Status: full code.  Family Communication: family at bedside Disposition Plan: pending further work up.   Consultants:  none  Procedures:  none  Antibiotics:  none  HPI/Subjective: Sitting on the bed, non verbal,  responding to verbal cues  Objective: Filed Vitals:   08/08/15 0814 08/08/15 1031  BP: 199/72 143/50  Pulse: 63   Temp: 98 F (36.7 C)   Resp: 16     Intake/Output Summary (Last 24 hours) at 08/08/15 1323 Last data filed at 08/08/15 [id number]  Gross per 24 hour  Intake      0 ml  Output      0 ml  Net      0 ml   Filed Weights   08/07/15 2211 08/08/15 0457  Weight: 55.792 kg (123 lb) 52.617 kg (116 lb)    Exam:   General:  Alert afebrile comfortable  Cardiovascular: s1s2  Respiratory: clear to auscultaton, no wheezing or rhonchi  Abdomen: soft non tender non distended bowel sounds heard.   Musculoskeletal: no pedal edema.   Data Reviewed: Basic Metabolic Panel:  Recent Labs Lab 08/07/15 2315 08/07/15 2329 08/08/15 0523  NA 126* 127* 129*  K 5.9* 5.7* 5.4*  CL 95* 95* 97*  CO2 24  --  23  GLUCOSE 136* 133* 102*  BUN 34* 33* 36*  CREATININE 1.92* 1.80* 1.82*  CALCIUM 9.9  --  9.7   Liver Function  Tests:  Recent Labs Lab 08/07/15 2315 08/08/15 0523  AST 93* 90*  ALT 61* 58*  ALKPHOS 110 104  BILITOT 0.7 1.1  PROT 7.0 6.9  ALBUMIN 3.1* 3.0*   No results for input(s): LIPASE, AMYLASE in the last 168 hours.  Recent Labs Lab 08/08/15 0403  AMMONIA 25   CBC:  Recent Labs Lab 08/07/15 2315 08/07/15 2329 08/08/15 0523  WBC 3.4*  --  4.2  NEUTROABS 2.3  --   --   HGB 11.4* 12.9 10.9*  HCT 32.9* 38.0 32.2*  MCV 86.4  --  87.0  PLT 246  --  243   Cardiac Enzymes: No results for input(s): CKTOTAL, CKMB, CKMBINDEX, TROPONINI in the last 168 hours. BNP (last 3 results)  Recent Labs  02/13/15 0115 06/08/15 1724 08/07/15 2315  BNP 472.1* 375.5* 395.8*    ProBNP (last 3 results) No results for input(s): PROBNP in the last 8760 hours.  CBG:  Recent Labs Lab 08/07/15 2332  GLUCAP 114*    No results found for this or any previous visit (from the past 240 hour(s)).   Studies: Ct Head Wo Contrast  08/07/2015  CLINICAL DATA:  Acute onset of altered mental status. Hypothermia. Initial encounter. EXAM: CT HEAD WITHOUT CONTRAST TECHNIQUE: Contiguous axial images were obtained from the base of the  skull through the vertex without intravenous contrast. COMPARISON:  CT of the head performed 06/08/2015 FINDINGS: There is no evidence of acute infarction, mass lesion, or intra- or extra-axial hemorrhage on CT. Prominence of the ventricles and sulci reflects mild cortical volume loss. Mild periventricular white matter change likely reflects small vessel ischemic microangiopathy. A small chronic lacunar infarct is noted at the right basal ganglia. The brainstem and fourth ventricle are within normal limits. The cerebral hemispheres demonstrate grossly normal gray-white differentiation. No mass effect or midline shift is seen. There is no evidence of fracture; visualized osseous structures are unremarkable in appearance. The visualized portions of the orbits are within normal  limits. The paranasal sinuses and mastoid air cells are well-aerated. No significant soft tissue abnormalities are seen. IMPRESSION: 1. No acute intracranial pathology seen on CT. 2. Mild cortical volume loss and scattered small vessel ischemic microangiopathy. Small chronic lacunar infarct at the right basal ganglia. Electronically Signed   By: Garald Balding M.D.   On: 08/07/2015 23:01   Dg Chest Port 1 View  08/07/2015  CLINICAL DATA:  Altered mental status.  No chest complaints. EXAM: PORTABLE CHEST 1 VIEW COMPARISON:  06/08/2015 FINDINGS: Mild cardiac enlargement with mild vascular congestion. No edema or consolidation. Mild atelectasis in the lung bases. Vague nodular opacity in the right mid lung measures 10 mm diameter. This likely corresponds to fluid in the minor fissure seen on previous CT chest. No blunting of costophrenic angles. No pneumothorax. Calcification of the aorta. IMPRESSION: Cardiac enlargement with mild vascular congestion. No edema or consolidation. Atelectasis in the lung bases. Possible fluid in the minor fissure on the right. Electronically Signed   By: Lucienne Capers M.D.   On: 08/07/2015 22:50    Scheduled Meds: . amLODipine  5 mg Oral Daily  . heparin  5,000 Units Subcutaneous 3 times per day  . hydrocortisone sod succinate (SOLU-CORTEF) inj  50 mg Intravenous Q8H  . isosorbide mononitrate  30 mg Oral Q1200  . latanoprost  1 drop Left Eye QHS  . levothyroxine  50 mcg Oral QAC breakfast  . metoprolol succinate  50 mg Oral Daily  . pantoprazole  20 mg Oral Daily  . sodium chloride  3 mL Intravenous Q12H  . sodium polystyrene  30 g Oral Once  . sodium polystyrene  30 g Oral Once   Continuous Infusions: . sodium chloride 100 mL/hr at 08/08/15 [id number]    Principal Problem:   Altered mental status Active Problems:   Hyponatremia   Benign essential HTN   Hypothyroidism   Congestive heart disease (HCC)   Hypothermia   Secondary adrenal insufficiency (HCC)   AKI  (acute kidney injury) ([place])    Time spent: 25 minutes.     [facility] Pager [phone] . If 7PM-7AM, please contact night-coverage at [email or web address], password [facility] 08/08/2015, 1:23 PM  LOS: 0 days

## 2015-08-09 LAB — BASIC METABOLIC PANEL
Anion gap: 11 (ref 5–15)
BUN: 28 mg/dL — AB (ref 6–20)
CALCIUM: 9.2 mg/dL (ref 8.9–10.3)
CHLORIDE: 106 mmol/L (ref 101–111)
CO2: 18 mmol/L — AB (ref 22–32)
CREATININE: 1.23 mg/dL — AB (ref 0.44–1.00)
GFR calc non Af Amer: 42 mL/min — ABNORMAL LOW (ref >60)
GFR, EST AFRICAN AMERICAN: 49 mL/min — AB (ref >60)
Glucose, Bld: 125 mg/dL — ABNORMAL HIGH (ref 65–99)
Potassium: 4.6 mmol/L (ref 3.5–5.1)
Sodium: 135 mmol/L (ref 135–145)

## 2015-08-09 LAB — CBC
HEMATOCRIT: 29.4 % — AB (ref 36.0–46.0)
HEMOGLOBIN: 10 g/dL — AB (ref 12.0–15.0)
MCH: 29.7 pg (ref 26.0–34.0)
MCHC: 34 g/dL (ref 30.0–36.0)
MCV: 87.2 fL (ref 78.0–100.0)
Platelets: 216 10*3/uL (ref 150–400)
RBC: 3.37 MIL/uL — ABNORMAL LOW (ref 3.87–5.11)
RDW: 16.3 % — AB (ref 11.5–15.5)
WBC: 4.1 10*3/uL (ref 4.0–10.5)

## 2015-08-09 MED ORDER — HYDROCORTISONE NA SUCCINATE PF 100 MG IJ SOLR
50.0000 mg | Freq: Two times a day (BID) | INTRAMUSCULAR | Status: DC
  Administered 2015-08-09 – 2015-08-11 (×4): 50 mg via INTRAVENOUS
  Filled 2015-08-09 (×4): qty 2

## 2015-08-09 NOTE — Progress Notes (Signed)
[facility] PROGRESS NOTE  Katie Mosley [id number] DOB: 09-Aug-1941 DOA: 08/07/2015 PCP: Philis Fendt, MD Brief history: Katie Mosley is a 74 y.o. female with a past medical history significant for HTN, hypothyroidism, chronic diastolic CHF and recently diagnosed secondary adrenal insufficiency  presents with obtundation again. Assessment/Plan: 1. Altered mental status/ encephalopathic: - unclear etiology.  - Metabolic in nature?  - adrenal insufficiency.  - started the patient on solu cortef 50 mg IV every 8 hours, electrolytes have improved and we will start tapering the steroids.  - her TSH and free t4 are elevated.  - speech and swallow eval consulted and recommendations given.  - initial CT head NEG, followed up with an MRI BRAIN which does not show any acute stroke.   2. Hypertensive crisis: - since she started eating, resume all oral meds.  - started on IV hydralazine.  - decrease the rate of IV fluids.    3. Adrenal insufficiency, would explain the electrolyte abnormalities.  On solu cortef. Electrolytes improving, start tapering the solu cortef.   4.chronic diastolic CHF.  Appears to be compensated.   5. Acute kidney injury: Improving with fluids. Holding lasix.      Code Status: full code.  Family Communication: family at bedside Disposition Plan: pending further work up.   Consultants:  none  Procedures:  none  Antibiotics:  none  HPI/Subjective: Is more alert and communicative today. Eating today Objective: Filed Vitals:   08/09/15 0410 08/09/15 0946  BP: 177/78 180/72  Pulse: 90 87  Temp: 98.5 F (36.9 C) 99.2 F (37.3 C)  Resp: 19 20    Intake/Output Summary (Last 24 hours) at 08/09/15 1147 Last data filed at 08/09/15 0947  Gross per 24 hour  Intake 1756.67 ml  Output      0 ml  Net 1756.67 ml   Filed Weights   08/07/15 2211 08/08/15 0457  Weight: 55.792 kg (123 lb) 52.617 kg (116 lb)     Exam:   General:  Alert afebrile comfortable  Cardiovascular: s1s2  Respiratory: clear to auscultaton, no wheezing or rhonchi  Abdomen: soft non tender non distended bowel sounds heard.   Musculoskeletal: no pedal edema.   Data Reviewed: Basic Metabolic Panel:  Recent Labs Lab 08/07/15 2315 08/07/15 2329 08/08/15 0523 08/09/15 0545  NA 126* 127* 129* 135  K 5.9* 5.7* 5.4* 4.6  CL 95* 95* 97* 106  CO2 24  --  23 18*  GLUCOSE 136* 133* 102* 125*  BUN 34* 33* 36* 28*  CREATININE 1.92* 1.80* 1.82* 1.23*  CALCIUM 9.9  --  9.7 9.2   Liver Function Tests:  Recent Labs Lab 08/07/15 2315 08/08/15 0523  AST 93* 90*  ALT 61* 58*  ALKPHOS 110 104  BILITOT 0.7 1.1  PROT 7.0 6.9  ALBUMIN 3.1* 3.0*   No results for input(s): LIPASE, AMYLASE in the last 168 hours.  Recent Labs Lab 08/08/15 0403  AMMONIA 25   CBC:  Recent Labs Lab 08/07/15 2315 08/07/15 2329 08/08/15 0523 08/09/15 0545  WBC 3.4*  --  4.2 4.1  NEUTROABS 2.3  --   --   --   HGB 11.4* 12.9 10.9* 10.0*  HCT 32.9* 38.0 32.2* 29.4*  MCV 86.4  --  87.0 87.2  PLT 246  --  243 216   Cardiac Enzymes: No results for input(s): CKTOTAL, CKMB, CKMBINDEX, TROPONINI in the last 168 hours. BNP (last 3 results)  Recent Labs  02/13/15 0115 06/08/15 1724 08/07/15 2315  BNP 472.1* 375.5* 395.8*    ProBNP (last 3 results) No results for input(s): PROBNP in the last 8760 hours.  CBG:  Recent Labs Lab 08/07/15 2332  GLUCAP 114*    No results found for this or any previous visit (from the past 240 hour(s)).   Studies: Ct Head Wo Contrast  08/07/2015  CLINICAL DATA:  Acute onset of altered mental status. Hypothermia. Initial encounter. EXAM: CT HEAD WITHOUT CONTRAST TECHNIQUE: Contiguous axial images were obtained from the base of the skull through the vertex without intravenous contrast. COMPARISON:  CT of the head performed 06/08/2015 FINDINGS: There is no evidence of acute infarction, mass  lesion, or intra- or extra-axial hemorrhage on CT. Prominence of the ventricles and sulci reflects mild cortical volume loss. Mild periventricular white matter change likely reflects small vessel ischemic microangiopathy. A small chronic lacunar infarct is noted at the right basal ganglia. The brainstem and fourth ventricle are within normal limits. The cerebral hemispheres demonstrate grossly normal gray-white differentiation. No mass effect or midline shift is seen. There is no evidence of fracture; visualized osseous structures are unremarkable in appearance. The visualized portions of the orbits are within normal limits. The paranasal sinuses and mastoid air cells are well-aerated. No significant soft tissue abnormalities are seen. IMPRESSION: 1. No acute intracranial pathology seen on CT. 2. Mild cortical volume loss and scattered small vessel ischemic microangiopathy. Small chronic lacunar infarct at the right basal ganglia. Electronically Signed   By: Garald Balding M.D.   On: 08/07/2015 23:01   Mr Brain Wo Contrast  08/08/2015  CLINICAL DATA:  Initial evaluation for acute encephalopathy. EXAM: MRI HEAD WITHOUT CONTRAST TECHNIQUE: Multiplanar, multiecho pulse sequences of the brain and surrounding structures were obtained without intravenous contrast. COMPARISON:  Error head CT from 08/07/2015. FINDINGS: Mild diffuse prominence of the CSF containing spaces is compatible with generalized age-related cerebral atrophy. Patchy T2/FLAIR hyperintensity within the periventricular and deep white matter both cerebral hemispheres most consistent with chronic small vessel ischemic disease, fairly mild for patient age. A small remote lacunar infarct present within the body of the right caudate. No abnormal foci of restricted diffusion to suggest acute intracranial infarct. Gray-white matter differentiation maintained. Normal intravascular flow voids are preserved. No acute or chronic intracranial hemorrhage. No mass  lesion, midline shift, or mass effect. No hydrocephalus. No extra-axial fluid collection. Craniocervical junction within normal limits. Degenerative spondylolysis noted at the C4-5 level with resultant mild stenosis. Pituitary gland normal. No acute abnormality about the orbits. Sequela prior bilateral lens extraction noted. Mild mucosal thickening within the ethmoidal air cells and maxillary sinuses. Mild opacity within the inferior right mastoid air cells. Inner ear structures normal. Bone marrow signal intensity within normal limits. No scalp soft tissue abnormality IMPRESSION: 1. No acute intracranial process. 2. Mild age-related cerebral atrophy with chronic small vessel ischemic disease. 3. Small remote lacunar infarct involving the right caudate nucleus. Electronically Signed   By: Jeannine Boga M.D.   On: 08/08/2015 23:08   Dg Chest Port 1 View  08/07/2015  CLINICAL DATA:  Altered mental status.  No chest complaints. EXAM: PORTABLE CHEST 1 VIEW COMPARISON:  06/08/2015 FINDINGS: Mild cardiac enlargement with mild vascular congestion. No edema or consolidation. Mild atelectasis in the lung bases. Vague nodular opacity in the right mid lung measures 10 mm diameter. This likely corresponds to fluid in the minor fissure seen on previous CT chest. No blunting of costophrenic angles. No pneumothorax. Calcification of the aorta. IMPRESSION: Cardiac enlargement with mild vascular  congestion. No edema or consolidation. Atelectasis in the lung bases. Possible fluid in the minor fissure on the right. Electronically Signed   By: Lucienne Capers M.D.   On: 08/07/2015 22:50    Scheduled Meds: . amLODipine  5 mg Oral Daily  . heparin  5,000 Units Subcutaneous 3 times per day  . hydrocortisone sod succinate (SOLU-CORTEF) inj  50 mg Intravenous Q12H  . isosorbide mononitrate  30 mg Oral Q1200  . latanoprost  1 drop Left Eye QHS  . levothyroxine  50 mcg Oral QAC breakfast  . metoprolol succinate  50 mg  Oral Daily  . pantoprazole  20 mg Oral Daily  . sodium chloride  3 mL Intravenous Q12H  . sodium polystyrene  30 g Oral Once  . sodium polystyrene  30 g Oral Once   Continuous Infusions: . sodium chloride 100 mL/hr at 08/08/15 2318    Principal Problem:   Altered mental status Active Problems:   Hyponatremia   Benign essential HTN   Hypothyroidism   Congestive heart disease ([place])   Hypothermia   Secondary adrenal insufficiency (HCC)   AKI (acute kidney injury) ([place])    Time spent: 25 minutes.     [facility] Pager [phone] . If 7PM-7AM, please contact night-coverage at [email or web address], password [facility] 08/09/2015, 11:47 AM  LOS: 1 day

## 2015-08-09 NOTE — Progress Notes (Signed)
Speech Language Pathology Treatment: Dysphagia  Patient Details Name: Katie Mosley MRN: [id number] DOB: 1940/10/22 Today's Date: 08/09/2015 Time: [id number] SLP Time Calculation (min) (ACUTE ONLY): 17 min  Assessment / Plan / Recommendation Clinical Impression  Pt alert today, conversive and granddaughter present interpreting (non English speaking). Pt and family deny history of dysphagia. No signs of aspiration; functional mastication of Dys 3 texture. Min verbal cueing for slower rated. Recommend Dys 3 due to condition and absence of lower dentition. ST will follow.   HPI HPI: Katie Mosley is a 74 y.o. female with a past medical history significant for HTN, hypothyroidism, chronic diastolic CHF and recently diagnosed adrenal insufficiency who presents with obtundation again.      SLP Plan  Continue with current plan of care     Recommendations  Diet recommendations: Dysphagia 3 (mechanical soft);Thin liquid Liquids provided via: Cup;Straw Medication Administration: Whole meds with liquid Supervision: Patient able to self feed;Intermittent supervision to cue for compensatory strategies Compensations: Small sips/bites;Slow rate Postural Changes and/or Swallow Maneuvers: Seated upright 90 degrees              Oral Care Recommendations: Oral care QID Follow up Recommendations: None Plan: Continue with current plan of care   Houston Siren 08/09/2015, 2:27 PM  Orbie Pyo Colvin Caroli.Ed [employer] [phone]

## 2015-08-10 ENCOUNTER — Inpatient Hospital Stay (HOSPITAL_COMMUNITY): Payer: Medicare Other

## 2015-08-10 DIAGNOSIS — G9340 Encephalopathy, unspecified: Secondary | ICD-10-CM | POA: Insufficient documentation

## 2015-08-10 DIAGNOSIS — N179 Acute kidney failure, unspecified: Secondary | ICD-10-CM

## 2015-08-10 DIAGNOSIS — E2749 Other adrenocortical insufficiency: Principal | ICD-10-CM

## 2015-08-10 DIAGNOSIS — E871 Hypo-osmolality and hyponatremia: Secondary | ICD-10-CM

## 2015-08-10 DIAGNOSIS — I10 Essential (primary) hypertension: Secondary | ICD-10-CM

## 2015-08-10 DIAGNOSIS — E039 Hypothyroidism, unspecified: Secondary | ICD-10-CM

## 2015-08-10 DIAGNOSIS — R0602 Shortness of breath: Secondary | ICD-10-CM

## 2015-08-10 LAB — BASIC METABOLIC PANEL
Anion gap: 5 (ref 5–15)
BUN: 37 mg/dL — ABNORMAL HIGH (ref 6–20)
CHLORIDE: 112 mmol/L — AB (ref 101–111)
CO2: 21 mmol/L — AB (ref 22–32)
Calcium: 9 mg/dL (ref 8.9–10.3)
Creatinine, Ser: 1.24 mg/dL — ABNORMAL HIGH (ref 0.44–1.00)
GFR calc non Af Amer: 42 mL/min — ABNORMAL LOW (ref >60)
GFR, EST AFRICAN AMERICAN: 48 mL/min — AB (ref >60)
Glucose, Bld: 115 mg/dL — ABNORMAL HIGH (ref 65–99)
POTASSIUM: 4.3 mmol/L (ref 3.5–5.1)
SODIUM: 138 mmol/L (ref 135–145)

## 2015-08-10 LAB — CBC
HEMATOCRIT: 24.8 % — AB (ref 36.0–46.0)
HEMOGLOBIN: 8.3 g/dL — AB (ref 12.0–15.0)
MCH: 29.6 pg (ref 26.0–34.0)
MCHC: 33.5 g/dL (ref 30.0–36.0)
MCV: 88.6 fL (ref 78.0–100.0)
Platelets: 190 10*3/uL (ref 150–400)
RBC: 2.8 MIL/uL — AB (ref 3.87–5.11)
RDW: 16.3 % — ABNORMAL HIGH (ref 11.5–15.5)
WBC: 5.5 10*3/uL (ref 4.0–10.5)

## 2015-08-10 LAB — MAGNESIUM: MAGNESIUM: 1.9 mg/dL (ref 1.7–2.4)

## 2015-08-10 MED ORDER — ALBUTEROL SULFATE HFA 108 (90 BASE) MCG/ACT IN AERS: 2.0000 | INHALATION_SPRAY | Freq: Four times a day (QID) | RESPIRATORY_TRACT | Status: DC | PRN

## 2015-08-10 MED ORDER — FUROSEMIDE 10 MG/ML IJ SOLN
20.0000 mg | Freq: Once | INTRAMUSCULAR | Status: AC
  Administered 2015-08-10: 20 mg via INTRAVENOUS
  Filled 2015-08-10: qty 2

## 2015-08-10 MED ORDER — FUROSEMIDE 20 MG PO TABS
20.0000 mg | ORAL_TABLET | Freq: Every day | ORAL | Status: DC
  Administered 2015-08-11: 20 mg via ORAL
  Filled 2015-08-10 (×2): qty 1

## 2015-08-10 MED ORDER — ALBUTEROL SULFATE (2.5 MG/3ML) 0.083% IN NEBU: 2.5000 mg | INHALATION_SOLUTION | RESPIRATORY_TRACT | Status: DC | PRN

## 2015-08-10 NOTE — Progress Notes (Signed)
[facility] PROGRESS NOTE  Katie Mosley [id number] DOB: [birth year] DOA: 08/07/2015 PCP: Philis Fendt, MD  Assessment/Plan: #1 acute encephalopathy Unknown etiology. Likely metabolic encephalopathy likely secondary to adrenal insufficiency. Patient with some electrolyte derangements and history of recently diagnosed adrenal insufficiency. Patient was placed on IV Solu-Cortef every 8 hours with improvement with electrolyte derangements. TSH and free T4 were elevated however, improved from October 2016. Continue IV Solu-Cortef taper. Continue Synthroid. Will likely need to follow-up with her endocrinologist as outpatient. Follow.  #2 shortness of breath Patient with some complaints of shortness of breath. Patient noted to have left basilar crackles on examination. Patient is +2 L during this hospitalization. Patient is not on her diuretics. Will give patient Lasix 20 mg IV x 1. And resume home regimen tomorrow.  Repeat chest x-ray. Follow.  #3 hypertensive crisis Improved. Saline lock IV fluids. Continue current regimen of Norvasc,imdur, Toprol-XL.  #4 hypothyroidism TSH was elevated at 9.81 from 11.92 on 06/08/2015. Continue current dose of Synthroid. Outpatient follow-up with her endocrinologist.  #5 adrenal insufficiency Currently on IV Solu-Cortef. Continue IV Solu-Cortef taper and taper down to her home regimen of oral Cortef.  #6 chronic diastolic CHF See problem #2.  #7 acute kidney injury Improved. Monitor closely with diuresis.  #8 prophylaxis Heparin for DVT prophylaxis.  Code Status: Full Family Communication: Updated patient and family at bedside. Disposition Plan: Home when medically stable and off IV medications and at baseline.   Consultants:  None  Procedures:  CT head 08/07/2015  Chest x-ray 08/10/2015, 08/07/2015  MRI head 08/08/2015    Antibiotics:  None  HPI/Subjective: Patient c/o SOB. Patient alert and per family close to  baseline.  Objective: Filed Vitals:   08/10/15 0517 08/10/15 0834  BP: 140/89 164/58  Pulse: 68 63  Temp: 97.6 F (36.4 C) 98 F (36.7 C)  Resp: 18 17    Intake/Output Summary (Last 24 hours) at 08/10/15 1219 Last data filed at 08/10/15 0957  Gross per 24 hour  Intake    580 ml  Output    200 ml  Net    380 ml   Filed Weights   08/07/15 2211 08/08/15 0457  Weight: 55.792 kg (123 lb) 52.617 kg (116 lb)    Exam:   General:  NAD  Cardiovascular: RRR  Respiratory: Left basilar crackles.  Abdomen: Soft, nontender, nondistended, positive bowel sounds.  Musculoskeletal: No clubbing cyanosis or edema.   Data Reviewed: Basic Metabolic Panel:  Recent Labs Lab 08/07/15 2315 08/07/15 2329 08/08/15 0523 08/09/15 0545 08/10/15 0827  NA 126* 127* 129* 135 138  K 5.9* 5.7* 5.4* 4.6 4.3  CL 95* 95* 97* 106 112*  CO2 24  --  23 18* 21*  GLUCOSE 136* 133* 102* 125* 115*  BUN 34* 33* 36* 28* 37*  CREATININE 1.92* 1.80* 1.82* 1.23* 1.24*  CALCIUM 9.9  --  9.7 9.2 9.0  MG  --   --   --   --  1.9   Liver Function Tests:  Recent Labs Lab 08/07/15 2315 08/08/15 0523  AST 93* 90*  ALT 61* 58*  ALKPHOS 110 104  BILITOT 0.7 1.1  PROT 7.0 6.9  ALBUMIN 3.1* 3.0*   No results for input(s): LIPASE, AMYLASE in the last 168 hours.  Recent Labs Lab 08/08/15 0403  AMMONIA 25   CBC:  Recent Labs Lab 08/07/15 2315 08/07/15 2329 08/08/15 0523 08/09/15 0545 08/10/15 0827  WBC 3.4*  --  4.2 4.1 5.5  NEUTROABS 2.3  --   --   --   --  HGB 11.4* 12.9 10.9* 10.0* 8.3*  HCT 32.9* 38.0 32.2* 29.4* 24.8*  MCV 86.4  --  87.0 87.2 88.6  PLT 246  --  243 216 190   Cardiac Enzymes: No results for input(s): CKTOTAL, CKMB, CKMBINDEX, TROPONINI in the last 168 hours. BNP (last 3 results)  Recent Labs  02/13/15 0115 06/08/15 1724 08/07/15 2315  BNP 472.1* 375.5* 395.8*    ProBNP (last 3 results) No results for input(s): PROBNP in the last 8760  hours.  CBG:  Recent Labs Lab 08/07/15 2332  GLUCAP 114*    Recent Results (from the past 240 hour(s))  Culture, blood (routine x 2)     Status: None (Preliminary result)   Collection Time: 08/08/15  3:58 AM  Result Value Ref Range Status   Specimen Description BLOOD RIGHT FOREARM  Final   Special Requests BOTTLES DRAWN AEROBIC AND ANAEROBIC 5CC   Final   Culture NO GROWTH 2 DAYS  Final   Report Status PENDING  Incomplete  Culture, blood (routine x 2)     Status: None (Preliminary result)   Collection Time: 08/08/15  4:03 AM  Result Value Ref Range Status   Specimen Description BLOOD RIGHT HAND  Final   Special Requests BOTTLES DRAWN AEROBIC AND ANAEROBIC 5CC   Final   Culture NO GROWTH 2 DAYS  Final   Report Status PENDING  Incomplete     Studies: Mr Brain Wo Contrast  08/08/2015  CLINICAL DATA:  Initial evaluation for acute encephalopathy. EXAM: MRI HEAD WITHOUT CONTRAST TECHNIQUE: Multiplanar, multiecho pulse sequences of the brain and surrounding structures were obtained without intravenous contrast. COMPARISON:  Error head CT from 08/07/2015. FINDINGS: Mild diffuse prominence of the CSF containing spaces is compatible with generalized age-related cerebral atrophy. Patchy T2/FLAIR hyperintensity within the periventricular and deep white matter both cerebral hemispheres most consistent with chronic small vessel ischemic disease, fairly mild for patient age. A small remote lacunar infarct present within the body of the right caudate. No abnormal foci of restricted diffusion to suggest acute intracranial infarct. Gray-white matter differentiation maintained. Normal intravascular flow voids are preserved. No acute or chronic intracranial hemorrhage. No mass lesion, midline shift, or mass effect. No hydrocephalus. No extra-axial fluid collection. Craniocervical junction within normal limits. Degenerative spondylolysis noted at the C4-5 level with resultant mild stenosis. Pituitary gland  normal. No acute abnormality about the orbits. Sequela prior bilateral lens extraction noted. Mild mucosal thickening within the ethmoidal air cells and maxillary sinuses. Mild opacity within the inferior right mastoid air cells. Inner ear structures normal. Bone marrow signal intensity within normal limits. No scalp soft tissue abnormality IMPRESSION: 1. No acute intracranial process. 2. Mild age-related cerebral atrophy with chronic small vessel ischemic disease. 3. Small remote lacunar infarct involving the right caudate nucleus. Electronically Signed   By: Jeannine Boga M.D.   On: 08/08/2015 23:08    Scheduled Meds: . amLODipine  5 mg Oral Daily  . heparin  5,000 Units Subcutaneous 3 times per day  . hydrocortisone sod succinate (SOLU-CORTEF) inj  50 mg Intravenous Q12H  . isosorbide mononitrate  30 mg Oral Q1200  . latanoprost  1 drop Left Eye QHS  . levothyroxine  50 mcg Oral QAC breakfast  . metoprolol succinate  50 mg Oral Daily  . pantoprazole  20 mg Oral Daily  . sodium chloride  3 mL Intravenous Q12H  . sodium polystyrene  30 g Oral Once  . sodium polystyrene  30 g Oral Once   Continuous  Infusions: . sodium chloride 50 mL/hr at 08/09/15 1210    Principal Problem:   Altered mental status Active Problems:   Hyponatremia   Benign essential HTN   Hypothyroidism   Congestive heart disease (HCC)   Hypothermia   Secondary adrenal insufficiency (HCC)   AKI (acute kidney injury) ([place])    Time spent: 41 minutes    Benita Boonstra M.D. [facility] Pager [phone]. If 7PM-7AM, please contact night-coverage at [email or web address], password [facility] 08/10/2015, 12:19 PM  LOS: 2 days

## 2015-08-11 DIAGNOSIS — I5033 Acute on chronic diastolic (congestive) heart failure: Secondary | ICD-10-CM | POA: Insufficient documentation

## 2015-08-11 LAB — CBC
HCT: 24 % — ABNORMAL LOW (ref 36.0–46.0)
HEMOGLOBIN: 8.1 g/dL — AB (ref 12.0–15.0)
MCH: 30 pg (ref 26.0–34.0)
MCHC: 33.8 g/dL (ref 30.0–36.0)
MCV: 88.9 fL (ref 78.0–100.0)
PLATELETS: 201 10*3/uL (ref 150–400)
RBC: 2.7 MIL/uL — AB (ref 3.87–5.11)
RDW: 16.3 % — ABNORMAL HIGH (ref 11.5–15.5)
WBC: 5 10*3/uL (ref 4.0–10.5)

## 2015-08-11 LAB — BASIC METABOLIC PANEL
Anion gap: 9 (ref 5–15)
BUN: 33 mg/dL — ABNORMAL HIGH (ref 6–20)
CHLORIDE: 106 mmol/L (ref 101–111)
CO2: 22 mmol/L (ref 22–32)
CREATININE: 1.12 mg/dL — AB (ref 0.44–1.00)
Calcium: 8.8 mg/dL — ABNORMAL LOW (ref 8.9–10.3)
GFR, EST AFRICAN AMERICAN: 55 mL/min — AB (ref >60)
GFR, EST NON AFRICAN AMERICAN: 47 mL/min — AB (ref >60)
Glucose, Bld: 108 mg/dL — ABNORMAL HIGH (ref 65–99)
POTASSIUM: 3.7 mmol/L (ref 3.5–5.1)
SODIUM: 137 mmol/L (ref 135–145)

## 2015-08-11 LAB — TROPONIN I: TROPONIN I: 0.06 ng/mL — AB (ref <0.031)

## 2015-08-11 MED ORDER — HYDROCORTISONE 20 MG PO TABS
20.0000 mg | ORAL_TABLET | Freq: Every day | ORAL | Status: DC
  Administered 2015-08-11: 20 mg via ORAL
  Administered 2015-08-13: 10 mg via ORAL
  Filled 2015-08-11 (×3): qty 1

## 2015-08-11 MED ORDER — FUROSEMIDE 10 MG/ML IJ SOLN
20.0000 mg | Freq: Once | INTRAMUSCULAR | Status: AC
  Administered 2015-08-11: 20 mg via INTRAVENOUS
  Filled 2015-08-11: qty 2

## 2015-08-11 MED ORDER — HYDROCORTISONE 10 MG PO TABS
10.0000 mg | ORAL_TABLET | Freq: Every morning | ORAL | Status: DC
  Administered 2015-08-11 – 2015-08-13 (×3): 10 mg via ORAL
  Filled 2015-08-11 (×3): qty 1

## 2015-08-11 NOTE — Evaluation (Signed)
Occupational Therapy Evaluation Patient Details Name: Katie Mosley MRN: [id number] DOB: 03-26-1941 Today's Date: 08/11/2015    History of Present Illness PT is a 74 yo female admitted for acute encephalopathy of unknown origin.  Pt with SOB, adrenal insufficiency, CHF, HTN and was obtunded.  MRI and CT -.  Pt with AKI.     Clinical Impression   Pt admitted with above diagnosis and has the deficits listed below.  Pt would benefit from cont OT to increase independence and safety with all basic adls so she can d/c safely home with her family.    Follow Up Recommendations  Home health OT;Supervision/Assistance - 24 hour    Equipment Recommendations  None recommended by OT    Recommendations for Other Services       Precautions / Restrictions Precautions Precautions: Fall Precaution Comments: non English speaking. Daughter translates Restrictions Weight Bearing Restrictions: No      Mobility Bed Mobility Overal bed mobility: Modified Independent             General bed mobility comments: Pt came to sit on EOB with bedrails down and bed flat.  Transfers Overall transfer level: Needs assistance Equipment used: 1 person hand held assist Transfers: Sit to/from Stand Sit to Stand: Min guard         General transfer comment: steading assist once up.    Balance Overall balance assessment: Needs assistance Sitting-balance support: Feet supported Sitting balance-Leahy Scale: Good     Standing balance support: Single extremity supported Standing balance-Leahy Scale: Fair Standing balance comment: on occasion, pt would take short steps and appear unsteady but never did lose balance fully.                            ADL Overall ADL's : Needs assistance/impaired Eating/Feeding: Set up;Sitting   Grooming: Wash/dry face;Wash/dry hands;Oral care;Supervision/safety;Standing   Upper Body Bathing: Set up;Sitting   Lower Body Bathing: Minimal  assistance;Sit to/from stand Lower Body Bathing Details (indicate cue type and reason): min assist when on feet to ensure balance. Upper Body Dressing : Set up;Sitting   Lower Body Dressing: Minimal assistance;Sit to/from stand Lower Body Dressing Details (indicate cue type and reason): min assist only to maintain balance while fastening pants etc. Toilet Transfer: Minimal assistance;Ambulation;Comfort height toilet;Grab bars Toilet Transfer Details (indicate cue type and reason): walked to bathroom with hand held assist.  The further the patient walked, the safer she became on her feet. Toileting- Water quality scientist and Hygiene: Min guard;Sit to/from stand       Functional mobility during ADLs: Minimal assistance General ADL Comments: Pt did well with all adls in sitting.  When standing, S to min assist was provided for balance.  Family is around 24/7 at home.     Vision     Perception     Praxis      Pertinent Vitals/Pain Pain Assessment: No/denies pain     Hand Dominance Right   Extremity/Trunk Assessment Upper Extremity Assessment Upper Extremity Assessment: Overall WFL for tasks assessed   Lower Extremity Assessment Lower Extremity Assessment: Defer to PT evaluation   Cervical / Trunk Assessment Cervical / Trunk Assessment: Normal   Communication Communication Communication: Prefers language other than [place];Interpreter utilized   Cognition Arousal/Alertness: Awake/alert Behavior During Therapy: WFL for tasks assessed/performed Overall Cognitive Status: Difficult to assess                     General  Comments       Exercises       Shoulder Instructions      Home Living Family/patient expects to be discharged to:: Private residence Living Arrangements: Children;Other relatives Available Help at Discharge: Family;Available 24 hours/day Type of Home: House Home Access: Stairs to enter [employer] of Steps: 3 Entrance  Stairs-Rails: Right;Left;Can reach both Home Layout: One level     Bathroom Shower/Tub: Tub/shower unit;Curtain Shower/tub characteristics: Architectural technologist: Standard     Home Equipment: Clinical cytogeneticist - 2 wheels          Prior Functioning/Environment Level of Independence: Independent             OT Diagnosis: Generalized weakness;Other (comment) (mild confusion per family.)   OT Problem List: Impaired balance (sitting and/or standing);Decreased cognition;Decreased knowledge of use of DME or AE   OT Treatment/Interventions: Self-care/ADL training;Therapeutic activities;DME and/or AE instruction    OT Goals(Current goals can be found in the care plan section) Acute Rehab OT Goals Patient Stated Goal: to go home OT Goal Formulation: With patient/family Time For Goal Achievement: 08/25/15 Potential to Achieve Goals: Good ADL Goals Pt Will Perform Lower Body Bathing: with modified independence;sit to/from stand Pt Will Perform Tub/Shower Transfer: with supervision;ambulating;shower seat;Shower transfer Additional ADL Goal #1: Pt will walk to bathroom and toilet with mod I.  OT Frequency: Min 2X/week   Barriers to D/C:    great family support       Co-evaluation              End of Session Nurse Communication: Mobility status  Activity Tolerance: Patient tolerated treatment well Patient left: in chair;with call bell/phone within reach;with family/visitor present   Time: [id number] OT Time Calculation (min): 24 min Charges:  OT General Charges $OT Visit: 1 Procedure OT Evaluation $Initial OT Evaluation Tier I: 1 Procedure OT Treatments $Self Care/Home Management : 8-22 mins G-Codes:    Glenford Peers 08/18/15, 11:51 AM  [phone]

## 2015-08-11 NOTE — Evaluation (Signed)
Physical Therapy Evaluation Patient Details Name: Katie Mosley MRN: [id number] DOB: Sep 08, 1940 Today's Date: 08/11/2015   History of Present Illness  PT is a 74 yo female admitted for acute encephalopathy of unknown origin.  Pt with SOB, adrenal insufficiency, CHF, HTN and was obtunded.  MRI and CT -.  Pt with AKI.    Clinical Impression  Pt admitted with above complications. Pt currently with functional limitations due to the deficits listed below (see PT Problem List). Stability improved with use of a rolling walker during gait. Did not require physical assistance to ambulate. Becomes dyspneic however SpO2 98% on room air, improves with seated rest break. Granddaughter present and very supportive. Reports pt has 24/7 assist available as needed. Pt will benefit from skilled PT to increase their independence and safety with mobility to allow discharge to the venue listed below.       Follow Up Recommendations No PT follow up;Supervision - Intermittent    Equipment Recommendations  None recommended by PT    Recommendations for Other Services       Precautions / Restrictions Precautions Precautions: Fall Precaution Comments: non English speaking. Daughter translates Restrictions Weight Bearing Restrictions: No      Mobility  Bed Mobility Overal bed mobility: Modified Independent             General bed mobility comments: sitting in recliner  Transfers Overall transfer level: Needs assistance Equipment used: Rolling walker (2 wheeled) Transfers: Sit to/from Stand Sit to Stand: Supervision         General transfer comment: supervision for safety. no assist needed.  Ambulation/Gait Ambulation/Gait assistance: Supervision Ambulation Distance (Feet): 150 Feet Assistive device: Rolling walker (2 wheeled);None Gait Pattern/deviations: Step-through pattern;Decreased stride length;Antalgic Gait velocity: decreased Gait velocity interpretation: Below normal speed for  age/gender General Gait Details: Educated on safe DME use with a rolling walker. Some difficulty navigating RW but improved as distance increased. Half distance performed with RW, slightly less stable but did not require physical assist to correct.   Stairs            Wheelchair Mobility    Modified Rankin (Stroke Patients Only)       Balance Overall balance assessment: Needs assistance Sitting-balance support: No upper extremity supported;Feet supported Sitting balance-Leahy Scale: Normal     Standing balance support: No upper extremity supported Standing balance-Leahy Scale: Good Standing balance comment: on occasion, pt would take short steps and appear unsteady but never did lose balance fully.                             Pertinent Vitals/Pain Pain Assessment: No/denies pain    Home Living Family/patient expects to be discharged to:: Private residence Living Arrangements: Children;Other relatives Available Help at Discharge: Family;Available 24 hours/day Type of Home: House Home Access: Stairs to enter Entrance Stairs-Rails: Right;Left;Can reach both Entrance Stairs-Number of Steps: 3 Home Layout: One level Home Equipment: Clinical cytogeneticist - 2 wheels      Prior Function Level of Independence: Independent               Hand Dominance   Dominant Hand: Right    Extremity/Trunk Assessment   Upper Extremity Assessment: Defer to OT evaluation           Lower Extremity Assessment: Overall WFL for tasks assessed      Cervical / Trunk Assessment: Normal  Communication   Communication: Prefers language other than English;Interpreter utilized  Cognition Arousal/Alertness: Awake/alert Behavior During Therapy: WFL for tasks assessed/performed Overall Cognitive Status: Difficult to assess                      General Comments General comments (skin integrity, edema, etc.): Granddaughter present, very supportive. Pt and  granddaughter with no concerns or questions at this time. SpO2 98% on room air, 3/4 dyspnea resolves with sitting, HR 112.    Exercises        Assessment/Plan    PT Assessment Patient needs continued PT services  PT Diagnosis Difficulty walking;Abnormality of gait   PT Problem List Decreased activity tolerance;Decreased balance;Decreased mobility;Decreased knowledge of use of DME  PT Treatment Interventions DME instruction;Gait training;Stair training;Functional mobility training;Therapeutic activities;Therapeutic exercise;Balance training;Patient/family education   PT Goals (Current goals can be found in the Care Plan section) Acute Rehab PT Goals Patient Stated Goal: to go home PT Goal Formulation: With patient Time For Goal Achievement: 08/25/15 Potential to Achieve Goals: Good    Frequency Min 3X/week   Barriers to discharge        Co-evaluation               End of Session   Activity Tolerance: Patient tolerated treatment well Patient left: in chair;with call bell/phone within reach;with family/visitor present Nurse Communication: Mobility status         Time: 1227-1237 PT Time Calculation (min) (ACUTE ONLY): 10 min   Charges:   PT Evaluation $Initial PT Evaluation Tier I: 1 Procedure     PT G CodesEllouise Newer 08/11/2015, 1:19 PM  Elayne Snare, [place]

## 2015-08-11 NOTE — Progress Notes (Signed)
Speech Language Pathology Treatment:  Dysphagia  Patient Details Name: ZOEII REINIG MRN: [id number] DOB: Feb 20, 1941 Today's Date: 08/11/2015 Time: [id number] SLP Time Calculation (min) (ACUTE ONLY): 8 min  Assessment / Plan / Recommendation Clinical Impression  SLP provided skilled observation of thin liquids via straw. Patient consumed without overt s/s of aspiration. Patient declined trials of solid textures due to feeling full from recent lunch meal. Patient's granddaughter present and reported patient appeared to consume meal without difficulty and that she consumes mechanical soft textures at home. RN also reported no overt difficulty with medications. Recommend patient continue current diet.    HPI HPI: NICOLI ROAM is a 74 y.o. female with a past medical history significant for HTN, hypothyroidism, chronic diastolic CHF and recently diagnosed adrenal insufficiency who presents with obtundation again.      SLP Plan  Continue with current plan of care     Recommendations  Diet recommendations: Dysphagia 3 (mechanical soft);Thin liquid Liquids provided via: Cup;Straw Medication Administration: Whole meds with liquid Supervision: Patient able to self feed;Intermittent supervision to cue for compensatory strategies Compensations: Small sips/bites;Slow rate Postural Changes and/or Swallow Maneuvers: Seated upright 90 degrees              Oral Care Recommendations: Oral care QID Follow up Recommendations: None Plan: Continue with current plan of care   [place], [place] 08/11/2015, 2:12 PM

## 2015-08-11 NOTE — Care Management Important Message (Signed)
Important Message  Patient Details  Name: Katie Mosley MRN: [id number] Date of Birth: 03-11-1941   Medicare Important Message Given:  Yes    Louanne Belton 08/11/2015, 2:28 [place] Message  Patient Details  Name: Katie Mosley MRN: [id number] Date of Birth: 08/05/1941   Medicare Important Message Given:  Yes    Mirza Fessel G 08/11/2015, 2:28 PM

## 2015-08-11 NOTE — Progress Notes (Signed)
Pt. Transferred from [place] to 6e09. Notified [facility] at [facility] pt is still on box 11.

## 2015-08-11 NOTE — Progress Notes (Signed)
[facility] PROGRESS NOTE  Katie Mosley [id number] DOB: 06-Jun-1941 DOA: 08/07/2015 PCP: Philis Fendt, MD  Assessment/Plan: #1 acute encephalopathy Unknown etiology. Likely metabolic encephalopathy likely secondary to adrenal insufficiency. Clinical improvement and close to baseline. Patient with some electrolyte derangements and history of recently diagnosed adrenal insufficiency. Patient was placed on IV Solu-Cortef every 8 hours with improvement with electrolyte derangements. TSH and free T4 were elevated however, improved from October 2016. Change IV Solu-Cortef to home oral dose Cortef. Continue Synthroid. Will likely need to follow-up with her endocrinologist as outpatient. Follow.  #2 shortness of breath Patient with some complaints of shortness of breath. Patient noted to have left basilar crackles on examination. Patient is +2 L during this hospitalization. Patient is not on her diuretics. Patient with clinical improvement however urine output was not correctly recorded. Cycle cardiac enzymes every 6 hours 3. Repeat EKG. Patient with recent 2-D echo on 02/13/2015 with normal EF and no wall motion abnormalities. Repeat chest x-ray consistent with volume overload. Continue IV Lasix 20 mg every 12 hours 2 doses. Follow.  #3 hypertensive crisis Improved. Saline lock IV fluids. Continue current regimen of Norvasc,imdur, Toprol-XL.  #4 hypothyroidism TSH was elevated at 9.81 from 11.92 on 06/08/2015. Continue current dose of Synthroid. Outpatient follow-up with her endocrinologist.  #5 adrenal insufficiency Currently on IV Solu-Cortef. Change IV Solu-Cortef to oral home dose Cortef.  #6 probable acute on chronic diastolic CHF See problem #2. Patient with clinical improvement with diuresis. Output not correctly recorded. Patient denies any chest pain. EKG on admission unremarkable. Repeat EKG. Cycle cardiac enzymes. 2-D echo from 02/13/2015 with EF of 55-60% with no wall  motion abnormalities. Will place on Lasix 20 mg IV every 12 hours 2 doses. Strict I's and O's. Daily weights. Follow.  #7 acute kidney injury Improving now with diuresis. Monitor closely with diuresis.  #8 prophylaxis Heparin for DVT prophylaxis.  Code Status: Full Family Communication: Updated patient and family at bedside. Disposition Plan: Home when medically stable and off IV medications and improvement with her breathing and at baseline. Hopefully 1-2 days.   Consultants:  None  Procedures:  CT head 08/07/2015  Chest x-ray 08/10/2015, 08/07/2015  MRI head 08/08/2015    Antibiotics:  None  HPI/Subjective: Patient states shortness of breath improved from yesterday however still with some shortness of breath and not currently at baseline. Patient alert and per family close to baseline. Patient denies any chest pain. Patient states she's had good urine output however not correctly recorded.  Objective: Filed Vitals:   08/11/15 0427 08/11/15 0856  BP: 126/68 178/77  Pulse: 71 73  Temp: 98.7 F (37.1 C) 98 F (36.7 C)  Resp: 19 18    Intake/Output Summary (Last 24 hours) at 08/11/15 1208 Last data filed at 08/11/15 1043  Gross per 24 hour  Intake    720 ml  Output    350 ml  Net    370 ml   Filed Weights   08/08/15 0457 08/10/15 2137 08/11/15 0500  Weight: 52.617 kg (116 lb) 55.3 kg (121 lb 14.6 oz) 55.3 kg (121 lb 14.6 oz)    Exam:   General:  NAD  Cardiovascular: RRR. + JVD  Respiratory: Left basilar crackles.  Abdomen: Soft, nontender, nondistended, positive bowel sounds.  Musculoskeletal: No clubbing cyanosis. Trace-1 + BLE edema.  Data Reviewed: Basic Metabolic Panel:  Recent Labs Lab 08/07/15 2315 08/07/15 2329 08/08/15 0523 08/09/15 0545 08/10/15 0827 08/11/15 0732  NA 126* 127* 129* 135 138  137  K 5.9* 5.7* 5.4* 4.6 4.3 3.7  CL 95* 95* 97* 106 112* 106  CO2 24  --  23 18* 21* 22  GLUCOSE 136* 133* 102* 125* 115* 108*  BUN  34* 33* 36* 28* 37* 33*  CREATININE 1.92* 1.80* 1.82* 1.23* 1.24* 1.12*  CALCIUM 9.9  --  9.7 9.2 9.0 8.8*  MG  --   --   --   --  1.9  --    Liver Function Tests:  Recent Labs Lab 08/07/15 2315 08/08/15 0523  AST 93* 90*  ALT 61* 58*  ALKPHOS 110 104  BILITOT 0.7 1.1  PROT 7.0 6.9  ALBUMIN 3.1* 3.0*   No results for input(s): LIPASE, AMYLASE in the last 168 hours.  Recent Labs Lab 08/08/15 0403  AMMONIA 25   CBC:  Recent Labs Lab 08/07/15 2315 08/07/15 2329 08/08/15 0523 08/09/15 0545 08/10/15 0827 08/11/15 0732  WBC 3.4*  --  4.2 4.1 5.5 5.0  NEUTROABS 2.3  --   --   --   --   --   HGB 11.4* 12.9 10.9* 10.0* 8.3* 8.1*  HCT 32.9* 38.0 32.2* 29.4* 24.8* 24.0*  MCV 86.4  --  87.0 87.2 88.6 88.9  PLT 246  --  243 216 190 201   Cardiac Enzymes: No results for input(s): CKTOTAL, CKMB, CKMBINDEX, TROPONINI in the last 168 hours. BNP (last 3 results)  Recent Labs  02/13/15 0115 06/08/15 1724 08/07/15 2315  BNP 472.1* 375.5* 395.8*    ProBNP (last 3 results) No results for input(s): PROBNP in the last 8760 hours.  CBG:  Recent Labs Lab 08/07/15 2332  GLUCAP 114*    Recent Results (from the past 240 hour(s))  Culture, blood (routine x 2)     Status: None (Preliminary result)   Collection Time: 08/08/15  3:58 AM  Result Value Ref Range Status   Specimen Description BLOOD RIGHT FOREARM  Final   Special Requests BOTTLES DRAWN AEROBIC AND ANAEROBIC 5CC   Final   Culture NO GROWTH 2 DAYS  Final   Report Status PENDING  Incomplete  Culture, blood (routine x 2)     Status: None (Preliminary result)   Collection Time: 08/08/15  4:03 AM  Result Value Ref Range Status   Specimen Description BLOOD RIGHT HAND  Final   Special Requests BOTTLES DRAWN AEROBIC AND ANAEROBIC 5CC   Final   Culture NO GROWTH 2 DAYS  Final   Report Status PENDING  Incomplete     Studies: Dg Chest Port 1 View  08/10/2015  CLINICAL DATA:  Shortness of breath, hypertension, CHF.  EXAM: PORTABLE CHEST 1 VIEW COMPARISON:  08/07/2015 and 06/08/2015 FINDINGS: Lungs are adequately inflated and demonstrate worsening patchy perihilar opacification which may be due to worsening vascular congestion although cannot exclude developing infection. Opacification over the left costophrenic angle which may represent a small amount left pleural fluid. Stable cardiomegaly. Mild calcified plaque over the aortic arch. Remainder of the exam is unchanged. IMPRESSION: Interval worsening patchy bilateral perihilar opacification which may be due to worsening vascular congestion versus infection. Possible small amount left pleural fluid. Stable cardiomegaly. Electronically Signed   By: Marin Olp M.D.   On: 08/10/2015 12:44    Scheduled Meds: . amLODipine  5 mg Oral Daily  . furosemide  20 mg Intravenous Once  . furosemide  20 mg Intravenous Once  . furosemide  20 mg Oral Daily  . heparin  5,000 Units Subcutaneous 3 times per day  .  hydrocortisone  10 mg Oral q morning - 10a  . hydrocortisone  20 mg Oral QHS  . isosorbide mononitrate  30 mg Oral Q1200  . latanoprost  1 drop Left Eye QHS  . levothyroxine  50 mcg Oral QAC breakfast  . metoprolol succinate  50 mg Oral Daily  . pantoprazole  20 mg Oral Daily  . sodium chloride  3 mL Intravenous Q12H  . sodium polystyrene  30 g Oral Once  . sodium polystyrene  30 g Oral Once   Continuous Infusions:    Principal Problem:   Altered mental status Active Problems:   Hyponatremia   Benign essential HTN   Hypothyroidism   Congestive heart disease (HCC)   Hypothermia   Secondary adrenal insufficiency (HCC)   AKI (acute kidney injury) ([place])   Acute encephalopathy    Time spent: 35 minutes    Ransome Helwig M.D. [facility] Pager [phone]. If 7PM-7AM, please contact night-coverage at [email or web address], password [facility] 08/11/2015, 12:08 PM  LOS: 3 days

## 2015-08-12 DIAGNOSIS — K5900 Constipation, unspecified: Secondary | ICD-10-CM | POA: Insufficient documentation

## 2015-08-12 LAB — BASIC METABOLIC PANEL
ANION GAP: 7 (ref 5–15)
BUN: 33 mg/dL — ABNORMAL HIGH (ref 6–20)
CHLORIDE: 102 mmol/L (ref 101–111)
CO2: 27 mmol/L (ref 22–32)
Calcium: 8.5 mg/dL — ABNORMAL LOW (ref 8.9–10.3)
Creatinine, Ser: 1.03 mg/dL — ABNORMAL HIGH (ref 0.44–1.00)
GFR calc Af Amer: >60 mL/min (ref >60)
GFR, EST NON AFRICAN AMERICAN: 52 mL/min — AB (ref >60)
GLUCOSE: 95 mg/dL (ref 65–99)
POTASSIUM: 3.3 mmol/L — AB (ref 3.5–5.1)
Sodium: 136 mmol/L (ref 135–145)

## 2015-08-12 LAB — CBC
HEMATOCRIT: 24.5 % — AB (ref 36.0–46.0)
HEMOGLOBIN: 8.2 g/dL — AB (ref 12.0–15.0)
MCH: 29.4 pg (ref 26.0–34.0)
MCHC: 33.5 g/dL (ref 30.0–36.0)
MCV: 87.8 fL (ref 78.0–100.0)
PLATELETS: 215 10*3/uL (ref 150–400)
RBC: 2.79 MIL/uL — AB (ref 3.87–5.11)
RDW: 16 % — ABNORMAL HIGH (ref 11.5–15.5)
WBC: 6.5 10*3/uL (ref 4.0–10.5)

## 2015-08-12 LAB — ALDOSTERONE + RENIN ACTIVITY W/ RATIO
ALDO / PRA RATIO: 4.3 (ref 0.0–30.0)
ALDOSTERONE: 3.9 ng/dL (ref 0.0–30.0)
PRA LC/MS/MS: 0.9 ng/mL/hr

## 2015-08-12 LAB — TROPONIN I: Troponin I: 0.06 ng/mL — ABNORMAL HIGH (ref <0.031)

## 2015-08-12 MED ORDER — POTASSIUM CHLORIDE CRYS ER 20 MEQ PO TBCR
40.0000 meq | EXTENDED_RELEASE_TABLET | Freq: Once | ORAL | Status: AC
  Administered 2015-08-12: 40 meq via ORAL
  Filled 2015-08-12: qty 2

## 2015-08-12 MED ORDER — SORBITOL 70 % SOLN
30.0000 mL | Freq: Once | Status: AC
  Administered 2015-08-12: 30 mL via ORAL
  Filled 2015-08-12: qty 30

## 2015-08-12 MED ORDER — FUROSEMIDE 10 MG/ML IJ SOLN
20.0000 mg | Freq: Two times a day (BID) | INTRAMUSCULAR | Status: DC
  Administered 2015-08-12 (×2): 20 mg via INTRAVENOUS
  Filled 2015-08-12 (×2): qty 2

## 2015-08-12 NOTE — Care Management Note (Signed)
Case Management Note  Patient Details  Name: Katie Mosley MRN: [id number] Date of Birth: 1941-06-25  Subjective/Objective:        CM following for progression and d/c planning.            Action/Plan: 08/12/2015 Met with pt and family member who states that they would like HHPT and OT services and [facility] selected. [place] notified.   Expected Discharge Date:  08/12/2015               Expected Discharge Plan:  [place]  In-House Referral:  NA  Discharge planning Services  CM Consult  Post Acute Care Choice:  NA Choice offered to:  Adult Children  DME Arranged:  N/A DME Agency:  NA  HH Arranged:  PT, OT HH Agency:  [facility]  Status of Service:  Completed, signed off  Medicare Important Message Given:  Yes Date Medicare IM Given:    Medicare IM give by:    Date Additional Medicare IM Given:    Additional Medicare Important Message give by:     If discussed at [place] of Stay Meetings, dates discussed:    Additional Comments:  Adron Bene, RN 08/12/2015, 2:02 PM

## 2015-08-12 NOTE — Progress Notes (Signed)
[facility] PROGRESS NOTE  Katie Mosley [id number] DOB: 07-29-1941 DOA: 08/07/2015 PCP: Philis Fendt, MD  Assessment/Plan: #1 acute encephalopathy Unknown etiology. Likely metabolic encephalopathy likely secondary to adrenal insufficiency. Clinical improvement and close to baseline. Patient with some electrolyte derangements and history of recently diagnosed adrenal insufficiency. Patient was placed on IV Solu-Cortef every 8 hours with improvement with electrolyte derangements. TSH and free T4 were elevated however, improved from October 2016. Continue oral dose Cortef. Continue Synthroid. Will likely need to follow-up with her endocrinologist as outpatient. Follow.  #2 shortness of breath Patient with some complaints of shortness of breath. Patient noted to have left basilar crackles on examination. Patient was +2 L during this hospitalization. Patient was not on her diuretics. Patient with clinical improvement  After being started on IV Lasix. Cardiac enzymes minimally elevated and plateaued. Repeat EKG  pending. Patient with recent 2-D echo on 02/13/2015 with normal EF and no wall motion abnormalities. Repeat chest x-ray consistent with volume overload. Continue IV Lasix 20 mg every 12 hours. Follow.  #3 hypertensive crisis Improved. Saline lock IV fluids. Continue current regimen of Norvasc,imdur, Toprol-XL.  #4 hypothyroidism TSH was elevated at 9.81 from 11.92 on 06/08/2015. Continue current dose of Synthroid. Outpatient follow-up with her endocrinologist.  #5 adrenal insufficiency Continue oral home dose Cortef.  #6 probable acute on chronic diastolic CHF See problem #2. Patient with clinical improvement with diuresis. Patient denies any chest pain. EKG on admission unremarkable. Repeat EKG.  Cardiac enzymes minimally elevated at 0.06. 2-D echo from 02/13/2015 with EF of 55-60% with no wall motion abnormalities.   Patient is negative 580 mL over the past 24 hours with a  urine output of 1300 mL over 24 hours.Continue IV Lasix. Strict I's and O's. Daily weights. Follow.  #7 acute kidney injury Improving now with diuresis. Monitor closely with diuresis.  #8 Constipation Sorbitol ordered, however no BM. Will give soap suds enema.  #9 prophylaxis Heparin for DVT prophylaxis.  Code Status: Full Family Communication: Updated patient and family at bedside. Disposition Plan: Home when medically stable and off IV medications and improvement with her volume overload. Hopefully 1-2 days.   Consultants:  None  Procedures:  CT head 08/07/2015  Chest x-ray 08/10/2015, 08/07/2015  MRI head 08/08/2015    Antibiotics:  None  HPI/Subjective: Patient states shortness of breath improved. Patient denies any chest pain. Patient c/o constipation.  Objective: Filed Vitals:   08/12/15 0403 08/12/15 0903  BP: 157/66 176/62  Pulse: 65 65  Temp: 97.5 F (36.4 C) 98 F (36.7 C)  Resp: 14 16    Intake/Output Summary (Last 24 hours) at 08/12/15 1359 Last data filed at 08/12/15 1241  Gross per 24 hour  Intake    600 ml  Output   2800 ml  Net  -2200 ml   Filed Weights   08/10/15 2137 08/11/15 0500 08/11/15 2115  Weight: 55.3 kg (121 lb 14.6 oz) 55.3 kg (121 lb 14.6 oz) 55.792 kg (123 lb)    Exam:   General:  NAD  Cardiovascular: RRR. + JVD  Respiratory: Minimal basilar crackles.  Abdomen: Soft, nontender, nondistended, positive bowel sounds.  Musculoskeletal: No clubbing cyanosis. Trace-1 + BLE edema.  Data Reviewed: Basic Metabolic Panel:  Recent Labs Lab 08/08/15 0523 08/09/15 0545 08/10/15 0827 08/11/15 0732 08/12/15 0117  NA 129* 135 138 137 136  K 5.4* 4.6 4.3 3.7 3.3*  CL 97* 106 112* 106 102  CO2 23 18* 21* 22 27  GLUCOSE 102*  125* 115* 108* 95  BUN 36* 28* 37* 33* 33*  CREATININE 1.82* 1.23* 1.24* 1.12* 1.03*  CALCIUM 9.7 9.2 9.0 8.8* 8.5*  MG  --   --  1.9  --   --    Liver Function Tests:  Recent Labs Lab  08/07/15 2315 08/08/15 0523  AST 93* 90*  ALT 61* 58*  ALKPHOS 110 104  BILITOT 0.7 1.1  PROT 7.0 6.9  ALBUMIN 3.1* 3.0*   No results for input(s): LIPASE, AMYLASE in the last 168 hours.  Recent Labs Lab 08/08/15 0403  AMMONIA 25   CBC:  Recent Labs Lab 08/07/15 2315  08/08/15 0523 08/09/15 0545 08/10/15 0827 08/11/15 0732 08/12/15 0117  WBC 3.4*  --  4.2 4.1 5.5 5.0 6.5  NEUTROABS 2.3  --   --   --   --   --   --   HGB 11.4*  < > 10.9* 10.0* 8.3* 8.1* 8.2*  HCT 32.9*  < > 32.2* 29.4* 24.8* 24.0* 24.5*  MCV 86.4  --  87.0 87.2 88.6 88.9 87.8  PLT 246  --  243 216 190 201 215  < > = values in this interval not displayed. Cardiac Enzymes:  Recent Labs Lab 08/11/15 1925 08/12/15 0117  TROPONINI 0.06* 0.06*   BNP (last 3 results)  Recent Labs  02/13/15 0115 06/08/15 1724 08/07/15 2315  BNP 472.1* 375.5* 395.8*    ProBNP (last 3 results) No results for input(s): PROBNP in the last 8760 hours.  CBG:  Recent Labs Lab 08/07/15 2332  GLUCAP 114*    Recent Results (from the past 240 hour(s))  Culture, blood (routine x 2)     Status: None (Preliminary result)   Collection Time: 08/08/15  3:58 AM  Result Value Ref Range Status   Specimen Description BLOOD RIGHT FOREARM  Final   Special Requests BOTTLES DRAWN AEROBIC AND ANAEROBIC 5CC   Final   Culture NO GROWTH 4 DAYS  Final   Report Status PENDING  Incomplete  Culture, blood (routine x 2)     Status: None (Preliminary result)   Collection Time: 08/08/15  4:03 AM  Result Value Ref Range Status   Specimen Description BLOOD RIGHT HAND  Final   Special Requests BOTTLES DRAWN AEROBIC AND ANAEROBIC 5CC   Final   Culture NO GROWTH 4 DAYS  Final   Report Status PENDING  Incomplete     Studies: No results found.  Scheduled Meds: . amLODipine  5 mg Oral Daily  . furosemide  20 mg Intravenous Q12H  . heparin  5,000 Units Subcutaneous 3 times per day  . hydrocortisone  10 mg Oral q morning - 10a  .  hydrocortisone  20 mg Oral QHS  . isosorbide mononitrate  30 mg Oral Q1200  . latanoprost  1 drop Left Eye QHS  . levothyroxine  50 mcg Oral QAC breakfast  . metoprolol succinate  50 mg Oral Daily  . pantoprazole  20 mg Oral Daily  . sodium chloride  3 mL Intravenous Q12H  . sodium polystyrene  30 g Oral Once  . sodium polystyrene  30 g Oral Once  . sorbitol  30 mL Oral Once   Continuous Infusions:    Principal Problem:   Altered mental status Active Problems:   Hyponatremia   Benign essential HTN   Hypothyroidism   Congestive heart disease (HCC)   Hypothermia   Secondary adrenal insufficiency (HCC)   AKI (acute kidney injury) ([place])   Acute encephalopathy  Acute on chronic diastolic heart failure ([place])    Time spent: 88 minutes    THOMPSON,DANIEL M.D. [facility] Pager [phone]. If 7PM-7AM, please contact night-coverage at [email or web address], password [facility] 08/12/2015, 1:59 PM  LOS: 4 days

## 2015-08-13 DIAGNOSIS — T68XXXD Hypothermia, subsequent encounter: Secondary | ICD-10-CM

## 2015-08-13 LAB — BASIC METABOLIC PANEL
ANION GAP: 7 (ref 5–15)
BUN: 36 mg/dL — AB (ref 6–20)
CALCIUM: 8.4 mg/dL — AB (ref 8.9–10.3)
CO2: 28 mmol/L (ref 22–32)
CREATININE: 1.23 mg/dL — AB (ref 0.44–1.00)
Chloride: 102 mmol/L (ref 101–111)
GFR calc Af Amer: 49 mL/min — ABNORMAL LOW (ref >60)
GFR calc non Af Amer: 42 mL/min — ABNORMAL LOW (ref >60)
GLUCOSE: 84 mg/dL (ref 65–99)
Potassium: 3.7 mmol/L (ref 3.5–5.1)
Sodium: 137 mmol/L (ref 135–145)

## 2015-08-13 LAB — CULTURE, BLOOD (ROUTINE X 2)
CULTURE: NO GROWTH
CULTURE: NO GROWTH

## 2015-08-13 MED ORDER — FUROSEMIDE 20 MG PO TABS: 20.0000 mg | ORAL_TABLET | Freq: Every day | ORAL | Status: DC

## 2015-08-13 MED ORDER — POTASSIUM CHLORIDE CRYS ER 20 MEQ PO TBCR: 40.0000 meq | EXTENDED_RELEASE_TABLET | Freq: Once | ORAL | Status: DC

## 2015-08-13 MED ORDER — FUROSEMIDE 20 MG PO TABS
20.0000 mg | ORAL_TABLET | Freq: Every day | ORAL | Status: DC
  Administered 2015-08-13: 20 mg via ORAL
  Filled 2015-08-13: qty 1

## 2015-08-13 NOTE — Progress Notes (Addendum)
Patient has had three bowel movements, no distress or pain. Grand daughter at bedside, call light within reach.No need for  enema.

## 2015-08-13 NOTE — Discharge Summary (Signed)
Physician Discharge Summary  APRILE FAGNANI [id number] DOB: 04-20-1941 DOA: 08/07/2015  PCP: Philis Fendt, MD  Admit date: 08/07/2015 Discharge date: 08/13/2015  Time spent: 65 minutes  Recommendations for Outpatient Follow-up:  1. Follow-up with AVBUERE,EDWIN A, MDIn 1 week. On follow-up patient needs a basic metabolic profile done to follow-up on electrolytes and renal function. Patient has been placed on Lasix 20 mg daily. PCP will need to assess to see whether patient needs daily oral potassium supplementation. 2.  Follow-up with Dr. Quay Burow of endocrinology as previously scheduled.   Discharge Diagnoses:  Principal Problem:   Altered mental status Active Problems:   Hyponatremia   Benign essential HTN   Hypothyroidism   Congestive heart disease (HCC)   Hypothermia   Secondary adrenal insufficiency (HCC)   AKI (acute kidney injury) ([place])   Acute encephalopathy   Acute on chronic diastolic heart failure (HCC)   Constipation   Discharge Condition: Stable and improved  Diet recommendation: Heart healthy  Filed Weights   08/11/15 0500 08/11/15 2115 08/12/15 2102  Weight: 55.3 kg (121 lb 14.6 oz) 55.792 kg (123 lb) 55.7 kg (122 lb 12.7 oz)    History of present illness:  Per Dr Keane Police is a 74 y.o. female with a past medical history significant for HTN, hypothyroidism, chronic diastolic CHF and recently diagnosed 2ary adrenal insufficiency who presented with obtundation again.  Two months ago, the patient was clinically well when she developed obtundation rapidly over a day or so. 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 During that hospitalization, evidently the patient came back to her normal state of consciousness within about two days with fluids and stress dose steroids, and was diagnosed with what  appeared to be secondary adrenal insufficiency with Cosyntropin stim test. She was discharged on Cortef tapered down to 20 mg in the morning and 10 mg at night, which she has been taking without event for two months. Of note, the patient had had an MRA at 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 Evidently, the patient has now again, out of the blue, developed increasing weakness and dyspnea over 2-3 days and then become obtunded today.   In the ED, she had a rectal temperature of 41F which rose with warming blanket and then stayed normal. She was hypertensive and obtunded. The glucose was normal. There were no missed doses of medicines, and no focal neurological signs. A noncontrasted CT head was unremarkable. The patient had a new AKI, with serum creatinine 1.9 mg/dL from 0.9 mg/dL previously. The sodium was back down to 126 mmol/L from normal at discharge from [facility] The potassium was elevated at 5.9 mmol/L, and there was even a mild transaminitis.  The case was discussed with endocrinology faculty, Dr. Quay Burow, at [facility], who recommended collection of ACTH and cortisol, TSH/T4, and aldosterone and PRA before administering Solu-cortef 50 mg IV q8hrs.     Hospital Course:  #1 acute encephalopathy Unknown etiology. Likely metabolic encephalopathy likely secondary to adrenal insufficiency. Clinical improvement and close to baseline. Patient with some electrolyte derangements and history of recently diagnosed adrenal insufficiency. Patient was placed on IV Solu-Cortef every 8 hours  with improvement with electrolyte derangements. TSH and free T4 were elevated however, improved from October 2016. Patient was subsequently transitioned from IV  Solu-Cortef down to his home regimen of oral Cortef. Patient was maintained on home regimen of Synthroid which are need to be followed up upon per his endocrinologist. Outpatient follow-up.   #2 shortness of breath Patient with some complaints of shortness of breath during the hospitalization. See problem #6.Marland Kitchen Patient noted to have left basilar crackles on examination. Patient was +2 L during this hospitalization. Patient was not on her diuretics. Patient with clinical improvement After being started on IV Lasix. Cardiac enzymes minimally elevated and plateaued. Repeat EKG pending. Patient with recent 2-D echo on 02/13/2015 with normal EF and no wall motion abnormalities. Repeat chest x-ray consistent with volume overload. Patient improved clinically transitioned to oral Lasix which will be discharged home on. Close follow-up with PCP.   #3 hypertensive crisis Improved. Saline lock IV fluids. Continue current regimen of Norvasc,imdur, Toprol-XL.  #4 hypothyroidism TSH was elevated at 9.81 from 11.92 on 06/08/2015. Continue current dose of Synthroid. Outpatient follow-up with her endocrinologist.  #5 adrenal insufficiency Patient on admission was placed on stress dose IV Solu-Cortef. As patient improved clinically patient was subsequently transitioned back to her home regimen of oral Cortef. Outpatient follow-up.   #6 probable acute on chronic diastolic CHF See problem #2. Patient Janoski catheterization at some complaints of shortness of breath. It was for patient was likely volume overloaded. Patient denied any chest pain. EKG on admission unremarkable. Repeat EKG. Cardiac enzymes minimally elevated at 0.06. 2-D echo from 02/13/2015 with EF of 55-60% with no wall motion abnormalities. Patient is negative 563 mL over the past 24 hours with a urine output of 2550 mL over 24 hours. IV diuretics were subsequently transitioned to oral Lasix at 20 mg daily. Patient improved clinically over discharged  in stable and improved condition. Patient be discharged on a daily dose of Lasix and needs to f/u with PCP as outpatient.   #7 acute kidney injury Improved now with diuresis. Monitor closely with diuresis. Will transition to oral home dose Lasix 20 mg daily.   #8 Constipation Sorbitol ordered 2 times with eventual BM.  #9 prophylaxis Heparin for DVT prophylaxis.   Procedures:  CT head 08/07/2015  Chest x-ray 08/10/2015, 08/07/2015  MRI head 08/08/2015  Consultations:  None  Discharge Exam: Filed Vitals:   08/13/15 0518 08/13/15 0816  BP: 128/47 159/48  Pulse: 60 58  Temp: 97.7 F (36.5 C) 97.5 F (36.4 C)  Resp: 16 17    General: NAD Cardiovascular: RRR Respiratory: CTAB  Discharge Instructions   Discharge Instructions    Diet - low sodium heart healthy    Complete by:  As directed      Discharge instructions    Complete by:  As directed   Follow up with AVBUERE,EDWIN A, MD in 1 week. Follow up with Dr Quay Burow, endocrinologists as scheduled.     Increase activity slowly    Complete by:  As directed           Current Discharge Medication List    CONTINUE these medications which have CHANGED   Details  furosemide (LASIX) 20 MG tablet Take 1 tablet (20 mg total) by mouth daily. Qty: 30 tablet, Refills: 2      CONTINUE these medications which have NOT CHANGED   Details  acetaminophen (TYLENOL) 325 MG tablet Take 325 mg by mouth every 6 (six) hours as needed for mild  pain, moderate pain or headache.    amLODipine (NORVASC) 5 MG tablet Take 1 tablet (5 mg total) by mouth daily. Qty: 30 tablet, Refills: 0    hydrocortisone (CORTEF) 5 MG tablet Take 10-20 mg by mouth 2 (two) times daily. 20mg  in the morning and 10mg  in the evening.    isosorbide mononitrate (IMDUR) 60 MG 24 hr tablet Take 30 mg by mouth daily at 12 noon.     lansoprazole (PREVACID) 30 MG capsule Take 30 mg by mouth daily as needed (gas).     latanoprost (XALATAN) 0.005 % ophthalmic  solution Place 1 drop into the left eye at bedtime as needed (glaucoma).     levothyroxine (SYNTHROID, LEVOTHROID) 50 MCG tablet Take 1 tablet (50 mcg total) by mouth daily before breakfast. Qty: 30 tablet, Refills: 0    metoprolol succinate (TOPROL-XL) 50 MG 24 hr tablet Take 50 mg by mouth daily. Refills: 2    NASONEX 50 MCG/ACT nasal spray Place 2 sprays into both nostrils daily as needed (congestion, rhinitis).  Refills: 5    PROAIR HFA 108 (90 BASE) MCG/ACT inhaler Inhale 2 puffs into the lungs 4 (four) times daily as needed for shortness of breath. Shortness of breath Refills: 5    Vitamin D, Ergocalciferol, (DRISDOL) 50000 UNITS CAPS capsule Take 50,000 Units by mouth once a week. saturday Refills: 5       No Known Allergies Follow-up Information    Follow up with AVBUERE,EDWIN A, MD. Schedule an appointment as soon as possible for a visit in 1 week.   Specialty:  Internal Medicine   Contact information:   [place] [id number] [phone]       Follow up with BURNS, CYNTHIA A., MD.   Specialty:  Internal Medicine   Why:  f/u as scheduled.   Contact information:   [place] [address] [phone]        The results of significant diagnostics from this hospitalization (including imaging, microbiology, ancillary and laboratory) are listed below for reference.    Significant Diagnostic Studies: Ct Head Wo Contrast  08/07/2015  CLINICAL DATA:  Acute onset of altered mental status. Hypothermia. Initial encounter. EXAM: CT HEAD WITHOUT CONTRAST TECHNIQUE: Contiguous axial images were obtained from the base of the skull through the vertex without intravenous contrast. COMPARISON:  CT of the head performed 06/08/2015 FINDINGS: There is no evidence of acute infarction, mass lesion, or intra- or extra-axial hemorrhage on CT. Prominence of the ventricles and sulci reflects mild cortical volume loss. Mild periventricular white matter  change likely reflects small vessel ischemic microangiopathy. A small chronic lacunar infarct is noted at the right basal ganglia. The brainstem and fourth ventricle are within normal limits. The cerebral hemispheres demonstrate grossly normal gray-white differentiation. No mass effect or midline shift is seen. There is no evidence of fracture; visualized osseous structures are unremarkable in appearance. The visualized portions of the orbits are within normal limits. The paranasal sinuses and mastoid air cells are well-aerated. No significant soft tissue abnormalities are seen. IMPRESSION: 1. No acute intracranial pathology seen on CT. 2. Mild cortical volume loss and scattered small vessel ischemic microangiopathy. Small chronic lacunar infarct at the right basal ganglia. Electronically Signed   By: Garald Balding M.D.   On: 08/07/2015 23:01   Mr Brain Wo Contrast  08/08/2015  CLINICAL DATA:  Initial evaluation for acute encephalopathy. EXAM: MRI HEAD WITHOUT CONTRAST TECHNIQUE: Multiplanar, multiecho pulse sequences of the brain and surrounding structures  were obtained without intravenous contrast. COMPARISON:  Error head CT from 08/07/2015. FINDINGS: Mild diffuse prominence of the CSF containing spaces is compatible with generalized age-related cerebral atrophy. Patchy T2/FLAIR hyperintensity within the periventricular and deep white matter both cerebral hemispheres most consistent with chronic small vessel ischemic disease, fairly mild for patient age. A small remote lacunar infarct present within the body of the right caudate. No abnormal foci of restricted diffusion to suggest acute intracranial infarct. Gray-white matter differentiation maintained. Normal intravascular flow voids are preserved. No acute or chronic intracranial hemorrhage. No mass lesion, midline shift, or mass effect. No hydrocephalus. No extra-axial fluid collection. Craniocervical junction within normal limits. Degenerative  spondylolysis noted at the C4-5 level with resultant mild stenosis. Pituitary gland normal. No acute abnormality about the orbits. Sequela prior bilateral lens extraction noted. Mild mucosal thickening within the ethmoidal air cells and maxillary sinuses. Mild opacity within the inferior right mastoid air cells. Inner ear structures normal. Bone marrow signal intensity within normal limits. No scalp soft tissue abnormality IMPRESSION: 1. No acute intracranial process. 2. Mild age-related cerebral atrophy with chronic small vessel ischemic disease. 3. Small remote lacunar infarct involving the right caudate nucleus. Electronically Signed   By: Jeannine Boga M.D.   On: 08/08/2015 23:08   Dg Chest Port 1 View  08/10/2015  CLINICAL DATA:  Shortness of breath, hypertension, CHF. EXAM: PORTABLE CHEST 1 VIEW COMPARISON:  08/07/2015 and 06/08/2015 FINDINGS: Lungs are adequately inflated and demonstrate worsening patchy perihilar opacification which may be due to worsening vascular congestion although cannot exclude developing infection. Opacification over the left costophrenic angle which may represent a small amount left pleural fluid. Stable cardiomegaly. Mild calcified plaque over the aortic arch. Remainder of the exam is unchanged. IMPRESSION: Interval worsening patchy bilateral perihilar opacification which may be due to worsening vascular congestion versus infection. Possible small amount left pleural fluid. Stable cardiomegaly. Electronically Signed   By: Marin Olp M.D.   On: 08/10/2015 12:44   Dg Chest Port 1 View  08/07/2015  CLINICAL DATA:  Altered mental status.  No chest complaints. EXAM: PORTABLE CHEST 1 VIEW COMPARISON:  06/08/2015 FINDINGS: Mild cardiac enlargement with mild vascular congestion. No edema or consolidation. Mild atelectasis in the lung bases. Vague nodular opacity in the right mid lung measures 10 mm diameter. This likely corresponds to fluid in the minor fissure seen on  previous CT chest. No blunting of costophrenic angles. No pneumothorax. Calcification of the aorta. IMPRESSION: Cardiac enlargement with mild vascular congestion. No edema or consolidation. Atelectasis in the lung bases. Possible fluid in the minor fissure on the right. Electronically Signed   By: Lucienne Capers M.D.   On: 08/07/2015 22:50    Microbiology: Recent Results (from the past 240 hour(s))  Culture, blood (routine x 2)     Status: None   Collection Time: 08/08/15  3:58 AM  Result Value Ref Range Status   Specimen Description BLOOD RIGHT FOREARM  Final   Special Requests BOTTLES DRAWN AEROBIC AND ANAEROBIC 5CC   Final   Culture NO GROWTH 5 DAYS  Final   Report Status 08/13/2015 FINAL  Final  Culture, blood (routine x 2)     Status: None   Collection Time: 08/08/15  4:03 AM  Result Value Ref Range Status   Specimen Description BLOOD RIGHT HAND  Final   Special Requests BOTTLES DRAWN AEROBIC AND ANAEROBIC 5CC   Final   Culture NO GROWTH 5 DAYS  Final   Report Status 08/13/2015 FINAL  Final     Labs: Basic Metabolic Panel:  Recent Labs Lab 08/09/15 0545 08/10/15 0827 08/11/15 0732 08/12/15 0117 08/13/15 0310  NA 135 138 137 136 137  K 4.6 4.3 3.7 3.3* 3.7  CL 106 112* 106 102 102  CO2 18* 21* 22 27 28   GLUCOSE 125* 115* 108* 95 84  BUN 28* 37* 33* 33* 36*  CREATININE 1.23* 1.24* 1.12* 1.03* 1.23*  CALCIUM 9.2 9.0 8.8* 8.5* 8.4*  MG  --  1.9  --   --   --    Liver Function Tests:  Recent Labs Lab 08/07/15 2315 08/08/15 0523  AST 93* 90*  ALT 61* 58*  ALKPHOS 110 104  BILITOT 0.7 1.1  PROT 7.0 6.9  ALBUMIN 3.1* 3.0*   No results for input(s): LIPASE, AMYLASE in the last 168 hours.  Recent Labs Lab 08/08/15 0403  AMMONIA 25   CBC:  Recent Labs Lab 08/07/15 2315  08/08/15 0523 08/09/15 0545 08/10/15 0827 08/11/15 0732 08/12/15 0117  WBC 3.4*  --  4.2 4.1 5.5 5.0 6.5  NEUTROABS 2.3  --   --   --   --   --   --   HGB 11.4*  < > 10.9* 10.0*  8.3* 8.1* 8.2*  HCT 32.9*  < > 32.2* 29.4* 24.8* 24.0* 24.5*  MCV 86.4  --  87.0 87.2 88.6 88.9 87.8  PLT 246  --  243 216 190 201 215  < > = values in this interval not displayed. Cardiac Enzymes:  Recent Labs Lab 08/11/15 1925 08/12/15 0117  TROPONINI 0.06* 0.06*   BNP: BNP (last 3 results)  Recent Labs  02/13/15 0115 06/08/15 1724 08/07/15 2315  BNP 472.1* 375.5* 395.8*    ProBNP (last 3 results) No results for input(s): PROBNP in the last 8760 hours.  CBG:  Recent Labs Lab 08/07/15 2332  GLUCAP 114*       Signed:  Yoni Lobos MD [facility] 08/13/2015, 1:12 PM

## 2015-10-28 ENCOUNTER — Emergency Department (HOSPITAL_COMMUNITY): Payer: Medicare Other

## 2015-10-28 ENCOUNTER — Encounter (HOSPITAL_COMMUNITY): Payer: Self-pay | Admitting: *Deleted

## 2015-10-28 ENCOUNTER — Inpatient Hospital Stay (HOSPITAL_COMMUNITY)
Admission: EM | Admit: 2015-10-28 | Discharge: 2015-11-05 | DRG: 292 | Disposition: A | Discharge status: Home Health | Payer: Medicare Other | Attending: Internal Medicine | Admitting: Internal Medicine

## 2015-10-28 DIAGNOSIS — R680 Hypothermia, not associated with low environmental temperature: Secondary | ICD-10-CM | POA: Diagnosis present

## 2015-10-28 DIAGNOSIS — I5033 Acute on chronic diastolic (congestive) heart failure: Secondary | ICD-10-CM | POA: Diagnosis present

## 2015-10-28 DIAGNOSIS — E2749 Other adrenocortical insufficiency: Secondary | ICD-10-CM | POA: Diagnosis present

## 2015-10-28 DIAGNOSIS — Z7982 Long term (current) use of aspirin: Secondary | ICD-10-CM

## 2015-10-28 DIAGNOSIS — J45909 Unspecified asthma, uncomplicated: Secondary | ICD-10-CM | POA: Diagnosis present

## 2015-10-28 DIAGNOSIS — I272 Other secondary pulmonary hypertension: Secondary | ICD-10-CM | POA: Diagnosis present

## 2015-10-28 DIAGNOSIS — I10 Essential (primary) hypertension: Secondary | ICD-10-CM | POA: Diagnosis not present

## 2015-10-28 DIAGNOSIS — N179 Acute kidney failure, unspecified: Secondary | ICD-10-CM | POA: Diagnosis not present

## 2015-10-28 DIAGNOSIS — E039 Hypothyroidism, unspecified: Secondary | ICD-10-CM | POA: Diagnosis present

## 2015-10-28 DIAGNOSIS — T502X5A Adverse effect of carbonic-anhydrase inhibitors, benzothiadiazides and other diuretics, initial encounter: Secondary | ICD-10-CM | POA: Diagnosis present

## 2015-10-28 DIAGNOSIS — T68XXXA Hypothermia, initial encounter: Secondary | ICD-10-CM | POA: Diagnosis present

## 2015-10-28 DIAGNOSIS — K219 Gastro-esophageal reflux disease without esophagitis: Secondary | ICD-10-CM | POA: Diagnosis present

## 2015-10-28 DIAGNOSIS — K5900 Constipation, unspecified: Secondary | ICD-10-CM | POA: Diagnosis present

## 2015-10-28 DIAGNOSIS — J4520 Mild intermittent asthma, uncomplicated: Secondary | ICD-10-CM | POA: Diagnosis not present

## 2015-10-28 DIAGNOSIS — R748 Abnormal levels of other serum enzymes: Secondary | ICD-10-CM | POA: Diagnosis present

## 2015-10-28 DIAGNOSIS — I509 Heart failure, unspecified: Secondary | ICD-10-CM | POA: Diagnosis not present

## 2015-10-28 DIAGNOSIS — R0602 Shortness of breath: Secondary | ICD-10-CM | POA: Diagnosis present

## 2015-10-28 DIAGNOSIS — E871 Hypo-osmolality and hyponatremia: Secondary | ICD-10-CM | POA: Diagnosis present

## 2015-10-28 DIAGNOSIS — I110 Hypertensive heart disease with heart failure: Principal | ICD-10-CM | POA: Diagnosis present

## 2015-10-28 DIAGNOSIS — D72819 Decreased white blood cell count, unspecified: Secondary | ICD-10-CM | POA: Diagnosis present

## 2015-10-28 HISTORY — DX: Headache, unspecified: R51.9

## 2015-10-28 HISTORY — DX: Reserved for inherently not codable concepts without codable children: IMO0001

## 2015-10-28 HISTORY — DX: Headache: R51

## 2015-10-28 LAB — CBC
HEMATOCRIT: 30 % — AB (ref 36.0–46.0)
HEMOGLOBIN: 9.9 g/dL — AB (ref 12.0–15.0)
MCH: 27.3 pg (ref 26.0–34.0)
MCHC: 33 g/dL (ref 30.0–36.0)
MCV: 82.9 fL (ref 78.0–100.0)
Platelets: 301 10*3/uL (ref 150–400)
RBC: 3.62 MIL/uL — AB (ref 3.87–5.11)
RDW: 15 % (ref 11.5–15.5)
WBC: 2 10*3/uL — ABNORMAL LOW (ref 4.0–10.5)

## 2015-10-28 LAB — BASIC METABOLIC PANEL
ANION GAP: 12 (ref 5–15)
BUN: 14 mg/dL (ref 6–20)
CHLORIDE: 84 mmol/L — AB (ref 101–111)
CO2: 27 mmol/L (ref 22–32)
Calcium: 9.3 mg/dL (ref 8.9–10.3)
Creatinine, Ser: 0.83 mg/dL (ref 0.44–1.00)
GFR calc non Af Amer: >60 mL/min (ref >60)
GLUCOSE: 134 mg/dL — AB (ref 65–99)
POTASSIUM: 3.9 mmol/L (ref 3.5–5.1)
Sodium: 123 mmol/L — ABNORMAL LOW (ref 135–145)

## 2015-10-28 LAB — BRAIN NATRIURETIC PEPTIDE: B Natriuretic Peptide: 987.6 pg/mL — ABNORMAL HIGH (ref 0.0–100.0)

## 2015-10-28 LAB — I-STAT TROPONIN, ED: Troponin i, poc: 0.01 ng/mL (ref 0.00–0.08)

## 2015-10-28 LAB — SODIUM, URINE, RANDOM: SODIUM UR: 77 mmol/L

## 2015-10-28 LAB — CREATININE, URINE, RANDOM: Creatinine, Urine: <10 mg/dL

## 2015-10-28 MED ORDER — FUROSEMIDE 10 MG/ML IJ SOLN
40.0000 mg | Freq: Once | INTRAMUSCULAR | Status: AC
  Administered 2015-10-28: 40 mg via INTRAVENOUS
  Filled 2015-10-28: qty 4

## 2015-10-28 NOTE — ED Notes (Signed)
Pt here with son (she speaks only Somali) for sob.  Hx of chf. Increased sob x 1 week.  LE edema bil.

## 2015-10-28 NOTE — ED Provider Notes (Signed)
CSN: [id number]     Arrival date & time 10/28/15  1718 History   First MD Initiated Contact with Patient 10/28/15 2108     Chief Complaint  Patient presents with  . Shortness of Breath     (Consider location/radiation/quality/duration/timing/severity/associated sxs/prior Treatment) HPI Patient prior history of congestive heart failure. The patient's son is the translator in historian. He reports that this has been developing over the past 10 days. He reports this is very similar to the last time that she had congestive heart failure. Shortness of breath has been gradually increasing as well as lower extremity swelling. No chest pain, fever or cough. No abdominal pain vomiting or diarrhea. He reports compliance with all medications. He describes increased Lasix dosing over the past week without any improvement. Past Medical History  Diagnosis Date  . Hypertension   . Hypothyroidism   . HTN (hypertension)   . GERD (gastroesophageal reflux disease)   . Asthma   . Thyroid disease     not know in detail  . CHF (congestive heart failure) ([place])   . Glaucoma   . Adrenal insufficiency ([place])   . Adrenal insufficiency ([place])     Dx'd [facility] 05/2015  . Shortness of breath dyspnea   . Headache    Past Surgical History  Procedure Laterality Date  . Capsulotomy  08/23/2011    Procedure: MINOR CAPSULOTOMY;  Surgeon: Myrtha Mantis.;  Location: [place];  Service: Ophthalmology;  Laterality: Left;  YAG Capsulotomy Left Eye  . Esophagogastroduodenoscopy N/A 03/17/2013    Procedure: ESOPHAGOGASTRODUODENOSCOPY (EGD);  Surgeon: Irene Shipper, MD;  Location[facility] END[facility]COPY;  Service: Endoscopy;  Laterality: N/A;  . Left cataract extraction      2007  . Right cataract extraction      2006  . Appendectomy    . Back surgery     History reviewed. No pertinent family history. Social History  Substance Use Topics  . Smoking status: Never Smoker   . Smokeless tobacco: None  . Alcohol Use: No   OB  History    Gravida Para Term Preterm AB TAB SAB Ectopic Multiple Living   0 0 0 0 0 0 0 0       Review of Systems 10 Systems reviewed and are negative for acute change except as noted in the HPI.    Allergies  Review of patient's allergies indicates no known allergies.  Home Medications   Prior to Admission medications   Medication Sig Start Date End Date Taking? Authorizing Provider  acetaminophen (TYLENOL) 325 MG tablet Take 325 mg by mouth every 6 (six) hours as needed for moderate pain.    Yes Historical Provider, MD  amLODipine (NORVASC) 5 MG tablet Take 1 tablet (5 mg total) by mouth daily. 11/03/14  Yes Eugenie Filler, MD  aspirin EC 81 MG tablet Take 81 mg by mouth daily.   Yes Historical Provider, MD  furosemide (LASIX) 20 MG tablet Take 1 tablet (20 mg total) by mouth daily. 08/13/15  Yes Eugenie Filler, MD  levothyroxine (SYNTHROID, LEVOTHROID) 50 MCG tablet Take 1 tablet (50 mcg total) by mouth daily before breakfast. 02/18/15  Yes Velvet Bathe, MD  metoprolol succinate (TOPROL-XL) 50 MG 24 hr tablet Take 50 mg by mouth daily. 09/05/14  Yes Historical Provider, MD   BP 127/61 mmHg  Pulse 68  Temp(Src) 97.8 F (36.6 C) (Axillary)  Resp 14  Ht 4\' 11"  (1.499 m)  Wt 113 lb 14.4 oz (51.665 kg)  BMI 22.99 kg/m2  SpO2 100% Physical Exam  Constitutional: She is oriented to person, place, and time. She appears well-developed and well-nourished.  HENT:  Head: Normocephalic and atraumatic.  Eyes: EOM are normal. Pupils are equal, round, and reactive to light.  Neck: Neck supple.  Cardiovascular: Normal rate, regular rhythm, normal heart sounds and intact distal pulses.   Pulmonary/Chest: Effort normal.  Fine basal rales. Soft breath sounds at bases.  Abdominal: Soft. Bowel sounds are normal. She exhibits no distension. There is no tenderness.  Musculoskeletal: Normal range of motion. She exhibits edema.  1+ bilateral edema.  Neurological: She is alert and oriented to  person, place, and time. She has normal strength. Coordination normal. GCS eye subscore is 4. GCS verbal subscore is 5. GCS motor subscore is 6.  Skin: Skin is warm, dry and intact.  Psychiatric: She has a normal mood and affect.    ED Course  Procedures (including critical care time) Labs Review Labs Reviewed  BASIC METABOLIC PANEL - Abnormal; Notable for the following:    Sodium 123 (*)    Chloride 84 (*)    Glucose, Bld 134 (*)    All other components within normal limits  CBC - Abnormal; Notable for the following:    WBC 2.0 (*)    RBC 3.62 (*)    Hemoglobin 9.9 (*)    HCT 30.0 (*)    All other components within normal limits  BRAIN NATRIURETIC PEPTIDE - Abnormal; Notable for the following:    B Natriuretic Peptide 987.6 (*)    All other components within normal limits  URINALYSIS, ROUTINE W REFLEX MICROSCOPIC (NOT AT [facility]) - Abnormal; Notable for the following:    Protein, ur 30 (*)    All other components within normal limits  TSH - Abnormal; Notable for the following:    TSH 8.774 (*)    All other components within normal limits  TROPONIN I - Abnormal; Notable for the following:    Troponin I 0.05 (*)    All other components within normal limits  TROPONIN I - Abnormal; Notable for the following:    Troponin I 0.05 (*)    All other components within normal limits  TROPONIN I - Abnormal; Notable for the following:    Troponin I 0.05 (*)    All other components within normal limits  URINE MICROSCOPIC-ADD ON - Abnormal; Notable for the following:    Squamous Epithelial / LPF 0-5 (*)    Bacteria, UA RARE (*)    All other components within normal limits  BRAIN NATRIURETIC PEPTIDE - Abnormal; Notable for the following:    B Natriuretic Peptide 966.0 (*)    All other components within normal limits  MAGNESIUM - Abnormal; Notable for the following:    Magnesium 1.5 (*)    All other components within normal limits  COMPREHENSIVE METABOLIC PANEL - Abnormal; Notable for the  following:    Sodium 124 (*)    Chloride 90 (*)    Calcium 8.6 (*)    Total Protein 5.7 (*)    Albumin 2.8 (*)    All other components within normal limits  OSMOLALITY - Abnormal; Notable for the following:    Osmolality 259 (*)    All other components within normal limits  OSMOLALITY, URINE - Abnormal; Notable for the following:    Osmolality, Ur 198 (*)    All other components within normal limits  CBC WITH DIFFERENTIAL/PLATELET - Abnormal; Notable for the following:  WBC 1.9 (*)    RBC 2.99 (*)    Hemoglobin 8.7 (*)    HCT 24.8 (*)    Neutro Abs 0.6 (*)    All other components within normal limits  TSH - Abnormal; Notable for the following:    TSH 9.036 (*)    All other components within normal limits  T4, FREE - Abnormal; Notable for the following:    Free T4 1.48 (*)    All other components within normal limits  BASIC METABOLIC PANEL - Abnormal; Notable for the following:    Sodium 128 (*)    Chloride 90 (*)    BUN 25 (*)    Creatinine, Ser 1.41 (*)    Calcium 8.6 (*)    GFR calc non Af Amer 35 (*)    GFR calc Af Amer 41 (*)    All other components within normal limits  CBC - Abnormal; Notable for the following:    WBC 2.6 (*)    RBC 3.27 (*)    Hemoglobin 8.9 (*)    HCT 26.9 (*)    All other components within normal limits  BASIC METABOLIC PANEL - Abnormal; Notable for the following:    Sodium 129 (*)    Chloride 91 (*)    BUN 30 (*)    Creatinine, Ser 1.55 (*)    Calcium 8.6 (*)    GFR calc non Af Amer 32 (*)    GFR calc Af Amer 37 (*)    All other components within normal limits  CBC - Abnormal; Notable for the following:    WBC 3.3 (*)    RBC 3.61 (*)    Hemoglobin 9.9 (*)    HCT 29.8 (*)    All other components within normal limits  BASIC METABOLIC PANEL - Abnormal; Notable for the following:    Sodium 129 (*)    Chloride 94 (*)    BUN 34 (*)    Creatinine, Ser 1.48 (*)    Calcium 8.5 (*)    GFR calc non Af Amer 33 (*)    GFR calc Af Amer 39  (*)    All other components within normal limits  CBC - Abnormal; Notable for the following:    WBC 3.5 (*)    RBC 3.42 (*)    Hemoglobin 9.4 (*)    HCT 28.6 (*)    RDW 15.6 (*)    All other components within normal limits  BRAIN NATRIURETIC PEPTIDE - Abnormal; Notable for the following:    B Natriuretic Peptide 497.7 (*)    All other components within normal limits  BASIC METABOLIC PANEL - Abnormal; Notable for the following:    Sodium 131 (*)    Chloride 93 (*)    BUN 39 (*)    Creatinine, Ser 1.50 (*)    Calcium 8.8 (*)    GFR calc non Af Amer 33 (*)    GFR calc Af Amer 38 (*)    All other components within normal limits  BASIC METABOLIC PANEL - Abnormal; Notable for the following:    Sodium 129 (*)    Chloride 92 (*)    BUN 51 (*)    Creatinine, Ser 1.81 (*)    Calcium 8.4 (*)    GFR calc non Af Amer 26 (*)    GFR calc Af Amer 30 (*)    All other components within normal limits  BASIC METABOLIC PANEL - Abnormal; Notable for the following:  Sodium 130 (*)    Chloride 94 (*)    BUN 56 (*)    Creatinine, Ser 1.97 (*)    Calcium 8.4 (*)    GFR calc non Af Amer 24 (*)    GFR calc Af Amer 27 (*)    All other components within normal limits  URINE CULTURE  CULTURE, BLOOD (ROUTINE X 2)  CULTURE, BLOOD (ROUTINE X 2)  MRSA PCR SCREENING  CULTURE, EXPECTORATED SPUTUM-ASSESSMENT  GRAM STAIN  SODIUM, URINE, RANDOM  CREATININE, URINE, RANDOM  CORTISOL  HIV ANTIBODY (ROUTINE TESTING)  STREP PNEUMONIAE URINARY ANTIGEN  LEGIONELLA ANTIGEN, URINE  I-STAT TROPOININ, ED    Imaging Review No results found. I have personally reviewed and evaluated these images and lab results as part of my medical decision-making.   EKG Interpretation   Date/Time:  Friday October 28 2015 17:35:13 EST Ventricular Rate:  51 PR Interval:  208 QRS Duration: 84 QT Interval:  510 QTC Calculation: 470 R Axis:   57 Text Interpretation:  Sinus bradycardia Otherwise normal ECG agree   Confirmed by Johnney Killian, MD, Jeannie Done [phone]) on 10/28/2015 10:33:42 PM     Consult: Hospitalist for admission. MDM   Final diagnoses:  Acute on chronic congestive heart failure, unspecified congestive heart failure type ([place])  Hyponatremia   Patient presents with findings consistent with congestive heart failure. This is despite reported compliance with diuretics. Patient is also significantly hyponatremic. She is nontoxic in appearance and does not show signs of acute secondary infection. Patient will be admitted for congestive heart failure and hyponatremia.   Charlesetta Shanks, MD 11/04/15 [phone]

## 2015-10-28 NOTE — H&P (Signed)
[facility] History and Physical  Katie Mosley [id number] DOB: Dec 17, 1940 DOA: 10/28/2015  Referring physician: Dr. Johnney Killian PCP: Philis Fendt, MD   Chief Complaint: Shortness of breath  HPI: Katie Mosley is a 75 y.o. female  With history of chronic diastolic heart failure, adrenal insufficiency being followed at [facility] however per son lab tests were drawn and patient taken off her daily Cortef, history of hypothyroidism, hypertension, gastroesophageal reflux disease, glaucoma presented to the ED with a ten-day history of worsening shortness of breath, and lower extremity edema. Patient is originally from [place] and does not speak [place] and a such son is translating. Per son patient has had worsening shortness of breath over the past 10 days with lower extremity edema. No palpitations. Patient tried increasing her home dose daily Lasix with not that much significant improvement. Patient denies any chest pain, no fever, no chills, no nausea, no vomiting, no abdominal pain, no diarrhea, no dysuria, no melena, no hematemesis, no hematochezia. No coughing. Patient has been compliant with a low-salt diet. Patient has been compliant with all medications per son. Patient was seen in the emergency room basic metabolic profile obtained had a sodium of 123 chloride of 84 otherwise was within normal limits. BNP was elevated at 987.6. CBC had a white count of 2 hemoglobin of 9.9 otherwise was within normal limits. Point-of-care troponin was negative. Chest x-ray which was done was consistent with vascular congestion and findings of mild interstitial edema versus CHF, right lung base opacity combination of small pleural effusion and consolidative changes/pneumonia. EKG showed a sinus bradycardia. Urinalysis was not done. Patient was also noted to be hypothermic with a temperature of 96.9. Patient was given a dose of IV Lasix. [facility] were called to admit the patient for  further evaluation and management.   Review of Systems: As per history of present illness otherwise negative. Constitutional:  No weight loss, night sweats, Fevers, chills, fatigue.  HEENT:  No headaches, Difficulty swallowing,Tooth/dental problems,Sore throat,  No sneezing, itching, ear ache, nasal congestion, post nasal drip,  Cardio-vascular:  No chest pain, Orthopnea, PND, swelling in lower extremities, anasarca, dizziness, palpitations  GI:  No heartburn, indigestion, abdominal pain, nausea, vomiting, diarrhea, change in bowel habits, loss of appetite  Resp:  No shortness of breath with exertion or at rest. No excess mucus, no productive cough, No non-productive cough, No coughing up of blood.No change in color of mucus.No wheezing.No chest wall deformity  Skin:  no rash or lesions.  GU:  no dysuria, change in color of urine, no urgency or frequency. No flank pain.  Musculoskeletal:  No joint pain or swelling. No decreased range of motion. No back pain.  Psych:  No change in mood or affect. No depression or anxiety. No memory loss.   Past Medical History  Diagnosis Date  . Hypertension   . Hypothyroidism   . HTN (hypertension)   . GERD (gastroesophageal reflux disease)   . Asthma   . Thyroid disease     not know in detail  . CHF (congestive heart failure) ([place])   . Glaucoma   . Adrenal insufficiency ([place])   . Adrenal insufficiency ([place])     Dx'd [facility] 05/2015   Past Surgical History  Procedure Laterality Date  . Capsulotomy  08/23/2011    Procedure: MINOR CAPSULOTOMY;  Surgeon: Myrtha Mantis.;  Location: [place];  Service: Ophthalmology;  Laterality: Left;  YAG Capsulotomy Left Eye  . Esophagogastroduodenoscopy N/A 03/17/2013  Procedure: ESOPHAGOGASTRODUODENOSCOPY (EGD);  Surgeon: Irene Shipper, MD;  Location: [facility] ENDOSCOPY;  Service: Endoscopy;  Laterality: N/A;  . Left cataract extraction      2007  . Right cataract extraction      2006  . Appendectomy      . Back surgery     Social History:  reports that she has never smoked. She does not have any smokeless tobacco history on file. She reports that she does not drink alcohol or use illicit drugs.  No Known Allergies  No family history on file.  Prior to Admission medications   Medication Sig Start Date End Date Taking? Authorizing Provider  acetaminophen (TYLENOL) 325 MG tablet Take 325 mg by mouth every 6 (six) hours as needed for moderate pain.    Yes Historical Provider, MD  amLODipine (NORVASC) 5 MG tablet Take 1 tablet (5 mg total) by mouth daily. 11/03/14  Yes Eugenie Filler, MD  aspirin EC 81 MG tablet Take 81 mg by mouth daily.   Yes Historical Provider, MD  furosemide (LASIX) 20 MG tablet Take 1 tablet (20 mg total) by mouth daily. 08/13/15  Yes Eugenie Filler, MD  levothyroxine (SYNTHROID, LEVOTHROID) 50 MCG tablet Take 1 tablet (50 mcg total) by mouth daily before breakfast. 02/18/15  Yes Velvet Bathe, MD  metoprolol succinate (TOPROL-XL) 50 MG 24 hr tablet Take 50 mg by mouth daily. 09/05/14  Yes Historical Provider, MD   Physical Exam: Filed Vitals:   10/28/15 2207 10/28/15 2300 10/28/15 2319 10/28/15 2330  BP: 160/57 171/79  158/77  Pulse: 51 49  47  Temp:   93.3 F (34.1 C)   TempSrc:   Rectal   Resp: 19 21  23   Weight:      SpO2: 95% 100%  100%    Wt Readings from Last 3 Encounters:  10/28/15 55.792 kg (123 lb)  08/12/15 55.7 kg (122 lb 12.7 oz)  02/18/15 61.916 kg (136 lb 8 oz)    General:  Frail elderly female laying on gurney with covers around her in no acute cardiopulmonary distress. Speaking in full sentences.  Eyes: PERRLA, EOMI, normal lids, irises & conjunctiva ENT: grossly normal hearing, lips & tongue Neck: no LAD, masses or thyromegaly Cardiovascular: RRR, no m/r/g. 1+ bilateral LE edema. Telemetry: SR, no arrhythmias  Respiratory: Decreased breath sounds in the bases. Scattered crackles. No wheezing. Normal respiratory effort. Abdomen: soft,  ntnd, positive bowel sounds, no rebound, no guarding Skin: no rash or induration seen on limited exam Musculoskeletal: 1+ bilateral lower extremity edema. grossly normal tone BUE/BLE Psychiatric: grossly normal mood and affect, speech fluent and appropriate Neurologic: Alert and oriented 3. Cranial nerves II through XII grossly intact. No focal deficits.           Labs on Admission:  Basic Metabolic Panel:  Recent Labs Lab 10/28/15 1846  NA 123*  K 3.9  CL 84*  CO2 27  GLUCOSE 134*  BUN 14  CREATININE 0.83  CALCIUM 9.3   Liver Function Tests: No results for input(s): AST, ALT, ALKPHOS, BILITOT, PROT, ALBUMIN in the last 168 hours. No results for input(s): LIPASE, AMYLASE in the last 168 hours. No results for input(s): AMMONIA in the last 168 hours. CBC:  Recent Labs Lab 10/28/15 1846  WBC 2.0*  HGB 9.9*  HCT 30.0*  MCV 82.9  PLT 301   Cardiac Enzymes: No results for input(s): CKTOTAL, CKMB, CKMBINDEX, TROPONINI in the last 168 hours.  BNP (last 3 results)  Recent Labs  06/08/15 1724 08/07/15 2315 10/28/15 1916  BNP 375.5* 395.8* 987.6*    ProBNP (last 3 results) No results for input(s): PROBNP in the last 8760 hours.  CBG: No results for input(s): GLUCAP in the last 168 hours.  Radiological Exams on Admission: Dg Chest 2 View  10/28/2015  CLINICAL DATA:  75 year old female with shortness of breath. History of asthma and CHF radiograph dated 08/10/2015 EXAM: CHEST  2 VIEW COMPARISON:  None. FINDINGS: Two views of the chest demonstrate central vascular and interstitial prominence which may represent a degree of congestion or interstitial edema. There is a focal area of opacity at the right lung base likely combination of a small pleural effusion and consolidative changes of the adjacent lung. Left lung base interstitial prominence may represent atelectatic changes. Trace left pleural effusion may be present. There is no pneumothorax. There is mild eventration  of the left hemidiaphragm. Mildly enlarged cardiac silhouette. No acute osseous pathology identified. IMPRESSION: Central vascular and interstitial prominence likely mild interstitial edema or congestive changes. Right lung base opacity combination of a small pleural effusion and consolidative changes/pneumonia. Clinical correlation and follow-up recommended. Electronically Signed   By: Anner Crete M.D.   On: 10/28/2015 18:05    EKG: Independently reviewed. Sinus bradycardia  Assessment/Plan Principal Problem:   Acute on chronic diastolic heart failure (HCC) Active Problems:   Shortness of breath   Hypothermia   Hyponatremia   Benign essential HTN   Hypothyroidism   GERD (gastroesophageal reflux disease)   Asthma   Secondary adrenal insufficiency (HCC)   Constipation   Acute exacerbation of CHF (congestive heart failure) (HCC)   #1 acute on chronic diastolic heart failure Patient was presented with a one-week history of worsening shortness of breath, some lower extremity edema and noted to have vascular congestion and mild changes of interstitial edema on chest x-ray. Patient has been compliant with medications and diet. Questionable etiology. Will admit patient to stepdown unit secondary to her hypothermia. Cycle cardiac enzymes every 6 hours 3. Check a TSH. Check a 2-D echo. Check a fasting lipid panel. We'll place on Lasix 40 mg IV every 12 hours. Strict I's and O's. Daily weights. Follow. If cardiac enzymes are elevated oral 2-D echo is abnormal will likely need cardiology consultation.  #2 shortness of breath Likely secondary to problem #1. See problem #1.  #3 hypothermia Questionable etiology. Patient's son does state that patient's Cortef was discontinued by endocrinologist after a test was done. Son describes a test that seems like a cortisol stimulating study. Patient with significant hypothermia repeat temperature is 93.3. Will check a TSH. Check a cortisol level. Check  blood cultures 2. Check a UA with cultures and sensitivities. Chest x-ray with questionable infiltrate in the right lung base however patient does not have a cough or other respiratory symptoms concerning for pneumonia. Place on a bear hugger. If cortisol level is significantly low may need to place on stress dose steroids. Follow for now.  #4 gastroesophageal reflux disease PPI.  #5 hyponatremia Questionable etiology. Per son what seems like a cortisol stimulating study was done in the outpatient setting about a month ago per her endocrinologist and son states that they were told to discontinue daily Cortef. Patient now with significant hyponatremia with a sodium of 123. Will check a urine sodium, urine creatinine, urine osmolality, serum osmolality, cortisol, TSH. Patient also noted to be in volume overload which may be contributing to hypothermia. Will diuresed with IV diuretics. Strict I's and  O's. Daily weights. If cortisol level is abnormal will likely need to be placed on stress dose steroids. Follow.  #6 constipation Placed on a bowel regimen.  #7 hypothyroidism Check a TSH. Continue home dose Synthroid.  #8 hypertension Continue home regimen of Norvasc, Toprol-XL. Patient has also been placed on IV Lasix.  #9 history of secondary adrenal insufficiency Son states that endocrinologist at performed was seen like a cortisol stimulating study in the outpatient setting approximately a month ago and was told to discontinue home regimen of Cortef. Patient now presenting with shortness of breath also hypo-thermic with hyponatremia. Will check a cortisol level. If cortisol level is abnormal we'll place on stress dose steroids.  #10 prophylaxis PPI for GI prophylaxis. Lovenox for DVT prophylaxis.  Code Status: Full DVT Prophylaxis: Lovenox Family Communication: Updated patient and son at bedside. Disposition Plan: Admit to stepdown unit  Time spent: 35 minutes  THOMPSON,DANIEL  M.D. [facility] Pager [phone]

## 2015-10-28 NOTE — ED Notes (Signed)
Spoke with Grandville Silos MD, will apply bear hugger

## 2015-10-29 ENCOUNTER — Encounter (HOSPITAL_COMMUNITY): Payer: Self-pay | Admitting: *Deleted

## 2015-10-29 DIAGNOSIS — T68XXXA Hypothermia, initial encounter: Secondary | ICD-10-CM

## 2015-10-29 DIAGNOSIS — K5900 Constipation, unspecified: Secondary | ICD-10-CM

## 2015-10-29 DIAGNOSIS — R0602 Shortness of breath: Secondary | ICD-10-CM

## 2015-10-29 LAB — CBC WITH DIFFERENTIAL/PLATELET
BASOS ABS: 0 10*3/uL (ref 0.0–0.1)
Basophils Relative: 2 %
Eosinophils Absolute: 0.1 10*3/uL (ref 0.0–0.7)
Eosinophils Relative: 6 %
HCT: 24.8 % — ABNORMAL LOW (ref 36.0–46.0)
HEMOGLOBIN: 8.7 g/dL — AB (ref 12.0–15.0)
LYMPHS PCT: 46 %
Lymphs Abs: 0.9 10*3/uL (ref 0.7–4.0)
MCH: 29.1 pg (ref 26.0–34.0)
MCHC: 35.1 g/dL (ref 30.0–36.0)
MCV: 82.9 fL (ref 78.0–100.0)
MONO ABS: 0.3 10*3/uL (ref 0.1–1.0)
Monocytes Relative: 17 %
NEUTROS PCT: 29 %
Neutro Abs: 0.6 10*3/uL — ABNORMAL LOW (ref 1.7–7.7)
PLATELETS: 235 10*3/uL (ref 150–400)
RBC: 2.99 MIL/uL — ABNORMAL LOW (ref 3.87–5.11)
RDW: 15.3 % (ref 11.5–15.5)
WBC: 1.9 10*3/uL — ABNORMAL LOW (ref 4.0–10.5)

## 2015-10-29 LAB — COMPREHENSIVE METABOLIC PANEL
ALT: 18 U/L (ref 14–54)
AST: 28 U/L (ref 15–41)
Albumin: 2.8 g/dL — ABNORMAL LOW (ref 3.5–5.0)
Alkaline Phosphatase: 73 U/L (ref 38–126)
Anion gap: 10 (ref 5–15)
BUN: 14 mg/dL (ref 6–20)
CHLORIDE: 90 mmol/L — AB (ref 101–111)
CO2: 24 mmol/L (ref 22–32)
CREATININE: 0.74 mg/dL (ref 0.44–1.00)
Calcium: 8.6 mg/dL — ABNORMAL LOW (ref 8.9–10.3)
GFR calc non Af Amer: >60 mL/min (ref >60)
Glucose, Bld: 69 mg/dL (ref 65–99)
Potassium: 3.9 mmol/L (ref 3.5–5.1)
SODIUM: 124 mmol/L — AB (ref 135–145)
Total Bilirubin: 0.5 mg/dL (ref 0.3–1.2)
Total Protein: 5.7 g/dL — ABNORMAL LOW (ref 6.5–8.1)

## 2015-10-29 LAB — STREP PNEUMONIAE URINARY ANTIGEN: STREP PNEUMO URINARY ANTIGEN: NEGATIVE

## 2015-10-29 LAB — URINE MICROSCOPIC-ADD ON
RBC / HPF: NONE SEEN RBC/hpf (ref 0–5)
WBC UA: NONE SEEN WBC/hpf (ref 0–5)

## 2015-10-29 LAB — URINALYSIS, ROUTINE W REFLEX MICROSCOPIC
Bilirubin Urine: NEGATIVE
GLUCOSE, UA: NEGATIVE mg/dL
Hgb urine dipstick: NEGATIVE
Ketones, ur: NEGATIVE mg/dL
LEUKOCYTES UA: NEGATIVE
Nitrite: NEGATIVE
PH: 7 (ref 5.0–8.0)
Protein, ur: 30 mg/dL — AB
Specific Gravity, Urine: 1.005 (ref 1.005–1.030)

## 2015-10-29 LAB — CORTISOL: Cortisol, Plasma: 8.2 ug/dL

## 2015-10-29 LAB — T4, FREE: FREE T4: 1.48 ng/dL — AB (ref 0.61–1.12)

## 2015-10-29 LAB — OSMOLALITY: OSMOLALITY: 259 mosm/kg — AB (ref 275–295)

## 2015-10-29 LAB — TROPONIN I
TROPONIN I: 0.05 ng/mL — AB (ref <0.031)
TROPONIN I: 0.05 ng/mL — AB (ref <0.031)
Troponin I: 0.05 ng/mL — ABNORMAL HIGH (ref <0.031)

## 2015-10-29 LAB — BRAIN NATRIURETIC PEPTIDE: B Natriuretic Peptide: 966 pg/mL — ABNORMAL HIGH (ref 0.0–100.0)

## 2015-10-29 LAB — MRSA PCR SCREENING: MRSA BY PCR: NEGATIVE

## 2015-10-29 LAB — HIV ANTIBODY (ROUTINE TESTING W REFLEX): HIV SCREEN 4TH GENERATION: NONREACTIVE

## 2015-10-29 LAB — OSMOLALITY, URINE: OSMOLALITY UR: 198 mosm/kg — AB (ref 300–900)

## 2015-10-29 LAB — MAGNESIUM: Magnesium: 1.5 mg/dL — ABNORMAL LOW (ref 1.7–2.4)

## 2015-10-29 LAB — TSH
TSH: 9.036 u[IU]/mL — ABNORMAL HIGH (ref 0.350–4.500)
TSH: 8.774 u[IU]/mL — AB (ref 0.350–4.500)

## 2015-10-29 MED ORDER — ACETAMINOPHEN 325 MG PO TABS
650.0000 mg | ORAL_TABLET | ORAL | Status: DC | PRN
  Administered 2015-10-29 – 2015-11-03 (×5): 650 mg via ORAL
  Filled 2015-10-29 (×6): qty 2

## 2015-10-29 MED ORDER — ASPIRIN EC 81 MG PO TBEC
81.0000 mg | DELAYED_RELEASE_TABLET | Freq: Every day | ORAL | Status: DC
Start: 2015-10-29 — End: 2015-11-05
  Administered 2015-10-29 – 2015-11-05 (×8): 81 mg via ORAL
  Filled 2015-10-29 (×8): qty 1

## 2015-10-29 MED ORDER — SODIUM CHLORIDE 0.9% FLUSH
3.0000 mL | Freq: Two times a day (BID) | INTRAVENOUS | Status: DC
  Administered 2015-10-29 – 2015-11-04 (×10): 3 mL via INTRAVENOUS

## 2015-10-29 MED ORDER — ONDANSETRON HCL 4 MG/2ML IJ SOLN: 4.0000 mg | Freq: Four times a day (QID) | INTRAMUSCULAR | Status: DC | PRN

## 2015-10-29 MED ORDER — SODIUM CHLORIDE 0.9% FLUSH: 3.0000 mL | INTRAVENOUS | Status: DC | PRN

## 2015-10-29 MED ORDER — METOLAZONE 5 MG PO TABS
2.5000 mg | ORAL_TABLET | Freq: Every day | ORAL | Status: DC
  Administered 2015-10-29 – 2015-10-30 (×2): 2.5 mg via ORAL
  Filled 2015-10-29 (×2): qty 1

## 2015-10-29 MED ORDER — VANCOMYCIN HCL 500 MG IV SOLR
500.0000 mg | Freq: Two times a day (BID) | INTRAVENOUS | Status: DC
  Administered 2015-10-29 (×2): 500 mg via INTRAVENOUS
  Filled 2015-10-29 (×3): qty 500

## 2015-10-29 MED ORDER — SODIUM CHLORIDE 0.9 % IV SOLN: 250.0000 mL | INTRAVENOUS | Status: DC | PRN

## 2015-10-29 MED ORDER — METOPROLOL SUCCINATE ER 50 MG PO TB24
50.0000 mg | ORAL_TABLET | Freq: Every day | ORAL | Status: DC
  Administered 2015-10-29 – 2015-11-05 (×8): 50 mg via ORAL
  Filled 2015-10-29 (×8): qty 1

## 2015-10-29 MED ORDER — FUROSEMIDE 10 MG/ML IJ SOLN
40.0000 mg | Freq: Two times a day (BID) | INTRAMUSCULAR | Status: DC
  Administered 2015-10-29 – 2015-10-30 (×3): 40 mg via INTRAVENOUS
  Filled 2015-10-29 (×3): qty 4

## 2015-10-29 MED ORDER — ALBUTEROL SULFATE (2.5 MG/3ML) 0.083% IN NEBU: 2.5000 mg | INHALATION_SOLUTION | RESPIRATORY_TRACT | Status: DC | PRN

## 2015-10-29 MED ORDER — ALBUTEROL SULFATE (2.5 MG/3ML) 0.083% IN NEBU
2.5000 mg | INHALATION_SOLUTION | Freq: Four times a day (QID) | RESPIRATORY_TRACT | Status: DC
  Administered 2015-10-29 (×2): 2.5 mg via RESPIRATORY_TRACT
  Filled 2015-10-29 (×2): qty 3

## 2015-10-29 MED ORDER — LEVOTHYROXINE SODIUM 50 MCG PO TABS
62.5000 ug | ORAL_TABLET | Freq: Every day | ORAL | Status: DC
  Administered 2015-10-29: 75 ug via ORAL
  Administered 2015-10-30 – 2015-11-05 (×7): 62.5 ug via ORAL
  Filled 2015-10-29 (×8): qty 1

## 2015-10-29 MED ORDER — PIPERACILLIN-TAZOBACTAM 3.375 G IVPB
3.3750 g | Freq: Three times a day (TID) | INTRAVENOUS | Status: DC
  Administered 2015-10-29: 3.375 g via INTRAVENOUS
  Filled 2015-10-29 (×3): qty 50

## 2015-10-29 MED ORDER — AMLODIPINE BESYLATE 5 MG PO TABS
5.0000 mg | ORAL_TABLET | Freq: Every day | ORAL | Status: DC
  Administered 2015-10-29: 5 mg via ORAL
  Filled 2015-10-29: qty 1

## 2015-10-29 MED ORDER — LEVOTHYROXINE SODIUM 50 MCG PO TABS: 50.0000 ug | ORAL_TABLET | Freq: Every day | ORAL | Status: DC

## 2015-10-29 MED ORDER — LISINOPRIL 5 MG PO TABS: 5.0000 mg | ORAL_TABLET | Freq: Every day | ORAL | Status: DC

## 2015-10-29 MED ORDER — ENOXAPARIN SODIUM 40 MG/0.4ML ~~LOC~~ SOLN
40.0000 mg | Freq: Every day | SUBCUTANEOUS | Status: DC
  Administered 2015-10-29 – 2015-10-30 (×2): 40 mg via SUBCUTANEOUS
  Filled 2015-10-29 (×2): qty 0.4

## 2015-10-29 NOTE — Evaluation (Signed)
Physical Therapy Evaluation Patient Details Name: Katie Mosley MRN: [id number] DOB: Feb 16, 1941 Today's Date: 10/29/2015   History of Present Illness  Patient is a 75 yo female admitted 10/28/15 with SOB and LE edema - CHF exacerbation, and hypothermia.     PMH:  CHF, HTN, asthma, adrenal insufficiency  Clinical Impression  Patient presents with problems listed below.  Will benefit from acute PT to maximize functional independence prior to discharge home with family.  Do not anticipate and f/u PT needs at d/c.    Follow Up Recommendations No PT follow up;Supervision for mobility/OOB    Equipment Recommendations  3in1 (PT)    Recommendations for Other Services       Precautions / Restrictions Precautions Precautions: Fall Restrictions Weight Bearing Restrictions: No      Mobility  Bed Mobility Overal bed mobility: Modified Independent             General bed mobility comments: Use of rail  Transfers Overall transfer level: Needs assistance Equipment used: None Transfers: Sit to/from Stand Sit to Stand: Min assist         General transfer comment: Assist to steady during transfer.  Ambulation/Gait Ambulation/Gait assistance: Min guard Ambulation Distance (Feet): 120 Feet Assistive device: None Gait Pattern/deviations: Step-through pattern;Decreased stride length;Narrow base of support Gait velocity: decreased Gait velocity interpretation: Below normal speed for age/gender General Gait Details: Patient with slow, slightly unsteady gait, with no assistive device used.  Patients O2 sats remained in 90's during gait.  Stairs            Wheelchair Mobility    Modified Rankin (Stroke Patients Only)       Balance                                             Pertinent Vitals/Pain Pain Assessment: No/denies pain   O2 sats remain in 90's with gait on room air.    Home Living Family/patient expects to be discharged to:: Private  residence Living Arrangements: Children Available Help at Discharge: Family;Available 24 hours/day Type of Home: House Home Access: Stairs to enter Entrance Stairs-Rails: Right;Left;Can reach both Entrance Stairs-Number of Steps: 3 Home Layout: One level Home Equipment: Clinical cytogeneticist - 2 wheels      Prior Function Level of Independence: Independent               Hand Dominance        Extremity/Trunk Assessment   Upper Extremity Assessment: Overall WFL for tasks assessed           Lower Extremity Assessment: Generalized weakness      Cervical / Trunk Assessment: Normal  Communication   Communication: Prefers language other than English;Interpreter utilized (Son assisted with interpreting)  Cognition Arousal/Alertness: Awake/alert Behavior During Therapy: WFL for tasks assessed/performed Overall Cognitive Status: Difficult to assess                      General Comments      Exercises        Assessment/Plan    PT Assessment Patient needs continued PT services  PT Diagnosis Abnormality of gait;Generalized weakness   PT Problem List Decreased strength;Decreased balance;Decreased mobility;Decreased knowledge of use of DME  PT Treatment Interventions DME instruction;Gait training;Stair training;Functional mobility training;Therapeutic activities;Therapeutic exercise;Balance training;Patient/family education   PT Goals (Current goals can be found in the Care  Plan section) Acute Rehab PT Goals Patient Stated Goal: None stated PT Goal Formulation: With patient/family Time For Goal Achievement: 11/05/15 Potential to Achieve Goals: Good    Frequency Min 3X/week   Barriers to discharge        Co-evaluation               End of Session Equipment Utilized During Treatment: Gait belt Activity Tolerance: Patient tolerated treatment well Patient left: in bed;with call bell/phone within reach;with bed alarm set;with family/visitor  present           Time: 1345-1402 PT Time Calculation (min) (ACUTE ONLY): 17 min   Charges:   PT Evaluation $PT Eval Moderate Complexity: 1 Procedure     PT G CodesDespina Pole 11/16/2015, 2:53 PM Carita Pian. Sanjuana Kava, [place] Pager [phone]

## 2015-10-29 NOTE — Progress Notes (Signed)
Pt temp 99.2  At 0745, removed part of blankets, lowered room temp with improvement, pt is alert pain free with stable VS at present.  Edward Qualia RN

## 2015-10-29 NOTE — Progress Notes (Addendum)
[facility] PROGRESS NOTE    Progress Note   Katie Mosley [id number] DOB: [birth year] DOA: 10/28/2015 PCP: Philis Fendt, MD   Brief Narrative:   Katie Mosley is an 75 y.o. female past medical history of chronic diastolic heart failure, adrenal insufficiency : [name], her son labs would check and she was taking off her steroids, they comes in for worsening shortness of breath and lower extremity edema for the last several days. The patient is non-English from [place].  Assessment/Plan:   Acute on chronic diastolic heart failure [facility]): Chest x-ray so mild pulmonary edema, with BNP of 1000, with a 2-D echo done on 02/13/2015 that showed an ejection fraction 55% with grade 1 diastolic heart failure and a peak pulmonary pressure of 75 mmHg. She is positive JVD and  woody lower extremity edema on low-dose metolazone continue Lasix IV twice a day. Restrict her fluids low sodium diet strict I's and O's and daily weights. Pt is chest pain free, mild elevation in Troponin's is likely due to HF.  Hypothermia: Patient was previously on steroids, but she's been followed up by an endocrinologist over at [place] according to family test was done and he recommended discontinued her steroids. TSH is mildly elevated at a 0.7 will repeat along with a free T4. Her cortisol level was 8.2 which is within normal, but is unlikely that she is insufficient. UA showed 0-2 white blood cells and I have personally reviewed the chest x-ray she has a shows vascular congestion with right pleural effusion and interstitial infiltrates on the right most likely due to acute decompensated  heart failure. Unclear to etiology.  Hyponatremia: Cortisol all was within normal, she had mildly elevated TSH we will recheck, her urine osmolality was 98 and her serum osmolarity is 258 which points to excess fluid. Question if this is likely due to volume overload.  Benign essential HTN: Seems we  will control continue to monitor.  Hypothyroidism Continue current Synthroid dose, will have to be rechecked as an outpatient in 6 months.  GERD (gastroesophageal reflux disease) PPI.  History of Secondary adrenal insufficiency ([place]): As per family members she was tape[facility] off her steroids [facility].  Constipation: miralax    DVT Prophylaxis - Lovenox ordered.  Family Communication: gransdaughter Disposition Plan: unable to determine Code Status:     Code Status Orders        Start     Ordered   10/29/15 0047  Full code   Continuous     10/29/15 0046    Code Status History    Date Active Date Inactive Code Status Order ID Comments User Context   08/08/2015  4:32 AM 08/13/2015  5:39 PM Full Code [id number]  Edwin Dada, MD Inpatient   02/13/2015  5:52 AM 02/18/2015  3:05 PM Full Code [id number]  Ivor Costa, MD Inpatient   10/31/2014  1:32 PM 11/03/2014  6:20 PM Full Code [id number]  Orson Eva, MD Inpatient        IV Access:    Peripheral IV   Procedures and diagnostic studies:   Dg Chest 2 View  10/28/2015  CLINICAL DATA:  75 year old female with shortness of breath. History of asthma and CHF radiograph dated 08/10/2015 EXAM: CHEST  2 VIEW COMPARISON:  None. FINDINGS: Two views of the chest demonstrate central vascular and interstitial prominence which may represent a degree of congestion or interstitial edema. There is a focal area of opacity at the right lung base  likely combination of a small pleural effusion and consolidative changes of the adjacent lung. Left lung base interstitial prominence may represent atelectatic changes. Trace left pleural effusion may be present. There is no pneumothorax. There is mild eventration of the left hemidiaphragm. Mildly enlarged cardiac silhouette. No acute osseous pathology identified. IMPRESSION: Central vascular and interstitial prominence likely mild interstitial edema or congestive changes. Right lung base opacity  combination of a small pleural effusion and consolidative changes/pneumonia. Clinical correlation and follow-up recommended. Electronically Signed   By: Anner Crete M.D.   On: 10/28/2015 18:05     Medical Consultants:    None.  Anti-Infectives:   Anti-infectives    Start     Dose/Rate Route Frequency Ordered Stop   10/29/15 0300  vancomycin (VANCOCIN) 500 mg in sodium chloride 0.9 % 100 mL IVPB     500 mg 100 mL/hr over 60 Minutes Intravenous Every 12 hours 10/29/15 0246     10/29/15 0300  piperacillin-tazobactam (ZOSYN) IVPB 3.375 g     3.375 g 12.5 mL/hr over 240 Minutes Intravenous 3 times per day 10/29/15 0246        Subjective:    [name] she relates her breathing is improved.  Objective:    Filed Vitals:   10/29/15 0630 10/29/15 0645 10/29/15 0745 10/29/15 0834  BP: 125/53 114/50 126/51   Pulse: 58 57 60 65  Temp:   99.2 F (37.3 C)   TempSrc:   Oral   Resp: 12 12 16 18   Height:      Weight:      SpO2: 100% 100% 100% 98%    Intake/Output Summary (Last 24 hours) at 10/29/15 1004 Last data filed at 10/29/15 0600  Gross per 24 hour  Intake    150 ml  Output    700 ml  Net   -550 ml   Filed Weights   10/28/15 1754 10/29/15 0045 10/29/15 0500  Weight: 55.792 kg (123 lb) 54.069 kg (119 lb 3.2 oz) 53.8 kg (118 lb 9.7 oz)    Exam: Gen:  NAD Cardiovascular:  RRR, JVD to her earlobe. Chest and lungs:   Bilateral crackles in her lungs. Abdomen:  Abdomen soft, NT/ND, + BS Extremities:  Show 3+ woody edema.   Data Reviewed:    Labs: Basic Metabolic Panel:  Recent Labs Lab 10/28/15 1846 10/29/15 0510  NA 123* 124*  K 3.9 3.9  CL 84* 90*  CO2 27 24  GLUCOSE 134* 69  BUN 14 14  CREATININE 0.83 0.74  CALCIUM 9.3 8.6*  MG  --  1.5*   GFR Estimated Creatinine Clearance: 45.5 mL/min (by C-G formula based on Cr of 0.74). Liver Function Tests:  Recent Labs Lab 10/29/15 0510  AST 28  ALT 18  ALKPHOS 73  BILITOT 0.5  PROT  5.7*  ALBUMIN 2.8*   No results for input(s): LIPASE, AMYLASE in the last 168 hours. No results for input(s): AMMONIA in the last 168 hours. Coagulation profile No results for input(s): INR, PROTIME in the last 168 hours.  CBC:  Recent Labs Lab 10/28/15 1846 10/29/15 0510  WBC 2.0* 1.9*  NEUTROABS  --  0.6*  HGB 9.9* 8.7*  HCT 30.0* 24.8*  MCV 82.9 82.9  PLT 301 235   Cardiac Enzymes:  Recent Labs Lab 10/29/15 0001 10/29/15 0510  TROPONINI 0.05* 0.05*   BNP (last 3 results) No results for input(s): PROBNP in the last 8760 hours. CBG: No results for input(s): GLUCAP in the last  168 hours. D-Dimer: No results for input(s): DDIMER in the last 72 hours. Hgb A1c: No results for input(s): HGBA1C in the last 72 hours. Lipid Profile: No results for input(s): CHOL, HDL, LDLCALC, TRIG, CHOLHDL, LDLDIRECT in the last 72 hours. Thyroid function studies:  Recent Labs  10/29/15 0001  TSH 8.774*   Anemia work up: No results for input(s): VITAMINB12, FOLATE, FERRITIN, TIBC, IRON, RETICCTPCT in the last 72 hours. Sepsis Labs:  Recent Labs Lab 10/28/15 1846 10/29/15 0510  WBC 2.0* 1.9*   Microbiology Recent Results (from the past 240 hour(s))  MRSA PCR Screening     Status: None   Collection Time: 10/29/15 12:54 AM  Result Value Ref Range Status   MRSA by PCR NEGATIVE NEGATIVE Final    Comment:        The GeneXpert MRSA Assay (FDA approved for NASAL specimens only), is one component of a comprehensive MRSA colonization surveillance program. It is not intended to diagnose MRSA infection nor to guide or monitor treatment for MRSA infections.      Medications:   . albuterol  2.5 mg Nebulization Q6H  . amLODipine  5 mg Oral Daily  . aspirin EC  81 mg Oral Daily  . enoxaparin (LOVENOX) injection  40 mg Subcutaneous Daily  . furosemide  40 mg Intravenous Q12H  . levothyroxine  62.5 mcg Oral QAC breakfast  . metoprolol succinate  50 mg Oral Daily  .  piperacillin-tazobactam (ZOSYN)  IV  3.375 g Intravenous 3 times per day  . sodium chloride flush  3 mL Intravenous Q12H  . vancomycin  500 mg Intravenous Q12H   Continuous Infusions:   Time spent: 25 min   LOS: 1 day   Charlynne Cousins  [facility] Pager [phone]  *Please refer to [email or web address], password [email or web address] to get updated schedule on who will round on this patient, as hospitalists switch teams weekly. If 7PM-7AM, please contact night-coverage at [email or web address], password [email or web address] for any overnight needs.  10/29/2015, 10:04 AM

## 2015-10-29 NOTE — Progress Notes (Signed)
Pharmacy Antibiotic Note  Katie Mosley is a 75 y.o. female admitted on 10/28/2015 with shortness of breath/hypothermia.  Pharmacy has been consulted for Vancomycin/Zosyn dosing.  CXR with right lung base opacity, WBC is low, starting broad spectrum anti-biotics given degree of hypothermia paired with CXR finding  Plan: -Vancomycin 500 mg IV q12h -Zosyn 3.375G IV q8h to be infused over 4 hours -Trend WBC, temp, renal function  -Drug levels as indicated    Height: 4\' 11"  (149.9 cm) Weight: 119 lb 3.2 oz (54.069 kg) IBW/kg (Calculated) : 43.2  Temp (24hrs), Avg:94.7 F (34.8 C), Min:93.3 F (34.1 C), Max:96.9 F (36.1 C)   Recent Labs Lab 10/28/15 1846  WBC 2.0*  CREATININE 0.83    Estimated Creatinine Clearance: 44 mL/min (by C-G formula based on Cr of 0.83).    No Known Allergies  Katie Mosley 10/29/2015 2:41 AM

## 2015-10-30 ENCOUNTER — Other Ambulatory Visit (HOSPITAL_COMMUNITY): Payer: Self-pay

## 2015-10-30 LAB — CBC
HEMATOCRIT: 26.9 % — AB (ref 36.0–46.0)
HEMOGLOBIN: 8.9 g/dL — AB (ref 12.0–15.0)
MCH: 27.2 pg (ref 26.0–34.0)
MCHC: 33.1 g/dL (ref 30.0–36.0)
MCV: 82.3 fL (ref 78.0–100.0)
Platelets: 261 10*3/uL (ref 150–400)
RBC: 3.27 MIL/uL — AB (ref 3.87–5.11)
RDW: 15.2 % (ref 11.5–15.5)
WBC: 2.6 10*3/uL — ABNORMAL LOW (ref 4.0–10.5)

## 2015-10-30 LAB — BASIC METABOLIC PANEL
ANION GAP: 12 (ref 5–15)
BUN: 25 mg/dL — ABNORMAL HIGH (ref 6–20)
CALCIUM: 8.6 mg/dL — AB (ref 8.9–10.3)
CHLORIDE: 90 mmol/L — AB (ref 101–111)
CO2: 26 mmol/L (ref 22–32)
Creatinine, Ser: 1.41 mg/dL — ABNORMAL HIGH (ref 0.44–1.00)
GFR calc Af Amer: 41 mL/min — ABNORMAL LOW (ref >60)
GFR calc non Af Amer: 35 mL/min — ABNORMAL LOW (ref >60)
GLUCOSE: 83 mg/dL (ref 65–99)
Potassium: 4.1 mmol/L (ref 3.5–5.1)
Sodium: 128 mmol/L — ABNORMAL LOW (ref 135–145)

## 2015-10-30 LAB — URINE CULTURE: Culture: NO GROWTH

## 2015-10-30 MED ORDER — ENOXAPARIN SODIUM 30 MG/0.3ML ~~LOC~~ SOLN
30.0000 mg | Freq: Every day | SUBCUTANEOUS | Status: DC
  Administered 2015-10-31 – 2015-11-04 (×5): 30 mg via SUBCUTANEOUS
  Filled 2015-10-30 (×5): qty 0.3

## 2015-10-30 MED ORDER — FUROSEMIDE 20 MG PO TABS
20.0000 mg | ORAL_TABLET | Freq: Every day | ORAL | Status: DC
  Administered 2015-10-30 – 2015-10-31 (×2): 20 mg via ORAL
  Filled 2015-10-30 (×2): qty 1

## 2015-10-30 NOTE — Progress Notes (Signed)
[facility] PROGRESS NOTE    Progress Note   Katie Mosley [id number] DOB: 1941/05/22 DOA: 10/28/2015 PCP: Philis Fendt, MD   Brief Narrative:   Katie Mosley is an 75 y.o. female past medical history of chronic diastolic heart failure, adrenal insufficiency : Katie Mosley, her son labs would check and she was taking off her steroids, they comes in for worsening shortness of breath and lower extremity edema for the last several days. The patient is non-English from [place].  Assessment/Plan:   Acute on chronic diastolic heart failure [facility]): Now seems to be euvolemic. Will mobilize in her creatinine. Liberalize her fluid restriction. Low sodium diet strict I's and O's and daily weights. Pt is chest pain free, mild elevation in Troponin's is likely due to HF.  Hypothermia: Resolved, Unclear to etiology. TSH cont to be high, will cont need to be recheck in 6 weeks. Her cortisol level was 8.2 which is within normal, but she likely insufficient She will need to follow-up with her endocrinologist as an outpatient.  Hyponatremia: Cortisol all was within normal, seems improving with diuresis, will continue to monitor Question if this is likely due to volume overload.  Benign essential HTN: Seems we will control continue to monitor.  Hypothyroidism Continue current Synthroid dose, will have to be rechecked as an outpatient in 6 months.  GERD (gastroesophageal reflux disease) PPI.  History of Secondary adrenal insufficiency ([place]): As per family members she was [facility] off her [facility]. Follow up with endocrinology as an outpatient.  Constipation: miralax    DVT Prophylaxis - Lovenox ordered.  Family Communication: gransdaughter Disposition Plan: home in 1-2 days Code Status:     Code Status Orders        Start     Ordered   10/29/15 0047  Full code   Continuous     10/29/15 0046    Code Status History    Date Active Date  Inactive Code Status Order ID Comments User Context   08/08/2015  4:32 AM 08/13/2015  5:39 PM Full Code [id number]  Edwin Dada, MD Inpatient   02/13/2015  5:52 AM 02/18/2015  3:05 PM Full Code [id number]  Ivor Costa, MD Inpatient   10/31/2014  1:32 PM 11/03/2014  6:20 PM Full Code [id number]  Orson Eva, MD Inpatient        IV Access:    Peripheral IV   Procedures and diagnostic studies:   Dg Chest 2 View  10/28/2015  CLINICAL DATA:  75 year old female with shortness of breath. History of asthma and CHF radiograph dated 08/10/2015 EXAM: CHEST  2 VIEW COMPARISON:  None. FINDINGS: Two views of the chest demonstrate central vascular and interstitial prominence which may represent a degree of congestion or interstitial edema. There is a focal area of opacity at the right lung base likely combination of a small pleural effusion and consolidative changes of the adjacent lung. Left lung base interstitial prominence may represent atelectatic changes. Trace left pleural effusion may be present. There is no pneumothorax. There is mild eventration of the left hemidiaphragm. Mildly enlarged cardiac silhouette. No acute osseous pathology identified. IMPRESSION: Central vascular and interstitial prominence likely mild interstitial edema or congestive changes. Right lung base opacity combination of a small pleural effusion and consolidative changes/pneumonia. Clinical correlation and follow-up recommended. Electronically Signed   By: Anner Crete M.D.   On: 10/28/2015 18:05     Medical Consultants:    None.  Anti-Infectives:   Anti-infectives  Start     Dose/Rate Route Frequency Ordered Stop   10/29/15 0300  vancomycin (VANCOCIN) 500 mg in sodium chloride 0.9 % 100 mL IVPB  Status:  Discontinued     500 mg 100 mL/hr over 60 Minutes Intravenous Every 12 hours 10/29/15 0246 10/29/15 1019   10/29/15 0300  piperacillin-tazobactam (ZOSYN) IVPB 3.375 g  Status:  Discontinued     3.375 g 12.5  mL/hr over 240 Minutes Intravenous 3 times per day 10/29/15 0246 10/29/15 1019      Subjective:    Katie Mosley she relates her breathing is improved.  Objective:    Filed Vitals:   10/29/15 2002 10/29/15 2359 10/30/15 0400 10/30/15 0931  BP: 121/69 127/58 130/54 123/57  Pulse: 62 62 58 63  Temp: 98.7 F (37.1 C) 98.2 F (36.8 C) 98.3 F (36.8 C)   TempSrc: Oral     Resp: 14 23 14    Height:      Weight:   52.39 kg (115 lb 8 oz)   SpO2: 99% 99% 96%     Intake/Output Summary (Last 24 hours) at 10/30/15 0950 Last data filed at 10/30/15 0941  Gross per 24 hour  Intake    720 ml  Output   2650 ml  Net  -1930 ml   Filed Weights   10/29/15 0045 10/29/15 0500 10/30/15 0400  Weight: 54.069 kg (119 lb 3.2 oz) 53.8 kg (118 lb 9.7 oz) 52.39 kg (115 lb 8 oz)    Exam: Gen:  NAD Cardiovascular:  RRR, JVD  Is resolved. Chest and lungs:  Good air movement with crackles on her left. Abdomen:  Abdomen soft, NT/ND, + BS Extremities:  Trace edema.   Data Reviewed:    Labs: Basic Metabolic Panel:  Recent Labs Lab 10/28/15 1846 10/29/15 0510 10/30/15 0554  NA 123* 124* 128*  K 3.9 3.9 4.1  CL 84* 90* 90*  CO2 27 24 26   GLUCOSE 134* 69 83  BUN 14 14 25*  CREATININE 0.83 0.74 1.41*  CALCIUM 9.3 8.6* 8.6*  MG  --  1.5*  --    GFR Estimated Creatinine Clearance: 25.5 mL/min (by C-G formula based on Cr of 1.41). Liver Function Tests:  Recent Labs Lab 10/29/15 0510  AST 28  ALT 18  ALKPHOS 73  BILITOT 0.5  PROT 5.7*  ALBUMIN 2.8*   No results for input(s): LIPASE, AMYLASE in the last 168 hours. No results for input(s): AMMONIA in the last 168 hours. Coagulation profile No results for input(s): INR, PROTIME in the last 168 hours.  CBC:  Recent Labs Lab 10/28/15 1846 10/29/15 0510 10/30/15 0554  WBC 2.0* 1.9* 2.6*  NEUTROABS  --  0.6*  --   HGB 9.9* 8.7* 8.9*  HCT 30.0* 24.8* 26.9*  MCV 82.9 82.9 82.3  PLT 301 235 261   Cardiac  Enzymes:  Recent Labs Lab 10/29/15 0001 10/29/15 0510 10/29/15 0959  TROPONINI 0.05* 0.05* 0.05*   BNP (last 3 results) No results for input(s): PROBNP in the last 8760 hours. CBG: No results for input(s): GLUCAP in the last 168 hours. D-Dimer: No results for input(s): DDIMER in the last 72 hours. Hgb A1c: No results for input(s): HGBA1C in the last 72 hours. Lipid Profile: No results for input(s): CHOL, HDL, LDLCALC, TRIG, CHOLHDL, LDLDIRECT in the last 72 hours. Thyroid function studies:  Recent Labs  10/29/15 1116  TSH 9.036*   Anemia work up: No results for input(s): VITAMINB12, FOLATE, FERRITIN, TIBC, IRON, RETICCTPCT  in the last 72 hours. Sepsis Labs:  Recent Labs Lab 10/28/15 1846 10/29/15 0510 10/30/15 0554  WBC 2.0* 1.9* 2.6*   Microbiology Recent Results (from the past 240 hour(s))  MRSA PCR Screening     Status: None   Collection Time: 10/29/15 12:54 AM  Result Value Ref Range Status   MRSA by PCR NEGATIVE NEGATIVE Final    Comment:        The GeneXpert MRSA Assay (FDA approved for NASAL specimens only), is one component of a comprehensive MRSA colonization surveillance program. It is not intended to diagnose MRSA infection nor to guide or monitor treatment for MRSA infections.      Medications:   . aspirin EC  81 mg Oral Daily  . enoxaparin (LOVENOX) injection  40 mg Subcutaneous Daily  . furosemide  40 mg Intravenous Q12H  . levothyroxine  62.5 mcg Oral QAC breakfast  . metolazone  2.5 mg Oral Daily  . metoprolol succinate  50 mg Oral Daily  . sodium chloride flush  3 mL Intravenous Q12H   Continuous Infusions:   Time spent: 25 min   LOS: 2 days   Charlynne Cousins  [facility] Pager [phone]  *Please refer to [email or web address], password [email or web address] to get updated schedule on who will round on this patient, as hospitalists switch teams weekly. If 7PM-7AM, please contact night-coverage at [email or web address], password [email or web address] for any  overnight needs.  10/30/2015, 9:50 AM

## 2015-10-30 NOTE — Progress Notes (Signed)
Utilization Review Completed.Deosha Werden T3/12/2015  

## 2015-10-31 ENCOUNTER — Encounter (HOSPITAL_COMMUNITY): Payer: Self-pay | Admitting: *Deleted

## 2015-10-31 ENCOUNTER — Inpatient Hospital Stay (HOSPITAL_COMMUNITY): Payer: Medicare Other

## 2015-10-31 DIAGNOSIS — I509 Heart failure, unspecified: Secondary | ICD-10-CM

## 2015-10-31 LAB — CBC
HEMATOCRIT: 29.8 % — AB (ref 36.0–46.0)
Hemoglobin: 9.9 g/dL — ABNORMAL LOW (ref 12.0–15.0)
MCH: 27.4 pg (ref 26.0–34.0)
MCHC: 33.2 g/dL (ref 30.0–36.0)
MCV: 82.5 fL (ref 78.0–100.0)
PLATELETS: 300 10*3/uL (ref 150–400)
RBC: 3.61 MIL/uL — ABNORMAL LOW (ref 3.87–5.11)
RDW: 15.3 % (ref 11.5–15.5)
WBC: 3.3 10*3/uL — ABNORMAL LOW (ref 4.0–10.5)

## 2015-10-31 LAB — LEGIONELLA ANTIGEN, URINE

## 2015-10-31 LAB — BASIC METABOLIC PANEL
Anion gap: 11 (ref 5–15)
BUN: 30 mg/dL — AB (ref 6–20)
CO2: 27 mmol/L (ref 22–32)
CREATININE: 1.55 mg/dL — AB (ref 0.44–1.00)
Calcium: 8.6 mg/dL — ABNORMAL LOW (ref 8.9–10.3)
Chloride: 91 mmol/L — ABNORMAL LOW (ref 101–111)
GFR calc Af Amer: 37 mL/min — ABNORMAL LOW (ref >60)
GFR, EST NON AFRICAN AMERICAN: 32 mL/min — AB (ref >60)
GLUCOSE: 86 mg/dL (ref 65–99)
POTASSIUM: 4.5 mmol/L (ref 3.5–5.1)
SODIUM: 129 mmol/L — AB (ref 135–145)

## 2015-10-31 MED ORDER — FUROSEMIDE 10 MG/ML IJ SOLN: 20.0000 mg | Freq: Two times a day (BID) | INTRAMUSCULAR | Status: DC

## 2015-10-31 MED ORDER — FUROSEMIDE 10 MG/ML IJ SOLN
20.0000 mg | Freq: Two times a day (BID) | INTRAMUSCULAR | Status: DC
  Administered 2015-10-31 – 2015-11-02 (×4): 20 mg via INTRAVENOUS
  Filled 2015-10-31 (×4): qty 2

## 2015-10-31 NOTE — Plan of Care (Signed)
Problem: Activity: Goal: Capacity to carry out activities will improve Outcome: Completed/Met Date Met:  10/31/15 Pt able to tolerate increased activity   Problem: Education: Goal: Ability to demonstrate managment of disease process will improve Outcome: Completed/Met Date Met:  10/31/15 Pt and family members received education regarding heart failure throughout hospitalization   Problem: Education: Goal: Knowledge of  General Education information/materials will improve Outcome: Completed/Met Date Met:  10/31/15 Pt received education about tests, procedures, and medications throughout hospitalization   Problem: Safety: Goal: Ability to remain free from injury will improve Outcome: Completed/Met Date Met:  10/31/15 Pt remains free from injury during hospitalization

## 2015-10-31 NOTE — Progress Notes (Signed)
[facility] PROGRESS NOTE    Progress Note   TNISHA Mosley [id number] DOB: 03/29/1941 DOA: 10/28/2015 PCP: Philis Fendt, MD   Brief Narrative:   Katie Mosley is an 75 y.o. female past medical history of chronic diastolic heart failure, adrenal insufficiency : Katie Mosley, her son labs would check and she was taking off her steroids, they comes in for worsening shortness of breath and lower extremity edema for the last several days. The patient is non-English from [place].  Assessment/Plan:   Acute on chronic diastolic heart failure ([place]): Pt is Still resume lasix IV. She is positive JVD and  woody lower extremity edema. Restrict her fluids low sodium diet strict I's and O's and daily weights. Pt is chest pain free, mild elevation in Troponin's is likely due to HF.  Hypothermia: Resolved.  Hyponatremia: Likely due to vol overload. Improving with diuresis.  Benign essential HTN: Seems we will control continue to monitor. Stable.  Hypothyroidism Continue current Synthroid dose, will have to be rechecked as an outpatient in 6 months.  GERD (gastroesophageal reflux disease) PPI.  History of Secondary adrenal insufficiency (Katie Mosley): As per family members she was tapered off her steroids [facility].  Constipation: miralax    DVT Prophylaxis - Lovenox ordered.  Family Communication: gransdaughter Disposition Plan: home in 1 -2 days Code Status:     Code Status Orders        Start     Ordered   10/29/15 0047  Full code   Continuous     10/29/15 0046    Code Status History    Date Active Date Inactive Code Status Order ID Comments User Context   08/08/2015  4:32 AM 08/13/2015  5:39 PM Full Code [id number]  Edwin Dada, MD Inpatient   02/13/2015  5:52 AM 02/18/2015  3:05 PM Full Code [id number]  Ivor Costa, MD Inpatient   10/31/2014  1:32 PM 11/03/2014  6:20 PM Full Code [id number]  Orson Eva, MD Inpatient        IV Access:     Peripheral IV   Procedures and diagnostic studies:   No results found.   Medical Consultants:    None.  Anti-Infectives:   Anti-infectives    Start     Dose/Rate Route Frequency Ordered Stop   10/29/15 0300  vancomycin (VANCOCIN) 500 mg in sodium chloride 0.9 % 100 mL IVPB  Status:  Discontinued     500 mg 100 mL/hr over 60 Minutes Intravenous Every 12 hours 10/29/15 0246 10/29/15 1019   10/29/15 0300  piperacillin-tazobactam (ZOSYN) IVPB 3.375 g  Status:  Discontinued     3.375 g 12.5 mL/hr over 240 Minutes Intravenous 3 times per day 10/29/15 0246 10/29/15 1019      Subjective:    [name] she relates her breathing is improved.  Objective:    Filed Vitals:   10/30/15 2228 10/31/15 0012 10/31/15 0400 10/31/15 0800  BP: 108/42 131/41 126/66 130/55  Pulse:    60  Temp: 98.7 F (37.1 C) 98.4 F (36.9 C) 98.7 F (37.1 C)   TempSrc: Oral Oral Oral   Resp:  14  19  Height:      Weight:   53 kg (116 lb 13.5 oz)   SpO2: 99% 99%  100%    Intake/Output Summary (Last 24 hours) at 10/31/15 1227 Last data filed at 10/31/15 1059  Gross per 24 hour  Intake    805 ml  Output  1000 ml  Net   -195 ml   Filed Weights   10/29/15 0500 10/30/15 0400 10/31/15 0400  Weight: 53.8 kg (118 lb 9.7 oz) 52.39 kg (115 lb 8 oz) 53 kg (116 lb 13.5 oz)    Exam: Gen:  NAD Cardiovascular:  RRR, + JVD  Chest and lungs:   Bilateral crackles in her lungs. Abdomen:  Abdomen soft, NT/ND, + BS Extremities:  Show 1+  edema.   Data Reviewed:    Labs: Basic Metabolic Panel:  Recent Labs Lab 10/28/15 1846 10/29/15 0510 10/30/15 0554 10/31/15 0356  NA 123* 124* 128* 129*  K 3.9 3.9 4.1 4.5  CL 84* 90* 90* 91*  CO2 27 24 26 27   GLUCOSE 134* 69 83 86  BUN 14 14 25* 30*  CREATININE 0.83 0.74 1.41* 1.55*  CALCIUM 9.3 8.6* 8.6* 8.6*  MG  --  1.5*  --   --    GFR Estimated Creatinine Clearance: 23.3 mL/min (by C-G formula based on Cr of 1.55). Liver Function  Tests:  Recent Labs Lab 10/29/15 0510  AST 28  ALT 18  ALKPHOS 73  BILITOT 0.5  PROT 5.7*  ALBUMIN 2.8*   No results for input(s): LIPASE, AMYLASE in the last 168 hours. No results for input(s): AMMONIA in the last 168 hours. Coagulation profile No results for input(s): INR, PROTIME in the last 168 hours.  CBC:  Recent Labs Lab 10/28/15 1846 10/29/15 0510 10/30/15 0554 10/31/15 0356  WBC 2.0* 1.9* 2.6* 3.3*  NEUTROABS  --  0.6*  --   --   HGB 9.9* 8.7* 8.9* 9.9*  HCT 30.0* 24.8* 26.9* 29.8*  MCV 82.9 82.9 82.3 82.5  PLT 301 235 261 300   Cardiac Enzymes:  Recent Labs Lab 10/29/15 0001 10/29/15 0510 10/29/15 0959  TROPONINI 0.05* 0.05* 0.05*   BNP (last 3 results) No results for input(s): PROBNP in the last 8760 hours. CBG: No results for input(s): GLUCAP in the last 168 hours. D-Dimer: No results for input(s): DDIMER in the last 72 hours. Hgb A1c: No results for input(s): HGBA1C in the last 72 hours. Lipid Profile: No results for input(s): CHOL, HDL, LDLCALC, TRIG, CHOLHDL, LDLDIRECT in the last 72 hours. Thyroid function studies:  Recent Labs  10/29/15 1116  TSH 9.036*   Anemia work up: No results for input(s): VITAMINB12, FOLATE, FERRITIN, TIBC, IRON, RETICCTPCT in the last 72 hours. Sepsis Labs:  Recent Labs Lab 10/28/15 1846 10/29/15 0510 10/30/15 0554 10/31/15 0356  WBC 2.0* 1.9* 2.6* 3.3*   Microbiology Recent Results (from the past 240 hour(s))  Culture, Urine     Status: None   Collection Time: 10/28/15 11:06 PM  Result Value Ref Range Status   Specimen Description URINE, RANDOM  Final   Special Requests NONE  Final   Culture NO GROWTH 1 DAY  Final   Report Status 10/30/2015 FINAL  Final  Culture, blood (Routine X 2) w Reflex to ID Panel     Status: None (Preliminary result)   Collection Time: 10/29/15 12:01 AM  Result Value Ref Range Status   Specimen Description BLOOD LEFT ARM  Final   Special Requests AEROBIC BOTTLE ONLY  5ML  Final   Culture NO GROWTH 1 DAY  Final   Report Status PENDING  Incomplete  Culture, blood (Routine X 2) w Reflex to ID Panel     Status: None (Preliminary result)   Collection Time: 10/29/15 12:07 AM  Result Value Ref Range Status   Specimen  Description BLOOD LEFT ARM  Final   Special Requests AEROBIC BOTTLE ONLY 5ML  Final   Culture NO GROWTH 1 DAY  Final   Report Status PENDING  Incomplete  MRSA PCR Screening     Status: None   Collection Time: 10/29/15 12:54 AM  Result Value Ref Range Status   MRSA by PCR NEGATIVE NEGATIVE Final    Comment:        The GeneXpert MRSA Assay (FDA approved for NASAL specimens only), is one component of a comprehensive MRSA colonization surveillance program. It is not intended to diagnose MRSA infection nor to guide or monitor treatment for MRSA infections.      Medications:   . aspirin EC  81 mg Oral Daily  . enoxaparin (LOVENOX) injection  30 mg Subcutaneous Daily  . furosemide  20 mg Oral Daily  . levothyroxine  62.5 mcg Oral QAC breakfast  . metoprolol succinate  50 mg Oral Daily  . sodium chloride flush  3 mL Intravenous Q12H   Continuous Infusions:   Time spent: 25 min   LOS: 3 days   Charlynne Cousins  [facility] Pager [phone]  *Please refer to [email or web address], password [email or web address] to get updated schedule on who will round on this patient, as hospitalists switch teams weekly. If 7PM-7AM, please contact night-coverage at [email or web address], password [email or web address] for any overnight needs.  10/31/2015, 12:27 PM

## 2015-10-31 NOTE — Care Management Important Message (Signed)
Important Message  Patient Details  Name: Katie Mosley MRN: [id number] Date of Birth: [birth year]   Medicare Important Message Given:  Yes    Aizza Santiago Abena 10/31/2015, 2:32 PM

## 2015-10-31 NOTE — Clinical Documentation Improvement (Signed)
Hospitalist  Possible Clinical Conditions:  - Acute Kidney Injury  - Other condition  - Unable to clinically determine  Clinical information/Indicators: Creatinine bumped 0.58 mg/dL in 24 hours (10/28/15 to 10/30/15) while being treated with IV diuretics Creatinine bumped again 0.81 mg/dL in 48 hours (10/29/15 to 10/31/15) while being treated with IV diuretics BUN/Cr/GFR this admission: Component     Latest Ref Rng 10/28/2015 10/29/2015 10/30/2015 10/31/2015  BUN     6 - 20 mg/dL 14 14 25  (H) 30 (H)  Creatinine     0.44 - 1.00 mg/dL 0.83 0.74 1.41 (H) 1.55 (H)  EGFR (Non-AfricanCreatinine     0.44 - 1.00 mg/dL Amer.)     >60 mL/min >60 >60 35 (L) 32 (L)   Please exercise your independent, professional judgment when responding. A specific answer is not anticipated or expected.   Thank You, Erling Conte  RN BSN CCDS [phone] Health Information Management [place]

## 2015-10-31 NOTE — Progress Notes (Signed)
  Echocardiogram 2D Echocardiogram has been performed.  Katie Mosley 10/31/2015, 9:44 AM

## 2015-10-31 NOTE — Consult Note (Signed)
   [facility] CM Inpatient Consult   10/31/2015  JASMINNE CORTHELL 1941-01-16 [id number]  Patient was evaluated for eligibility of [facility] care management services. Spoke with patient and granddaughter at patient's bedside. Granddaughter confirmed patient's primary care provider as Dr. Nolene Ebbs, who is currently not an active participant in [employer]. Patient made aware she is not eligible for [facility] care Management Services at this time. For questions please contact:  Kitai Purdom RN, [place] [facility] Liaison  [phone]) Business Mobile [phone]) Toll free office

## 2015-10-31 NOTE — Evaluation (Signed)
Occupational Therapy Evaluation Patient Details Name: Katie Mosley MRN: [id number] DOB: November 10, 1940 Today's Date: 10/31/2015    History of Present Illness Patient is a 75 yo female admitted 10/28/15 with SOB and LE edema - CHF exacerbation, and hypothermia.     PMH:  CHF, HTN, asthma, adrenal insufficiency   Clinical Impression   Pt's granddaughter interpreting for pt. Pt was independent prior to admission. Presents with generalized weakness and impaired standing balance requiring min guard assist without an AD. Pt has a supportive family and all necessary DME at home. Educated in energy conservation. No further OT needs.    Follow Up Recommendations  No OT follow up    Equipment Recommendations  None recommended by OT    Recommendations for Other Services       Precautions / Restrictions Precautions Precautions: Fall      Mobility Bed Mobility Overal bed mobility: Modified Independent             General bed mobility comments: Use of rail  Transfers Overall transfer level: Needs assistance Equipment used: None   Sit to Stand: Min guard         General transfer comment: min guard for safety, did not physically assist    Balance Overall balance assessment: Needs assistance Sitting-balance support: Feet supported Sitting balance-Leahy Scale: Normal       Standing balance-Leahy Scale: Fair                              ADL Overall ADL's : Needs assistance/impaired Eating/Feeding: Independent;Sitting   Grooming: Wash/dry hands;Standing;Min guard   Upper Body Bathing: Set up;Sitting   Lower Body Bathing: Min guard;Sit to/from stand   Upper Body Dressing : Set up;Sitting   Lower Body Dressing: Min guard;Sit to/from stand   Toilet Transfer: Min guard;RW;Ambulation;Comfort height toilet   Toileting- Clothing Manipulation and Hygiene: Minimal assistance;Sit to/from stand       Functional mobility during ADLs: Min guard General ADL  Comments: Instructed pt and granddaughter in energy conservation strategies.     Vision     Perception     Praxis      Pertinent Vitals/Pain Pain Assessment: No/denies pain     Hand Dominance Right   Extremity/Trunk Assessment Upper Extremity Assessment Upper Extremity Assessment: Overall WFL for tasks assessed   Lower Extremity Assessment Lower Extremity Assessment: Defer to PT evaluation   Cervical / Trunk Assessment Cervical / Trunk Assessment: Normal   Communication Communication Communication: Prefers language other than [place];Interpreter utilized (granddaughter interpreting, pt is from [place])   Cognition Arousal/Alertness: Awake/alert Behavior During Therapy: WFL for tasks assessed/performed Overall Cognitive Status: Difficult to assess                     General Comments       Exercises       Shoulder Instructions      Home Living Family/patient expects to be discharged to:: Private residence Living Arrangements: Children Available Help at Discharge: Family;Available 24 hours/day Type of Home: House Home Access: Stairs to enter [employer] of Steps: 3 Entrance Stairs-Rails: Right;Left;Can reach both Home Layout: One level     Bathroom Shower/Tub: Tub/shower unit;Curtain Shower/tub characteristics: Architectural technologist: Standard     Home Equipment: Clinical cytogeneticist - 2 wheels          Prior Functioning/Environment Level of Independence: Independent  OT Diagnosis: Generalized weakness   OT Problem List:     OT Treatment/Interventions:      OT Goals(Current goals can be found in the care plan section) Acute Rehab OT Goals Patient Stated Goal: None stated  OT Frequency:     Barriers to D/C:            Co-evaluation              End of Session Equipment Utilized During Treatment: Gait belt  Activity Tolerance: Patient tolerated treatment well Patient left: in bed;with call  bell/phone within reach;with family/visitor present   Time: 1405-1420 OT Time Calculation (min): 15 min Charges:  OT General Charges $OT Visit: 1 Procedure OT Evaluation $OT Eval Low Complexity: 1 Procedure G-Codes:    Malka So 10/31/2015, 2:36 PM  [phone]

## 2015-10-31 NOTE — Care Management Note (Signed)
Case Management Note  Patient Details  Name: Katie Mosley MRN: [id number] Date of Birth: 12/24/1940  Subjective/Objective: Pt admitted for CHF- Pt is from home with family support.                    Action/Plan: Family agreeable to [facility] services. Choice offered and family chose [facility] for Services. Referral made with [facility] [place] to begin within 24-48 hours post d/c. No DME needed. No further needs from CM at this time.    Expected Discharge Date:  11/02/15               Expected Discharge Plan:  [name]  In-House Referral:  NA  Discharge planning Services  CM Consult  Post Acute Care Choice:  Home Health Choice offered to:     DME Arranged:  N/A DME Agency:  NA  HH Arranged:  RN [place] Agency:  [facility] [place]  Status of Service:  Completed, signed off  Medicare Important Message Given:    Date Medicare IM Given:    Medicare IM give by:    Date Additional Medicare IM Given:    Additional Medicare Important Message give by:     If discussed at [place] of Stay Meetings, dates discussed:    Additional Comments:  Bethena Roys, RN 10/31/2015, 12:18 PM

## 2015-11-01 LAB — CBC
HCT: 28.6 % — ABNORMAL LOW (ref 36.0–46.0)
Hemoglobin: 9.4 g/dL — ABNORMAL LOW (ref 12.0–15.0)
MCH: 27.5 pg (ref 26.0–34.0)
MCHC: 32.9 g/dL (ref 30.0–36.0)
MCV: 83.6 fL (ref 78.0–100.0)
PLATELETS: 283 10*3/uL (ref 150–400)
RBC: 3.42 MIL/uL — ABNORMAL LOW (ref 3.87–5.11)
RDW: 15.6 % — AB (ref 11.5–15.5)
WBC: 3.5 10*3/uL — ABNORMAL LOW (ref 4.0–10.5)

## 2015-11-01 LAB — BASIC METABOLIC PANEL
Anion gap: 8 (ref 5–15)
BUN: 34 mg/dL — AB (ref 6–20)
CHLORIDE: 94 mmol/L — AB (ref 101–111)
CO2: 27 mmol/L (ref 22–32)
CREATININE: 1.48 mg/dL — AB (ref 0.44–1.00)
Calcium: 8.5 mg/dL — ABNORMAL LOW (ref 8.9–10.3)
GFR calc Af Amer: 39 mL/min — ABNORMAL LOW (ref >60)
GFR calc non Af Amer: 33 mL/min — ABNORMAL LOW (ref >60)
GLUCOSE: 86 mg/dL (ref 65–99)
Potassium: 4.4 mmol/L (ref 3.5–5.1)
SODIUM: 129 mmol/L — AB (ref 135–145)

## 2015-11-01 LAB — BRAIN NATRIURETIC PEPTIDE: B Natriuretic Peptide: 497.7 pg/mL — ABNORMAL HIGH (ref 0.0–100.0)

## 2015-11-01 NOTE — Progress Notes (Signed)
Physical Therapy Treatment Patient Details Name: Katie Mosley MRN: [id number] DOB: [birth year] Today's Date: 11-05-15    History of Present Illness Patient is a 75 yo female admitted 10/28/15 with SOB and LE edema - CHF exacerbation, and hypothermia.     PMH:  CHF, HTN, asthma, adrenal insufficiency    PT Comments    Patient progressing with mobility and able to negotiate steps with assist this session.  Continue to feel will progress to be safe for d/c home with family support and no follow up PT.  Follow Up Recommendations  No PT follow up;Supervision for mobility/OOB     Equipment Recommendations  3in1 (PT)    Recommendations for Other Services       Precautions / Restrictions Precautions Precautions: Fall    Mobility  Bed Mobility Overal bed mobility: Modified Independent             General bed mobility comments: Use of rail  Transfers   Equipment used: None   Sit to Stand: Supervision         General transfer comment: assist for safety due to telemetry wires  Ambulation/Gait Ambulation/Gait assistance: Min guard;Supervision Ambulation Distance (Feet): 220 Feet Assistive device: None       General Gait Details: mildly unsteady and flexed due to holding gown, but no LOB and farily safe   Stairs Stairs: Yes Stairs assistance: Min guard Stair Management: One rail Right;Step to pattern;Forwards;Sideways;Two rails Number of Stairs: 4 General stair comments: forward to ascend with both rails, sideways to descend with one rail and HHA  Wheelchair Mobility    Modified Rankin (Stroke Patients Only)       Balance     Sitting balance-Leahy Scale: Normal       Standing balance-Leahy Scale: Good                      Cognition Arousal/Alertness: Awake/alert Behavior During Therapy: WFL for tasks assessed/performed Overall Cognitive Status: Difficult to assess                      Exercises      General Comments  General comments (skin integrity, edema, etc.): grandson in room to translate very little      Pertinent Vitals/Pain Pain Assessment: No/denies pain    Home Living                      Prior Function            PT Goals (current goals can now be found in the care plan section) Progress towards PT goals: Progressing toward goals    Frequency  Min 3X/week    PT Plan Current plan remains appropriate    Co-evaluation             End of Session Equipment Utilized During Treatment: Gait belt Activity Tolerance: Patient tolerated treatment well Patient left: in bed;with call bell/phone within reach;with family/visitor present;with bed alarm set     Time: [id number] PT Time Calculation (min) (ACUTE ONLY): 15 min  Charges:  $Gait Training: 8-22 mins                    G Codes:      Reginia Naas 11/05/15, 5:01 PM  Magda Kiel, [place] November 05, 2015

## 2015-11-01 NOTE — Progress Notes (Signed)
Report received in patient's room via [facility] and she updated me on new orders, VS, meds and today's events, will assume care of patient.

## 2015-11-01 NOTE — Progress Notes (Signed)
Updated patient's condition and answered questions for patient's granddaughter. She will be staying with her grandma tonight and she is a Presenter, broadcasting that will be graduating this May so she will be able to translate medical education to her grandma, will continue to monitor.

## 2015-11-01 NOTE — Progress Notes (Signed)
[facility] PROGRESS NOTE    Progress Note   Katie Mosley [id number] DOB: November 22, 1940 DOA: 10/28/2015 PCP: Philis Fendt, MD   Brief Narrative:   Katie Mosley is an 75 y.o. female past medical history of chronic diastolic heart failure, adrenal insufficiency : Katie Mosley, her son labs would check and she was taking off her steroids, they comes in for worsening shortness of breath and lower extremity edema for the last several days. The patient is non-English from [place].  Assessment/Plan:   Acute on chronic diastolic heart failure (HCC)/elevated troponins: Pt is overloaded, continue IV Lasix, creatinine has stabilized. This probably around her new baseline renal function She is positive JVD and  woody lower extremity edema. Restrict her fluids low sodium diet strict I's and O's and daily weights. Pt is chest pain free, mild elevation in Troponin's is likely due to HF. 2-D echo done showed an EF of 60% with grade 1 diastolic heart failure mildly elevated pulmonary pressures. We'll need to add a low-dose ace as an outpatient.  Hypothermia: Resolved.  Hyponatremia: Likely due to vol overload. Improving with diuresis.  Benign essential HTN: Seems we will control continue to monitor. Stable.  Hypothyroidism Continue current Synthroid dose, will have to be rechecked as an outpatient in 6 months.  GERD (gastroesophageal reflux disease) PPI.  History of Secondary adrenal insufficiency ([place]): As per family members she was tapered off her steroids at [facility].  Mild leukopenia: Is improving slowly has remained afebrile. We'll need to follow-up with PCP as an outpatient.  Constipation: miralax    DVT Prophylaxis - Lovenox ordered.  Family Communication: gransdaughter Disposition Plan: home in 1 -2 days Code Status:     Code Status Orders        Start     Ordered   10/29/15 0047  Full code   Continuous     10/29/15 0046    Code  Status History    Date Active Date Inactive Code Status Order ID Comments User Context   08/08/2015  4:32 AM 08/13/2015  5:39 PM Full Code [id number]  Edwin Dada, MD Inpatient   02/13/2015  5:52 AM 02/18/2015  3:05 PM Full Code [id number]  Ivor Costa, MD Inpatient   10/31/2014  1:32 PM 11/03/2014  6:20 PM Full Code [id number]  Orson Eva, MD Inpatient        IV Access:    Peripheral IV   Procedures and diagnostic studies:   No results found.   Medical Consultants:    None.  Anti-Infectives:   Anti-infectives    Start     Dose/Rate Route Frequency Ordered Stop   10/29/15 0300  vancomycin (VANCOCIN) 500 mg in sodium chloride 0.9 % 100 mL IVPB  Status:  Discontinued     500 mg 100 mL/hr over 60 Minutes Intravenous Every 12 hours 10/29/15 0246 10/29/15 1019   10/29/15 0300  piperacillin-tazobactam (ZOSYN) IVPB 3.375 g  Status:  Discontinued     3.375 g 12.5 mL/hr over 240 Minutes Intravenous 3 times per day 10/29/15 0246 10/29/15 1019      Subjective:    Katie Mosley she relates her breathing is improved.  Objective:    Filed Vitals:   10/31/15 1800 10/31/15 2000 10/31/15 2352 11/01/15 0500  BP: 127/53  114/48 109/58  Pulse: 62  66 58  Temp:  97.8 F (36.6 C) 99.6 F (37.6 C) 98.4 F (36.9 C)  TempSrc:  Oral Oral Oral  Resp: 14  18 17  Height:      Weight:    52.481 kg (115 lb 11.2 oz)  SpO2: 99%  98% 100%    Intake/Output Summary (Last 24 hours) at 11/01/15 0943 Last data filed at 11/01/15 0400  Gross per 24 hour  Intake    772 ml  Output   1425 ml  Net   -653 ml   Filed Weights   10/30/15 0400 10/31/15 0400 11/01/15 0500  Weight: 52.39 kg (115 lb 8 oz) 53 kg (116 lb 13.5 oz) 52.481 kg (115 lb 11.2 oz)    Exam: Gen:  NAD Cardiovascular:  RRR, + JVD  Chest and lungs:   Good air movement with crackles on the left. Abdomen:  Abdomen soft, NT/ND, + BS Extremities:  No lower extremity edema.   Data Reviewed:    Labs: Basic Metabolic  Panel:  Recent Labs Lab 10/28/15 1846 10/29/15 0510 10/30/15 0554 10/31/15 0356 11/01/15 0410  NA 123* 124* 128* 129* 129*  K 3.9 3.9 4.1 4.5 4.4  CL 84* 90* 90* 91* 94*  CO2 27 24 26 27 27   GLUCOSE 134* 69 83 86 86  BUN 14 14 25* 30* 34*  CREATININE 0.83 0.74 1.41* 1.55* 1.48*  CALCIUM 9.3 8.6* 8.6* 8.6* 8.5*  MG  --  1.5*  --   --   --    GFR Estimated Creatinine Clearance: 24.3 mL/min (by C-G formula based on Cr of 1.48). Liver Function Tests:  Recent Labs Lab 10/29/15 0510  AST 28  ALT 18  ALKPHOS 73  BILITOT 0.5  PROT 5.7*  ALBUMIN 2.8*   No results for input(s): LIPASE, AMYLASE in the last 168 hours. No results for input(s): AMMONIA in the last 168 hours. Coagulation profile No results for input(s): INR, PROTIME in the last 168 hours.  CBC:  Recent Labs Lab 10/28/15 1846 10/29/15 0510 10/30/15 0554 10/31/15 0356 11/01/15 0410  WBC 2.0* 1.9* 2.6* 3.3* 3.5*  NEUTROABS  --  0.6*  --   --   --   HGB 9.9* 8.7* 8.9* 9.9* 9.4*  HCT 30.0* 24.8* 26.9* 29.8* 28.6*  MCV 82.9 82.9 82.3 82.5 83.6  PLT 301 235 261 300 283   Cardiac Enzymes:  Recent Labs Lab 10/29/15 0001 10/29/15 0510 10/29/15 0959  TROPONINI 0.05* 0.05* 0.05*   BNP (last 3 results) No results for input(s): PROBNP in the last 8760 hours. CBG: No results for input(s): GLUCAP in the last 168 hours. D-Dimer: No results for input(s): DDIMER in the last 72 hours. Hgb A1c: No results for input(s): HGBA1C in the last 72 hours. Lipid Profile: No results for input(s): CHOL, HDL, LDLCALC, TRIG, CHOLHDL, LDLDIRECT in the last 72 hours. Thyroid function studies:  Recent Labs  10/29/15 1116  TSH 9.036*   Anemia work up: No results for input(s): VITAMINB12, FOLATE, FERRITIN, TIBC, IRON, RETICCTPCT in the last 72 hours. Sepsis Labs:  Recent Labs Lab 10/29/15 0510 10/30/15 0554 10/31/15 0356 11/01/15 0410  WBC 1.9* 2.6* 3.3* 3.5*   Microbiology Recent Results (from the past 240  hour(s))  Culture, Urine     Status: None   Collection Time: 10/28/15 11:06 PM  Result Value Ref Range Status   Specimen Description URINE, RANDOM  Final   Special Requests NONE  Final   Culture NO GROWTH 1 DAY  Final   Report Status 10/30/2015 FINAL  Final  Culture, blood (Routine X 2) w Reflex to ID Panel     Status: None (Preliminary  result)   Collection Time: 10/29/15 12:01 AM  Result Value Ref Range Status   Specimen Description BLOOD LEFT ARM  Final   Special Requests AEROBIC BOTTLE ONLY 5ML  Final   Culture NO GROWTH 2 DAYS  Final   Report Status PENDING  Incomplete  Culture, blood (Routine X 2) w Reflex to ID Panel     Status: None (Preliminary result)   Collection Time: 10/29/15 12:07 AM  Result Value Ref Range Status   Specimen Description BLOOD LEFT ARM  Final   Special Requests AEROBIC BOTTLE ONLY 5ML  Final   Culture NO GROWTH 2 DAYS  Final   Report Status PENDING  Incomplete  MRSA PCR Screening     Status: None   Collection Time: 10/29/15 12:54 AM  Result Value Ref Range Status   MRSA by PCR NEGATIVE NEGATIVE Final    Comment:        The GeneXpert MRSA Assay (FDA approved for NASAL specimens only), is one component of a comprehensive MRSA colonization surveillance program. It is not intended to diagnose MRSA infection nor to guide or monitor treatment for MRSA infections.      Medications:   . aspirin EC  81 mg Oral Daily  . enoxaparin (LOVENOX) injection  30 mg Subcutaneous Daily  . furosemide  20 mg Intravenous Q12H  . levothyroxine  62.5 mcg Oral QAC breakfast  . metoprolol succinate  50 mg Oral Daily  . sodium chloride flush  3 mL Intravenous Q12H   Continuous Infusions:   Time spent: 15 min   LOS: 4 days   Charlynne Cousins  [facility] Pager [phone]  *Please refer to [email or web address], password [email or web address] to get updated schedule on who will round on this patient, as hospitalists switch teams weekly. If 7PM-7AM, please contact night-coverage  at [email or web address], password [email or web address] for any overnight needs.  11/01/2015, 9:43 AM

## 2015-11-02 LAB — BASIC METABOLIC PANEL
ANION GAP: 10 (ref 5–15)
BUN: 39 mg/dL — ABNORMAL HIGH (ref 6–20)
CO2: 28 mmol/L (ref 22–32)
Calcium: 8.8 mg/dL — ABNORMAL LOW (ref 8.9–10.3)
Chloride: 93 mmol/L — ABNORMAL LOW (ref 101–111)
Creatinine, Ser: 1.5 mg/dL — ABNORMAL HIGH (ref 0.44–1.00)
GFR, EST AFRICAN AMERICAN: 38 mL/min — AB (ref >60)
GFR, EST NON AFRICAN AMERICAN: 33 mL/min — AB (ref >60)
Glucose, Bld: 91 mg/dL (ref 65–99)
POTASSIUM: 4.7 mmol/L (ref 3.5–5.1)
SODIUM: 131 mmol/L — AB (ref 135–145)

## 2015-11-02 MED ORDER — PANTOPRAZOLE SODIUM 40 MG PO TBEC
40.0000 mg | DELAYED_RELEASE_TABLET | Freq: Two times a day (BID) | ORAL | Status: DC
  Administered 2015-11-02 – 2015-11-05 (×7): 40 mg via ORAL
  Filled 2015-11-02 (×9): qty 1

## 2015-11-02 MED ORDER — FUROSEMIDE 10 MG/ML IJ SOLN
40.0000 mg | Freq: Two times a day (BID) | INTRAMUSCULAR | Status: DC
  Administered 2015-11-02 – 2015-11-03 (×2): 40 mg via INTRAVENOUS
  Filled 2015-11-02 (×2): qty 4

## 2015-11-02 NOTE — Progress Notes (Signed)
[facility] PROGRESS NOTE    Progress Note   Katie Mosley [id number] DOB: [birth year] DOA: 10/28/2015 PCP: Philis Fendt, MD   Brief Narrative:   Katie Mosley is an 75 y.o. female past medical history of chronic diastolic heart failure, adrenal insufficiency : [name], her son labs would check and she was taking off her steroids, they comes in for worsening shortness of breath and lower extremity edema for the last several days. The patient is non-English from [place].  Assessment/Plan:   Acute on chronic diastolic heart failure (HCC)/elevated troponins: Pt is overloaded, continue higher dose of IV Lasix, creatinine has stabilized. This probably around her new baseline renal function, cont I and O's. She is positive JVD and  woody lower extremity edema. Continue daily Bumex monitor electrolytes.Marland Kitchen Restrict her fluids low sodium diet strict I's and O's and daily weights. Pt is chest pain free, mild elevation in Troponin's is likely due to HF. 2-D echo done showed an EF of 60% with grade 1 diastolic heart failure mildly elevated pulmonary pressures.  Hypothermia: Resolved.  Hyponatremia: Likely due to vol overload. Sodium continues to improve with diuresis.  Benign essential HTN: Seems we will control continue to monitor. Stable.  Hypothyroidism Continue current Synthroid dose, will have to be rechecked as an outpatient in 6 months.  GERD (gastroesophageal reflux disease) PPI.  History of Secondary adrenal insufficiency ([place]): As per family members she was tapered off her steroids at [facility].  Mild leukopenia: Is improving slowly has remained afebrile. We'll need to follow-up with PCP as an outpatient.  Constipation: miralax    DVT Prophylaxis - Lovenox ordered.  Family Communication: gransdaughter Disposition Plan: home in 2 days Code Status:     Code Status Orders        Start     Ordered   10/29/15 0047  Full code    Continuous     10/29/15 0046    Code Status History    Date Active Date Inactive Code Status Order ID Comments User Context   08/08/2015  4:32 AM 08/13/2015  5:39 PM Full Code [id number]  Edwin Dada, MD Inpatient   02/13/2015  5:52 AM 02/18/2015  3:05 PM Full Code [id number]  Ivor Costa, MD Inpatient   10/31/2014  1:32 PM 11/03/2014  6:20 PM Full Code [id number]  Orson Eva, MD Inpatient        IV Access:    Peripheral IV   Procedures and diagnostic studies:   No results found.   Medical Consultants:    None.  Anti-Infectives:   Anti-infectives    Start     Dose/Rate Route Frequency Ordered Stop   10/29/15 0300  vancomycin (VANCOCIN) 500 mg in sodium chloride 0.9 % 100 mL IVPB  Status:  Discontinued     500 mg 100 mL/hr over 60 Minutes Intravenous Every 12 hours 10/29/15 0246 10/29/15 1019   10/29/15 0300  piperacillin-tazobactam (ZOSYN) IVPB 3.375 g  Status:  Discontinued     3.375 g 12.5 mL/hr over 240 Minutes Intravenous 3 times per day 10/29/15 0246 10/29/15 1019      Subjective:    Katie Mosley she relates her breathing is improved.  Objective:    Filed Vitals:   11/02/15 0400 11/02/15 0446 11/02/15 0500 11/02/15 0800  BP: 121/54  123/51 123/88  Pulse: 59  59 64  Temp: 98.4 F (36.9 C)   97.7 F (36.5 C)  TempSrc: Oral   Oral  Resp: 15  13 18  Height:      Weight:  52.073 kg (114 lb 12.8 oz)    SpO2: 99%  100% 100%    Intake/Output Summary (Last 24 hours) at 11/02/15 0915 Last data filed at 11/02/15 0440  Gross per 24 hour  Intake      0 ml  Output   1900 ml  Net  -1900 ml   Filed Weights   10/31/15 0400 11/01/15 0500 11/02/15 0446  Weight: 53 kg (116 lb 13.5 oz) 52.481 kg (115 lb 11.2 oz) 52.073 kg (114 lb 12.8 oz)    Exam: Gen:  NAD Cardiovascular:  RRR, + JVD  Chest and lungs:   Good air movement with crackles on the left. Abdomen:  Abdomen soft, NT/ND, + BS Extremities:  No lower extremity edema.   Data Reviewed:     Labs: Basic Metabolic Panel:  Recent Labs Lab 10/29/15 0510 10/30/15 0554 10/31/15 0356 11/01/15 0410 11/02/15 0400  NA 124* 128* 129* 129* 131*  K 3.9 4.1 4.5 4.4 4.7  CL 90* 90* 91* 94* 93*  CO2 24 26 27 27 28   GLUCOSE 69 83 86 86 91  BUN 14 25* 30* 34* 39*  CREATININE 0.74 1.41* 1.55* 1.48* 1.50*  CALCIUM 8.6* 8.6* 8.6* 8.5* 8.8*  MG 1.5*  --   --   --   --    GFR Estimated Creatinine Clearance: 23.9 mL/min (by C-G formula based on Cr of 1.5). Liver Function Tests:  Recent Labs Lab 10/29/15 0510  AST 28  ALT 18  ALKPHOS 73  BILITOT 0.5  PROT 5.7*  ALBUMIN 2.8*   No results for input(s): LIPASE, AMYLASE in the last 168 hours. No results for input(s): AMMONIA in the last 168 hours. Coagulation profile No results for input(s): INR, PROTIME in the last 168 hours.  CBC:  Recent Labs Lab 10/28/15 1846 10/29/15 0510 10/30/15 0554 10/31/15 0356 11/01/15 0410  WBC 2.0* 1.9* 2.6* 3.3* 3.5*  NEUTROABS  --  0.6*  --   --   --   HGB 9.9* 8.7* 8.9* 9.9* 9.4*  HCT 30.0* 24.8* 26.9* 29.8* 28.6*  MCV 82.9 82.9 82.3 82.5 83.6  PLT 301 235 261 300 283   Cardiac Enzymes:  Recent Labs Lab 10/29/15 0001 10/29/15 0510 10/29/15 0959  TROPONINI 0.05* 0.05* 0.05*   BNP (last 3 results) No results for input(s): PROBNP in the last 8760 hours. CBG: No results for input(s): GLUCAP in the last 168 hours. D-Dimer: No results for input(s): DDIMER in the last 72 hours. Hgb A1c: No results for input(s): HGBA1C in the last 72 hours. Lipid Profile: No results for input(s): CHOL, HDL, LDLCALC, TRIG, CHOLHDL, LDLDIRECT in the last 72 hours. Thyroid function studies: No results for input(s): TSH, T4TOTAL, T3FREE, THYROIDAB in the last 72 hours.  Invalid input(s): FREET3 Anemia work up: No results for input(s): VITAMINB12, FOLATE, FERRITIN, TIBC, IRON, RETICCTPCT in the last 72 hours. Sepsis Labs:  Recent Labs Lab 10/29/15 0510 10/30/15 0554 10/31/15 0356  11/01/15 0410  WBC 1.9* 2.6* 3.3* 3.5*   Microbiology Recent Results (from the past 240 hour(s))  Culture, Urine     Status: None   Collection Time: 10/28/15 11:06 PM  Result Value Ref Range Status   Specimen Description URINE, RANDOM  Final   Special Requests NONE  Final   Culture NO GROWTH 1 DAY  Final   Report Status 10/30/2015 FINAL  Final  Culture, blood (Routine X 2) w Reflex to ID Panel  Status: None (Preliminary result)   Collection Time: 10/29/15 12:01 AM  Result Value Ref Range Status   Specimen Description BLOOD LEFT ARM  Final   Special Requests AEROBIC BOTTLE ONLY 5ML  Final   Culture NO GROWTH 3 DAYS  Final   Report Status PENDING  Incomplete  Culture, blood (Routine X 2) w Reflex to ID Panel     Status: None (Preliminary result)   Collection Time: 10/29/15 12:07 AM  Result Value Ref Range Status   Specimen Description BLOOD LEFT ARM  Final   Special Requests AEROBIC BOTTLE ONLY 5ML  Final   Culture NO GROWTH 3 DAYS  Final   Report Status PENDING  Incomplete  MRSA PCR Screening     Status: None   Collection Time: 10/29/15 12:54 AM  Result Value Ref Range Status   MRSA by PCR NEGATIVE NEGATIVE Final    Comment:        The GeneXpert MRSA Assay (FDA approved for NASAL specimens only), is one component of a comprehensive MRSA colonization surveillance program. It is not intended to diagnose MRSA infection nor to guide or monitor treatment for MRSA infections.      Medications:   . aspirin EC  81 mg Oral Daily  . enoxaparin (LOVENOX) injection  30 mg Subcutaneous Daily  . furosemide  20 mg Intravenous Q12H  . levothyroxine  62.5 mcg Oral QAC breakfast  . metoprolol succinate  50 mg Oral Daily  . sodium chloride flush  3 mL Intravenous Q12H   Continuous Infusions:   Time spent: 15 min   LOS: 5 days   Charlynne Cousins  [facility] Pager [phone]  *Please refer to [email or web address], password [email or web address] to get updated schedule on who will round on  this patient, as hospitalists switch teams weekly. If 7PM-7AM, please contact night-coverage at [email or web address], password [email or web address] for any overnight needs.  11/02/2015, 9:15 AM

## 2015-11-03 DIAGNOSIS — J4520 Mild intermittent asthma, uncomplicated: Secondary | ICD-10-CM

## 2015-11-03 LAB — CULTURE, BLOOD (ROUTINE X 2)
Culture: NO GROWTH
Culture: NO GROWTH

## 2015-11-03 LAB — BASIC METABOLIC PANEL
ANION GAP: 10 (ref 5–15)
BUN: 51 mg/dL — ABNORMAL HIGH (ref 6–20)
CALCIUM: 8.4 mg/dL — AB (ref 8.9–10.3)
CO2: 27 mmol/L (ref 22–32)
Chloride: 92 mmol/L — ABNORMAL LOW (ref 101–111)
Creatinine, Ser: 1.81 mg/dL — ABNORMAL HIGH (ref 0.44–1.00)
GFR, EST AFRICAN AMERICAN: 30 mL/min — AB (ref >60)
GFR, EST NON AFRICAN AMERICAN: 26 mL/min — AB (ref >60)
Glucose, Bld: 88 mg/dL (ref 65–99)
Potassium: 4.1 mmol/L (ref 3.5–5.1)
Sodium: 129 mmol/L — ABNORMAL LOW (ref 135–145)

## 2015-11-03 NOTE — Progress Notes (Signed)
Physical Therapy Treatment Patient Details Name: Katie Mosley MRN: [id number] DOB: 1941/08/12 Today's Date: 11/03/2015    History of Present Illness Patient is a 75 yo female admitted 10/28/15 with SOB and LE edema - CHF exacerbation, and hypothermia.     PMH:  CHF, HTN, asthma, adrenal insufficiency    PT Comments    PT continuing to follow to progress mobility as tolerated. Ambulating without an assistive device X 210ft. Anticipate pt will D/C to home with family support. PT to continue to follow.   Follow Up Recommendations  No PT follow up;Supervision for mobility/OOB     Equipment Recommendations  3in1 (PT)    Recommendations for Other Services       Precautions / Restrictions Precautions Precautions: Fall Restrictions Weight Bearing Restrictions: No    Mobility  Bed Mobility Overal bed mobility: Independent             General bed mobility comments: supine to sit and sit/supine  Transfers Overall transfer level: Needs assistance Equipment used: None Transfers: Sit to/from Stand Sit to Stand: Supervision            Ambulation/Gait Ambulation/Gait assistance: Min guard Ambulation Distance (Feet): 200 Feet Assistive device: None Gait Pattern/deviations: Step-through pattern;Decreased step length - right;Decreased step length - left Gait velocity: decreased   General Gait Details: mild instability but no loss of balance.    Stairs            Wheelchair Mobility    Modified Rankin (Stroke Patients Only)       Balance Overall balance assessment: Needs assistance Sitting-balance support: No upper extremity supported Sitting balance-Leahy Scale: Normal     Standing balance support: No upper extremity supported Standing balance-Leahy Scale: Good                      Cognition Arousal/Alertness: Awake/alert Behavior During Therapy: WFL for tasks assessed/performed Overall Cognitive Status: Difficult to assess                       Exercises      General Comments        Pertinent Vitals/Pain Pain Assessment: No/denies pain    Home Living                      Prior Function            PT Goals (current goals can now be found in the care plan section) Acute Rehab PT Goals Patient Stated Goal: None stated PT Goal Formulation: With patient/family Time For Goal Achievement: 11/05/15 Potential to Achieve Goals: Good Progress towards PT goals: Progressing toward goals    Frequency  Min 3X/week    PT Plan Current plan remains appropriate    Co-evaluation             End of Session Equipment Utilized During Treatment: Gait belt Activity Tolerance: Patient tolerated treatment well Patient left: in bed;with call bell/phone within reach;with bed alarm set;with family/visitor present     Time: [id number] PT Time Calculation (min) (ACUTE ONLY): 14 min  Charges:  $Gait Training: 8-22 mins                    G Codes:      Cassell Clement, PT, CSCS Pager [phone] Office [phone]  11/03/2015, 2:14 PM

## 2015-11-03 NOTE — Progress Notes (Signed)
Pt rested well throughout the day. No c/o pain

## 2015-11-03 NOTE — Progress Notes (Signed)
[facility]                                                                              Patient Demographics  Katie Mosley, is a 75 y.o. female, DOB - [birth year], [id number]  Admit date - 10/28/2015   Admitting Physician Eugenie Filler, MD  Outpatient Primary MD for the patient is Philis Fendt, MD  LOS - 6   Chief Complaint  Patient presents with  . Shortness of Breath       Brief HPI   Katie Mosley is an 75 y.o. female past medical history of chronic diastolic heart failure, adrenal insufficiency : [name], her son labs would check and she was taking off her steroids, they comes in for worsening shortness of breath and lower extremity edema for the last several days. The patient is non-English from [place].   Assessment & Plan    Acute on chronic diastolic heart failure (HCC)/elevated troponins: - Currently shortness of breath improved, on IV Lasix 40 mg twice a day  - Noted sodium trending down with creatinine trending up, hold IV Lasix today  -  Negative balance of 4.9 L, weight down from 123-> 115lbs  - If creatinine improving tomorrow, restart Lasix at 40 mg daily oral - Continue fluid restriction, 2 g sodium diet 2-D echo done showed an EF of 60% with grade 1 diastolic heart failure mildly elevated pulmonary pressures.  Hypothermia: Resolved.  Hyponatremia: Likely due to vol overload. However creatinine worsening today, Lasix IV today  Benign essential HTN: Seems we will control continue to monitor. Stable.  Hypothyroidism Continue current Synthroid dose, will have to be rechecked as an outpatient in 6 months.  GERD (gastroesophageal reflux disease) PPI.  History of Secondary adrenal insufficiency ([place]): As per family members she was tapered off her steroids at [facility].  Mild leukopenia: Is improving slowly has remained afebrile, will need to follow-up with PCP as an  outpatient.  Constipation: miralax  Code Status: FC  Family Communication: Discussed in detail with the patient, all imaging results, lab results explained to the patient's granddaughter at the bedside   Disposition Plan: DC in a.m. if creatinine improving on oral Lasix  Time Spent in minutes  25 minutes  Procedures  None   Consults   None   DVT Prophylaxis  Lovenox   Medications  Scheduled Meds: . aspirin EC  81 mg Oral Daily  . enoxaparin (LOVENOX) injection  30 mg Subcutaneous Daily  . levothyroxine  62.5 mcg Oral QAC breakfast  . metoprolol succinate  50 mg Oral Daily  . pantoprazole  40 mg Oral BID  . sodium chloride flush  3 mL Intravenous Q12H   Continuous Infusions:  PRN Meds:.sodium chloride, acetaminophen, albuterol, ondansetron (ZOFRAN) IV, sodium chloride flush   Antibiotics   Anti-infectives    Start     Dose/Rate Route Frequency Ordered Stop   10/29/15 0300  vancomycin (VANCOCIN) 500 mg in sodium chloride 0.9 % 100 mL IVPB  Status:  Discontinued     500 mg 100 mL/hr over 60 Minutes Intravenous Every 12 hours 10/29/15 0246 10/29/15 1019  10/29/15 0300  piperacillin-tazobactam (ZOSYN) IVPB 3.375 g  Status:  Discontinued     3.375 g 12.5 mL/hr over 240 Minutes Intravenous 3 times per day 10/29/15 0246 10/29/15 1019        Subjective:   Alonnie Trueman was seen and examined today.  Patient denies dizziness, chest pain, shortness of breath, abdominal pain, N/V/D/C, new weakness, numbess, tingling. No acute events overnight.    Objective:   Filed Vitals:   11/03/15 0750 11/03/15 0800 11/03/15 0811 11/03/15 1136  BP:  135/75 135/75   Pulse:  60 68   Temp: 98 F (36.7 C)   98.2 F (36.8 C)  TempSrc: Oral   Oral  Resp:      Height:      Weight:      SpO2:  100% 100%     Intake/Output Summary (Last 24 hours) at 11/03/15 1211 Last data filed at 11/03/15 0900  Gross per 24 hour  Intake    360 ml  Output    900 ml  Net   -540 ml      Wt Readings from Last 3 Encounters:  11/03/15 52.345 kg (115 lb 6.4 oz)  08/12/15 55.7 kg (122 lb 12.7 oz)  02/18/15 61.916 kg (136 lb 8 oz)     Exam  General: Alert and oriented x 3, NAD  HEENT:  PERRLA, EOMI, Anicteric Sclera, mucous membranes moist.   Neck: Supple, no JVD, no masses  CVS: S1 S2 auscultated, no rubs, murmurs or gallops. Regular rate and rhythm.  Respiratory: Clear to auscultation bilaterally, no wheezing, rales or rhonchi  Abdomen: Soft, nontender, nondistended, + bowel sounds  Ext: no cyanosis clubbing, trace edema  Neuro: AAOx3, Cr N's II- XII. Strength 5/5 upper and lower extremities bilaterally  Skin: No rashes  Psych: Normal affect and demeanor, alert and oriented x3    Data Review   Micro Results Recent Results (from the past 240 hour(s))  Culture, Urine     Status: None   Collection Time: 10/28/15 11:06 PM  Result Value Ref Range Status   Specimen Description URINE, RANDOM  Final   Special Requests NONE  Final   Culture NO GROWTH 1 DAY  Final   Report Status 10/30/2015 FINAL  Final  Culture, blood (Routine X 2) w Reflex to ID Panel     Status: None (Preliminary result)   Collection Time: 10/29/15 12:01 AM  Result Value Ref Range Status   Specimen Description BLOOD LEFT ARM  Final   Special Requests AEROBIC BOTTLE ONLY 5ML  Final   Culture NO GROWTH 4 DAYS  Final   Report Status PENDING  Incomplete  Culture, blood (Routine X 2) w Reflex to ID Panel     Status: None (Preliminary result)   Collection Time: 10/29/15 12:07 AM  Result Value Ref Range Status   Specimen Description BLOOD LEFT ARM  Final   Special Requests AEROBIC BOTTLE ONLY 5ML  Final   Culture NO GROWTH 4 DAYS  Final   Report Status PENDING  Incomplete  MRSA PCR Screening     Status: None   Collection Time: 10/29/15 12:54 AM  Result Value Ref Range Status   MRSA by PCR NEGATIVE NEGATIVE Final    Comment:        The GeneXpert MRSA Assay (FDA approved for NASAL  specimens only), is one component of a comprehensive MRSA colonization surveillance program. It is not intended to diagnose MRSA infection nor to guide or monitor treatment for MRSA  infections.     Radiology Reports Dg Chest 2 View  10/28/2015  CLINICAL DATA:  75 year old female with shortness of breath. History of asthma and CHF radiograph dated 08/10/2015 EXAM: CHEST  2 VIEW COMPARISON:  None. FINDINGS: Two views of the chest demonstrate central vascular and interstitial prominence which may represent a degree of congestion or interstitial edema. There is a focal area of opacity at the right lung base likely combination of a small pleural effusion and consolidative changes of the adjacent lung. Left lung base interstitial prominence may represent atelectatic changes. Trace left pleural effusion may be present. There is no pneumothorax. There is mild eventration of the left hemidiaphragm. Mildly enlarged cardiac silhouette. No acute osseous pathology identified. IMPRESSION: Central vascular and interstitial prominence likely mild interstitial edema or congestive changes. Right lung base opacity combination of a small pleural effusion and consolidative changes/pneumonia. Clinical correlation and follow-up recommended. Electronically Signed   By: Anner Crete M.D.   On: 10/28/2015 18:05    CBC  Recent Labs Lab 10/28/15 1846 10/29/15 0510 10/30/15 0554 10/31/15 0356 11/01/15 0410  WBC 2.0* 1.9* 2.6* 3.3* 3.5*  HGB 9.9* 8.7* 8.9* 9.9* 9.4*  HCT 30.0* 24.8* 26.9* 29.8* 28.6*  PLT 301 235 261 300 283  MCV 82.9 82.9 82.3 82.5 83.6  MCH 27.3 29.1 27.2 27.4 27.5  MCHC 33.0 35.1 33.1 33.2 32.9  RDW 15.0 15.3 15.2 15.3 15.6*  LYMPHSABS  --  0.9  --   --   --   MONOABS  --  0.3  --   --   --   EOSABS  --  0.1  --   --   --   BASOSABS  --  0.0  --   --   --     Chemistries   Recent Labs Lab 10/29/15 0510 10/30/15 0554 10/31/15 0356 11/01/15 0410 11/02/15 0400 11/03/15 0333  NA  124* 128* 129* 129* 131* 129*  K 3.9 4.1 4.5 4.4 4.7 4.1  CL 90* 90* 91* 94* 93* 92*  CO2 24 26 27 27 28 27   GLUCOSE 69 83 86 86 91 88  BUN 14 25* 30* 34* 39* 51*  CREATININE 0.74 1.41* 1.55* 1.48* 1.50* 1.81*  CALCIUM 8.6* 8.6* 8.6* 8.5* 8.8* 8.4*  MG 1.5*  --   --   --   --   --   AST 28  --   --   --   --   --   ALT 18  --   --   --   --   --   ALKPHOS 73  --   --   --   --   --   BILITOT 0.5  --   --   --   --   --    ------------------------------------------------------------------------------------------------------------------ estimated creatinine clearance is 19.8 mL/min (by C-G formula based on Cr of 1.81). ------------------------------------------------------------------------------------------------------------------ No results for input(s): HGBA1C in the last 72 hours. ------------------------------------------------------------------------------------------------------------------ No results for input(s): CHOL, HDL, LDLCALC, TRIG, CHOLHDL, LDLDIRECT in the last 72 hours. ------------------------------------------------------------------------------------------------------------------ No results for input(s): TSH, T4TOTAL, T3FREE, THYROIDAB in the last 72 hours.  Invalid input(s): FREET3 ------------------------------------------------------------------------------------------------------------------ No results for input(s): VITAMINB12, FOLATE, FERRITIN, TIBC, IRON, RETICCTPCT in the last 72 hours.  Coagulation profile No results for input(s): INR, PROTIME in the last 168 hours.  No results for input(s): DDIMER in the last 72 hours.  Cardiac Enzymes  Recent Labs Lab 10/29/15 0001 10/29/15 0510 10/29/15 0959  TROPONINI 0.05* 0.05* 0.05*   ------------------------------------------------------------------------------------------------------------------  Invalid input(s): POCBNP  No results for input(s): GLUCAP in the last 72 hours.   Sundra Haddix M.D. [facility] 11/03/2015, 12:11 PM  Pager: [phone] Between 7am to 7pm - call Pager - [phone]  After 7pm go to [email or web address] - password [email or web address]  Call night coverage person covering after 7pm

## 2015-11-03 NOTE — Progress Notes (Signed)
UR Completed Tyrrell Stephens Graves-Bigelow, RN,BSN [phone]  

## 2015-11-04 DIAGNOSIS — E2749 Other adrenocortical insufficiency: Secondary | ICD-10-CM

## 2015-11-04 DIAGNOSIS — E871 Hypo-osmolality and hyponatremia: Secondary | ICD-10-CM

## 2015-11-04 DIAGNOSIS — I10 Essential (primary) hypertension: Secondary | ICD-10-CM

## 2015-11-04 DIAGNOSIS — E039 Hypothyroidism, unspecified: Secondary | ICD-10-CM

## 2015-11-04 DIAGNOSIS — I5033 Acute on chronic diastolic (congestive) heart failure: Secondary | ICD-10-CM

## 2015-11-04 DIAGNOSIS — K219 Gastro-esophageal reflux disease without esophagitis: Secondary | ICD-10-CM

## 2015-11-04 LAB — BASIC METABOLIC PANEL
ANION GAP: 10 (ref 5–15)
BUN: 56 mg/dL — AB (ref 6–20)
CO2: 26 mmol/L (ref 22–32)
Calcium: 8.4 mg/dL — ABNORMAL LOW (ref 8.9–10.3)
Chloride: 94 mmol/L — ABNORMAL LOW (ref 101–111)
Creatinine, Ser: 1.97 mg/dL — ABNORMAL HIGH (ref 0.44–1.00)
GFR calc Af Amer: 27 mL/min — ABNORMAL LOW (ref >60)
GFR calc non Af Amer: 24 mL/min — ABNORMAL LOW (ref >60)
GLUCOSE: 88 mg/dL (ref 65–99)
POTASSIUM: 4 mmol/L (ref 3.5–5.1)
Sodium: 130 mmol/L — ABNORMAL LOW (ref 135–145)

## 2015-11-04 NOTE — Progress Notes (Signed)
Pt and family updated. Pt resting comfortably

## 2015-11-04 NOTE — Progress Notes (Signed)
Physical Therapy Treatment & Discharge Patient Details Name: Katie Mosley MRN: [id number] DOB: Mar 07, 1941 Today's Date: 11/09/15    History of Present Illness Patient is a 75 yo female admitted 10/28/15 with SOB and LE edema - CHF exacerbation, and hypothermia.     PMH:  CHF, HTN, asthma, adrenal insufficiency    PT Comments    Progressing with mobility and has achieved goals for acute level PT.  No further skilled PT needs; feel patient will have needed assist for mobility at d/c.  Follow Up Recommendations  No PT follow up;Supervision for mobility/OOB     Equipment Recommendations  3in1 (PT)    Recommendations for Other Services       Precautions / Restrictions Precautions Precautions: Fall    Mobility  Bed Mobility Overal bed mobility: Independent             General bed mobility comments: supine to sit and sit/supine  Transfers     Transfers: Sit to/from Stand Sit to Stand: Supervision         General transfer comment: supervision for safety due to small space with BSC and curtain close to bed  Ambulation/Gait Ambulation/Gait assistance: Min guard;Supervision Ambulation Distance (Feet): 300 Feet Assistive device: None Gait Pattern/deviations: Wide base of support;Decreased stride length     General Gait Details: mild instability but no loss of balance. minguard given mainly for direction   Stairs Stairs: Yes Stairs assistance: Min guard Stair Management: Alternating pattern;Step to pattern;Sideways;Forwards Number of Stairs: 4 General stair comments: forward to ascend with both rails alternating pattern, sideways to descend with on rail, step to pattern, assist for safety  Wheelchair Mobility    Modified Rankin (Stroke Patients Only)       Balance           Standing balance support: No upper extremity supported Standing balance-Leahy Scale: Good                      Cognition Arousal/Alertness: Awake/alert Behavior  During Therapy: WFL for tasks assessed/performed Overall Cognitive Status: Within Functional Limits for tasks assessed                      Exercises      General Comments        Pertinent Vitals/Pain Pain Assessment: Faces Faces Pain Scale: No hurt    Home Living                      Prior Function            PT Goals (current goals can now be found in the care plan section) Progress towards PT goals: Goals met/education completed, patient discharged from PT    Frequency  Min 3X/week    PT Plan Current plan remains appropriate    Co-evaluation             End of Session Equipment Utilized During Treatment: Gait belt Activity Tolerance: Patient tolerated treatment well Patient left: in bed;with call bell/phone within reach     Time: 1515-1530 PT Time Calculation (min) (ACUTE ONLY): 15 min  Charges:  $Gait Training: 8-22 mins                    G Codes:      Reginia Naas 09-Nov-2015, 5:24 PM  Magda Kiel, [place] 2015-11-09

## 2015-11-04 NOTE — Care Management Important Message (Signed)
Important Message  Patient Details  Name: Katie Mosley MRN: [id number] Date of Birth: [birth year]   Medicare Important Message Given:  Yes    Lorianna Spadaccini Abena 11/04/2015, 11:34 AM

## 2015-11-04 NOTE — Progress Notes (Signed)
PATIENT DETAILS Name: Katie Mosley Age: 75 y.o. Sex: female Date of Birth: 1940/10/06 Admit Date: 10/28/2015 Admitting Physician Eugenie Filler, MD [id number] Bunnie Domino, MD  Subjective: No complaints-lying in bed.  Assessment/Plan: Principal Problem:  Acute on chronic diastolic heart failure: Compensated, -5.6 L so far, weight down to 113 pounds (123 pounds on admission). Continue to monitor off diuretics for one more day.  Active Problems: Acute renal failure: Likely secondary to diuretics-continue to hold diuretics, recheck electrolytes tomorrow. If creatinine is stable/leveled off-suspect will be discharged home. I have asked patient's family to see if they can make an appointment with her PCP early next week for electrolyte check.  Hyponatremia: Suspect secondary to volume overload from above. Currently appears euvolemic. Reviewed care everywhere, recent ACTH stimulation test done off steroids was negative.  Hypothyroidism: Continue Synthroid, has outpatient follow-up with endocrinology for recheck TSH in the next few months.  ? Adrenal insufficiency: Reviewed notes from care everywhere-no longer on steroids-most recent ACTH stimulation test in January 2017 not suggestive of adrenal insufficiency. Has outpatient follow-up with endocrinology.  Hypertension: Controlled, continue metoprolol.  History moderate pulmonary hypertension: Echocardiogram confirms PA pressure around 60, reviewed prior echo in 2016-similar findings. Suspect this is a chronic issue-and is stable for further workup in the outpatient setting. Some of her shortness of breath could be from this issue.  GERD: Continue PPI  Disposition: Remain inpatient  Antimicrobial agents  See below  Anti-infectives    Start     Dose/Rate Route Frequency Ordered Stop   10/29/15 0300  vancomycin (VANCOCIN) 500 mg in sodium chloride 0.9 % 100 mL IVPB  Status:  Discontinued     500 mg 100 mL/hr over  60 Minutes Intravenous Every 12 hours 10/29/15 0246 10/29/15 1019   10/29/15 0300  piperacillin-tazobactam (ZOSYN) IVPB 3.375 g  Status:  Discontinued     3.375 g 12.5 mL/hr over 240 Minutes Intravenous 3 times per day 10/29/15 0246 10/29/15 1019      DVT Prophylaxis: Prophylactic Lovenox   Code Status: Full code   Family Communication Grand daughter at bedside  Procedures: None  CONSULTS:  None  Time spent 30 minutes-Greater than 50% of this time was spent in counseling, explanation of diagnosis, planning of further management, and coordination of care.  MEDICATIONS: Scheduled Meds: . aspirin EC  81 mg Oral Daily  . enoxaparin (LOVENOX) injection  30 mg Subcutaneous Daily  . levothyroxine  62.5 mcg Oral QAC breakfast  . metoprolol succinate  50 mg Oral Daily  . pantoprazole  40 mg Oral BID  . sodium chloride flush  3 mL Intravenous Q12H   Continuous Infusions:  PRN Meds:.sodium chloride, acetaminophen, albuterol, ondansetron (ZOFRAN) IV, sodium chloride flush    PHYSICAL EXAM: Vital signs in last 24 hours: Filed Vitals:   11/03/15 2039 11/04/15 0400 11/04/15 0515 11/04/15 0516  BP: 117/50 127/61    Pulse:      Temp:    97.8 F (36.6 C)  TempSrc:    Axillary  Resp: 17 14    Height:      Weight:   51.665 kg (113 lb 14.4 oz)   SpO2:        Weight change: -0.68 kg (-1 lb 8 oz) Filed Weights   11/02/15 0446 11/03/15 0606 11/04/15 0515  Weight: 52.073 kg (114 lb 12.8 oz) 52.345 kg (115 lb 6.4 oz) 51.665 kg (113 lb 14.4 oz)  Body mass index is 22.99 kg/(m^2).   Gen Exam: Awake and alert with clear speech.  Neck: Supple, No JVD.   Chest: B/L Clear.   CVS: S1 S2 Regular, no murmurs.  Abdomen: soft, BS +, non tender, non distended.  Extremities: no edema, lower extremities warm to touch. Neurologic: Non Focal.   Skin: No Rash.   Wounds: N/A.   Intake/Output from previous day:  Intake/Output Summary (Last 24 hours) at 11/04/15 1238 Last data filed at  11/04/15 0854  Gross per 24 hour  Intake    900 ml  Output   1600 ml  Net   -700 ml     LAB RESULTS: CBC  Recent Labs Lab 10/28/15 1846 10/29/15 0510 10/30/15 0554 10/31/15 0356 11/01/15 0410  WBC 2.0* 1.9* 2.6* 3.3* 3.5*  HGB 9.9* 8.7* 8.9* 9.9* 9.4*  HCT 30.0* 24.8* 26.9* 29.8* 28.6*  PLT 301 235 261 300 283  MCV 82.9 82.9 82.3 82.5 83.6  MCH 27.3 29.1 27.2 27.4 27.5  MCHC 33.0 35.1 33.1 33.2 32.9  RDW 15.0 15.3 15.2 15.3 15.6*  LYMPHSABS  --  0.9  --   --   --   MONOABS  --  0.3  --   --   --   EOSABS  --  0.1  --   --   --   BASOSABS  --  0.0  --   --   --     Chemistries   Recent Labs Lab 10/29/15 0510  10/31/15 0356 11/01/15 0410 11/02/15 0400 11/03/15 0333 11/04/15 0324  NA 124*  < > 129* 129* 131* 129* 130*  K 3.9  < > 4.5 4.4 4.7 4.1 4.0  CL 90*  < > 91* 94* 93* 92* 94*  CO2 24  < > 27 27 28 27 26   GLUCOSE 69  < > 86 86 91 88 88  BUN 14  < > 30* 34* 39* 51* 56*  CREATININE 0.74  < > 1.55* 1.48* 1.50* 1.81* 1.97*  CALCIUM 8.6*  < > 8.6* 8.5* 8.8* 8.4* 8.4*  MG 1.5*  --   --   --   --   --   --   < > = values in this interval not displayed.  CBG: No results for input(s): GLUCAP in the last 168 hours.  GFR Estimated Creatinine Clearance: 16.8 mL/min (by C-G formula based on Cr of 1.97).  Coagulation profile No results for input(s): INR, PROTIME in the last 168 hours.  Cardiac Enzymes  Recent Labs Lab 10/29/15 0001 10/29/15 0510 10/29/15 0959  TROPONINI 0.05* 0.05* 0.05*    Invalid input(s): POCBNP No results for input(s): DDIMER in the last 72 hours. No results for input(s): HGBA1C in the last 72 hours. No results for input(s): CHOL, HDL, LDLCALC, TRIG, CHOLHDL, LDLDIRECT in the last 72 hours. No results for input(s): TSH, T4TOTAL, T3FREE, THYROIDAB in the last 72 hours.  Invalid input(s): FREET3 No results for input(s): VITAMINB12, FOLATE, FERRITIN, TIBC, IRON, RETICCTPCT in the last 72 hours. No results for input(s): LIPASE,  AMYLASE in the last 72 hours.  Urine Studies No results for input(s): UHGB, CRYS in the last 72 hours.  Invalid input(s): UACOL, UAPR, USPG, UPH, UTP, UGL, UKET, UBIL, UNIT, UROB, ULEU, UEPI, UWBC, URBC, UBAC, CAST, UCOM, BILUA  MICROBIOLOGY: Recent Results (from the past 240 hour(s))  Culture, Urine     Status: None   Collection Time: 10/28/15 11:06 PM  Result Value Ref Range Status  Specimen Description URINE, RANDOM  Final   Special Requests NONE  Final   Culture NO GROWTH 1 DAY  Final   Report Status 10/30/2015 FINAL  Final  Culture, blood (Routine X 2) w Reflex to ID Panel     Status: None   Collection Time: 10/29/15 12:01 AM  Result Value Ref Range Status   Specimen Description BLOOD LEFT ARM  Final   Special Requests AEROBIC BOTTLE ONLY 5ML  Final   Culture NO GROWTH 5 DAYS  Final   Report Status 11/03/2015 FINAL  Final  Culture, blood (Routine X 2) w Reflex to ID Panel     Status: None   Collection Time: 10/29/15 12:07 AM  Result Value Ref Range Status   Specimen Description BLOOD LEFT ARM  Final   Special Requests AEROBIC BOTTLE ONLY 5ML  Final   Culture NO GROWTH 5 DAYS  Final   Report Status 11/03/2015 FINAL  Final  MRSA PCR Screening     Status: None   Collection Time: 10/29/15 12:54 AM  Result Value Ref Range Status   MRSA by PCR NEGATIVE NEGATIVE Final    Comment:        The GeneXpert MRSA Assay (FDA approved for NASAL specimens only), is one component of a comprehensive MRSA colonization surveillance program. It is not intended to diagnose MRSA infection nor to guide or monitor treatment for MRSA infections.     RADIOLOGY STUDIES/RESULTS: Dg Chest 2 View  10/28/2015  CLINICAL DATA:  75 year old female with shortness of breath. History of asthma and CHF radiograph dated 08/10/2015 EXAM: CHEST  2 VIEW COMPARISON:  None. FINDINGS: Two views of the chest demonstrate central vascular and interstitial prominence which may represent a degree of congestion or  interstitial edema. There is a focal area of opacity at the right lung base likely combination of a small pleural effusion and consolidative changes of the adjacent lung. Left lung base interstitial prominence may represent atelectatic changes. Trace left pleural effusion may be present. There is no pneumothorax. There is mild eventration of the left hemidiaphragm. Mildly enlarged cardiac silhouette. No acute osseous pathology identified. IMPRESSION: Central vascular and interstitial prominence likely mild interstitial edema or congestive changes. Right lung base opacity combination of a small pleural effusion and consolidative changes/pneumonia. Clinical correlation and follow-up recommended. Electronically Signed   By: Anner Crete M.D.   On: 10/28/2015 18:05    Oren Binet, MD  [facility] Pager:[phone]  If 7PM-7AM, please contact night-coverage [email or web address] Password [email or web address] 11/04/2015, 12:38 PM   LOS: 7 days

## 2015-11-05 LAB — BASIC METABOLIC PANEL
ANION GAP: 8 (ref 5–15)
BUN: 45 mg/dL — ABNORMAL HIGH (ref 6–20)
CALCIUM: 8.9 mg/dL (ref 8.9–10.3)
CO2: 28 mmol/L (ref 22–32)
Chloride: 97 mmol/L — ABNORMAL LOW (ref 101–111)
Creatinine, Ser: 1.29 mg/dL — ABNORMAL HIGH (ref 0.44–1.00)
GFR calc Af Amer: 46 mL/min — ABNORMAL LOW (ref >60)
GFR, EST NON AFRICAN AMERICAN: 39 mL/min — AB (ref >60)
GLUCOSE: 95 mg/dL (ref 65–99)
Potassium: 4 mmol/L (ref 3.5–5.1)
Sodium: 133 mmol/L — ABNORMAL LOW (ref 135–145)

## 2015-11-05 MED ORDER — PANTOPRAZOLE SODIUM 40 MG PO TBEC: 40.0000 mg | DELAYED_RELEASE_TABLET | Freq: Every day | ORAL | Status: AC

## 2015-11-05 MED ORDER — FUROSEMIDE 40 MG PO TABS: 40.0000 mg | ORAL_TABLET | Freq: Every day | ORAL | Status: DC

## 2015-11-05 MED ORDER — POTASSIUM CHLORIDE ER 20 MEQ PO TBCR: 20.0000 meq | EXTENDED_RELEASE_TABLET | Freq: Every day | ORAL | Status: DC

## 2015-11-05 NOTE — Discharge Summary (Signed)
PATIENT DETAILS Name: Katie Mosley Age: 75 y.o. Sex: female Date of Birth: 1941/01/22 MRN: [id number]. Admitting Physician: Eugenie Filler, MD [place]:[id number] Bunnie Domino, MD  Admit Date: 10/28/2015 Discharge date: 11/05/2015  Recommendations for Outpatient Follow-up:  1. Ensure follow up with Endocrine-has appointment in April (at [facility]) 2. Refer to Cardiology for evaluation of chronic Pul HTN  3. Please repeat BMET at next visit   PRIMARY DISCHARGE DIAGNOSIS:  Principal Problem:   Acute on chronic diastolic heart failure (HCC) Active Problems:   Hyponatremia   Benign essential HTN   Hypothyroidism   GERD (gastroesophageal reflux disease)   Asthma   Shortness of breath   Hypothermia   Secondary adrenal insufficiency (HCC)   Constipation   Acute exacerbation of CHF (congestive heart failure) (HCC)      PAST MEDICAL HISTORY: Past Medical History  Diagnosis Date  . Hypertension   . Hypothyroidism   . HTN (hypertension)   . GERD (gastroesophageal reflux disease)   . Asthma   . Thyroid disease     not know in detail  . CHF (congestive heart failure) ([place])   . Glaucoma   . Adrenal insufficiency ([place])   . Adrenal insufficiency ([place])     Dx'd [facility]2015  . Shortness of breath dyspnea   . Headache     DISCHARGE MEDICATIONS: Current Discharge Medication List    START taking these medications   Details  pantoprazole (PROTONIX) 40 MG tablet Take 1 tablet (40 mg total) by mouth daily. Qty: 30 tablet, Refills: 0    potassium chloride 20 MEQ TBCR Take 20 mEq by mouth daily. Qty: 30 tablet, Refills: 0      CONTINUE these medications which have CHANGED   Details  furosemide (LASIX) 40 MG tablet Take 1 tablet (40 mg total) by mouth daily. Qty: 30 tablet, Refills: 0      CONTINUE these medications which have NOT CHANGED   Details  acetaminophen (TYLENOL) 325 MG tablet Take 325 mg by mouth every 6 (six) hours as needed for moderate pain.     amLODipine  (NORVASC) 5 MG tablet Take 1 tablet (5 mg total) by mouth daily. Qty: 30 tablet, Refills: 0    aspirin EC 81 MG tablet Take 81 mg by mouth daily.    levothyroxine (SYNTHROID, LEVOTHROID) 50 MCG tablet Take 1 tablet (50 mcg total) by mouth daily before breakfast. Qty: 30 tablet, Refills: 0    metoprolol succinate (TOPROL-XL) 50 MG 24 hr tablet Take 50 mg by mouth daily. Refills: 2        ALLERGIES:  No Known Allergies  BRIEF HPI:  See H&P, Labs, Consult and Test reports for all details in brief, patient was admitted for evaluation of sob and lower ext edema.  CONSULTATIONS:   None  PERTINENT RADIOLOGIC STUDIES: Dg Chest 2 View  10/28/2015  CLINICAL DATA:  75 year old female with shortness of breath. History of asthma and CHF radiograph dated 08/10/2015 EXAM: CHEST  2 VIEW COMPARISON:  None. FINDINGS: Two views of the chest demonstrate central vascular and interstitial prominence which may represent a degree of congestion or interstitial edema. There is a focal area of opacity at the right lung base likely combination of a small pleural effusion and consolidative changes of the adjacent lung. Left lung base interstitial prominence may represent atelectatic changes. Trace left pleural effusion may be present. There is no pneumothorax. There is mild eventration of the left hemidiaphragm. Mildly enlarged cardiac silhouette. No acute osseous pathology  identified. IMPRESSION: Central vascular and interstitial prominence likely mild interstitial edema or congestive changes. Right lung base opacity combination of a small pleural effusion and consolidative changes/pneumonia. Clinical correlation and follow-up recommended. Electronically Signed   By: Anner Crete M.D.   On: 10/28/2015 18:05     PERTINENT LAB RESULTS: CBC: No results for input(s): WBC, HGB, HCT, PLT in the last 72 hours. CMET CMP     Component Value Date/Time   NA 133* 11/05/2015 0520   K 4.0 11/05/2015 0520   CL 97*  11/05/2015 0520   CO2 28 11/05/2015 0520   GLUCOSE 95 11/05/2015 0520   BUN 45* 11/05/2015 0520   CREATININE 1.29* 11/05/2015 0520   CALCIUM 8.9 11/05/2015 0520   PROT 5.7* 10/29/2015 0510   ALBUMIN 2.8* 10/29/2015 0510   AST 28 10/29/2015 0510   ALT 18 10/29/2015 0510   ALKPHOS 73 10/29/2015 0510   BILITOT 0.5 10/29/2015 0510   GFRNONAA 39* 11/05/2015 0520   GFRAA 46* 11/05/2015 0520    GFR Estimated Creatinine Clearance: 25.7 mL/min (by C-G formula based on Cr of 1.29). No results for input(s): LIPASE, AMYLASE in the last 72 hours. No results for input(s): CKTOTAL, CKMB, CKMBINDEX, TROPONINI in the last 72 hours. Invalid input(s): POCBNP No results for input(s): DDIMER in the last 72 hours. No results for input(s): HGBA1C in the last 72 hours. No results for input(s): CHOL, HDL, LDLCALC, TRIG, CHOLHDL, LDLDIRECT in the last 72 hours. No results for input(s): TSH, T4TOTAL, T3FREE, THYROIDAB in the last 72 hours.  Invalid input(s): FREET3 No results for input(s): VITAMINB12, FOLATE, FERRITIN, TIBC, IRON, RETICCTPCT in the last 72 hours. Coags: No results for input(s): INR in the last 72 hours.  Invalid input(s): PT Microbiology: Recent Results (from the past 240 hour(s))  Culture, Urine     Status: None   Collection Time: 10/28/15 11:06 PM  Result Value Ref Range Status   Specimen Description URINE, RANDOM  Final   Special Requests NONE  Final   Culture NO GROWTH 1 DAY  Final   Report Status 10/30/2015 FINAL  Final  Culture, blood (Routine X 2) w Reflex to ID Panel     Status: None   Collection Time: 10/29/15 12:01 AM  Result Value Ref Range Status   Specimen Description BLOOD LEFT ARM  Final   Special Requests AEROBIC BOTTLE ONLY 5ML  Final   Culture NO GROWTH 5 DAYS  Final   Report Status 11/03/2015 FINAL  Final  Culture, blood (Routine X 2) w Reflex to ID Panel     Status: None   Collection Time: 10/29/15 12:07 AM  Result Value Ref Range Status   Specimen  Description BLOOD LEFT ARM  Final   Special Requests AEROBIC BOTTLE ONLY 5ML  Final   Culture NO GROWTH 5 DAYS  Final   Report Status 11/03/2015 FINAL  Final  MRSA PCR Screening     Status: None   Collection Time: 10/29/15 12:54 AM  Result Value Ref Range Status   MRSA by PCR NEGATIVE NEGATIVE Final    Comment:        The GeneXpert MRSA Assay (FDA approved for NASAL specimens only), is one component of a comprehensive MRSA colonization surveillance program. It is not intended to diagnose MRSA infection nor to guide or monitor treatment for MRSA infections.      BRIEF HOSPITAL COURSE:  Acute on chronic diastolic heart failure: Compensated by day of discharge, -6.2 L so far, weight stable at  113 pounds (123 pounds on admission).Managed with IV Diuretics, which was briefly held due to ARF-subsequently creatinine then improved. Will continue with Lasix on discharge-but increase dose to 40 mg daily. Family will take her to her PCP's office early next week for a electrolyte check   Active Problems: Acute renal failure: Likely secondary to diuretics. Creatinine significantly improved by day of discharge, diuretics were held for a few days. Family will take her to her PCP's office early next week for a electrolyte check   Hyponatremia: Suspect secondary to volume overload from above. Currently appears euvolemic. Reviewed care everywhere, recent ACTH stimulation test done off steroids was negative.  Hypothyroidism: Continue Synthroid, has outpatient follow-up with endocrinology for recheck TSH in the next few months.  ? Adrenal insufficiency: Reviewed notes from care everywhere-no longer on steroids-most recent ACTH stimulation test in January 2017 not suggestive of adrenal insufficiency. Has outpatient follow-up with endocrinology at [facility] in April per family.  Hypertension: Controlled, continue metoprolol and amlodipine  History moderate pulmonary hypertension: Echocardiogram confirms  PA pressure around 60, reviewed prior echo in 2016-similar findings. Suspect this is a chronic issue-and is stable for further workup in the outpatient setting. Some of her shortness of breath could be from this issue.  GERD: Continue PPI  TODAY-DAY OF DISCHARGE:  Subjective:   Katie Mosley today has no headache,no chest abdominal pain,no new weakness tingling or numbness, feels much better wants to go home today.   Objective:   Blood pressure 176/70, pulse 58, temperature 97.7 F (36.5 C), temperature source Oral, resp. rate 16, height 4\' 11"  (1.499 m), weight 51.483 kg (113 lb 8 oz), SpO2 100 %.  Intake/Output Summary (Last 24 hours) at 11/05/15 0833 Last data filed at 11/04/15 2026  Gross per 24 hour  Intake    720 ml  Output   1000 ml  Net   -280 ml   Filed Weights   11/03/15 0606 11/04/15 0515 11/05/15 0553  Weight: 52.345 kg (115 lb 6.4 oz) 51.665 kg (113 lb 14.4 oz) 51.483 kg (113 lb 8 oz)    Exam Awake Alert, Oriented *3, No new F.N deficits, Normal affect [place].AT,PERRAL Supple Neck,No JVD, No cervical lymphadenopathy appriciated.  Symmetrical Chest wall movement, Good air movement bilaterally, CTAB RRR,No Gallops,Rubs or new Murmurs, No Parasternal Heave +ve B.Sounds, Abd Soft, Non tender, No organomegaly appriciated, No rebound -guarding or rigidity. No Cyanosis, Clubbing or edema, No new Rash or bruise  DISCHARGE CONDITION: Stable  DISPOSITION: Home with home health services  DISCHARGE INSTRUCTIONS:    Activity:  As tolerated  Get Medicines reviewed and adjusted: Please take all your medications with you for your next visit with your Primary MD  Please request your Primary MD to go over all hospital tests and procedure/radiological results at the follow up, please ask your Primary MD to get all Hospital records sent to his/her office.  If you experience worsening of your admission symptoms, develop shortness of breath, life threatening emergency,  suicidal or homicidal thoughts you must seek medical attention immediately by calling 911 or calling your MD immediately  if symptoms less severe.  You must read complete instructions/literature along with all the possible adverse reactions/side effects for all the Medicines you take and that have been prescribed to you. Take any new Medicines after you have completely understood and accpet all the possible adverse reactions/side effects.   Do not drive when taking Pain medications.   Do not take more than prescribed Pain, Sleep and Anxiety Medications  Special Instructions: If you have smoked or chewed Tobacco  in the last 2 yrs please stop smoking, stop any regular Alcohol  and or any Recreational drug use.  Wear Seat belts while driving.  Please note  You were cared for by a hospitalist during your hospital stay. Once you are discharged, your primary care physician will handle any further medical issues. Please note that NO REFILLS for any discharge medications will be authorized once you are discharged, as it is imperative that you return to your primary care physician (or establish a relationship with a primary care physician if you do not have one) for your aftercare needs so that they can reassess your need for medications and monitor your lab values.   Diet recommendation: Heart Healthy diet  Discharge Instructions    (HEART FAILURE PATIENTS) Call MD:  Anytime you have any of the following symptoms: 1) 3 pound weight gain in 24 hours or 5 pounds in 1 week 2) shortness of breath, with or without a dry hacking cough 3) swelling in the hands, feet or stomach 4) if you have to sleep on extra pillows at night in order to breathe.    Complete by:  As directed      Call MD for:  difficulty breathing, headache or visual disturbances    Complete by:  As directed      Diet - low sodium heart healthy    Complete by:  As directed      Increase activity slowly    Complete by:  As directed              Follow-up Information    Follow up with [facility].   Why:  Registered Nurse.    Contact information:   [address] [place] [address] [phone]       Follow up with Philis Fendt, MD. Schedule an appointment as soon as possible for a visit in 1 week.   Specialty:  Internal Medicine   Contact information:   [place] [address] [phone]       Total Time spent on discharge equals  45 minutes.  SignedOren Binet 11/05/2015 8:33 AM

## 2015-11-05 NOTE — Progress Notes (Signed)
CM called [facility] rep, Tiffany to notify of discharge.  Pt has been set up with [facility] for [facility] by previous CM.  CM called [facility] DME rep, Merry Proud to please deliver the 3n1.  No other CM needs were communicated.

## 2016-10-31 ENCOUNTER — Emergency Department (HOSPITAL_COMMUNITY)
Admission: EM | Admit: 2016-10-31 | Discharge: 2016-10-31 | Disposition: A | Discharge status: Home / Self Care | Payer: Medicare Other | Attending: Emergency Medicine | Admitting: Emergency Medicine

## 2016-10-31 ENCOUNTER — Emergency Department (HOSPITAL_COMMUNITY): Payer: Medicare Other

## 2016-10-31 ENCOUNTER — Encounter (HOSPITAL_COMMUNITY): Payer: Self-pay | Admitting: *Deleted

## 2016-10-31 DIAGNOSIS — J45909 Unspecified asthma, uncomplicated: Secondary | ICD-10-CM | POA: Insufficient documentation

## 2016-10-31 DIAGNOSIS — Y9389 Activity, other specified: Secondary | ICD-10-CM | POA: Diagnosis not present

## 2016-10-31 DIAGNOSIS — Y92008 Other place in unspecified non-institutional (private) residence as the place of occurrence of the external cause: Secondary | ICD-10-CM | POA: Insufficient documentation

## 2016-10-31 DIAGNOSIS — W228XXA Striking against or struck by other objects, initial encounter: Secondary | ICD-10-CM | POA: Insufficient documentation

## 2016-10-31 DIAGNOSIS — S32010A Wedge compression fracture of first lumbar vertebra, initial encounter for closed fracture: Secondary | ICD-10-CM

## 2016-10-31 DIAGNOSIS — Y999 Unspecified external cause status: Secondary | ICD-10-CM | POA: Insufficient documentation

## 2016-10-31 DIAGNOSIS — S32018A Other fracture of first lumbar vertebra, initial encounter for closed fracture: Secondary | ICD-10-CM | POA: Insufficient documentation

## 2016-10-31 DIAGNOSIS — Z7982 Long term (current) use of aspirin: Secondary | ICD-10-CM | POA: Diagnosis not present

## 2016-10-31 DIAGNOSIS — S0083XA Contusion of other part of head, initial encounter: Secondary | ICD-10-CM | POA: Diagnosis not present

## 2016-10-31 DIAGNOSIS — Z79899 Other long term (current) drug therapy: Secondary | ICD-10-CM | POA: Insufficient documentation

## 2016-10-31 DIAGNOSIS — E039 Hypothyroidism, unspecified: Secondary | ICD-10-CM | POA: Insufficient documentation

## 2016-10-31 DIAGNOSIS — I5033 Acute on chronic diastolic (congestive) heart failure: Secondary | ICD-10-CM | POA: Insufficient documentation

## 2016-10-31 DIAGNOSIS — S3992XA Unspecified injury of lower back, initial encounter: Secondary | ICD-10-CM | POA: Diagnosis present

## 2016-10-31 DIAGNOSIS — I110 Hypertensive heart disease with heart failure: Secondary | ICD-10-CM | POA: Diagnosis not present

## 2016-10-31 DIAGNOSIS — S0990XA Unspecified injury of head, initial encounter: Secondary | ICD-10-CM | POA: Diagnosis not present

## 2016-10-31 LAB — BASIC METABOLIC PANEL
Anion gap: 7 (ref 5–15)
BUN: 28 mg/dL — AB (ref 6–20)
CO2: 27 mmol/L (ref 22–32)
CREATININE: 0.95 mg/dL (ref 0.44–1.00)
Calcium: 9.3 mg/dL (ref 8.9–10.3)
Chloride: 97 mmol/L — ABNORMAL LOW (ref 101–111)
GFR calc Af Amer: >60 mL/min (ref >60)
GFR calc non Af Amer: 57 mL/min — ABNORMAL LOW (ref >60)
GLUCOSE: 118 mg/dL — AB (ref 65–99)
POTASSIUM: 4.1 mmol/L (ref 3.5–5.1)
Sodium: 131 mmol/L — ABNORMAL LOW (ref 135–145)

## 2016-10-31 LAB — URINALYSIS, ROUTINE W REFLEX MICROSCOPIC
Bacteria, UA: NONE SEEN
Bilirubin Urine: NEGATIVE
Glucose, UA: NEGATIVE mg/dL
Hgb urine dipstick: NEGATIVE
KETONES UR: NEGATIVE mg/dL
Leukocytes, UA: NEGATIVE
Nitrite: NEGATIVE
PH: 5 (ref 5.0–8.0)
PROTEIN: 100 mg/dL — AB
SQUAMOUS EPITHELIAL / LPF: NONE SEEN
Specific Gravity, Urine: 1.018 (ref 1.005–1.030)

## 2016-10-31 LAB — CBC WITH DIFFERENTIAL/PLATELET
Basophils Absolute: 0 10*3/uL (ref 0.0–0.1)
Basophils Relative: 0 %
EOS ABS: 0 10*3/uL (ref 0.0–0.7)
EOS PCT: 0 %
HCT: 33.9 % — ABNORMAL LOW (ref 36.0–46.0)
Hemoglobin: 11.8 g/dL — ABNORMAL LOW (ref 12.0–15.0)
LYMPHS PCT: 18 %
Lymphs Abs: 1.2 10*3/uL (ref 0.7–4.0)
MCH: 30.7 pg (ref 26.0–34.0)
MCHC: 34.8 g/dL (ref 30.0–36.0)
MCV: 88.3 fL (ref 78.0–100.0)
MONOS PCT: 8 %
Monocytes Absolute: 0.5 10*3/uL (ref 0.1–1.0)
Neutro Abs: 4.9 10*3/uL (ref 1.7–7.7)
Neutrophils Relative %: 74 %
PLATELETS: 342 10*3/uL (ref 150–400)
RBC: 3.84 MIL/uL — ABNORMAL LOW (ref 3.87–5.11)
RDW: 13 % (ref 11.5–15.5)
WBC: 6.6 10*3/uL (ref 4.0–10.5)

## 2016-10-31 MED ORDER — HYDROCODONE-ACETAMINOPHEN 5-325 MG PO TABS: 1.0000 | ORAL_TABLET | ORAL | 0 refills | Status: DC | PRN

## 2016-10-31 MED ORDER — MORPHINE SULFATE (PF) 4 MG/ML IV SOLN
4.0000 mg | Freq: Once | INTRAVENOUS | Status: AC
  Administered 2016-10-31: 4 mg via INTRAVENOUS
  Filled 2016-10-31: qty 1

## 2016-10-31 MED ORDER — ONDANSETRON HCL 4 MG/2ML IJ SOLN
4.0000 mg | Freq: Once | INTRAMUSCULAR | Status: AC
  Administered 2016-10-31: 4 mg via INTRAVENOUS
  Filled 2016-10-31: qty 2

## 2016-10-31 MED ORDER — DOCUSATE SODIUM 100 MG PO CAPS: 100.0000 mg | ORAL_CAPSULE | Freq: Two times a day (BID) | ORAL | 0 refills | Status: DC

## 2016-10-31 NOTE — ED Notes (Signed)
PT given RX for back brace due to long wait with ortho. Family made aware of RX and DC teaching. NAD. Pt wheeled out and driven home by family.

## 2016-10-31 NOTE — ED Provider Notes (Signed)
[place] DEPT Provider Note   CSN: [id number] Arrival date & time: 10/31/16  1329     History   Chief Complaint Chief Complaint  Patient presents with  . Fall    HPI Katie Mosley is a 76 y.o. female.  Pt is a [place] speaking woman who speaks little [place].  The pt's son is translating.  The pt said she fell this morning at home while getting up to use the bathroom around 0600.  The pt hit her head on a bedside table.  The pt also hit her left lower back.  The pt has been able to walk since she fell.  She has no n/v/dizziness.  No loc.      Past Medical History:  Diagnosis Date  . Adrenal insufficiency ([place])   . Adrenal insufficiency ([place])    Dx'd [facility] 05/2015  . Asthma   . CHF (congestive heart failure) ([place])   . GERD (gastroesophageal reflux disease)   . Glaucoma   . Headache   . HTN (hypertension)   . Hypertension   . Hypothyroidism   . Shortness of breath dyspnea   . Thyroid disease    not know in detail    Patient Active Problem List   Diagnosis Date Noted  . Acute exacerbation of CHF (congestive heart failure) ([place]) 10/28/2015  . Constipation   . Acute on chronic diastolic heart failure ([place])   . Acute encephalopathy   . Altered mental status 08/08/2015  . 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 AKI (acute kidney injury) ([place]) 08/08/2015  . Hypothermia 02/15/2015  . Pulmonary hypertension 02/14/2015  . Elevated TSH 02/14/2015  . Hyperkalemia 02/14/2015  . Acute diastolic heart failure ([place]) 02/13/2015  . SOB (shortness of breath) 02/13/2015  . Pleuritic chest pain 02/13/2015  . GERD (gastroesophageal reflux disease)   . Asthma   . Congestive heart disease ([place])   . Asthma, mild intermittent   . Shortness of breath   . Ischemic colitis ([place]) 10/31/2014  . Hyponatremia 10/31/2014  . Benign essential HTN 10/31/2014  . Hypothyroidism 10/31/2014    Past Surgical History:  Procedure Laterality Date  . APPENDECTOMY    .  BACK SURGERY    . CAPSULOTOMY  08/23/2011   Procedure: MINOR CAPSULOTOMY;  Surgeon: Myrtha Mantis.;  Location: [place];  Service: Ophthalmology;  Laterality: Left;  YAG Capsulotomy Left Eye  . ESOPHAGOGASTRODUODENOSCOPY N/A 03/17/2013   Procedure: ESOPHAGOGASTRODUODENOSCOPY (EGD);  Surgeon: Irene Shipper, MD;  Location[facility] ENDOSCOPY;  Service: Endoscopy;  Laterality: N/A;  . LEFT CATARACT EXTRACTION     2007  . RIGHT CATARACT EXTRACTION     2006    OB History    Gravida Para Term Preterm AB Living   0 0 0 0 0     SAB TAB Ectopic Multiple Live Births   0 0 0           Home Medications    Prior to Admission medications   Medication Sig Start Date End Date Taking? Authorizing Provider  acetaminophen (TYLENOL) 325 MG tablet Take 325 mg by mouth every 6 (six) hours as needed for moderate pain.     Historical Provider, MD  amLODipine (NORVASC) 5 MG tablet Take 1 tablet (5 mg total) by mouth daily. 11/03/14   Eugenie Filler, MD  aspirin EC 81 MG tablet Take 81 mg by mouth daily.    Historical Provider, MD  docusate sodium (COLACE) 100 MG capsule Take 1 capsule (100  mg total) by mouth every 12 (twelve) hours. 10/31/16   Isla Pence, MD  furosemide (LASIX) 40 MG tablet Take 1 tablet (40 mg total) by mouth daily. 11/05/15   Shanker Kristeen Mans, MD  HYDROcodone-acetaminophen (NORCO/VICODIN) 5-325 MG tablet Take 1 tablet by mouth every 4 (four) hours as needed. 10/31/16   Isla Pence, MD  levothyroxine (SYNTHROID, LEVOTHROID) 50 MCG tablet Take 1 tablet (50 mcg total) by mouth daily before breakfast. 02/18/15   Velvet Bathe, MD  metoprolol succinate (TOPROL-XL) 50 MG 24 hr tablet Take 50 mg by mouth daily. 09/05/14   Historical Provider, MD  pantoprazole (PROTONIX) 40 MG tablet Take 1 tablet (40 mg total) by mouth daily. 11/05/15   Shanker Kristeen Mans, MD  potassium chloride 20 MEQ TBCR Take 20 mEq by mouth daily. 11/05/15   Shanker Kristeen Mans, MD    Family History History reviewed. No  pertinent family history.  Social History Social History  Substance Use Topics  . Smoking status: Never Smoker  . Smokeless tobacco: Not on file  . Alcohol use No     Allergies   Patient has no known allergies.   Review of Systems Review of Systems  HENT: Positive for facial swelling.   Musculoskeletal: Positive for back pain.  All other systems reviewed and are negative.    Physical Exam Updated Vital Signs BP 194/74   Pulse 61   Temp 98.4 F (36.9 C) (Oral)   Resp 18   SpO2 98%   Physical Exam  Constitutional: She is oriented to person, place, and time. She appears well-developed and well-nourished.  HENT:  Head:    Right Ear: External ear normal.  Left Ear: External ear normal.  Nose: Nose normal.  Mouth/Throat: Oropharynx is clear and moist.  Eyes:  I am unable to open the left eye lid due to the swelling.  Unable to visualize left eye.  Neck: Normal range of motion. Neck supple.  Cardiovascular: Normal rate, regular rhythm, normal heart sounds and intact distal pulses.   Pulmonary/Chest: Effort normal and breath sounds normal.  Abdominal: Soft. Bowel sounds are normal.  Musculoskeletal:       Arms: Neurological: She is alert and oriented to person, place, and time.  Skin: Skin is warm and dry.  Psychiatric: She has a normal mood and affect. Her behavior is normal. Judgment and thought content normal.  Nursing note and vitals reviewed.    ED Treatments / Results  Labs (all labs ordered are listed, but only abnormal results are displayed) Labs Reviewed  BASIC METABOLIC PANEL - Abnormal; Notable for the following:       Result Value   Sodium 131 (*)    Chloride 97 (*)    Glucose, Bld 118 (*)    BUN 28 (*)    GFR calc non Af Amer 57 (*)    All other components within normal limits  CBC WITH DIFFERENTIAL/PLATELET - Abnormal; Notable for the following:    RBC 3.84 (*)    Hemoglobin 11.8 (*)    HCT 33.9 (*)    All other components within normal  limits  URINALYSIS, ROUTINE W REFLEX MICROSCOPIC - Abnormal; Notable for the following:    APPearance HAZY (*)    Protein, ur 100 (*)    All other components within normal limits    EKG  EKG Interpretation None       Radiology Dg Lumbar Spine Complete  Result Date: 10/31/2016 CLINICAL DATA:  76 year old female with generalized lumbar spine  pain after falling at home yesterday EXAM: [facility] 4+ VIEW COMPARISON:  Prior lumbar spine radiographs 08/31/2010 ; prior chest x-ray 10/28/2015 FINDINGS: L1 compression fracture with approximately 50% height loss anteriorly. This represents an interval finding compared to 10/28/2015. Multilevel degenerative disc disease throughout the lumbar spine. There is exaggerated thoracolumbar kyphosis centered at L1. Atherosclerotic calcifications present in the abdominal aorta. Mild levoconvex scoliosis centered at L2-L3. The bones appear diffusely osteopenic. The visualized bowel gas pattern is unremarkable. IMPRESSION: 1. Age indeterminate L1 compression fracture with 50% height loss anteriorly and exaggerated thoracolumbar kyphosis. This represents an interval finding compared to 10/28/2015 and has occurred at some point over the past year. If there is concern for acute lumbar spine fracture, consider further evaluation with MRI. 2. Multilevel degenerative disc disease. 3. Levoconvex scoliosis centered at L2-L3. 4. The bones appear diffusely demineralized. Electronically Signed   By: Jacqulynn Cadet M.D.   On: 10/31/2016 16:35   Ct Head Wo Contrast  Result Date: 10/31/2016 CLINICAL DATA:  [place] and hit head on table, large hematoma to of left forehead EXAM: CT HEAD WITHOUT CONTRAST CT MAXILLOFACIAL WITHOUT CONTRAST TECHNIQUE: Multidetector CT imaging of the head and maxillofacial structures were performed using the standard protocol without intravenous contrast. Multiplanar CT image reconstructions of the maxillofacial structures were also generated.  COMPARISON:  MRI 08/08/2015, CT scan brain 08/07/2015 FINDINGS: CT HEAD FINDINGS Brain: No acute territorial infarction, intracranial hemorrhage or focal mass lesion is visualized. Stable parenchymal calcification in the left cerebellum. Mild atrophy. Mild periventricular white matter hypodensity consistent with small vessel ischemic changes. The ventricles are stable in size. 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 Vascular: No hyperdense vessels. Carotid artery calcifications. Vertebral artery calcifications. Skull: No skull fracture. Scattered lucent lesions in the calvarium are unchanged. Other: Large left frontal and periorbital hematoma. CT MAXILLOFACIAL FINDINGS Osseous: Mastoid air cells are grossly clear. Mandibular heads grossly normal in position. No mandibular fracture. Pterygoid plates and zygomatic arches appear intact. There is no nasal bone fracture. Poor dentition with multiple maxillary teeth root lucencies. Orbits: No evidence for orbital wall fracture. No intra or extraconal soft tissue abnormality. Sinuses: No acute fluid levels. Minimal mucosal thickening in the maxillary and ethmoid sinuses. No sinus wall fracture. Soft tissues: Large amount of left facial soft tissue swelling with large left periorbital and forehead hematoma, this extends across the midline and over the nasal bridge. There is mild right periorbital hematoma. IMPRESSION: 1. No definite CT evidence for acute intracranial abnormality. Small vessel ischemic changes of the white matter with atrophy. 2. No definite acute facial bone fracture. 3. Large left periorbital and forehead hematoma. Electronically Signed   By: Donavan Foil M.D.   On: 10/31/2016 17:10   Ct Maxillofacial Wo Contrast  Result Date: 10/31/2016 CLINICAL DATA:  [place] and hit head on table, large hematoma to of left forehead EXAM: CT HEAD WITHOUT CONTRAST CT MAXILLOFACIAL WITHOUT CONTRAST TECHNIQUE: Multidetector CT imaging of the head and maxillofacial  structures were performed using the standard protocol without intravenous contrast. Multiplanar CT image reconstructions of the maxillofacial structures were also generated. COMPARISON:  MRI 08/08/2015, CT scan brain 08/07/2015 FINDINGS: CT HEAD FINDINGS Brain: No acute territorial infarction, intracranial hemorrhage or focal mass lesion is visualized. Stable parenchymal calcification in the left cerebellum. Mild atrophy. Mild periventricular white matter hypodensity consistent with small vessel ischemic changes. The ventricles are stable in size. 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 Vascular: No hyperdense vessels. Carotid artery calcifications. Vertebral artery  calcifications. Skull: No skull fracture. Scattered lucent lesions in the calvarium are unchanged. Other: Large left frontal and periorbital hematoma. CT MAXILLOFACIAL FINDINGS Osseous: Mastoid air cells are grossly clear. Mandibular heads grossly normal in position. No mandibular fracture. Pterygoid plates and zygomatic arches appear intact. There is no nasal bone fracture. Poor dentition with multiple maxillary teeth root lucencies. Orbits: No evidence for orbital wall fracture. No intra or extraconal soft tissue abnormality. Sinuses: No acute fluid levels. Minimal mucosal thickening in the maxillary and ethmoid sinuses. No sinus wall fracture. Soft tissues: Large amount of left facial soft tissue swelling with large left periorbital and forehead hematoma, this extends across the midline and over the nasal bridge. There is mild right periorbital hematoma. IMPRESSION: 1. No definite CT evidence for acute intracranial abnormality. Small vessel ischemic changes of the white matter with atrophy. 2. No definite acute facial bone fracture. 3. Large left periorbital and forehead hematoma. Electronically Signed   By: Donavan Foil M.D.   On: 10/31/2016 17:10    Procedures Procedures (including critical care time)  Medications Ordered in ED Medications   morphine 4 MG/ML injection 4 mg (4 mg Intravenous Given 10/31/16 1603)  ondansetron (ZOFRAN) injection 4 mg (4 mg Intravenous Given 10/31/16 1603)     Initial Impression / Assessment and Plan / ED Course  I have reviewed the triage vital signs and the nursing notes.  Pertinent labs & imaging results that were available during my care of the patient were reviewed by me and considered in my medical decision making (see chart for details).     Pain has improved.  She knows to return if worse and to f/u with pcp.  Final Clinical Impressions(s) / ED Diagnoses   Final diagnoses:  Contusion of face, initial encounter  Closed compression fracture of first lumbar vertebra, initial encounter (HCC)    New Prescriptions New Prescriptions   DOCUSATE SODIUM (COLACE) 100 MG CAPSULE    Take 1 capsule (100 mg total) by mouth every 12 (twelve) hours.   HYDROCODONE-ACETAMINOPHEN (NORCO/VICODIN) 5-325 MG TABLET    Take 1 tablet by mouth every 4 (four) hours as needed.     Isla Pence, MD 10/31/16 [phone]

## 2016-10-31 NOTE — ED Notes (Signed)
Family denies change in pt mental status.

## 2016-10-31 NOTE — ED Notes (Addendum)
Patient transported to XR. 

## 2016-10-31 NOTE — ED Triage Notes (Addendum)
Per family, pt got up to use restroom this morning and fell, hit head on table. Denies loc. Has large hematoma to left forehead. Bruising and swelling noted to left eye and reports left side lower back pain. No blood thinners.

## 2016-10-31 NOTE — ED Notes (Signed)
Pt family at bedside reports that pt had a unwitnessed fall today at 0600, reporting that pt hit her head on small bedside table. Family reports trying ice at home but pt requesting to come to hospital. Pt presents with large hematoma, swelling, and bruising to face. Upon attempting to examine left pupil, unable to open pt eye due to swelling. Bruising around both eyes. Large bruise to lower left flank as well. Pt c/o back pain.

## 2017-10-15 ENCOUNTER — Encounter (HOSPITAL_COMMUNITY): Payer: Self-pay | Admitting: Emergency Medicine

## 2017-10-15 ENCOUNTER — Emergency Department (HOSPITAL_COMMUNITY)
Admission: EM | Admit: 2017-10-15 | Discharge: 2017-10-16 | Disposition: A | Discharge status: Home / Self Care | Payer: Medicare Other | Attending: Emergency Medicine | Admitting: Emergency Medicine

## 2017-10-15 DIAGNOSIS — R51 Headache: Secondary | ICD-10-CM | POA: Insufficient documentation

## 2017-10-15 DIAGNOSIS — I5033 Acute on chronic diastolic (congestive) heart failure: Secondary | ICD-10-CM | POA: Diagnosis not present

## 2017-10-15 DIAGNOSIS — J45909 Unspecified asthma, uncomplicated: Secondary | ICD-10-CM | POA: Diagnosis not present

## 2017-10-15 DIAGNOSIS — Z79899 Other long term (current) drug therapy: Secondary | ICD-10-CM | POA: Diagnosis not present

## 2017-10-15 DIAGNOSIS — I110 Hypertensive heart disease with heart failure: Secondary | ICD-10-CM | POA: Diagnosis not present

## 2017-10-15 DIAGNOSIS — M6281 Muscle weakness (generalized): Secondary | ICD-10-CM | POA: Diagnosis present

## 2017-10-15 DIAGNOSIS — Z7982 Long term (current) use of aspirin: Secondary | ICD-10-CM | POA: Insufficient documentation

## 2017-10-15 DIAGNOSIS — N289 Disorder of kidney and ureter, unspecified: Secondary | ICD-10-CM | POA: Diagnosis not present

## 2017-10-15 DIAGNOSIS — R5383 Other fatigue: Secondary | ICD-10-CM | POA: Insufficient documentation

## 2017-10-15 DIAGNOSIS — E039 Hypothyroidism, unspecified: Secondary | ICD-10-CM | POA: Diagnosis not present

## 2017-10-15 LAB — BASIC METABOLIC PANEL
Anion gap: 11 (ref 5–15)
BUN: 75 mg/dL — AB (ref 6–20)
CHLORIDE: 100 mmol/L — AB (ref 101–111)
CO2: 25 mmol/L (ref 22–32)
CREATININE: 1.98 mg/dL — AB (ref 0.44–1.00)
Calcium: 8.7 mg/dL — ABNORMAL LOW (ref 8.9–10.3)
GFR calc Af Amer: 27 mL/min — ABNORMAL LOW (ref >60)
GFR calc non Af Amer: 23 mL/min — ABNORMAL LOW (ref >60)
GLUCOSE: 116 mg/dL — AB (ref 65–99)
POTASSIUM: 4.5 mmol/L (ref 3.5–5.1)
SODIUM: 136 mmol/L (ref 135–145)

## 2017-10-15 LAB — CBC
HEMATOCRIT: 26.6 % — AB (ref 36.0–46.0)
Hemoglobin: 9 g/dL — ABNORMAL LOW (ref 12.0–15.0)
MCH: 30.3 pg (ref 26.0–34.0)
MCHC: 33.8 g/dL (ref 30.0–36.0)
MCV: 89.6 fL (ref 78.0–100.0)
PLATELETS: 218 10*3/uL (ref 150–400)
RBC: 2.97 MIL/uL — ABNORMAL LOW (ref 3.87–5.11)
RDW: 15 % (ref 11.5–15.5)
WBC: 6.8 10*3/uL (ref 4.0–10.5)

## 2017-10-15 LAB — URINALYSIS, ROUTINE W REFLEX MICROSCOPIC
Bilirubin Urine: NEGATIVE
GLUCOSE, UA: NEGATIVE mg/dL
Hgb urine dipstick: NEGATIVE
KETONES UR: NEGATIVE mg/dL
LEUKOCYTES UA: NEGATIVE
Nitrite: NEGATIVE
PH: 6 (ref 5.0–8.0)
Protein, ur: 100 mg/dL — AB
Specific Gravity, Urine: 1.013 (ref 1.005–1.030)

## 2017-10-15 LAB — CBG MONITORING, ED: Glucose-Capillary: 99 mg/dL (ref 65–99)

## 2017-10-15 NOTE — ED Triage Notes (Signed)
Pt reports gen weakness since Sunday. Denies any other complaints. No N/V/D, SOB, CP, HA, etc.

## 2017-10-16 ENCOUNTER — Encounter (HOSPITAL_COMMUNITY): Payer: Self-pay | Admitting: Emergency Medicine

## 2017-10-16 ENCOUNTER — Emergency Department (HOSPITAL_COMMUNITY): Payer: Medicare Other

## 2017-10-16 DIAGNOSIS — N289 Disorder of kidney and ureter, unspecified: Secondary | ICD-10-CM | POA: Diagnosis not present

## 2017-10-16 LAB — I-STAT TROPONIN, ED: TROPONIN I, POC: 0 ng/mL (ref 0.00–0.08)

## 2017-10-16 MED ORDER — NALOXONE HCL 4 MG/0.1ML NA LIQD: 0.4000 mg | Freq: Once | NASAL | Status: DC

## 2017-10-16 MED ORDER — NALOXONE HCL 2 MG/2ML IJ SOSY
PREFILLED_SYRINGE | INTRAMUSCULAR | Status: AC
  Administered 2017-10-16: 0.4 mg via NASAL
  Filled 2017-10-16: qty 2

## 2017-10-16 MED ORDER — SODIUM CHLORIDE 0.9 % IV BOLUS (SEPSIS): 500.0000 mL | Freq: Once | INTRAVENOUS | Status: DC

## 2017-10-16 NOTE — ED Notes (Signed)
Patient transported to X-ray 

## 2017-10-16 NOTE — ED Provider Notes (Signed)
[facility] EMERGENCY DEPARTMENT Provider Note   CSN: [id number] Arrival date & time: 10/15/17  1904     History   Chief Complaint Chief Complaint  Patient presents with  . Weakness    HPI Katie Mosley is a 77 y.o. female.  The history is provided by the patient.  Weakness  Primary symptoms include no dizziness. Primary symptoms comment: fatigue. This is a new problem. The current episode started more than 2 days ago. The problem has not changed since onset.There was no focality noted. There has been no fever. Pertinent negatives include no shortness of breath, no chest pain, no vomiting, no altered mental status, no confusion and no headaches. There were no medications administered prior to arrival. Associated medical issues do not include trauma, seizures or dementia.    Past Medical History:  Diagnosis Date  . Adrenal insufficiency ([place])   . Adrenal insufficiency ([place])    Dx'd [facility] 05/2015  . Asthma   . CHF (congestive heart failure) ([place])   . GERD (gastroesophageal reflux disease)   . Glaucoma   . Headache   . HTN (hypertension)   . Hypertension   . Hypothyroidism   . Shortness of breath dyspnea   . Thyroid disease    not know in detail    Patient Active Problem List   Diagnosis Date Noted  . Acute exacerbation of CHF (congestive heart failure) ([place]) 10/28/2015  . Constipation   . Acute on chronic diastolic heart failure ([place])   . Acute encephalopathy   . Altered mental status 08/08/2015  . 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 AKI (acute kidney injury) ([place]) 08/08/2015  . Hypothermia 02/15/2015  . Pulmonary hypertension ([place]) 02/14/2015  . Elevated TSH 02/14/2015  . Hyperkalemia 02/14/2015  . Acute diastolic heart failure ([place]) 02/13/2015  . SOB (shortness of breath) 02/13/2015  . Pleuritic chest pain 02/13/2015  . GERD (gastroesophageal reflux disease)   . Asthma   . Congestive heart disease ([place])   . Asthma,  mild intermittent   . Shortness of breath   . Ischemic colitis ([place]) 10/31/2014  . Hyponatremia 10/31/2014  . Benign essential HTN 10/31/2014  . Hypothyroidism 10/31/2014    Past Surgical History:  Procedure Laterality Date  . APPENDECTOMY    . BACK SURGERY    . CAPSULOTOMY  08/23/2011   Procedure: MINOR CAPSULOTOMY;  Surgeon: Myrtha Mantis.;  Location: [facility] [place];  Service: Ophthalmology;  Laterality: Left;  YAG Capsulotomy Left Eye  . ESOPHAGOGASTRODUODENOSCOPY N/A 03/17/2013   Procedure: ESOPHAGOGASTRODUODENOSCOPY (EGD);  Surgeon: Irene Shipper, MD;  Location: [facility] ENDOSCOPY;  Service: Endoscopy;  Laterality: N/A;  . LEFT CATARACT EXTRACTION     2007  . RIGHT CATARACT EXTRACTION     2006    OB History    Gravida Para Term Preterm AB Living   0 0 0 0 0     SAB TAB Ectopic Multiple Live Births   0 0 0           Home Medications    Prior to Admission medications   Medication Sig Start Date End Date Taking? Authorizing Provider  acetaminophen (TYLENOL) 325 MG tablet Take 325 mg by mouth every 6 (six) hours as needed for moderate pain.    Yes [provider]  aspirin EC 81 MG tablet Take 81 mg by mouth daily.   Yes [provider]  docusate sodium (COLACE) 100 MG capsule Take 1 capsule (100 mg total) by mouth  every 12 (twelve) hours. Patient taking differently: Take 100 mg by mouth daily as needed for mild constipation.  10/31/16  Yes Isla Pence, MD  HYDROcodone-acetaminophen (NORCO/VICODIN) 5-325 MG tablet Take 1 tablet by mouth every 4 (four) hours as needed. Patient taking differently: Take 1 tablet by mouth every 4 (four) hours as needed for moderate pain.  10/31/16  Yes Isla Pence, MD  levothyroxine (SYNTHROID, LEVOTHROID) 50 MCG tablet Take 1 tablet (50 mcg total) by mouth daily before breakfast. 02/18/15  Yes Velvet Bathe, MD  metoprolol succinate (TOPROL-XL) 50 MG 24 hr tablet Take 50 mg by mouth daily. 09/05/14  Yes [provider]    pantoprazole (PROTONIX) 40 MG tablet Take 1 tablet (40 mg total) by mouth daily. 11/05/15  Yes Ghimire, Henreitta Leber, MD    Family History No family history on file.  Social History Social History   Tobacco Use  . Smoking status: Never Smoker  . Smokeless tobacco: Never Used  Substance Use Topics  . Alcohol use: No  . Drug use: No     Allergies   Patient has no known allergies.   Review of Systems Review of Systems  Constitutional: Positive for fatigue. Negative for appetite change and diaphoresis.  Respiratory: Negative for cough and shortness of breath.   Cardiovascular: Negative for chest pain, palpitations and leg swelling.  Gastrointestinal: Negative for abdominal pain and vomiting.  Genitourinary: Negative for dysuria.  Neurological: Positive for weakness. Negative for dizziness, facial asymmetry, speech difficulty, light-headedness and headaches.  Psychiatric/Behavioral: Negative for confusion.  All other systems reviewed and are negative.    Physical Exam Updated Vital Signs BP (!) 161/57   Pulse (!) 52   Temp 97.9 F (36.6 C) (Oral)   Resp 10   Ht 5\' 2"  (1.575 m)   Wt 49.9 kg (110 lb)   SpO2 100%   BMI 20.12 kg/m   Physical Exam  Constitutional: Katie Mosley appears well-developed and well-nourished. No distress.  HENT:  Head: Normocephalic and atraumatic.  Right Ear: External ear normal.  Left Ear: External ear normal.  Nose: Nose normal.  Mouth/Throat: Oropharynx is clear and moist. No oropharyngeal exudate.  Eyes: Conjunctivae and EOM are normal. Pupils are equal, round, and reactive to light.  Neck: Normal range of motion. Neck supple.  Cardiovascular: Normal rate, regular rhythm, normal heart sounds and intact distal pulses.  Pulmonary/Chest: Effort normal and breath sounds normal. No stridor. No respiratory distress. Katie Mosley has no wheezes. Katie Mosley has no rales.  Abdominal: Soft. Bowel sounds are normal. Katie Mosley exhibits no mass. There is no tenderness. There is no  rebound and no guarding.  Musculoskeletal: Normal range of motion.  Neurological: Katie Mosley is alert. Katie Mosley displays normal reflexes.  Skin: Skin is warm and dry. Capillary refill takes less than 2 seconds.  Psychiatric: Katie Mosley has a normal mood and affect.     ED Treatments / Results  Labs (all labs ordered are listed, but only abnormal results are displayed) Labs Reviewed  BASIC METABOLIC PANEL - Abnormal; Notable for the following components:      Result Value   Chloride 100 (*)    Glucose, Bld 116 (*)    BUN 75 (*)    Creatinine, Ser 1.98 (*)    Calcium 8.7 (*)    GFR calc non Af Amer 23 (*)    GFR calc Af Amer 27 (*)    All other components within normal limits  CBC - Abnormal; Notable for the following components:   RBC 2.97 (*)  Hemoglobin 9.0 (*)    HCT 26.6 (*)    All other components within normal limits  URINALYSIS, ROUTINE W REFLEX MICROSCOPIC - Abnormal; Notable for the following components:   Protein, ur 100 (*)    Bacteria, UA RARE (*)    Squamous Epithelial / LPF 0-5 (*)    All other components within normal limits  CBG MONITORING, ED  I-STAT TROPONIN, ED    EKG  EKG Interpretation  Date/Time:  Wednesday October 16 2017 01:39:16 EST Ventricular Rate:  56 PR Interval:    QRS Duration: 84 QT Interval:  449 QTC Calculation: 434 R Axis:   29 Text Interpretation:  Sinus rhythm Borderline prolonged PR interval Confirmed by Dory Horn) on 10/16/2017 1:56:15 AM       Radiology Dg Chest 2 View  Result Date: 10/16/2017 CLINICAL DATA:  Shortness of breath. EXAM: CHEST  2 VIEW COMPARISON:  Radiographs 10/28/2015 FINDINGS: Mild cardiomegaly. Unchanged mediastinal contours. Aortic atherosclerosis. Central vascular congestion with improved pulmonary edema from prior exam. Subsegmental atelectasis at the left lung base. No consolidation. No pleural fluid or pneumothorax. Scoliosis and degenerative change in the spine. Progressive loss of height of L1 vertebral  body from 10/31/2016 lumbar spine radiographs. IMPRESSION: 1. Cardiomegaly with vascular congestion. Subsegmental left basilar atelectasis. 2. Progressive L1 compression fracture since March 2018 radiograph. 3. Aortic Atherosclerosis (ICD10-I70.0). Electronically Signed   By: Jeb Levering M.D.   On: 10/16/2017 01:35   Ct Head Wo Contrast  Result Date: 10/16/2017 CLINICAL DATA:  Generalized weakness for 3 days. History of hypertension, headache. EXAM: CT HEAD WITHOUT CONTRAST TECHNIQUE: Contiguous axial images were obtained from the base of the skull through the vertex without intravenous contrast. COMPARISON:  CT HEAD October 31, 2016 FINDINGS: Old BRAIN: No intraparenchymal hemorrhage, mass effect nor midline shift. The ventricles and sulci are normal for age. Patchy comp supratentorial white matter hypodensities. Old RIGHT caudate head lacunar infarct. Unchanged subcentimeter calcification LEFT cerebellum. No acute large vascular territory infarcts. No abnormal extra-axial fluid collections. Basal cisterns are patent. VASCULAR: Moderate calcific atherosclerosis of the carotid siphons. SKULL: No skull fracture. No significant scalp soft tissue swelling. LEFT frontal scalp scarring. SINUSES/ORBITS: The mastoid air-cells and included paranasal sinuses are well-aerated.The included ocular globes and orbital contents are non-suspicious. Status post bilateral ocular lens implants, mild enophthalmos. OTHER: None. IMPRESSION: 1. No acute intracranial process. 2. 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 3. Coarse calcification LEFT cerebellum suggesting cavernoma. Electronically Signed   By: Elon Alas M.D.   On: 10/16/2017 01:58    Procedures Procedures (including critical care time)  Medications Ordered in ED Medications  naloxone (NARCAN) nasal spray 4 mg/0.1 mL (not administered)    Orthostatic VS for the past 24 hrs:  BP- Lying Pulse- Lying BP-  Sitting Pulse- Sitting BP- Standing at 0 minutes Pulse- Standing at 0 minutes  10/16/17 0228 162/51 54 150/62 53 152/62 51     Final Clinical Impressions(s) / ED Diagnoses   Final diagnoses:  Renal insufficiency    Return for weakness, numbness, changes in vision or speech,  fevers > 100.4 unrelieved by medication, shortness of breath, intractable vomiting, or diarrhea, abdominal pain, Inability to tolerate liquids or food, cough, altered mental status or any concerns. No signs of systemic illness or infection. The patient is nontoxic-appearing on exam and vital signs are within normal limits.    I have reviewed the triage vital signs and the nursing notes. Pertinent labs &  imaging results that were available during my care of the patient were reviewed by me and considered in my medical decision making (see chart for details).  After history, exam, and medical workup I feel the patient has been appropriately medically screened and is safe for discharge home. Pertinent diagnoses were discussed with the patient. Patient was given return precautions.     Mckaylee Dimalanta, MD 10/16/17 1410

## 2017-10-16 NOTE — ED Notes (Signed)
Pt departed in NAD.  

## 2017-10-16 NOTE — ED Notes (Signed)
Delay in lab draw,  Pt not in room at this time. 

## 2017-10-16 NOTE — ED Notes (Signed)
Pt.ambulate with hand handle assit.up the hallway and back  To  The room

## 2017-10-16 NOTE — ED Notes (Signed)
No change noted in pt. MD aware.

## 2018-03-12 ENCOUNTER — Other Ambulatory Visit: Payer: Self-pay | Admitting: Internal Medicine

## 2018-03-12 DIAGNOSIS — E2839 Other primary ovarian failure: Secondary | ICD-10-CM

## 2018-06-16 ENCOUNTER — Encounter (HOSPITAL_COMMUNITY): Payer: Self-pay | Admitting: Emergency Medicine

## 2018-06-16 ENCOUNTER — Emergency Department (HOSPITAL_COMMUNITY)
Admission: EM | Admit: 2018-06-16 | Discharge: 2018-06-16 | Disposition: A | Discharge status: Home / Self Care | Payer: Medicare Other | Attending: Emergency Medicine | Admitting: Emergency Medicine

## 2018-06-16 DIAGNOSIS — R1011 Right upper quadrant pain: Secondary | ICD-10-CM | POA: Diagnosis not present

## 2018-06-16 DIAGNOSIS — K5900 Constipation, unspecified: Secondary | ICD-10-CM | POA: Insufficient documentation

## 2018-06-16 DIAGNOSIS — R5383 Other fatigue: Secondary | ICD-10-CM | POA: Insufficient documentation

## 2018-06-16 DIAGNOSIS — Z5321 Procedure and treatment not carried out due to patient leaving prior to being seen by health care provider: Secondary | ICD-10-CM | POA: Insufficient documentation

## 2018-06-16 DIAGNOSIS — R51 Headache: Secondary | ICD-10-CM | POA: Diagnosis not present

## 2018-06-16 LAB — CBC
HEMATOCRIT: 24.9 % — AB (ref 36.0–46.0)
Hemoglobin: 8.1 g/dL — ABNORMAL LOW (ref 12.0–15.0)
MCH: 29.8 pg (ref 26.0–34.0)
MCHC: 32.5 g/dL (ref 30.0–36.0)
MCV: 91.5 fL (ref 80.0–100.0)
PLATELETS: 274 10*3/uL (ref 150–400)
RBC: 2.72 MIL/uL — AB (ref 3.87–5.11)
RDW: 15.4 % (ref 11.5–15.5)
WBC: 3 10*3/uL — ABNORMAL LOW (ref 4.0–10.5)
nRBC: 0 % (ref 0.0–0.2)

## 2018-06-16 LAB — URINALYSIS, ROUTINE W REFLEX MICROSCOPIC
Bilirubin Urine: NEGATIVE
GLUCOSE, UA: NEGATIVE mg/dL
Hgb urine dipstick: NEGATIVE
Ketones, ur: NEGATIVE mg/dL
LEUKOCYTES UA: NEGATIVE
NITRITE: NEGATIVE
PH: 7 (ref 5.0–8.0)
Protein, ur: 100 mg/dL — AB
SPECIFIC GRAVITY, URINE: 1.009 (ref 1.005–1.030)

## 2018-06-16 LAB — COMPREHENSIVE METABOLIC PANEL
ALT: 25 U/L (ref 0–44)
ANION GAP: 7 (ref 5–15)
AST: 54 U/L — ABNORMAL HIGH (ref 15–41)
Albumin: 2.9 g/dL — ABNORMAL LOW (ref 3.5–5.0)
Alkaline Phosphatase: 75 U/L (ref 38–126)
BUN: 49 mg/dL — ABNORMAL HIGH (ref 8–23)
CHLORIDE: 98 mmol/L (ref 98–111)
CO2: 23 mmol/L (ref 22–32)
Calcium: 8.5 mg/dL — ABNORMAL LOW (ref 8.9–10.3)
Creatinine, Ser: 1.8 mg/dL — ABNORMAL HIGH (ref 0.44–1.00)
GFR calc non Af Amer: 26 mL/min — ABNORMAL LOW (ref >60)
GFR, EST AFRICAN AMERICAN: 30 mL/min — AB (ref >60)
Glucose, Bld: 85 mg/dL (ref 70–99)
Potassium: 4.5 mmol/L (ref 3.5–5.1)
SODIUM: 128 mmol/L — AB (ref 135–145)
Total Bilirubin: 0.6 mg/dL (ref 0.3–1.2)
Total Protein: 6.6 g/dL (ref 6.5–8.1)

## 2018-06-16 LAB — LIPASE, BLOOD: Lipase: 71 U/L — ABNORMAL HIGH (ref 11–51)

## 2018-06-16 NOTE — ED Triage Notes (Addendum)
Pt speaks somali. Pt here for eval of 5 days of constipation, generalized weakness and fatigue. Family reports she is wanting to sleep all day. Alert and oriented at her baseline. No fevers. Denies nausea, cough, or painful urination. Pt also has a headache. Pt also endorses RUQ abdominal pain for 1 week. States her doctor told her it was "from constipation."

## 2018-06-22 ENCOUNTER — Emergency Department (HOSPITAL_COMMUNITY): Payer: Medicare Other

## 2018-06-22 ENCOUNTER — Emergency Department (HOSPITAL_COMMUNITY)
Admission: EM | Admit: 2018-06-22 | Discharge: 2018-06-22 | Disposition: A | Discharge status: Home / Self Care | Payer: Medicare Other | Attending: Emergency Medicine | Admitting: Emergency Medicine

## 2018-06-22 ENCOUNTER — Encounter (HOSPITAL_COMMUNITY): Payer: Self-pay | Admitting: Emergency Medicine

## 2018-06-22 DIAGNOSIS — R5383 Other fatigue: Secondary | ICD-10-CM | POA: Insufficient documentation

## 2018-06-22 DIAGNOSIS — K5900 Constipation, unspecified: Secondary | ICD-10-CM | POA: Insufficient documentation

## 2018-06-22 DIAGNOSIS — E039 Hypothyroidism, unspecified: Secondary | ICD-10-CM | POA: Diagnosis not present

## 2018-06-22 DIAGNOSIS — Y929 Unspecified place or not applicable: Secondary | ICD-10-CM | POA: Insufficient documentation

## 2018-06-22 DIAGNOSIS — Z7982 Long term (current) use of aspirin: Secondary | ICD-10-CM | POA: Diagnosis not present

## 2018-06-22 DIAGNOSIS — I5032 Chronic diastolic (congestive) heart failure: Secondary | ICD-10-CM | POA: Diagnosis not present

## 2018-06-22 DIAGNOSIS — R109 Unspecified abdominal pain: Secondary | ICD-10-CM | POA: Diagnosis not present

## 2018-06-22 DIAGNOSIS — I110 Hypertensive heart disease with heart failure: Secondary | ICD-10-CM | POA: Insufficient documentation

## 2018-06-22 DIAGNOSIS — Y939 Activity, unspecified: Secondary | ICD-10-CM | POA: Diagnosis not present

## 2018-06-22 DIAGNOSIS — S22080A Wedge compression fracture of T11-T12 vertebra, initial encounter for closed fracture: Secondary | ICD-10-CM | POA: Insufficient documentation

## 2018-06-22 DIAGNOSIS — S299XXA Unspecified injury of thorax, initial encounter: Secondary | ICD-10-CM | POA: Diagnosis present

## 2018-06-22 DIAGNOSIS — Z79899 Other long term (current) drug therapy: Secondary | ICD-10-CM | POA: Insufficient documentation

## 2018-06-22 DIAGNOSIS — Y999 Unspecified external cause status: Secondary | ICD-10-CM | POA: Diagnosis not present

## 2018-06-22 DIAGNOSIS — R531 Weakness: Secondary | ICD-10-CM | POA: Diagnosis not present

## 2018-06-22 DIAGNOSIS — W19XXXA Unspecified fall, initial encounter: Secondary | ICD-10-CM | POA: Diagnosis not present

## 2018-06-22 DIAGNOSIS — R509 Fever, unspecified: Secondary | ICD-10-CM | POA: Diagnosis not present

## 2018-06-22 DIAGNOSIS — J45909 Unspecified asthma, uncomplicated: Secondary | ICD-10-CM | POA: Diagnosis not present

## 2018-06-22 DIAGNOSIS — M25551 Pain in right hip: Secondary | ICD-10-CM | POA: Insufficient documentation

## 2018-06-22 LAB — CBC WITH DIFFERENTIAL/PLATELET
Abs Immature Granulocytes: 0.01 10*3/uL (ref 0.00–0.07)
Basophils Absolute: 0 10*3/uL (ref 0.0–0.1)
Basophils Relative: 0 %
Eosinophils Absolute: 0 10*3/uL (ref 0.0–0.5)
Eosinophils Relative: 1 %
HCT: 24.6 % — ABNORMAL LOW (ref 36.0–46.0)
Hemoglobin: 7.9 g/dL — ABNORMAL LOW (ref 12.0–15.0)
Immature Granulocytes: 0 %
Lymphocytes Relative: 30 %
Lymphs Abs: 1 10*3/uL (ref 0.7–4.0)
MCH: 29.5 pg (ref 26.0–34.0)
MCHC: 32.1 g/dL (ref 30.0–36.0)
MCV: 91.8 fL (ref 80.0–100.0)
Monocytes Absolute: 0.4 10*3/uL (ref 0.1–1.0)
Monocytes Relative: 12 %
Neutro Abs: 1.8 10*3/uL (ref 1.7–7.7)
Neutrophils Relative %: 57 %
Platelets: 276 10*3/uL (ref 150–400)
RBC: 2.68 MIL/uL — ABNORMAL LOW (ref 3.87–5.11)
RDW: 14.3 % (ref 11.5–15.5)
WBC: 3.3 10*3/uL — ABNORMAL LOW (ref 4.0–10.5)
nRBC: 0 % (ref 0.0–0.2)

## 2018-06-22 LAB — TSH: TSH: 4.693 u[IU]/mL — ABNORMAL HIGH (ref 0.350–4.500)

## 2018-06-22 LAB — TYPE AND SCREEN
ABO/RH(D): A POS
Antibody Screen: NEGATIVE

## 2018-06-22 LAB — URINALYSIS, ROUTINE W REFLEX MICROSCOPIC
Bacteria, UA: NONE SEEN
Bilirubin Urine: NEGATIVE
Glucose, UA: NEGATIVE mg/dL
Hgb urine dipstick: NEGATIVE
Ketones, ur: NEGATIVE mg/dL
Leukocytes, UA: NEGATIVE
Nitrite: NEGATIVE
Protein, ur: 100 mg/dL — AB
Specific Gravity, Urine: 1.008 (ref 1.005–1.030)
pH: 6 (ref 5.0–8.0)

## 2018-06-22 LAB — POC OCCULT BLOOD, ED: Fecal Occult Bld: NEGATIVE

## 2018-06-22 LAB — ABO/RH: ABO/RH(D): A POS

## 2018-06-22 LAB — COMPREHENSIVE METABOLIC PANEL
ALT: 23 U/L (ref 0–44)
AST: 41 U/L (ref 15–41)
Albumin: 2.7 g/dL — ABNORMAL LOW (ref 3.5–5.0)
Alkaline Phosphatase: 73 U/L (ref 38–126)
Anion gap: 7 (ref 5–15)
BUN: 25 mg/dL — ABNORMAL HIGH (ref 8–23)
CO2: 23 mmol/L (ref 22–32)
Calcium: 8.4 mg/dL — ABNORMAL LOW (ref 8.9–10.3)
Chloride: 96 mmol/L — ABNORMAL LOW (ref 98–111)
Creatinine, Ser: 1.36 mg/dL — ABNORMAL HIGH (ref 0.44–1.00)
GFR calc Af Amer: 42 mL/min — ABNORMAL LOW (ref >60)
GFR calc non Af Amer: 36 mL/min — ABNORMAL LOW (ref >60)
Glucose, Bld: 109 mg/dL — ABNORMAL HIGH (ref 70–99)
Potassium: 4.4 mmol/L (ref 3.5–5.1)
Sodium: 126 mmol/L — ABNORMAL LOW (ref 135–145)
Total Bilirubin: 0.6 mg/dL (ref 0.3–1.2)
Total Protein: 6.1 g/dL — ABNORMAL LOW (ref 6.5–8.1)

## 2018-06-22 LAB — LIPASE, BLOOD: Lipase: 38 U/L (ref 11–51)

## 2018-06-22 MED ORDER — IOPAMIDOL (ISOVUE-300) INJECTION 61%
30.0000 mL | Freq: Two times a day (BID) | INTRAVENOUS | Status: AC | PRN
  Administered 2018-06-22: 30 mL via ORAL

## 2018-06-22 MED ORDER — SODIUM CHLORIDE 0.9 % IV BOLUS
500.0000 mL | Freq: Once | INTRAVENOUS | Status: AC
  Administered 2018-06-22: 500 mL via INTRAVENOUS

## 2018-06-22 NOTE — ED Notes (Signed)
Pt ambulated in Pod B hallway, tolerated well with one assist. Had to hold on to something in order to walk.

## 2018-06-22 NOTE — ED Notes (Signed)
Pt transported to CT ?

## 2018-06-22 NOTE — ED Provider Notes (Signed)
[facility] EMERGENCY DEPARTMENT Provider Note   CSN: [id number] Arrival date & time: 06/22/18  3149     History   Chief Complaint Chief Complaint  Patient presents with  . Weakness    HPI Katie Mosley is a 77 y.o. female with history of hypothyroidism, adrenal insufficiency, CHF, hypertension, asthma who presents with a 2 to 3-week history of weakness and fatigue.  Patient also has been complaining of right hip pain after a fall 3 to 4 weeks ago.  Patient initially had x-rays at PCP, patient continued to have pain.  She is able to ambulate.  She has also had some problems with constipation, however patient had a good, normal bowel movement this morning.  She has been taking Linzess, per her PCP.  Patient's son reports there was no blood visualized.  Patient has not been complaining of chest pain or shortness of breath.  Patient son reported 100.3 temperature at home prior to arrival.  She received Tylenol.  He reports her temperature was 95 yesterday.  He had turned up the heat and cover her with blankets.  He says this may have caused her high temperature.  History is obtained by patient's son as interpretter, who is to caretaker.  Patient does not speak [place]. Offered professional interpreter and patient declined.  HPI  Past Medical History:  Diagnosis Date  . Adrenal insufficiency ([place])   . Adrenal insufficiency ([place])    Dx'd [facility] 05/2015  . Asthma   . CHF (congestive heart failure) ([place])   . GERD (gastroesophageal reflux disease)   . Glaucoma   . Headache   . HTN (hypertension)   . Hypertension   . Hypothyroidism   . Shortness of breath dyspnea   . Thyroid disease    not know in detail    Patient Active Problem List   Diagnosis Date Noted  . Acute exacerbation of CHF (congestive heart failure) ([place]) 10/28/2015  . Constipation   . Acute on chronic diastolic heart failure ([place])   . Acute encephalopathy   . Altered mental status 08/08/2015    . 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 AKI (acute kidney injury) ([place]) 08/08/2015  . Hypothermia 02/15/2015  . Pulmonary hypertension ([place]) 02/14/2015  . Elevated TSH 02/14/2015  . Hyperkalemia 02/14/2015  . Acute diastolic heart failure ([place]) 02/13/2015  . SOB (shortness of breath) 02/13/2015  . Pleuritic chest pain 02/13/2015  . GERD (gastroesophageal reflux disease)   . Asthma   . Congestive heart disease ([place])   . Asthma, mild intermittent   . Shortness of breath   . Ischemic colitis ([place]) 10/31/2014  . Hyponatremia 10/31/2014  . Benign essential HTN 10/31/2014  . Hypothyroidism 10/31/2014    Past Surgical History:  Procedure Laterality Date  . APPENDECTOMY    . BACK SURGERY    . CAPSULOTOMY  08/23/2011   Procedure: MINOR CAPSULOTOMY;  Surgeon: Myrtha Mantis.;  Location: [facility];  Service: Ophthalmology;  Laterality: Left;  YAG Capsulotomy Left Eye  . ESOPHAGOGASTRODUODENOSCOPY N/A 03/17/2013   Procedure: ESOPHAGOGASTRODUODENOSCOPY (EGD);  Surgeon: Irene Shipper, MD;  Location: [facility] ENDOSCOPY;  Service: Endoscopy;  Laterality: N/A;  . LEFT CATARACT EXTRACTION     2007  . RIGHT CATARACT EXTRACTION     2006     OB History    Gravida  0   Para  0   Term  0   Preterm  0   AB  0   Living  SAB  0   TAB  0   Ectopic  0   Multiple      Live Births               Home Medications    Prior to Admission medications   Medication Sig Start Date End Date Taking? Authorizing Provider  acetaminophen (TYLENOL) 325 MG tablet Take 325 mg by mouth every 6 (six) hours as needed for moderate pain.     [provider]  aspirin EC 81 MG tablet Take 81 mg by mouth daily.    [provider]  docusate sodium (COLACE) 100 MG capsule Take 1 capsule (100 mg total) by mouth every 12 (twelve) hours. Patient taking differently: Take 100 mg by mouth daily as needed for mild constipation.  10/31/16   Isla Pence, MD   HYDROcodone-acetaminophen (NORCO/VICODIN) 5-325 MG tablet Take 1 tablet by mouth every 4 (four) hours as needed. Patient taking differently: Take 1 tablet by mouth every 4 (four) hours as needed for moderate pain.  10/31/16   Isla Pence, MD  levothyroxine (SYNTHROID, LEVOTHROID) 50 MCG tablet Take 1 tablet (50 mcg total) by mouth daily before breakfast. 02/18/15   Velvet Bathe, MD  metoprolol succinate (TOPROL-XL) 50 MG 24 hr tablet Take 50 mg by mouth daily. 09/05/14   [provider]  pantoprazole (PROTONIX) 40 MG tablet Take 1 tablet (40 mg total) by mouth daily. 11/05/15   Ghimire, Henreitta Leber, MD    Family History History reviewed. No pertinent family history.  Social History Social History   Tobacco Use  . Smoking status: Never Smoker  . Smokeless tobacco: Never Used  Substance Use Topics  . Alcohol use: No  . Drug use: No     Allergies   Patient has no known allergies.   Review of Systems Review of Systems  Constitutional: Positive for fatigue and fever (reported 100.3 at home). Negative for chills.  HENT: Negative for facial swelling and sore throat.   Respiratory: Negative for shortness of breath.   Cardiovascular: Negative for chest pain.  Gastrointestinal: Positive for abdominal pain and constipation. Negative for nausea and vomiting.  Genitourinary: Negative for dysuria.  Musculoskeletal: Negative for back pain.  Skin: Negative for rash and wound.  Neurological: Positive for weakness. Negative for headaches.  Psychiatric/Behavioral: The patient is not nervous/anxious.      Physical Exam Updated Vital Signs BP (!) 186/75   Pulse (!) 58   Temp 99.5 F (37.5 C) (Rectal)   Resp 17   SpO2 100%   Physical Exam  Constitutional: She appears well-developed and well-nourished. No distress.  HENT:  Head: Normocephalic and atraumatic.  Mouth/Throat: Oropharynx is clear and moist. No oropharyngeal exudate.  Eyes: Pupils are equal, round, and reactive to  light. Conjunctivae are normal. Right eye exhibits no discharge. Left eye exhibits no discharge. No scleral icterus.  Neck: Normal range of motion. Neck supple. No thyromegaly present.  Cardiovascular: Normal rate, regular rhythm, normal heart sounds and intact distal pulses. Exam reveals no gallop and no friction rub.  No murmur heard. Pulmonary/Chest: Effort normal and breath sounds normal. No stridor. No respiratory distress. She has no wheezes. She has no rales.  Abdominal: Soft. Bowel sounds are normal. She exhibits no distension. There is tenderness. There is no rebound and no guarding.  Patient wincing on palpation of the abdomen, worse on the left side  Musculoskeletal: She exhibits no edema.  Tenderness to the anterior lateral right hip with full range of  motion, no erythema or ecchymosis noted No midline cervical, thoracic, or lumbar tenderness  Lymphadenopathy:    She has no cervical adenopathy.  Neurological: She is alert. Coordination normal.  5/5 strength to all 4 extremities, sensation intact  Skin: Skin is warm and dry. No rash noted. She is not diaphoretic. No pallor.  Psychiatric: She has a normal mood and affect.  Nursing note and vitals reviewed.    ED Treatments / Results  Labs (all labs ordered are listed, but only abnormal results are displayed) Labs Reviewed  COMPREHENSIVE METABOLIC PANEL - Abnormal; Notable for the following components:      Result Value   Sodium 126 (*)    Chloride 96 (*)    Glucose, Bld 109 (*)    BUN 25 (*)    Creatinine, Ser 1.36 (*)    Calcium 8.4 (*)    Total Protein 6.1 (*)    Albumin 2.7 (*)    GFR calc non Af Amer 36 (*)    GFR calc Af Amer 42 (*)    All other components within normal limits  CBC WITH DIFFERENTIAL/PLATELET - Abnormal; Notable for the following components:   WBC 3.3 (*)    RBC 2.68 (*)    Hemoglobin 7.9 (*)    HCT 24.6 (*)    All other components within normal limits  URINALYSIS, ROUTINE W REFLEX MICROSCOPIC  - Abnormal; Notable for the following components:   Protein, ur 100 (*)    All other components within normal limits  TSH - Abnormal; Notable for the following components:   TSH 4.693 (*)    All other components within normal limits  URINE CULTURE  LIPASE, BLOOD  POC OCCULT BLOOD, ED  TYPE AND SCREEN  ABO/RH    EKG EKG Interpretation  Date/Time:  Sunday June 22 2018 09:30:10 EDT Ventricular Rate:  77 PR Interval:    QRS Duration: 77 QT Interval:  383 QTC Calculation: 434 R Axis:   28 Text Interpretation:  Sinus rhythm Baseline wander in lead(s) V6 No significant change since last tracing Confirmed by Orlie Dakin [phone]) on 06/22/2018 10:24:30 AM   Radiology Ct Abdomen Pelvis Wo Contrast  Result Date: 06/22/2018 CLINICAL DATA:  Right hip and diffuse abdominal pain following a fall 2 weeks ago. EXAM: CT ABDOMEN AND PELVIS WITHOUT CONTRAST TECHNIQUE: Multidetector CT imaging of the abdomen and pelvis was performed following the standard protocol without IV contrast. COMPARISON:  10/31/2014 and lumbar spine radiographs dated 10/31/2016. FINDINGS: Lower chest: Mild left lower lobe atelectasis and minimal right lower lobe atelectasis. Mildly enlarged heart. Small hiatal hernia. Hepatobiliary: Tiny calcified granuloma in the liver adjacent to the gallbladder. Normal appearing gallbladder with a Phrygian cap. Pancreas: Unremarkable. No pancreatic ductal dilatation or surrounding inflammatory changes. Spleen: Normal in size without focal abnormality. Adrenals/Urinary Tract: Adrenal glands are unremarkable. Kidneys are normal, without renal calculi, focal lesion, or hydronephrosis. Bladder is unremarkable. Stomach/Bowel: Small hiatal hernia. Unremarkable small bowel and colon. Surgically absent appendix. Vascular/Lymphatic: Atheromatous arterial calcifications without aneurysm. No enlarged lymph nodes. Reproductive: Uterus and bilateral adnexa are unremarkable. Other: Small left inguinal  hernia containing fat. Musculoskeletal: Lumbar and lower thoracic spine degenerative changes and mild levoconvex scoliosis. Interval approximately 20% T12 superior endplate compression deformity with no acute fracture lines and minimal bony retropulsion. Stable previously demonstrated L1 vertebral compression deformity. No hip or pelvic fracture seen. IMPRESSION: 1. No acute abnormality. 2. approximately 20% T12 superior endplate compression deformity with no acute fracture lines and minimal bony retropulsion.  3. Stable L1 vertebral compression deformity. 4. Small hiatal hernia. 5. Small left inguinal hernia containing fat. Electronically Signed   By: Claudie Revering M.D.   On: 06/22/2018 15:43   Dg Chest Portable 1 View  Result Date: 06/22/2018 CLINICAL DATA:  Progressive weakness EXAM: PORTABLE CHEST 1 VIEW COMPARISON:  10/16/2017 FINDINGS: Lungs are essentially clear. No focal consolidation. No pleural effusion or pneumothorax. Focal eventration of the right hemidiaphragm. The heart is normal in size. Thoracic aortic atherosclerosis. IMPRESSION: No evidence of acute cardiopulmonary disease. Electronically Signed   By: Julian Hy M.D.   On: 06/22/2018 10:39    Procedures Procedures (including critical care time)  Medications Ordered in ED Medications  sodium chloride 0.9 % bolus 500 mL (0 mLs Intravenous Stopped 06/22/18 1110)  iopamidol (ISOVUE-300) 61 % injection 30 mL (30 mLs Oral Contrast Given 06/22/18 1307)     Initial Impression / Assessment and Plan / ED Course  I have reviewed the triage vital signs and the nursing notes.  Pertinent labs & imaging results that were available during my care of the patient were reviewed by me and considered in my medical decision making (see chart for details).     Patient presenting with generalized weakness and fatigue.  She had a fall a few weeks ago and has had right hip pain.  CBC shows hemoglobin 7.9, which is a slow progression over the  past few months.  Her fecal occult is negative.  CMP shows sodium 126, chloride 96, and improved creatinine from 1 week ago.  Patient given 500 mL normal saline.  TSH is mildly elevated at 4.693.  UA is negative for infection.  Chest x-ray is negative.  CT of the pelvis shows a 20% T12 compression fracture without acute fracture lines and minimal bony retropulsion; stable L1 vertebral compression deformity, small hiatal hernia and small left inguinal hernia containing fat.  No acute abnormality.  Patient amatory in the ED and states she is feeling better than she did.  Patient will refer to PCP for recheck and possible transfusion.  Patient not meeting criteria for emergent transfusion today.  She does not feel faint.  She denies any chest pain or shortness of breath.  Strict return precautions given.  Patient and her son understand agree with plan.  Patient vitals stable throughout ED course, afebrile, and discharged in satisfactory condition.  Patient also evaluated by Dr. Winfred Leeds who guided the patient's management and agrees with plan.  Final Clinical Impressions(s) / ED Diagnoses   Final diagnoses:  Compression fracture of T12 vertebra, initial encounter [facility])  Weakness    ED Discharge Orders    None       Frederica Kuster, PA-C 06/22/18 [place], MD 06/22/18 1644

## 2018-06-22 NOTE — ED Triage Notes (Signed)
Pt to ER for evaluation of progressive worsening weakness over the last few days. Temp 100.3 per EMS. Pt speaks [place].

## 2018-06-22 NOTE — ED Notes (Signed)
Bladder scanned patient, 155ml noted in bladder.  Spoke to son about possible catheterization, states okay to perform.  Will find second witness for procedure

## 2018-06-22 NOTE — ED Notes (Signed)
Patient able to ambulate independently  

## 2018-06-22 NOTE — ED Notes (Signed)
Pt back from CT

## 2018-06-22 NOTE — ED Notes (Signed)
Reminded pt's son about need for urine sample.  States he would like to give his mother some water.  Will ask EDP

## 2018-06-22 NOTE — ED Notes (Signed)
Family reports patient fell 2-3 weeks ago and has since had right hip pain. Ambulates at home with assistance.

## 2018-06-22 NOTE — ED Provider Notes (Signed)
History is obtained from patient using her son as interpreter.  I offered professional medical interpreter which patient declined. Complains of generalized weakness for the past 1 week.  Her appetite has been good.  Her son reports temperature of 95 degrees yesterday and 100.2 degrees this morning.  MS treated patient with Tylenol prior to arrival.  Was here 6 days ago with similar complaints at that time complaining of headache .  She left before being seen by a physician or APP provider.  On exam alert frail-appearing HEENT exam mucous memories moist lungs clear to auscultation heart regular rate and rhythm abdomen mild diffuse tenderness pelvis stable.  All 4 extremities without contusion abrasion or tenderness neurovascularly intact   Orlie Dakin, MD 06/22/18 1134

## 2018-06-22 NOTE — Discharge Instructions (Addendum)
Please follow-up with your doctor in 3 to 4 days for recheck.  You will need to have your hemoglobin rechecked in the next couple weeks, as you may require a blood transfusion soon. You can take Tylenol as prescribed over-the-counter, as needed for your hip and back pain. Please return to the emergency department if you develop any new or worsening symptoms.

## 2018-06-23 LAB — URINE CULTURE: Culture: NO GROWTH

## 2018-08-08 ENCOUNTER — Encounter: Payer: Self-pay | Admitting: Hematology

## 2018-08-08 ENCOUNTER — Telehealth: Payer: Self-pay | Admitting: Hematology

## 2018-08-08 NOTE — Telephone Encounter (Signed)
Received a new hem referral from [facility] for iron deficiency anemia. Pt has been scheduled to see Dr. Maylon Peppers on 09/09/18 at 1pm. Letter mailed to the pt and faxed to the referring to notify the pt.

## 2018-08-12 ENCOUNTER — Other Ambulatory Visit (HOSPITAL_COMMUNITY): Payer: Self-pay | Admitting: Internal Medicine

## 2018-08-12 DIAGNOSIS — M545 Low back pain, unspecified: Secondary | ICD-10-CM

## 2018-08-12 DIAGNOSIS — G8929 Other chronic pain: Secondary | ICD-10-CM

## 2018-08-26 ENCOUNTER — Encounter (HOSPITAL_COMMUNITY): Payer: Self-pay

## 2018-08-26 ENCOUNTER — Encounter (HOSPITAL_COMMUNITY)
Admission: RE | Admit: 2018-08-26 | Discharge: 2018-08-26 | Disposition: A | Discharge status: Home / Self Care | Payer: Medicare Other | Source: Ambulatory Visit | Attending: Internal Medicine | Admitting: Internal Medicine

## 2018-08-26 DIAGNOSIS — G8929 Other chronic pain: Secondary | ICD-10-CM | POA: Diagnosis present

## 2018-08-26 DIAGNOSIS — M545 Low back pain: Secondary | ICD-10-CM | POA: Diagnosis not present

## 2018-08-26 MED ORDER — TECHNETIUM TC 99M MEDRONATE IV KIT
20.0000 | PACK | Freq: Once | INTRAVENOUS | Status: AC | PRN
  Administered 2018-08-26: 20 via INTRAVENOUS

## 2018-09-07 ENCOUNTER — Other Ambulatory Visit: Payer: Self-pay | Admitting: Hematology

## 2018-09-07 ENCOUNTER — Encounter: Payer: Self-pay | Admitting: Hematology

## 2018-09-07 DIAGNOSIS — D72819 Decreased white blood cell count, unspecified: Secondary | ICD-10-CM | POA: Insufficient documentation

## 2018-09-07 DIAGNOSIS — D649 Anemia, unspecified: Secondary | ICD-10-CM | POA: Insufficient documentation

## 2018-09-07 DIAGNOSIS — N184 Chronic kidney disease, stage 4 (severe): Secondary | ICD-10-CM

## 2018-09-07 HISTORY — DX: Anemia, unspecified: D64.9

## 2018-09-09 ENCOUNTER — Inpatient Hospital Stay: Payer: Medicare Other

## 2018-09-09 ENCOUNTER — Inpatient Hospital Stay: Payer: Medicare Other | Attending: Hematology | Admitting: Hematology

## 2018-09-23 ENCOUNTER — Other Ambulatory Visit: Payer: Self-pay

## 2018-09-23 ENCOUNTER — Emergency Department (HOSPITAL_COMMUNITY)
Admission: EM | Admit: 2018-09-23 | Discharge: 2018-09-23 | Disposition: A | Discharge status: Home / Self Care | Payer: Medicare Other | Source: Home / Self Care

## 2018-09-23 ENCOUNTER — Inpatient Hospital Stay (HOSPITAL_COMMUNITY)
Admission: EM | Admit: 2018-09-23 | Discharge: 2018-10-17 | DRG: 871 | Disposition: A | Discharge status: Home Health | Payer: Medicare Other | Attending: Family Medicine | Admitting: Family Medicine

## 2018-09-23 ENCOUNTER — Encounter (HOSPITAL_COMMUNITY): Payer: Self-pay

## 2018-09-23 DIAGNOSIS — Z09 Encounter for follow-up examination after completed treatment for conditions other than malignant neoplasm: Secondary | ICD-10-CM

## 2018-09-23 DIAGNOSIS — R1312 Dysphagia, oropharyngeal phase: Secondary | ICD-10-CM | POA: Diagnosis present

## 2018-09-23 DIAGNOSIS — I959 Hypotension, unspecified: Secondary | ICD-10-CM | POA: Diagnosis not present

## 2018-09-23 DIAGNOSIS — K219 Gastro-esophageal reflux disease without esophagitis: Secondary | ICD-10-CM | POA: Diagnosis present

## 2018-09-23 DIAGNOSIS — R4182 Altered mental status, unspecified: Secondary | ICD-10-CM

## 2018-09-23 DIAGNOSIS — D72819 Decreased white blood cell count, unspecified: Secondary | ICD-10-CM | POA: Diagnosis present

## 2018-09-23 DIAGNOSIS — G9340 Encephalopathy, unspecified: Secondary | ICD-10-CM | POA: Diagnosis present

## 2018-09-23 DIAGNOSIS — R531 Weakness: Secondary | ICD-10-CM

## 2018-09-23 DIAGNOSIS — E039 Hypothyroidism, unspecified: Secondary | ICD-10-CM | POA: Diagnosis present

## 2018-09-23 DIAGNOSIS — K567 Ileus, unspecified: Secondary | ICD-10-CM | POA: Diagnosis not present

## 2018-09-23 DIAGNOSIS — R64 Cachexia: Secondary | ICD-10-CM | POA: Diagnosis present

## 2018-09-23 DIAGNOSIS — A419 Sepsis, unspecified organism: Secondary | ICD-10-CM | POA: Diagnosis not present

## 2018-09-23 DIAGNOSIS — R0602 Shortness of breath: Secondary | ICD-10-CM

## 2018-09-23 DIAGNOSIS — I130 Hypertensive heart and chronic kidney disease with heart failure and stage 1 through stage 4 chronic kidney disease, or unspecified chronic kidney disease: Secondary | ICD-10-CM | POA: Diagnosis present

## 2018-09-23 DIAGNOSIS — Z7189 Other specified counseling: Secondary | ICD-10-CM

## 2018-09-23 DIAGNOSIS — R627 Adult failure to thrive: Secondary | ICD-10-CM

## 2018-09-23 DIAGNOSIS — R1310 Dysphagia, unspecified: Secondary | ICD-10-CM

## 2018-09-23 DIAGNOSIS — R29810 Facial weakness: Secondary | ICD-10-CM | POA: Diagnosis not present

## 2018-09-23 DIAGNOSIS — I5033 Acute on chronic diastolic (congestive) heart failure: Secondary | ICD-10-CM | POA: Diagnosis present

## 2018-09-23 DIAGNOSIS — N170 Acute kidney failure with tubular necrosis: Secondary | ICD-10-CM | POA: Diagnosis not present

## 2018-09-23 DIAGNOSIS — R402142 Coma scale, eyes open, spontaneous, at arrival to emergency department: Secondary | ICD-10-CM | POA: Diagnosis present

## 2018-09-23 DIAGNOSIS — D631 Anemia in chronic kidney disease: Secondary | ICD-10-CM | POA: Diagnosis present

## 2018-09-23 DIAGNOSIS — R4702 Dysphasia: Secondary | ICD-10-CM | POA: Diagnosis present

## 2018-09-23 DIAGNOSIS — R509 Fever, unspecified: Secondary | ICD-10-CM

## 2018-09-23 DIAGNOSIS — N183 Chronic kidney disease, stage 3 (moderate): Secondary | ICD-10-CM | POA: Diagnosis present

## 2018-09-23 DIAGNOSIS — N184 Chronic kidney disease, stage 4 (severe): Secondary | ICD-10-CM | POA: Diagnosis present

## 2018-09-23 DIAGNOSIS — E875 Hyperkalemia: Secondary | ICD-10-CM | POA: Diagnosis not present

## 2018-09-23 DIAGNOSIS — J45909 Unspecified asthma, uncomplicated: Secondary | ICD-10-CM | POA: Diagnosis present

## 2018-09-23 DIAGNOSIS — Z6823 Body mass index (BMI) 23.0-23.9, adult: Secondary | ICD-10-CM

## 2018-09-23 DIAGNOSIS — N179 Acute kidney failure, unspecified: Secondary | ICD-10-CM | POA: Diagnosis present

## 2018-09-23 DIAGNOSIS — Z789 Other specified health status: Secondary | ICD-10-CM

## 2018-09-23 DIAGNOSIS — E871 Hypo-osmolality and hyponatremia: Secondary | ICD-10-CM | POA: Diagnosis not present

## 2018-09-23 DIAGNOSIS — Z7982 Long term (current) use of aspirin: Secondary | ICD-10-CM

## 2018-09-23 DIAGNOSIS — H409 Unspecified glaucoma: Secondary | ICD-10-CM | POA: Diagnosis present

## 2018-09-23 DIAGNOSIS — R402252 Coma scale, best verbal response, oriented, at arrival to emergency department: Secondary | ICD-10-CM | POA: Diagnosis present

## 2018-09-23 DIAGNOSIS — J156 Pneumonia due to other aerobic Gram-negative bacteria: Secondary | ICD-10-CM | POA: Diagnosis present

## 2018-09-23 DIAGNOSIS — I509 Heart failure, unspecified: Secondary | ICD-10-CM

## 2018-09-23 DIAGNOSIS — D61818 Other pancytopenia: Secondary | ICD-10-CM | POA: Diagnosis present

## 2018-09-23 DIAGNOSIS — I272 Pulmonary hypertension, unspecified: Secondary | ICD-10-CM | POA: Diagnosis present

## 2018-09-23 DIAGNOSIS — I5031 Acute diastolic (congestive) heart failure: Secondary | ICD-10-CM | POA: Diagnosis present

## 2018-09-23 DIAGNOSIS — D649 Anemia, unspecified: Secondary | ICD-10-CM | POA: Diagnosis present

## 2018-09-23 DIAGNOSIS — I2510 Atherosclerotic heart disease of native coronary artery without angina pectoris: Secondary | ICD-10-CM | POA: Diagnosis present

## 2018-09-23 DIAGNOSIS — J9600 Acute respiratory failure, unspecified whether with hypoxia or hypercapnia: Secondary | ICD-10-CM | POA: Diagnosis present

## 2018-09-23 DIAGNOSIS — Z4659 Encounter for fitting and adjustment of other gastrointestinal appliance and device: Secondary | ICD-10-CM

## 2018-09-23 DIAGNOSIS — R5081 Fever presenting with conditions classified elsewhere: Secondary | ICD-10-CM | POA: Diagnosis present

## 2018-09-23 DIAGNOSIS — G92 Toxic encephalopathy: Secondary | ICD-10-CM | POA: Diagnosis not present

## 2018-09-23 DIAGNOSIS — Z5321 Procedure and treatment not carried out due to patient leaving prior to being seen by health care provider: Secondary | ICD-10-CM

## 2018-09-23 DIAGNOSIS — R402362 Coma scale, best motor response, obeys commands, at arrival to emergency department: Secondary | ICD-10-CM | POA: Diagnosis present

## 2018-09-23 DIAGNOSIS — Z79890 Hormone replacement therapy (postmenopausal): Secondary | ICD-10-CM

## 2018-09-23 DIAGNOSIS — E869 Volume depletion, unspecified: Secondary | ICD-10-CM | POA: Diagnosis not present

## 2018-09-23 DIAGNOSIS — E870 Hyperosmolality and hypernatremia: Secondary | ICD-10-CM

## 2018-09-23 DIAGNOSIS — Z515 Encounter for palliative care: Secondary | ICD-10-CM

## 2018-09-23 DIAGNOSIS — I161 Hypertensive emergency: Secondary | ICD-10-CM | POA: Diagnosis present

## 2018-09-23 LAB — BASIC METABOLIC PANEL
Anion gap: 9 (ref 5–15)
BUN: 27 mg/dL — ABNORMAL HIGH (ref 8–23)
CO2: 26 mmol/L (ref 22–32)
Calcium: 8.6 mg/dL — ABNORMAL LOW (ref 8.9–10.3)
Chloride: 88 mmol/L — ABNORMAL LOW (ref 98–111)
Creatinine, Ser: 1.21 mg/dL — ABNORMAL HIGH (ref 0.44–1.00)
GFR calc non Af Amer: 43 mL/min — ABNORMAL LOW (ref >60)
GFR, EST AFRICAN AMERICAN: 50 mL/min — AB (ref >60)
Glucose, Bld: 93 mg/dL (ref 70–99)
Potassium: 4.6 mmol/L (ref 3.5–5.1)
Sodium: 123 mmol/L — ABNORMAL LOW (ref 135–145)

## 2018-09-23 LAB — CBC
HCT: 27 % — ABNORMAL LOW (ref 36.0–46.0)
HEMOGLOBIN: 9.2 g/dL — AB (ref 12.0–15.0)
MCH: 30.2 pg (ref 26.0–34.0)
MCHC: 34.1 g/dL (ref 30.0–36.0)
MCV: 88.5 fL (ref 80.0–100.0)
Platelets: 214 10*3/uL (ref 150–400)
RBC: 3.05 MIL/uL — ABNORMAL LOW (ref 3.87–5.11)
RDW: 15.9 % — ABNORMAL HIGH (ref 11.5–15.5)
WBC: 2.8 10*3/uL — ABNORMAL LOW (ref 4.0–10.5)
nRBC: 0 % (ref 0.0–0.2)

## 2018-09-23 LAB — CBG MONITORING, ED: Glucose-Capillary: 94 mg/dL (ref 70–99)

## 2018-09-23 MED ORDER — SODIUM CHLORIDE 0.9% FLUSH: 3.0000 mL | Freq: Once | INTRAVENOUS | Status: DC

## 2018-09-23 MED ORDER — SODIUM CHLORIDE 0.9 % IV SOLN
INTRAVENOUS | Status: DC
  Administered 2018-09-23: 22:00:00 via INTRAVENOUS

## 2018-09-23 NOTE — ED Triage Notes (Signed)
Patient complains of weakness with decreased appetite. Family states that she appears very fatigued. Alert and answering questions to son. Taking meds as prescribed

## 2018-09-23 NOTE — ED Provider Notes (Addendum)
[facility] DEPT Provider Note   CSN: [id number] Arrival date & time: 09/23/18  1758     History   Chief Complaint Chief Complaint  Patient presents with  . Weakness    HPI Katie Mosley is a 78 y.o. female.  78 year old female presents with worsening weakness x3 days.  No reported fever or emesis.  Does have a history for secondary adrenal insufficiency.  She also has a history of hypothyroidism.  Weakness has not been associated with falls.  There has been no altered mental status.  Patient does not use a walker or cane.  No cough or congestion.  No recent changes to her medications.  Symptoms have been persistent and nothing makes them better worse no treatment used for this prior to arrival.     Past Medical History:  Diagnosis Date  . Adrenal insufficiency ([place])   . Adrenal insufficiency ([place])    Dx'd [facility] 05/2015  . Asthma   . CHF (congestive heart failure) ([place])   . GERD (gastroesophageal reflux disease)   . Glaucoma   . Headache   . HTN (hypertension)   . Hypertension   . Hypothyroidism   . Normocytic anemia 09/07/2018  . Shortness of breath dyspnea   . Thyroid disease    not know in detail    Patient Active Problem List   Diagnosis Date Noted  . Normocytic anemia 09/07/2018  . Leukopenia 09/07/2018  . CKD (chronic kidney disease), stage IV ([place]) 09/07/2018  . Acute exacerbation of CHF (congestive heart failure) ([place]) 10/28/2015  . Constipation   . Acute on chronic diastolic heart failure ([place])   . Acute encephalopathy   . Altered mental status 08/08/2015  . 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 AKI (acute kidney injury) ([place]) 08/08/2015  . Hypothermia 02/15/2015  . Pulmonary hypertension ([place]) 02/14/2015  . Elevated TSH 02/14/2015  . Hyperkalemia 02/14/2015  . Acute diastolic heart failure ([place]) 02/13/2015  . SOB (shortness of breath) 02/13/2015  . Pleuritic chest pain 02/13/2015  . GERD  (gastroesophageal reflux disease)   . Asthma   . Congestive heart disease ([place])   . Asthma, mild intermittent   . Shortness of breath   . Ischemic colitis ([place]) 10/31/2014  . Hyponatremia 10/31/2014  . Benign essential HTN 10/31/2014  . Hypothyroidism 10/31/2014    Past Surgical History:  Procedure Laterality Date  . APPENDECTOMY    . BACK SURGERY    . CAPSULOTOMY  08/23/2011   Procedure: MINOR CAPSULOTOMY;  Surgeon: Myrtha Mantis.;  Location: [facility];  Service: Ophthalmology;  Laterality: Left;  YAG Capsulotomy Left Eye  . ESOPHAGOGASTRODUODENOSCOPY N/A 03/17/2013   Procedure: ESOPHAGOGASTRODUODENOSCOPY (EGD);  Surgeon: Irene Shipper, MD;  Location: [facility] ENDOSCOPY;  Service: Endoscopy;  Laterality: N/A;  . LEFT CATARACT EXTRACTION     2007  . RIGHT CATARACT EXTRACTION     2006     OB History    Gravida  0   Para  0   Term  0   Preterm  0   AB  0   Living        SAB  0   TAB  0   Ectopic  0   Multiple      Live Births               Home Medications    Prior to Admission medications   Medication Sig Start Date End Date Taking? Authorizing Provider  acetaminophen (TYLENOL) 325  MG tablet Take 325 mg by mouth every 6 (six) hours as needed for moderate pain.     [provider]  aspirin EC 81 MG tablet Take 81 mg by mouth daily.    [provider]  docusate sodium (COLACE) 100 MG capsule Take 1 capsule (100 mg total) by mouth every 12 (twelve) hours. Patient taking differently: Take 100 mg by mouth daily as needed for mild constipation.  10/31/16   Isla Pence, MD  HYDROcodone-acetaminophen (NORCO/VICODIN) 5-325 MG tablet Take 1 tablet by mouth every 4 (four) hours as needed. Patient taking differently: Take 1 tablet by mouth every 4 (four) hours as needed for moderate pain.  10/31/16   Isla Pence, MD  levothyroxine (SYNTHROID, LEVOTHROID) 50 MCG tablet Take 1 tablet (50 mcg total) by mouth daily before breakfast. 02/18/15   Velvet Bathe, MD  metoprolol succinate (TOPROL-XL) 50 MG 24 hr tablet Take 50 mg by mouth daily. 09/05/14   [provider]  pantoprazole (PROTONIX) 40 MG tablet Take 1 tablet (40 mg total) by mouth daily. 11/05/15   Ghimire, Henreitta Leber, MD    Family History History reviewed. No pertinent family history.  Social History Social History   Tobacco Use  . Smoking status: Never Smoker  . Smokeless tobacco: Never Used  Substance Use Topics  . Alcohol use: No  . Drug use: No     Allergies   Patient has no known allergies.   Review of Systems Review of Systems  All other systems reviewed and are negative.    Physical Exam Updated Vital Signs BP (!) 184/69 (BP Location: Left Arm)   Pulse (!) 56   Resp 11   Ht 1.499 m (4\' 11" )   Wt 53.5 kg   SpO2 97%   BMI 23.83 kg/m   Physical Exam Vitals signs and nursing note reviewed.  Constitutional:      General: She is not in acute distress.    Appearance: Normal appearance. She is well-developed. She is not toxic-appearing.  HENT:     Head: Normocephalic and atraumatic.  Eyes:     General: Lids are normal.     Conjunctiva/sclera: Conjunctivae normal.     Pupils: Pupils are equal, round, and reactive to light.  Neck:     Musculoskeletal: Normal range of motion and neck supple.     Thyroid: No thyroid mass.     Trachea: No tracheal deviation.  Cardiovascular:     Rate and Rhythm: Normal rate and regular rhythm.     Heart sounds: Normal heart sounds. No murmur. No gallop.   Pulmonary:     Effort: Pulmonary effort is normal. No respiratory distress.     Breath sounds: Normal breath sounds. No stridor. No decreased breath sounds, wheezing, rhonchi or rales.  Abdominal:     General: Bowel sounds are normal. There is no distension.     Palpations: Abdomen is soft.     Tenderness: There is no abdominal tenderness. There is no rebound.  Musculoskeletal: Normal range of motion.        General: No tenderness.  Skin:     General: Skin is warm and dry.     Findings: No abrasion or rash.  Neurological:     Mental Status: She is alert and oriented to person, place, and time.     GCS: GCS eye subscore is 4. GCS verbal subscore is 5. GCS motor subscore is 6.     Cranial Nerves: No cranial nerve deficit or  facial asymmetry.     Sensory: No sensory deficit.     Comments: Strength is 4 out of 5 in all 4 extremities.  Psychiatric:        Attention and Perception: She is inattentive.        Mood and Affect: Affect is flat.      ED Treatments / Results  Labs (all labs ordered are listed, but only abnormal results are displayed) Labs Reviewed  BASIC METABOLIC PANEL - Abnormal; Notable for the following components:      Result Value   Sodium 123 (*)    Chloride 88 (*)    BUN 27 (*)    Creatinine, Ser 1.21 (*)    Calcium 8.6 (*)    GFR calc non Af Amer 43 (*)    GFR calc Af Amer 50 (*)    All other components within normal limits  CBC - Abnormal; Notable for the following components:   WBC 2.8 (*)    RBC 3.05 (*)    Hemoglobin 9.2 (*)    HCT 27.0 (*)    RDW 15.9 (*)    All other components within normal limits  URINALYSIS, ROUTINE W REFLEX MICROSCOPIC  CBG MONITORING, ED    EKG EKG Interpretation  Date/Time:  Tuesday September 23 2018 21:27:56 EST Ventricular Rate:  54 PR Interval:    QRS Duration: 97 QT Interval:  487 QTC Calculation: 462 R Axis:   24 Text Interpretation:  Sinus rhythm Probable left ventricular hypertrophy No significant change since last tracing Confirmed by Lacretia Leigh ([id number]) on 09/23/2018 9:42:20 PM   Radiology No results found.  Procedures Procedures (including critical care time)  Medications Ordered in ED Medications  sodium chloride flush (NS) 0.9 % injection 3 mL (3 mLs Intravenous Not Given 09/23/18 2121)  0.9 %  sodium chloride infusion (has no administration in time range)     Initial Impression / Assessment and Plan / ED Course  I have reviewed the  triage vital signs and the nursing notes.  Pertinent labs & imaging results that were available during my care of the patient were reviewed by me and considered in my medical decision making (see chart for details).     Patient with evidence of hyponatremia with sodium of 123.  Suspect that this is the cause of her weakness.  Will admit to the hospitalist service  Final Clinical Impressions(s) / ED Diagnoses   Final diagnoses:  None    ED Discharge Orders    None       Lacretia Leigh, MD 09/23/18 2141    Lacretia Leigh, MD 09/23/18 2142

## 2018-09-23 NOTE — ED Notes (Signed)
Doesn't want to wait, going to other hospital

## 2018-09-23 NOTE — ED Triage Notes (Signed)
patient c/o weakness and not eating x 2 days. Patient does not speak Katie Mosley- speaks [place].

## 2018-09-24 ENCOUNTER — Observation Stay (HOSPITAL_COMMUNITY): Payer: Medicare Other

## 2018-09-24 ENCOUNTER — Other Ambulatory Visit: Payer: Self-pay

## 2018-09-24 ENCOUNTER — Encounter (HOSPITAL_COMMUNITY): Payer: Self-pay | Admitting: Internal Medicine

## 2018-09-24 DIAGNOSIS — G9340 Encephalopathy, unspecified: Secondary | ICD-10-CM | POA: Diagnosis not present

## 2018-09-24 DIAGNOSIS — E871 Hypo-osmolality and hyponatremia: Secondary | ICD-10-CM | POA: Diagnosis present

## 2018-09-24 DIAGNOSIS — R64 Cachexia: Secondary | ICD-10-CM | POA: Diagnosis present

## 2018-09-24 DIAGNOSIS — I272 Pulmonary hypertension, unspecified: Secondary | ICD-10-CM | POA: Diagnosis present

## 2018-09-24 DIAGNOSIS — E039 Hypothyroidism, unspecified: Secondary | ICD-10-CM

## 2018-09-24 DIAGNOSIS — Z79890 Hormone replacement therapy (postmenopausal): Secondary | ICD-10-CM | POA: Diagnosis not present

## 2018-09-24 DIAGNOSIS — J9601 Acute respiratory failure with hypoxia: Secondary | ICD-10-CM | POA: Diagnosis not present

## 2018-09-24 DIAGNOSIS — G9341 Metabolic encephalopathy: Secondary | ICD-10-CM | POA: Diagnosis not present

## 2018-09-24 DIAGNOSIS — R4182 Altered mental status, unspecified: Secondary | ICD-10-CM | POA: Diagnosis not present

## 2018-09-24 DIAGNOSIS — I361 Nonrheumatic tricuspid (valve) insufficiency: Secondary | ICD-10-CM | POA: Diagnosis not present

## 2018-09-24 DIAGNOSIS — A419 Sepsis, unspecified organism: Secondary | ICD-10-CM | POA: Diagnosis present

## 2018-09-24 DIAGNOSIS — Z515 Encounter for palliative care: Secondary | ICD-10-CM | POA: Diagnosis not present

## 2018-09-24 DIAGNOSIS — N170 Acute kidney failure with tubular necrosis: Secondary | ICD-10-CM | POA: Diagnosis not present

## 2018-09-24 DIAGNOSIS — R609 Edema, unspecified: Secondary | ICD-10-CM

## 2018-09-24 DIAGNOSIS — R402142 Coma scale, eyes open, spontaneous, at arrival to emergency department: Secondary | ICD-10-CM | POA: Diagnosis present

## 2018-09-24 DIAGNOSIS — I509 Heart failure, unspecified: Secondary | ICD-10-CM | POA: Diagnosis not present

## 2018-09-24 DIAGNOSIS — N184 Chronic kidney disease, stage 4 (severe): Secondary | ICD-10-CM | POA: Diagnosis not present

## 2018-09-24 DIAGNOSIS — T68XXXA Hypothermia, initial encounter: Secondary | ICD-10-CM | POA: Diagnosis not present

## 2018-09-24 DIAGNOSIS — I5041 Acute combined systolic (congestive) and diastolic (congestive) heart failure: Secondary | ICD-10-CM | POA: Diagnosis not present

## 2018-09-24 DIAGNOSIS — I5033 Acute on chronic diastolic (congestive) heart failure: Secondary | ICD-10-CM | POA: Diagnosis present

## 2018-09-24 DIAGNOSIS — N179 Acute kidney failure, unspecified: Secondary | ICD-10-CM | POA: Diagnosis not present

## 2018-09-24 DIAGNOSIS — I161 Hypertensive emergency: Secondary | ICD-10-CM | POA: Diagnosis present

## 2018-09-24 DIAGNOSIS — I130 Hypertensive heart and chronic kidney disease with heart failure and stage 1 through stage 4 chronic kidney disease, or unspecified chronic kidney disease: Secondary | ICD-10-CM | POA: Diagnosis present

## 2018-09-24 DIAGNOSIS — I5021 Acute systolic (congestive) heart failure: Secondary | ICD-10-CM | POA: Diagnosis not present

## 2018-09-24 DIAGNOSIS — R627 Adult failure to thrive: Secondary | ICD-10-CM | POA: Diagnosis not present

## 2018-09-24 DIAGNOSIS — D61818 Other pancytopenia: Secondary | ICD-10-CM | POA: Diagnosis present

## 2018-09-24 DIAGNOSIS — G92 Toxic encephalopathy: Secondary | ICD-10-CM | POA: Diagnosis not present

## 2018-09-24 DIAGNOSIS — R1310 Dysphagia, unspecified: Secondary | ICD-10-CM | POA: Insufficient documentation

## 2018-09-24 DIAGNOSIS — E875 Hyperkalemia: Secondary | ICD-10-CM | POA: Diagnosis not present

## 2018-09-24 DIAGNOSIS — R401 Stupor: Secondary | ICD-10-CM | POA: Diagnosis not present

## 2018-09-24 DIAGNOSIS — J9600 Acute respiratory failure, unspecified whether with hypoxia or hypercapnia: Secondary | ICD-10-CM | POA: Diagnosis present

## 2018-09-24 DIAGNOSIS — I5031 Acute diastolic (congestive) heart failure: Secondary | ICD-10-CM

## 2018-09-24 DIAGNOSIS — N183 Chronic kidney disease, stage 3 (moderate): Secondary | ICD-10-CM | POA: Diagnosis present

## 2018-09-24 DIAGNOSIS — H409 Unspecified glaucoma: Secondary | ICD-10-CM | POA: Diagnosis present

## 2018-09-24 DIAGNOSIS — Z7189 Other specified counseling: Secondary | ICD-10-CM | POA: Diagnosis not present

## 2018-09-24 DIAGNOSIS — K219 Gastro-esophageal reflux disease without esophagitis: Secondary | ICD-10-CM | POA: Diagnosis present

## 2018-09-24 DIAGNOSIS — E870 Hyperosmolality and hypernatremia: Secondary | ICD-10-CM | POA: Diagnosis not present

## 2018-09-24 DIAGNOSIS — R402252 Coma scale, best verbal response, oriented, at arrival to emergency department: Secondary | ICD-10-CM | POA: Diagnosis present

## 2018-09-24 DIAGNOSIS — J45909 Unspecified asthma, uncomplicated: Secondary | ICD-10-CM | POA: Diagnosis present

## 2018-09-24 DIAGNOSIS — R402362 Coma scale, best motor response, obeys commands, at arrival to emergency department: Secondary | ICD-10-CM | POA: Diagnosis present

## 2018-09-24 DIAGNOSIS — J156 Pneumonia due to other aerobic Gram-negative bacteria: Secondary | ICD-10-CM | POA: Diagnosis present

## 2018-09-24 DIAGNOSIS — Z7982 Long term (current) use of aspirin: Secondary | ICD-10-CM | POA: Diagnosis not present

## 2018-09-24 DIAGNOSIS — E038 Other specified hypothyroidism: Secondary | ICD-10-CM | POA: Diagnosis not present

## 2018-09-24 DIAGNOSIS — K567 Ileus, unspecified: Secondary | ICD-10-CM | POA: Diagnosis not present

## 2018-09-24 LAB — URINALYSIS, ROUTINE W REFLEX MICROSCOPIC
Bilirubin Urine: NEGATIVE
Glucose, UA: NEGATIVE mg/dL
Hgb urine dipstick: NEGATIVE
Ketones, ur: NEGATIVE mg/dL
Leukocytes, UA: NEGATIVE
Nitrite: NEGATIVE
Protein, ur: >=300 mg/dL — AB
Specific Gravity, Urine: 1.008 (ref 1.005–1.030)
pH: 6 (ref 5.0–8.0)

## 2018-09-24 LAB — CBC
HEMATOCRIT: 23.7 % — AB (ref 36.0–46.0)
Hemoglobin: 8.1 g/dL — ABNORMAL LOW (ref 12.0–15.0)
MCH: 31.5 pg (ref 26.0–34.0)
MCHC: 34.2 g/dL (ref 30.0–36.0)
MCV: 92.2 fL (ref 80.0–100.0)
Platelets: 141 10*3/uL — ABNORMAL LOW (ref 150–400)
RBC: 2.57 MIL/uL — ABNORMAL LOW (ref 3.87–5.11)
RDW: 16.1 % — ABNORMAL HIGH (ref 11.5–15.5)
WBC: 1.6 10*3/uL — ABNORMAL LOW (ref 4.0–10.5)
nRBC: 0 % (ref 0.0–0.2)

## 2018-09-24 LAB — HEPATIC FUNCTION PANEL
ALT: 30 U/L (ref 0–44)
AST: 52 U/L — ABNORMAL HIGH (ref 15–41)
Albumin: 3 g/dL — ABNORMAL LOW (ref 3.5–5.0)
Alkaline Phosphatase: 80 U/L (ref 38–126)
Bilirubin, Direct: 0.3 mg/dL — ABNORMAL HIGH (ref 0.0–0.2)
Indirect Bilirubin: 0.4 mg/dL (ref 0.3–0.9)
Total Bilirubin: 0.7 mg/dL (ref 0.3–1.2)
Total Protein: 7.1 g/dL (ref 6.5–8.1)

## 2018-09-24 LAB — BASIC METABOLIC PANEL
ANION GAP: 7 (ref 5–15)
BUN: 23 mg/dL (ref 8–23)
CO2: 24 mmol/L (ref 22–32)
Calcium: 8 mg/dL — ABNORMAL LOW (ref 8.9–10.3)
Chloride: 96 mmol/L — ABNORMAL LOW (ref 98–111)
Creatinine, Ser: 1 mg/dL (ref 0.44–1.00)
GFR calc Af Amer: >60 mL/min (ref >60)
GFR, EST NON AFRICAN AMERICAN: 54 mL/min — AB (ref >60)
Glucose, Bld: 84 mg/dL (ref 70–99)
Potassium: 4.2 mmol/L (ref 3.5–5.1)
Sodium: 127 mmol/L — ABNORMAL LOW (ref 135–145)

## 2018-09-24 LAB — SODIUM, URINE, RANDOM: Sodium, Ur: 75 mmol/L

## 2018-09-24 LAB — RETICULOCYTES
IMMATURE RETIC FRACT: 11.3 % (ref 2.3–15.9)
RBC.: 2.57 MIL/uL — ABNORMAL LOW (ref 3.87–5.11)
Retic Count, Absolute: 47.5 10*3/uL (ref 19.0–186.0)
Retic Ct Pct: 1.9 % (ref 0.4–3.1)

## 2018-09-24 LAB — IRON AND TIBC
Iron: 144 ug/dL (ref 28–170)
Saturation Ratios: 40 % — ABNORMAL HIGH (ref 10.4–31.8)
TIBC: 358 ug/dL (ref 250–450)
UIBC: 214 ug/dL

## 2018-09-24 LAB — BRAIN NATRIURETIC PEPTIDE: B Natriuretic Peptide: 1030.5 pg/mL — ABNORMAL HIGH (ref 0.0–100.0)

## 2018-09-24 LAB — CORTISOL: CORTISOL PLASMA: 14.4 ug/dL

## 2018-09-24 LAB — FOLATE: FOLATE: 38.4 ng/mL (ref >5.9)

## 2018-09-24 LAB — TSH: TSH: 9.855 u[IU]/mL — ABNORMAL HIGH (ref 0.350–4.500)

## 2018-09-24 LAB — CBG MONITORING, ED: Glucose-Capillary: 73 mg/dL (ref 70–99)

## 2018-09-24 LAB — VITAMIN B12: Vitamin B-12: 1046 pg/mL — ABNORMAL HIGH (ref 180–914)

## 2018-09-24 LAB — OSMOLALITY, URINE: Osmolality, Ur: 323 mOsm/kg (ref 300–900)

## 2018-09-24 LAB — TROPONIN I: Troponin I: 0.06 ng/mL (ref <0.03)

## 2018-09-24 LAB — MAGNESIUM: Magnesium: 1.7 mg/dL (ref 1.7–2.4)

## 2018-09-24 LAB — FERRITIN: Ferritin: 139 ng/mL (ref 11–307)

## 2018-09-24 MED ORDER — AMLODIPINE BESYLATE 5 MG PO TABS: 5.0000 mg | ORAL_TABLET | Freq: Two times a day (BID) | ORAL | Status: DC

## 2018-09-24 MED ORDER — ONDANSETRON HCL 4 MG/2ML IJ SOLN: 4.0000 mg | Freq: Four times a day (QID) | INTRAMUSCULAR | Status: DC | PRN

## 2018-09-24 MED ORDER — SODIUM CHLORIDE 0.9 % IV SOLN
1.0000 g | Freq: Two times a day (BID) | INTRAVENOUS | Status: DC
  Filled 2018-09-24: qty 1

## 2018-09-24 MED ORDER — ASPIRIN EC 81 MG PO TBEC
81.0000 mg | DELAYED_RELEASE_TABLET | Freq: Every day | ORAL | Status: DC
  Filled 2018-09-24: qty 1

## 2018-09-24 MED ORDER — DOCUSATE SODIUM 100 MG PO CAPS
100.0000 mg | ORAL_CAPSULE | Freq: Two times a day (BID) | ORAL | Status: DC
  Filled 2018-09-24: qty 1

## 2018-09-24 MED ORDER — METHYLPREDNISOLONE SODIUM SUCC 40 MG IJ SOLR
40.0000 mg | Freq: Two times a day (BID) | INTRAMUSCULAR | Status: DC
  Administered 2018-09-24: 40 mg via INTRAVENOUS
  Filled 2018-09-24: qty 1

## 2018-09-24 MED ORDER — ONDANSETRON HCL 4 MG PO TABS: 4.0000 mg | ORAL_TABLET | Freq: Four times a day (QID) | ORAL | Status: DC | PRN

## 2018-09-24 MED ORDER — FUROSEMIDE 10 MG/ML IJ SOLN
40.0000 mg | Freq: Two times a day (BID) | INTRAMUSCULAR | Status: DC
  Administered 2018-09-24: 40 mg via INTRAVENOUS
  Filled 2018-09-24: qty 4

## 2018-09-24 MED ORDER — HYDROCORTISONE NA SUCCINATE PF 100 MG IJ SOLR
50.0000 mg | Freq: Three times a day (TID) | INTRAMUSCULAR | Status: DC
  Administered 2018-09-25: 50 mg via INTRAVENOUS
  Filled 2018-09-24: qty 2

## 2018-09-24 MED ORDER — SODIUM CHLORIDE 0.9 % IV SOLN
2.0000 g | INTRAVENOUS | Status: AC
  Administered 2018-09-24: 2 g via INTRAVENOUS
  Filled 2018-09-24: qty 2

## 2018-09-24 MED ORDER — ACETAMINOPHEN 650 MG RE SUPP
650.0000 mg | Freq: Four times a day (QID) | RECTAL | Status: DC | PRN
  Administered 2018-09-26 (×2): 650 mg via RECTAL
  Filled 2018-09-24 (×3): qty 1

## 2018-09-24 MED ORDER — ENOXAPARIN SODIUM 40 MG/0.4ML ~~LOC~~ SOLN
40.0000 mg | SUBCUTANEOUS | Status: DC
  Administered 2018-09-24: 40 mg via SUBCUTANEOUS
  Filled 2018-09-24: qty 0.4

## 2018-09-24 MED ORDER — METHYLPREDNISOLONE SODIUM SUCC 40 MG IJ SOLR: 40.0000 mg | Freq: Two times a day (BID) | INTRAMUSCULAR | Status: DC

## 2018-09-24 MED ORDER — METOPROLOL SUCCINATE ER 50 MG PO TB24
50.0000 mg | ORAL_TABLET | Freq: Every day | ORAL | Status: DC
  Administered 2018-09-24: 50 mg via ORAL
  Filled 2018-09-24: qty 1

## 2018-09-24 MED ORDER — ACETAMINOPHEN 325 MG PO TABS
650.0000 mg | ORAL_TABLET | Freq: Four times a day (QID) | ORAL | Status: DC | PRN
  Administered 2018-09-27 – 2018-10-09 (×2): 650 mg via ORAL
  Filled 2018-09-24 (×2): qty 2

## 2018-09-24 MED ORDER — HYDROCODONE-ACETAMINOPHEN 5-325 MG PO TABS: 1.0000 | ORAL_TABLET | ORAL | Status: DC | PRN

## 2018-09-24 MED ORDER — CLONIDINE HCL 0.2 MG/24HR TD PTWK
0.2000 mg | MEDICATED_PATCH | TRANSDERMAL | Status: DC
  Administered 2018-09-24: 0.2 mg via TRANSDERMAL
  Filled 2018-09-24: qty 1

## 2018-09-24 MED ORDER — LEVOTHYROXINE SODIUM 100 MCG/5ML IV SOLN
37.5000 ug | Freq: Every day | INTRAVENOUS | Status: DC
  Administered 2018-09-24 – 2018-10-08 (×15): 37.5 ug via INTRAVENOUS
  Filled 2018-09-24 (×16): qty 5

## 2018-09-24 MED ORDER — SODIUM CHLORIDE 0.9 % IV SOLN
2.0000 g | Freq: Two times a day (BID) | INTRAVENOUS | Status: DC
  Filled 2018-09-24 (×2): qty 2

## 2018-09-24 MED ORDER — LEVOTHYROXINE SODIUM 50 MCG PO TABS
50.0000 ug | ORAL_TABLET | Freq: Every day | ORAL | Status: DC
  Administered 2018-09-24: 50 ug via ORAL
  Filled 2018-09-24: qty 1

## 2018-09-24 MED ORDER — PANTOPRAZOLE SODIUM 40 MG PO TBEC
40.0000 mg | DELAYED_RELEASE_TABLET | Freq: Every day | ORAL | Status: DC
  Filled 2018-09-24: qty 1

## 2018-09-24 MED ORDER — DEXTROSE 5 % IV SOLN
Freq: Once | INTRAVENOUS | Status: AC
  Administered 2018-09-24: 14:00:00 via INTRAVENOUS

## 2018-09-24 MED ORDER — HYDRALAZINE HCL 20 MG/ML IJ SOLN
10.0000 mg | Freq: Four times a day (QID) | INTRAMUSCULAR | Status: DC | PRN
  Administered 2018-09-24 – 2018-09-28 (×3): 10 mg via INTRAVENOUS
  Filled 2018-09-24 (×3): qty 1

## 2018-09-24 NOTE — Progress Notes (Signed)
Pharmacy Antibiotic Note  Katie Mosley is a 78 y.o. female admitted on 09/23/2018 with febrile neutropenia (hypothermia, pancytopenia).  Pharmacy has been consulted for cefepime dosing.  Plan:  Cefepime 2g IV q12h  Follow up renal fxn, culture results, and clinical course.   Height: 4\' 11"  (149.9 cm) Weight: 118 lb (53.5 kg) IBW/kg (Calculated) : 43.2  Temp (24hrs), Avg:93.7 F (34.3 C), Min:93.7 F (34.3 C), Max:93.7 F (34.3 C)  Recent Labs  Lab 09/23/18 2031 09/24/18 0605  WBC 2.8* 1.6*  CREATININE 1.21* 1.00    Estimated Creatinine Clearance: 34.6 mL/min (by C-G formula based on SCr of 1 mg/dL).    No Known Allergies  Antimicrobials this admission: 1/29 Cefepime >>   Dose adjustments this admission:   Microbiology results: 1/29 BCx:    Thank you for allowing pharmacy to be a part of this patient's care.  Gretta Arab PharmD, BCPS Pager [phone] 09/24/2018 4:32 PM

## 2018-09-24 NOTE — ED Notes (Signed)
RN Spoke with Hospitalist, Patient is NPO due to difficulty and uncertainty of patient being able to swallow pills.

## 2018-09-24 NOTE — ED Notes (Signed)
Nurse Secretary has paged Hospitalist. 

## 2018-09-24 NOTE — ED Notes (Signed)
[place] MD made aware of pts blood sugar and inquired about starting D5 as maintenance fluid. Awaiting response.

## 2018-09-24 NOTE — Consult Note (Signed)
Consultation Note Date: 09/24/2018   Patient Name: Katie Mosley  DOB: 1941/03/31  MRN: [id number]  Age / Sex: 78 y.o., female  PCP: Nolene Ebbs, MD Referring Physician: Roney Jaffe, MD  Reason for Consultation: Establishing goals of care and Psychosocial/spiritual support  HPI/Patient Profile: 78 y.o. female who speaks somalian with past medical history of hypothyroidism, leukopenia, grade 1 diastolic heart failure, and CKD stg 3 who was admitted on 09/23/2018 with lethargy and an inability to swallow.  CT head is negative for acute stroke.  TSH 9.855.  Sodium 123.  She is pancytopenic.  Her troponin is minimally elevated.  Her son provides the history as the patient is no speaking.  He reports she became acutely lethargic and stopped eating.  At baseline she walks and is independent with ADLs - although she walks very slowly with small steps.   Her son reports that she is normally very cold. She tends to be constipated.  He denies skin changes or hair loss.  He carefully provides a low salt diet for her and monitors her fluid volume.  He gives lasix PRN when her legs are swollen or she is short of breath.  However, he has not noticed these symptoms recently.  Clinical Assessment and Goals of Care:  I have reviewed medical records including EPIC notes, labs and imaging, received report from Dr. Jonnie Finner, assessed the patient and then met at the bedside along with two of her sons to discuss diagnosis prognosis, [place], EOL wishes, disposition and options.  I introduced Palliative Medicine as specialized medical care for people living with serious illness. It focuses on providing relief from the symptoms and stress of a serious illness. The goal is to improve quality of life for both the patient and the family.  We discussed a brief life review of the patient.  She lives with 1 son and his wife.  She has two  sons and 1 daughter.  She has had 2 episodes in the past similar to this one where she became very lethargic and stopped eating for several days.      We noted one hospitalization with similar symptoms at [facility] in 05/2015.  Primary Decision Maker:  NEXT OF KIN Son    SUMMARY OF RECOMMENDATIONS    PMT will follow with you over the next couple of days and assist in refining [place] as patient's status evolves. Patient was hospitalized in 05/2015 at [facility] with very similar symptoms.  She required intubation.  Work up was unrevealing.  Code Status/Advance Care Planning:  DNR - with full scope treatment.  Uncertain about the cause of pancytopenia.   Symptom Management:   Will trial low dose IV steroids for questionable history of adrenal insufficiency.  Started IV levothyroxine given that she is unable to swallow pills well.    Check Free T4 and BNP.  Carefully monitor volume status  Will check swallow eval.  Additional Recommendations (Limitations, Scope, Preferences):  Full Scope Treatment  Palliative Prophylaxis:   Aspiration  Psycho-social/Spiritual:   Desire  for further Chaplaincy support: not at this time  Prognosis:   Unable to determine.  Discharge Planning: To Be Determined      Primary Diagnoses: Present on Admission: . Hyponatremia . Pulmonary hypertension (HCC) . Hypothyroidism . CKD (chronic kidney disease), stage IV (HCC) . Acute diastolic heart failure (HCC)   I have reviewed the medical record, interviewed the patient and family, and examined the patient. The following aspects are pertinent.  Past Medical History:  Diagnosis Date  . Adrenal insufficiency (HCC)   . Adrenal insufficiency (HCC)    Dx'd [facility] 05/2015  . Asthma   . CHF (congestive heart failure) (HCC)   . GERD (gastroesophageal reflux disease)   . Glaucoma   . Headache   . HTN (hypertension)   . Hypertension   . Hypothyroidism   . Normocytic anemia 09/07/2018  . Shortness of  breath dyspnea   . Thyroid disease    not know in detail   Social History   Socioeconomic History  . Marital status: Widowed    Spouse name: Not on file  . Number of children: Not on file  . Years of education: Not on file  . Highest education level: Not on file  Occupational History  . Not on file  Social Needs  . Financial resource strain: Not on file  . Food insecurity:    Worry: Not on file    Inability: Not on file  . Transportation needs:    Medical: Not on file    Non-medical: Not on file  Tobacco Use  . Smoking status: Never Smoker  . Smokeless tobacco: Never Used  Substance and Sexual Activity  . Alcohol use: No  . Drug use: No  . Sexual activity: Never  Lifestyle  . Physical activity:    Days per week: Not on file    Minutes per session: Not on file  . Stress: Not on file  Relationships  . Social connections:    Talks on phone: Not on file    Gets together: Not on file    Attends religious service: Not on file    Active member of club or organization: Not on file    Attends meetings of clubs or organizations: Not on file    Relationship status: Not on file  Other Topics Concern  . Not on file  Social History Narrative   ** Merged History Encounter **       History reviewed. No pertinent family history. Scheduled Meds: . aspirin EC  81 mg Oral Daily  . cloNIDine  0.2 mg Transdermal Weekly  . docusate sodium  100 mg Oral Q12H  . enoxaparin (LOVENOX) injection  40 mg Subcutaneous Q24H  . furosemide  40 mg Intravenous Q12H  . levothyroxine  37.5 mcg Intravenous Daily  . methylPREDNISolone (SOLU-MEDROL) injection  40 mg Intravenous Q12H  . pantoprazole  40 mg Oral Daily   Continuous Infusions: PRN Meds:.acetaminophen **OR** acetaminophen, hydrALAZINE, HYDROcodone-acetaminophen, ondansetron **OR** ondansetron (ZOFRAN) IV No Known Allergies Review of Systems patient not speaking.  Physical Exam  Well developed female, does not open eyes or speak.    She does attempt to follow commands. CV:  Brady, no m/r/g Resp:  CTA, no w/c/r Abdomen soft, central mass (scar tissue?), NT, nD Lower extremities 1+ edema bilaterally.  Vital Signs: BP (!) 204/83   Pulse (!) 52   Resp 14   Ht 4' 11" (1.499 m)   Wt 53.5 kg   SpO2 100%   BMI 23.83 kg/m    Pain Scale: 0-10   Pain Score: 0-No pain   SpO2: SpO2: 100 % O2 Device:SpO2: 100 % O2 Flow Rate: .   IO: Intake/output summary:   Intake/Output Summary (Last 24 hours) at 09/24/2018 1447 Last data filed at 09/24/2018 1324 Gross per 24 hour  Intake 1000 ml  Output 1000 ml  Net 0 ml    LBM:   Baseline Weight: Weight: 53.5 kg Most recent weight: Weight: 53.5 kg     Palliative Assessment/Data: 20%     Time In: 2:00 Time Out: 3:10 Time Total: 70 min Greater than 50%  of this time was spent counseling and coordinating care related to the above assessment and plan.  Signed by: Florentina Jenny, PA-C Palliative Medicine Pager: [phone]  Please contact Palliative Medicine Team phone at [phone] for questions and concerns.  For individual provider: See Shea Evans

## 2018-09-24 NOTE — ED Notes (Signed)
Per MD: Verbal order obtained for D5 at 65cc/hr.

## 2018-09-24 NOTE — ED Notes (Signed)
ED TO INPATIENT HANDOFF REPORT  Name/Age/Gender Katie Mosley 78 y.o. female  Code Status    Code Status Orders  (From admission, onward)         Start     Ordered   09/24/18 1002  Do not attempt resuscitation (DNR)  Continuous    Question Answer Comment  In the event of cardiac or respiratory ARREST Do not call a "code blue"   In the event of cardiac or respiratory ARREST Do not perform Intubation, CPR, defibrillation or ACLS   In the event of cardiac or respiratory ARREST Use medication by any route, position, wound care, and other measures to relive pain and suffering. May use oxygen, suction and manual treatment of airway obstruction as needed for comfort.      09/24/18 1001        Code Status History    Date Active Date Inactive Code Status Order ID Comments User Context   09/24/2018 0423 09/24/2018 1001 Full Code [id number]  Rise Patience, MD ED   10/29/2015 0046 11/05/2015 1331 Full Code [id number]  Eugenie Filler, MD Inpatient   08/08/2015 0432 08/13/2015 1739 Full Code [id number]  Edwin Dada, MD Inpatient   02/13/2015 0552 02/18/2015 1505 Full Code [id number]  Ivor Costa, MD Inpatient   10/31/2014 1332 11/03/2014 1820 Full Code [id number]  Orson Eva, MD Inpatient      Home/SNF/Other Home  Chief Complaint Weakness  Level of Care/Admitting Diagnosis ED Disposition    ED Disposition Condition Comment   [place]: [place] [[id number]]  Level of Care: Telemetry [5]  Admit to tele based on following criteria: Acute CHF  Diagnosis: Acute CHF (congestive heart failure) [facility]) [[id number]]  Admitting Physician: [place], [place]  Attending Physician: Roney Jaffe [[id number]]  Estimated length of stay: past midnight tomorrow  Certification:: I certify this patient will need inpatient services for at least 2 midnights  PT Class (Do Not Modify): Inpatient [101]  PT Acc Code (Do Not Modify): Private Telemetry [10024]        Medical History Past Medical History:  Diagnosis Date  . Adrenal insufficiency ([place])   . Adrenal insufficiency ([place])    Dx'd [facility] 05/2015  . Asthma   . CHF (congestive heart failure) ([place])   . GERD (gastroesophageal reflux disease)   . Glaucoma   . Headache   . HTN (hypertension)   . Hypertension   . Hypothyroidism   . Normocytic anemia 09/07/2018  . Shortness of breath dyspnea   . Thyroid disease    not know in detail    Allergies No Known Allergies  IV Location/Drains/Wounds Patient Lines/Drains/Airways Status   Active Line/Drains/Airways    Name:   Placement date:   Placement time:   Site:   Days:   Peripheral IV 09/23/18 Left Forearm   09/23/18    2141    Forearm   1   Peripheral IV 09/24/18 Right Arm   09/24/18    1936    Arm   less than 1          Labs/Imaging Results for orders placed or performed during the hospital encounter of 09/23/18 (from the past 48 hour(s))  CBG monitoring, ED     Status: None   Collection Time: 09/23/18  7:27 PM  Result Value Ref Range   Glucose-Capillary 94 70 - 99 mg/dL  Basic metabolic panel     Status: Abnormal   Collection Time: 09/23/18  8:31 PM  Result Value Ref Range   Sodium 123 (L) 135 - 145 mmol/L   Potassium 4.6 3.5 - 5.1 mmol/L   Chloride 88 (L) 98 - 111 mmol/L   CO2 26 22 - 32 mmol/L   Glucose, Bld 93 70 - 99 mg/dL   BUN 27 (H) 8 - 23 mg/dL   Creatinine, Ser 1.21 (H) 0.44 - 1.00 mg/dL   Calcium 8.6 (L) 8.9 - 10.3 mg/dL   GFR calc non Af Amer 43 (L) >60 mL/min   GFR calc Af Amer 50 (L) >60 mL/min   Anion gap 9 5 - 15    Comment: Performed at [facility] [address] [place], [place] [address]  CBC     Status: Abnormal   Collection Time: 09/23/18  8:31 PM  Result Value Ref Range   WBC 2.8 (L) 4.0 - 10.5 K/uL   RBC 3.05 (L) 3.87 - 5.11 MIL/uL   Hemoglobin 9.2 (L) 12.0 - 15.0 g/dL   HCT 27.0 (L) 36.0 - 46.0 %   MCV 88.5 80.0 - 100.0 fL   MCH 30.2 26.0 - 34.0 pg   MCHC 34.1 30.0 -  36.0 g/dL   RDW 15.9 (H) 11.5 - 15.5 %   Platelets 214 150 - 400 K/uL   nRBC 0.0 0.0 - 0.2 %    Comment: Performed at [facility], [place] [address], [place], [place] [address]  TSH     Status: Abnormal   Collection Time: 09/24/18  6:05 AM  Result Value Ref Range   TSH 9.855 (H) 0.350 - 4.500 uIU/mL    Comment: Performed by a 3rd Generation assay with a functional sensitivity of <=0.01 uIU/mL. Performed at [facility], [place] [address] [place],  [place] [address]   CBC     Status: Abnormal   Collection Time: 09/24/18  6:05 AM  Result Value Ref Range   WBC 1.6 (L) 4.0 - 10.5 K/uL   RBC 2.57 (L) 3.87 - 5.11 MIL/uL   Hemoglobin 8.1 (L) 12.0 - 15.0 g/dL   HCT 23.7 (L) 36.0 - 46.0 %   MCV 92.2 80.0 - 100.0 fL   MCH 31.5 26.0 - 34.0 pg   MCHC 34.2 30.0 - 36.0 g/dL   RDW 16.1 (H) 11.5 - 15.5 %   Platelets 141 (L) 150 - 400 K/uL   nRBC 0.0 0.0 - 0.2 %    Comment: Performed at [facility], [place] [address] [place], [place] [address]  Basic metabolic panel     Status: Abnormal   Collection Time: 09/24/18  6:05 AM  Result Value Ref Range   Sodium 127 (L) 135 - 145 mmol/L   Potassium 4.2 3.5 - 5.1 mmol/L   Chloride 96 (L) 98 - 111 mmol/L   CO2 24 22 - 32 mmol/L   Glucose, Bld 84 70 - 99 mg/dL   BUN 23 8 - 23 mg/dL   Creatinine, Ser 1.00 0.44 - 1.00 mg/dL   Calcium 8.0 (L) 8.9 - 10.3 mg/dL   GFR calc non Af Amer 54 (L) >60 mL/min   GFR calc Af Amer >60 >60 mL/min   Anion gap 7 5 - 15    Comment: Performed at [facility], [place] [address], [place], [place] [address]  Hepatic function panel     Status: Abnormal   Collection Time: 09/24/18  6:05 AM  Result Value Ref Range   Total Protein 7.1 6.5 - 8.1 g/dL  Albumin 3.0 (L) 3.5 - 5.0 g/dL   AST 52 (H) 15 - 41 U/L   ALT 30 0 - 44 U/L   Alkaline Phosphatase 80 38 - 126 U/L   Total Bilirubin 0.7 0.3 - 1.2 mg/dL   Bilirubin, Direct 0.3 (H) 0.0 - 0.2 mg/dL    Indirect Bilirubin 0.4 0.3 - 0.9 mg/dL    Comment: Performed at [facility], [place] [address], [place], [place] [address]  Magnesium     Status: None   Collection Time: 09/24/18  6:05 AM  Result Value Ref Range   Magnesium 1.7 1.7 - 2.4 mg/dL    Comment: Performed at [facility], [place] [address], [place], [place] [address]  Troponin I - Now Then Q6H     Status: Abnormal   Collection Time: 09/24/18  6:05 AM  Result Value Ref Range   Troponin I 0.06 (HH) <0.03 ng/mL    Comment: CRITICAL RESULT CALLED TO, READ BACK BY AND VERIFIED WITH: SMITH,T. RN AT 0981 09/24/18 MULLINS,T Performed at [facility], [place] [address], [place], [place] [address]   Cortisol     Status: None   Collection Time: 09/24/18  6:05 AM  Result Value Ref Range   Cortisol, Plasma 14.4 ug/dL    Comment: (NOTE) AM    6.7 - 22.6 ug/dL PM   <10.0       ug/dL Performed at [facility] [address] [place], [place] [address]   Vitamin B12     Status: Abnormal   Collection Time: 09/24/18  6:05 AM  Result Value Ref Range   Vitamin B-12 1,046 (H) 180 - 914 pg/mL    Comment: (NOTE) This assay is not validated for testing neonatal or myeloproliferative syndrome specimens for Vitamin B12 levels. Performed at [facility], [place] [address], [place], [place] [address]   Folate     Status: None   Collection Time: 09/24/18  6:05 AM  Result Value Ref Range   Folate 38.4 >5.9 ng/mL    Comment: RESULTS CONFIRMED BY MANUAL DILUTION Performed at [facility] [address] [place], [place] [address]   Iron and TIBC     Status: Abnormal   Collection Time: 09/24/18  6:05 AM  Result Value Ref Range   Iron 144 28 - 170 ug/dL   TIBC 358 250 - 450 ug/dL   Saturation Ratios 40 (H) 10.4 - 31.8 %   UIBC 214 ug/dL    Comment: Performed at [facility], [place] [address] [place], [place] [address]  Ferritin      Status: None   Collection Time: 09/24/18  6:05 AM  Result Value Ref Range   Ferritin 139 11 - 307 ng/mL    Comment: Performed at [facility], [place] [address], [place], [place] [address]  Reticulocytes     Status: Abnormal   Collection Time: 09/24/18  6:05 AM  Result Value Ref Range   Retic Ct Pct 1.9 0.4 - 3.1 %   RBC. 2.57 (L) 3.87 - 5.11 MIL/uL   Retic Count, Absolute 47.5 19.0 - 186.0 K/uL   Immature Retic Fract 11.3 2.3 - 15.9 %    Comment: Performed at [facility], [place] [address], [place], [place] [address]  Urinalysis, Routine w reflex microscopic     Status: Abnormal   Collection Time: 09/24/18  7:46 AM  Result Value Ref Range   Color, Urine YELLOW  YELLOW   APPearance CLEAR CLEAR   Specific Gravity, Urine 1.008 1.005 - 1.030   pH 6.0 5.0 - 8.0   Glucose, UA NEGATIVE NEGATIVE mg/dL   Hgb urine dipstick NEGATIVE NEGATIVE   Bilirubin Urine NEGATIVE NEGATIVE   Ketones, ur NEGATIVE NEGATIVE mg/dL   Protein, ur >=300 (A) NEGATIVE mg/dL   Nitrite NEGATIVE NEGATIVE   Leukocytes, UA NEGATIVE NEGATIVE   RBC / HPF 0-5 0 - 5 RBC/hpf   WBC, UA 0-5 0 - 5 WBC/hpf   Bacteria, UA RARE (A) NONE SEEN    Comment: Performed at [facility], [place] [address], [place], [place] [address]  Sodium, urine, random     Status: None   Collection Time: 09/24/18  7:46 AM  Result Value Ref Range   Sodium, Ur 75 mmol/L    Comment: Performed at [facility], [place] [address], [place], [place] [address]  Osmolality, urine     Status: None   Collection Time: 09/24/18  7:46 AM  Result Value Ref Range   Osmolality, Ur 323 300 - 900 mOsm/kg    Comment: Performed at [facility] [address], [place], [place] [address]  POC CBG, ED     Status: None   Collection Time: 09/24/18  1:16 PM  Result Value Ref Range   Glucose-Capillary 73 70 - 99 mg/dL  Brain natriuretic peptide     Status: Abnormal   Collection Time: 09/24/18   3:13 PM  Result Value Ref Range   B Natriuretic Peptide 1,030.5 (H) 0.0 - 100.0 pg/mL    Comment: Performed at [facility], [place] [address], [place], [place] [address]   Dg Chest 2 View  Result Date: 09/24/2018 CLINICAL DATA:  Shortness of breath. EXAM: CHEST - 2 VIEW COMPARISON:  06/22/2018 and 10/16/2017 FINDINGS: There is new pulmonary vascular congestion. There is new peripheral haziness in the right lung cyst pulmonary edema. No discrete effusions. Heart is within normal limits of size considering the AP portable technique. Aortic atherosclerosis.  No acute bone abnormalities. IMPRESSION: New pulmonary vascular congestion with slight peripheral pulmonary edema on the right. Electronically Signed   By: Lorriane Shire M.D.   On: 09/24/2018 04:12   Ct Head Wo Contrast  Result Date: 09/24/2018 CLINICAL DATA:  78 y/o  F; 3 days of weakness. EXAM: CT HEAD WITHOUT CONTRAST TECHNIQUE: Contiguous axial images were obtained from the base of the skull through the vertex without intravenous contrast. COMPARISON:  10/16/2017 CT head. FINDINGS: Brain: No evidence of acute infarction, hemorrhage, hydrocephalus, extra-axial collection or mass lesion/mass effect. Stable dystrophic calcifications in the basal ganglia and calcifications in the left cerebellar hemisphere. 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 Stable chronic microvascular ischemic changes and volume loss of the brain. Vascular: Calcific atherosclerosis of carotid siphons. No hyperdense vessel identified. Skull: Normal. Negative for fracture or focal lesion. Sinuses/Orbits: No acute finding. Other: Bilateral intra-ocular lens replacement. IMPRESSION: 1. No acute intracranial abnormality identified. 2. Stable chronic microvascular ischemic changes and volume loss of the brain. Stable small chronic lacunar infarct in the right caudate nucleus. Electronically Signed   By: Kristine Garbe M.D.   On:  09/24/2018 05:15   Vas [place] Lower Extremity Venous (dvt)  Result Date: 09/24/2018  Lower Venous Study Indications: Edema.  Performing Technologist: Oliver Hum RVT  Examination Guidelines: A complete evaluation includes B-mode imaging, spectral Doppler, color Doppler, and power Doppler as needed of all accessible portions of each  vessel. Bilateral testing is considered an integral part of a complete examination. Limited examinations for reoccurring indications may be performed as noted.  Right Venous Findings: +---------+---------------+---------+-----------+----------+-------+          CompressibilityPhasicitySpontaneityPropertiesSummary +---------+---------------+---------+-----------+----------+-------+ CFV      Full           Yes      Yes                          +---------+---------------+---------+-----------+----------+-------+ SFJ      Full                                                 +---------+---------------+---------+-----------+----------+-------+ FV Prox  Full                                                 +---------+---------------+---------+-----------+----------+-------+ FV Mid   Full                                                 +---------+---------------+---------+-----------+----------+-------+ FV DistalFull                                                 +---------+---------------+---------+-----------+----------+-------+ PFV      Full                                                 +---------+---------------+---------+-----------+----------+-------+ POP      Full           Yes      Yes                          +---------+---------------+---------+-----------+----------+-------+ PTV      Full                                                 +---------+---------------+---------+-----------+----------+-------+ PERO     Full                                                  +---------+---------------+---------+-----------+----------+-------+  Left Venous Findings: +---------+---------------+---------+-----------+----------+-------+          CompressibilityPhasicitySpontaneityPropertiesSummary +---------+---------------+---------+-----------+----------+-------+ CFV      Full           Yes      Yes                          +---------+---------------+---------+-----------+----------+-------+ SFJ      Full                                                 +---------+---------------+---------+-----------+----------+-------+  FV Prox  Full                                                 +---------+---------------+---------+-----------+----------+-------+ FV Mid   Full                                                 +---------+---------------+---------+-----------+----------+-------+ FV DistalFull                                                 +---------+---------------+---------+-----------+----------+-------+ PFV      Full                                                 +---------+---------------+---------+-----------+----------+-------+ POP      Full           Yes      Yes                          +---------+---------------+---------+-----------+----------+-------+ PTV      Full                                                 +---------+---------------+---------+-----------+----------+-------+ PERO     Full                                                 +---------+---------------+---------+-----------+----------+-------+    Summary: Right: There is no evidence of deep vein thrombosis in the lower extremity. No cystic structure found in the popliteal fossa. Left: There is no evidence of deep vein thrombosis in the lower extremity. No cystic structure found in the popliteal fossa.  *See table(s) above for measurements and observations. Electronically signed by Monica Martinez MD on 09/24/2018 at 3:52:06 PM.    Final      Pending Labs Unresulted Labs (From admission, onward)    Start     Ordered   10/01/18 0500  Creatinine, serum  (enoxaparin (LOVENOX)    CrCl >/= 30 ml/min)  Weekly,   R    Comments:  while on enoxaparin therapy    09/24/18 0423   09/25/18 0500  CBC  Tomorrow morning,   R     09/24/18 1006   09/25/18 7353  Basic metabolic panel  Tomorrow morning,   R     09/24/18 1008   09/24/18 1518  Culture, blood (Routine X 2) w Reflex to ID Panel  BLOOD CULTURE X 2,   R     09/24/18 1517   09/24/18 1513  Save Smear  Once,   R     09/24/18 1512   09/24/18 1313  T4, free  Add-on,   R  09/24/18 1313          Vitals/Pain Today's Vitals   09/24/18 2000 09/24/18 2030 09/24/18 2042 09/24/18 2100  BP: (!) 142/62 (!) 152/59  (!) 170/74  Pulse: 73 75  88  Resp: 20 19  (!) 27  Temp:   99 F (37.2 C)   TempSrc:   Rectal   SpO2: 95% 95%  97%  Weight:      Height:      PainSc:        Isolation Precautions No active isolations  Medications Medications  aspirin EC tablet 81 mg (0 mg Oral Hold 09/24/18 1011)  HYDROcodone-acetaminophen (NORCO/VICODIN) 5-325 MG per tablet 1 tablet (has no administration in time range)  docusate sodium (COLACE) capsule 100 mg (0 mg Oral Hold 09/24/18 1011)  pantoprazole (PROTONIX) EC tablet 40 mg (0 mg Oral Hold 09/24/18 1012)  acetaminophen (TYLENOL) tablet 650 mg (has no administration in time range)    Or  acetaminophen (TYLENOL) suppository 650 mg (has no administration in time range)  ondansetron (ZOFRAN) tablet 4 mg (has no administration in time range)    Or  ondansetron (ZOFRAN) injection 4 mg (has no administration in time range)  enoxaparin (LOVENOX) injection 40 mg (40 mg Subcutaneous Given 09/24/18 1047)  cloNIDine (CATAPRES - Dosed in mg/24 hr) patch 0.2 mg (0.2 mg Transdermal Patch Applied 09/24/18 1047)  hydrALAZINE (APRESOLINE) injection 10-20 mg (10 mg Intravenous Given 09/24/18 1443)  levothyroxine (SYNTHROID, LEVOTHROID) injection 37.5 mcg  (37.5 mcg Intravenous Given 09/24/18 2001)  hydrocortisone sodium succinate (SOLU-CORTEF) 100 MG injection 50 mg (has no administration in time range)  ceFEPIme (MAXIPIME) 2 g in sodium chloride 0.9 % 100 mL IVPB (has no administration in time range)  dextrose 5 % solution ( Intravenous New Bag/Given 09/24/18 1356)  ceFEPIme (MAXIPIME) 2 g in sodium chloride 0.9 % 100 mL IVPB (0 g Intravenous Stopped 09/24/18 2000)    Mobility non-ambulatory

## 2018-09-24 NOTE — ED Notes (Signed)
Vascular Ultrasound at bedside.  

## 2018-09-24 NOTE — ED Notes (Signed)
ED TO INPATIENT HANDOFF REPORT  Name/Age/Gender Katie Mosley 78 y.o. female  Code Status    Code Status Orders  (From admission, onward)         Start     Ordered   09/24/18 1002  Do not attempt resuscitation (DNR)  Continuous    Question Answer Comment  In the event of cardiac or respiratory ARREST Do not call a "code blue"   In the event of cardiac or respiratory ARREST Do not perform Intubation, CPR, defibrillation or ACLS   In the event of cardiac or respiratory ARREST Use medication by any route, position, wound care, and other measures to relive pain and suffering. May use oxygen, suction and manual treatment of airway obstruction as needed for comfort.      09/24/18 1001        Code Status History    Date Active Date Inactive Code Status Order ID Comments User Context   09/24/2018 0423 09/24/2018 1001 Full Code [id number]  Rise Patience, MD ED   10/29/2015 0046 11/05/2015 1331 Full Code [id number]  Eugenie Filler, MD Inpatient   08/08/2015 0432 08/13/2015 1739 Full Code [id number]  Edwin Dada, MD Inpatient   02/13/2015 0552 02/18/2015 1505 Full Code [id number]  Ivor Costa, MD Inpatient   10/31/2014 1332 11/03/2014 1820 Full Code [id number]  Orson Eva, MD Inpatient      Home/SNF/Other Home  Chief Complaint Weakness  Level of Care/Admitting Diagnosis ED Disposition    ED Disposition Condition Comment   [place]: [place] [[id number]]  Level of Care: Telemetry [5]  Admit to tele based on following criteria: Acute CHF  Diagnosis: Acute CHF (congestive heart failure) [facility]) [[id number]]  Admitting Physician: [place], [place]  Attending Physician: Roney Jaffe [[id number]]  Estimated length of stay: past midnight tomorrow  Certification:: I certify this patient will need inpatient services for at least 2 midnights  PT Class (Do Not Modify): Inpatient [101]  PT Acc Code (Do Not Modify): Private Telemetry [10024]        Medical History Past Medical History:  Diagnosis Date  . Adrenal insufficiency ([place])   . Adrenal insufficiency ([place])    Dx'd [facility] 05/2015  . Asthma   . CHF (congestive heart failure) ([place])   . GERD (gastroesophageal reflux disease)   . Glaucoma   . Headache   . HTN (hypertension)   . Hypertension   . Hypothyroidism   . Normocytic anemia 09/07/2018  . Shortness of breath dyspnea   . Thyroid disease    not know in detail    Allergies No Known Allergies  IV Location/Drains/Wounds Patient Lines/Drains/Airways Status   Active Line/Drains/Airways    Name:   Placement date:   Placement time:   Site:   Days:   Peripheral IV 09/23/18 Left Forearm   09/23/18    2141    Forearm   1          Labs/Imaging Results for orders placed or performed during the hospital encounter of 09/23/18 (from the past 48 hour(s))  CBG monitoring, ED     Status: None   Collection Time: 09/23/18  7:27 PM  Result Value Ref Range   Glucose-Capillary 94 70 - 99 mg/dL  Basic metabolic panel     Status: Abnormal   Collection Time: 09/23/18  8:31 PM  Result Value Ref Range   Sodium 123 (L) 135 - 145 mmol/L   Potassium 4.6 3.5 - 5.1 mmol/L  Chloride 88 (L) 98 - 111 mmol/L   CO2 26 22 - 32 mmol/L   Glucose, Bld 93 70 - 99 mg/dL   BUN 27 (H) 8 - 23 mg/dL   Creatinine, Ser 1.21 (H) 0.44 - 1.00 mg/dL   Calcium 8.6 (L) 8.9 - 10.3 mg/dL   GFR calc non Af Amer 43 (L) >60 mL/min   GFR calc Af Amer 50 (L) >60 mL/min   Anion gap 9 5 - 15    Comment: Performed at [facility], [place] [address], [place], [place] [address]  CBC     Status: Abnormal   Collection Time: 09/23/18  8:31 PM  Result Value Ref Range   WBC 2.8 (L) 4.0 - 10.5 K/uL   RBC 3.05 (L) 3.87 - 5.11 MIL/uL   Hemoglobin 9.2 (L) 12.0 - 15.0 g/dL   HCT 27.0 (L) 36.0 - 46.0 %   MCV 88.5 80.0 - 100.0 fL   MCH 30.2 26.0 - 34.0 pg   MCHC 34.1 30.0 - 36.0 g/dL   RDW 15.9 (H) 11.5 - 15.5 %   Platelets 214 150 - 400 K/uL    nRBC 0.0 0.0 - 0.2 %    Comment: Performed at [facility], [place] [address] [place], [place] [address]  TSH     Status: Abnormal   Collection Time: 09/24/18  6:05 AM  Result Value Ref Range   TSH 9.855 (H) 0.350 - 4.500 uIU/mL    Comment: Performed by a 3rd Generation assay with a functional sensitivity of <=0.01 uIU/mL. Performed at [facility], [place] [address] [place], [place] [address]   CBC     Status: Abnormal   Collection Time: 09/24/18  6:05 AM  Result Value Ref Range   WBC 1.6 (L) 4.0 - 10.5 K/uL   RBC 2.57 (L) 3.87 - 5.11 MIL/uL   Hemoglobin 8.1 (L) 12.0 - 15.0 g/dL   HCT 23.7 (L) 36.0 - 46.0 %   MCV 92.2 80.0 - 100.0 fL   MCH 31.5 26.0 - 34.0 pg   MCHC 34.2 30.0 - 36.0 g/dL   RDW 16.1 (H) 11.5 - 15.5 %   Platelets 141 (L) 150 - 400 K/uL   nRBC 0.0 0.0 - 0.2 %    Comment: Performed at [facility], [place] [address] [place], [place] [address]  Basic metabolic panel     Status: Abnormal   Collection Time: 09/24/18  6:05 AM  Result Value Ref Range   Sodium 127 (L) 135 - 145 mmol/L   Potassium 4.2 3.5 - 5.1 mmol/L   Chloride 96 (L) 98 - 111 mmol/L   CO2 24 22 - 32 mmol/L   Glucose, Bld 84 70 - 99 mg/dL   BUN 23 8 - 23 mg/dL   Creatinine, Ser 1.00 0.44 - 1.00 mg/dL   Calcium 8.0 (L) 8.9 - 10.3 mg/dL   GFR calc non Af Amer 54 (L) >60 mL/min   GFR calc Af Amer >60 >60 mL/min   Anion gap 7 5 - 15    Comment: Performed at [facility], [place] [address], [place], [place] [address]  Hepatic function panel     Status: Abnormal   Collection Time: 09/24/18  6:05 AM  Result Value Ref Range   Total Protein 7.1 6.5 - 8.1 g/dL   Albumin 3.0 (L) 3.5 - 5.0 g/dL   AST 52 (H) 15 - 41 U/L   ALT 30 0 -  44 U/L   Alkaline Phosphatase 80 38 - 126 U/L   Total Bilirubin 0.7 0.3 - 1.2 mg/dL   Bilirubin, Direct 0.3 (H) 0.0 - 0.2 mg/dL   Indirect Bilirubin 0.4 0.3 - 0.9 mg/dL    Comment: Performed at [facility], [place] [address], [place], [place] [address]  Magnesium     Status: None   Collection Time: 09/24/18  6:05 AM  Result Value Ref Range   Magnesium 1.7 1.7 - 2.4 mg/dL    Comment: Performed at [facility], [place] [address] [place], [place] [address]  Troponin I - Now Then Q6H     Status: Abnormal   Collection Time: 09/24/18  6:05 AM  Result Value Ref Range   Troponin I 0.06 (HH) <0.03 ng/mL    Comment: CRITICAL RESULT CALLED TO, READ BACK BY AND VERIFIED WITH: SMITH,T. RN AT 6389 09/24/18 MULLINS,T Performed at [facility], [place] [address] [place], [place] [address]   Cortisol     Status: None   Collection Time: 09/24/18  6:05 AM  Result Value Ref Range   Cortisol, Plasma 14.4 ug/dL    Comment: (NOTE) AM    6.7 - 22.6 ug/dL PM   <10.0       ug/dL Performed at [place] [address], [place], [place] [address]   Vitamin B12     Status: Abnormal   Collection Time: 09/24/18  6:05 AM  Result Value Ref Range   Vitamin B-12 1,046 (H) 180 - 914 pg/mL    Comment: (NOTE) This assay is not validated for testing neonatal or myeloproliferative syndrome specimens for Vitamin B12 levels. Performed at [facility], [place] [address] [place], [place] [address]   Folate     Status: None   Collection Time: 09/24/18  6:05 AM  Result Value Ref Range   Folate 38.4 >5.9 ng/mL    Comment: RESULTS CONFIRMED BY MANUAL DILUTION Performed at [place] [address], [place], [place] [address]   Iron and TIBC     Status: Abnormal   Collection Time: 09/24/18  6:05 AM  Result Value Ref Range   Iron 144 28 - 170 ug/dL   TIBC 358 250 - 450 ug/dL   Saturation Ratios 40 (H) 10.4 - 31.8 %   UIBC 214 ug/dL    Comment: Performed at [facility], [place] [address] [place], [place] [address]  Ferritin     Status: None   Collection Time: 09/24/18  6:05 AM  Result Value Ref Range    Ferritin 139 11 - 307 ng/mL    Comment: Performed at [facility], [place] [address] [place], [place] [address]  Reticulocytes     Status: Abnormal   Collection Time: 09/24/18  6:05 AM  Result Value Ref Range   Retic Ct Pct 1.9 0.4 - 3.1 %   RBC. 2.57 (L) 3.87 - 5.11 MIL/uL   Retic Count, Absolute 47.5 19.0 - 186.0 K/uL   Immature Retic Fract 11.3 2.3 - 15.9 %    Comment: Performed at [facility], [place] [address], [place],  [address]  Urinalysis, Routine w reflex microscopic     Status: Abnormal   Collection Time: 09/24/18  7:46 AM  Result Value Ref Range   Color, Urine YELLOW YELLOW   APPearance CLEAR CLEAR   Specific Gravity, Urine 1.008 1.005 - 1.030   pH 6.0 5.0 - 8.0  Glucose, UA NEGATIVE NEGATIVE mg/dL   Hgb urine dipstick NEGATIVE NEGATIVE   Bilirubin Urine NEGATIVE NEGATIVE   Ketones, ur NEGATIVE NEGATIVE mg/dL   Protein, ur >=300 (A) NEGATIVE mg/dL   Nitrite NEGATIVE NEGATIVE   Leukocytes, UA NEGATIVE NEGATIVE   RBC / HPF 0-5 0 - 5 RBC/hpf   WBC, UA 0-5 0 - 5 WBC/hpf   Bacteria, UA RARE (A) NONE SEEN    Comment: Performed at [facility], [place] [address] [place], [place] [address]  Sodium, urine, random     Status: None   Collection Time: 09/24/18  7:46 AM  Result Value Ref Range   Sodium, Ur 75 mmol/L    Comment: Performed at [facility], [place] [address] [place], [place] [address]  Osmolality, urine     Status: None   Collection Time: 09/24/18  7:46 AM  Result Value Ref Range   Osmolality, Ur 323 300 - 900 mOsm/kg    Comment: Performed at [place] [address], [place], [place] [address]  POC CBG, ED     Status: None   Collection Time: 09/24/18  1:16 PM  Result Value Ref Range   Glucose-Capillary 73 70 - 99 mg/dL   Dg Chest 2 View  Result Date: 09/24/2018 CLINICAL DATA:  Shortness of breath. EXAM: CHEST - 2 VIEW COMPARISON:  06/22/2018 and 10/16/2017 FINDINGS: There  is new pulmonary vascular congestion. There is new peripheral haziness in the right lung cyst pulmonary edema. No discrete effusions. Heart is within normal limits of size considering the AP portable technique. Aortic atherosclerosis.  No acute bone abnormalities. IMPRESSION: New pulmonary vascular congestion with slight peripheral pulmonary edema on the right. Electronically Signed   By: Lorriane Shire M.D.   On: 09/24/2018 04:12   Ct Head Wo Contrast  Result Date: 09/24/2018 CLINICAL DATA:  78 y/o  F; 3 days of weakness. EXAM: CT HEAD WITHOUT CONTRAST TECHNIQUE: Contiguous axial images were obtained from the base of the skull through the vertex without intravenous contrast. COMPARISON:  10/16/2017 CT head. FINDINGS: Brain: No evidence of acute infarction, hemorrhage, hydrocephalus, extra-axial collection or mass lesion/mass effect. Stable dystrophic calcifications in the basal ganglia and calcifications in the left cerebellar hemisphere. 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 Stable chronic microvascular ischemic changes and volume loss of the brain. Vascular: Calcific atherosclerosis of carotid siphons. No hyperdense vessel identified. Skull: Normal. Negative for fracture or focal lesion. Sinuses/Orbits: No acute finding. Other: Bilateral intra-ocular lens replacement. IMPRESSION: 1. No acute intracranial abnormality identified. 2. Stable chronic microvascular ischemic changes and volume loss of the brain. Stable small chronic lacunar infarct in the right caudate nucleus. Electronically Signed   By: Kristine Garbe M.D.   On: 09/24/2018 05:15   Vas [place] Lower Extremity Venous (dvt)  Result Date: 09/24/2018  Lower Venous Study Indications: Edema.  Performing Technologist: Oliver Hum RVT  Examination Guidelines: A complete evaluation includes B-mode imaging, spectral Doppler, color Doppler, and power Doppler as needed of all accessible portions of each vessel. Bilateral  testing is considered an integral part of a complete examination. Limited examinations for reoccurring indications may be performed as noted.  Right Venous Findings: +---------+---------------+---------+-----------+----------+-------+          CompressibilityPhasicitySpontaneityPropertiesSummary +---------+---------------+---------+-----------+----------+-------+ CFV      Full           Yes      Yes                          +---------+---------------+---------+-----------+----------+-------+  SFJ      Full                                                 +---------+---------------+---------+-----------+----------+-------+ FV Prox  Full                                                 +---------+---------------+---------+-----------+----------+-------+ FV Mid   Full                                                 +---------+---------------+---------+-----------+----------+-------+ FV DistalFull                                                 +---------+---------------+---------+-----------+----------+-------+ PFV      Full                                                 +---------+---------------+---------+-----------+----------+-------+ POP      Full           Yes      Yes                          +---------+---------------+---------+-----------+----------+-------+ PTV      Full                                                 +---------+---------------+---------+-----------+----------+-------+ PERO     Full                                                 +---------+---------------+---------+-----------+----------+-------+  Left Venous Findings: +---------+---------------+---------+-----------+----------+-------+          CompressibilityPhasicitySpontaneityPropertiesSummary +---------+---------------+---------+-----------+----------+-------+ CFV      Full           Yes      Yes                           +---------+---------------+---------+-----------+----------+-------+ SFJ      Full                                                 +---------+---------------+---------+-----------+----------+-------+ FV Prox  Full                                                 +---------+---------------+---------+-----------+----------+-------+  FV Mid   Full                                                 +---------+---------------+---------+-----------+----------+-------+ FV DistalFull                                                 +---------+---------------+---------+-----------+----------+-------+ PFV      Full                                                 +---------+---------------+---------+-----------+----------+-------+ POP      Full           Yes      Yes                          +---------+---------------+---------+-----------+----------+-------+ PTV      Full                                                 +---------+---------------+---------+-----------+----------+-------+ PERO     Full                                                 +---------+---------------+---------+-----------+----------+-------+    Summary: Right: There is no evidence of deep vein thrombosis in the lower extremity. No cystic structure found in the popliteal fossa. Left: There is no evidence of deep vein thrombosis in the lower extremity. No cystic structure found in the popliteal fossa.  *See table(s) above for measurements and observations. Electronically signed by Monica Martinez MD on 09/24/2018 at 3:52:06 PM.    Final    EKG Interpretation  Date/Time:  Tuesday September 23 2018 21:27:56 EST Ventricular Rate:  54 PR Interval:    QRS Duration: 97 QT Interval:  487 QTC Calculation: 413 R Axis:   24 Text Interpretation:  Sinus rhythm Probable left ventricular hypertrophy No significant change since last tracing Confirmed by Lacretia Leigh ([id number]) on 09/23/2018 9:42:20  PM   Pending Labs Unresulted Labs (From admission, onward)    Start     Ordered   10/01/18 0500  Creatinine, serum  (enoxaparin (LOVENOX)    CrCl >/= 30 ml/min)  Weekly,   R    Comments:  while on enoxaparin therapy    09/24/18 0423   09/25/18 0500  CBC  Tomorrow morning,   R     09/24/18 1006   09/25/18 2440  Basic metabolic panel  Tomorrow morning,   R     09/24/18 1008   09/24/18 1518  Culture, blood (Routine X 2) w Reflex to ID Panel  BLOOD CULTURE X 2,   R     09/24/18 1517   09/24/18 1513  Save Smear  Once,   R     09/24/18 1512   09/24/18 1513  Brain natriuretic  peptide  Once,   R     09/24/18 1512   09/24/18 1313  T4, free  Add-on,   R     09/24/18 1313          Vitals/Pain Today's Vitals   09/24/18 1500 09/24/18 1530 09/24/18 1545 09/24/18 1600  BP: (!) 154/70 140/60  (!) 127/52  Pulse: 61 (!) 59 (!) 58 (!) 58  Resp: (!) 21 20 16  (!) 22  Temp: (S) (!) 93.7 F (34.3 C)     TempSrc: (S) Rectal     SpO2: 100% 99% 99% 98%  Weight:      Height:      PainSc:        Isolation Precautions No active isolations  Medications Medications  aspirin EC tablet 81 mg (0 mg Oral Hold 09/24/18 1011)  HYDROcodone-acetaminophen (NORCO/VICODIN) 5-325 MG per tablet 1 tablet (has no administration in time range)  docusate sodium (COLACE) capsule 100 mg (0 mg Oral Hold 09/24/18 1011)  pantoprazole (PROTONIX) EC tablet 40 mg (0 mg Oral Hold 09/24/18 1012)  acetaminophen (TYLENOL) tablet 650 mg (has no administration in time range)    Or  acetaminophen (TYLENOL) suppository 650 mg (has no administration in time range)  ondansetron (ZOFRAN) tablet 4 mg (has no administration in time range)    Or  ondansetron (ZOFRAN) injection 4 mg (has no administration in time range)  enoxaparin (LOVENOX) injection 40 mg (40 mg Subcutaneous Given 09/24/18 1047)  cloNIDine (CATAPRES - Dosed in mg/24 hr) patch 0.2 mg (0.2 mg Transdermal Patch Applied 09/24/18 1047)  hydrALAZINE (APRESOLINE)  injection 10-20 mg (10 mg Intravenous Given 09/24/18 1443)  levothyroxine (SYNTHROID, LEVOTHROID) injection 37.5 mcg (has no administration in time range)  hydrocortisone sodium succinate (SOLU-CORTEF) 100 MG injection 50 mg (has no administration in time range)  dextrose 5 % solution ( Intravenous New Bag/Given 09/24/18 1356)    Mobility manual wheelchair

## 2018-09-24 NOTE — ED Notes (Signed)
Date and time results received: 09/24/18 0740 (use smartphrase ".now" to insert current time)  Test: Troponin Critical Value: 0.06  Name of Provider Notified: Ali Lowe, RN  Orders Received? Or Actions Taken?:

## 2018-09-24 NOTE — Progress Notes (Signed)
No charge (H&P done after MN).  Patient very lethargic, can speak minimally w/ help of her son interpreting.  Per son cannot swallow lately, not sure why.  Voiding good after IV lasix.  Pt looks very ill w/ weak resp effort and marked lethargy.  Discussed EOL w/ son who states no aggressive measures in case of arrest, no life support, and agrees w/ DNR order.  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  Poor prognosis.  May need palliative care input. Not a candidate for invasive procedures/ aggressive Rx's , etc.    Kelly Splinter MD [facility] pgr [phone]   09/24/2018, 9:57 AM

## 2018-09-24 NOTE — ED Notes (Signed)
Speech Therapy at bedside

## 2018-09-24 NOTE — H&P (Signed)
History and Physical    Katie Mosley YQM:[id number] DOB: 1941-01-11 DOA: 09/23/2018  PCP: Nolene Ebbs, MD  Patient coming from: Home.  Patient's granddaughter provided translation from [place] language.  Chief Complaint: Shortness of breath and weakness.  HPI: Katie Mosley is a 78 y.o. female with history of diastolic CHF last EF measured in March 2017 was 60 to 65% with grade 1 diastolic dysfunction, chronic hyponatremia, chronic kidney disease stage III, chronic anemia, hypothyroidism hypertension presents to the ER with complaints of having fatigue and shortness of breath for the last 1 week.  Patient also noticed increasing swelling in the lower extremities.  Shortness of breath increased on exertion present even at rest.  Denies any chest pain.  Has had some productive cough.  Patient also has been feeling weak and fatigued easily.  Has not had any dizziness fall or loss of consciousness.  ED Course: In the ER patient EKG shows sinus bradycardia with a heart rate around 54 bpm.  Nonspecific ST-T changes.  Labs reveal sodium of 123 which is markedly low from her previous.  On exam patient has bilateral crepitations with lower extremity edema.  Chest x-ray shows peripheral congestion.  Patient's hemoglobin is at baseline.  But does have some leukopenia.  Patient is afebrile.  Initially patient was given fluids in the ER by ER physician but given that patient has been likely CHF I have discontinued the fluids and start patient on Lasix.  Review of Systems: As per HPI, rest all negative.   Past Medical History:  Diagnosis Date  . Adrenal insufficiency ([place])   . Adrenal insufficiency ([place])    Dx'd [facility] 05/2015  . Asthma   . CHF (congestive heart failure) ([place])   . GERD (gastroesophageal reflux disease)   . Glaucoma   . Headache   . HTN (hypertension)   . Hypertension   . Hypothyroidism   . Normocytic anemia 09/07/2018  . Shortness of breath dyspnea   . Thyroid disease      not know in detail    Past Surgical History:  Procedure Laterality Date  . APPENDECTOMY    . BACK SURGERY    . CAPSULOTOMY  08/23/2011   Procedure: MINOR CAPSULOTOMY;  Surgeon: Myrtha Mantis.;  Location: [place];  Service: Ophthalmology;  Laterality: Left;  YAG Capsulotomy Left Eye  . ESOPHAGOGASTRODUODENOSCOPY N/A 03/17/2013   Procedure: ESOPHAGOGASTRODUODENOSCOPY (EGD);  Surgeon: Irene Shipper, MD;  Location[facility] ENDOSCOPY;  Service: Endoscopy;  Laterality: N/A;  . LEFT CATARACT EXTRACTION     2007  . RIGHT CATARACT EXTRACTION     2006     reports that she has never smoked. She has never used smokeless tobacco. She reports that she does not drink alcohol or use drugs.  No Known Allergies  History reviewed. No pertinent family history.  Prior to Admission medications   Medication Sig Start Date End Date Taking? Authorizing Provider  acetaminophen (TYLENOL) 325 MG tablet Take 325 mg by mouth every 6 (six) hours as needed for moderate pain.    Yes [provider]  albuterol (PROVENTIL HFA;VENTOLIN HFA) 108 (90 Base) MCG/ACT inhaler Inhale 2 puffs into the lungs. Every 4 to 6 hours as needed for shortness of breath and wheezing 09/07/18  Yes [provider]  aspirin EC 81 MG tablet Take 81 mg by mouth daily.   Yes [provider]  docusate sodium (COLACE) 100 MG capsule Take 1 capsule (100 mg total) by mouth every 12 (  twelve) hours. 10/31/16  Yes Isla Pence, MD  furosemide (LASIX) 40 MG tablet Take 40 mg by mouth daily. 08/05/18  Yes [provider]  HYDROcodone-acetaminophen (NORCO/VICODIN) 5-325 MG tablet Take 1 tablet by mouth every 4 (four) hours as needed. Patient taking differently: Take 1 tablet by mouth every 4 (four) hours as needed for moderate pain.  10/31/16  Yes Isla Pence, MD  Iron-FA-B Cmp-C-Biot-Probiotic (FUSION PLUS) CAPS Take 1 tablet by mouth daily.   Yes [provider]  levothyroxine (SYNTHROID, LEVOTHROID)  50 MCG tablet Take 1 tablet (50 mcg total) by mouth daily before breakfast. 02/18/15  Yes Velvet Bathe, MD  metoprolol succinate (TOPROL-XL) 50 MG 24 hr tablet Take 50 mg by mouth daily. 09/05/14  Yes [provider]  pantoprazole (PROTONIX) 40 MG tablet Take 1 tablet (40 mg total) by mouth daily. 11/05/15  Yes Jonetta Osgood, MD    Physical Exam: Vitals:   09/24/18 0100 09/24/18 0130 09/24/18 0200 09/24/18 0230  BP: (!) 191/67 (!) 193/77 (!) 176/81 (!) 172/74  Pulse: (!) 57 62 (!) 57 (!) 55  Resp: 13 (!) 21 14 14   SpO2: 99% 98% 99% 99%  Weight:      Height:          Constitutional: Moderately built and nourished. Vitals:   09/24/18 0100 09/24/18 0130 09/24/18 0200 09/24/18 0230  BP: (!) 191/67 (!) 193/77 (!) 176/81 (!) 172/74  Pulse: (!) 57 62 (!) 57 (!) 55  Resp: 13 (!) 21 14 14   SpO2: 99% 98% 99% 99%  Weight:      Height:       Eyes: Anicteric no pallor. ENMT: No discharge from the ears eyes nose or mouth. Neck: No mass felt.  No neck rigidity no JVD appreciated. Respiratory: No rhonchi mild crepitations bilaterally. Cardiovascular: S1-S2 heard. Abdomen: Soft nontender bowel sounds present. Musculoskeletal: Bilateral lower extremity edema present. Skin: No rash. Neurologic: Alert awake oriented to time place and person.  Moves all extremities. Psychiatric: Appears normal.  Normal affect.   Labs on Admission: I have personally reviewed following labs and imaging studies  CBC: Recent Labs  Lab 09/23/18 2031  WBC 2.8*  HGB 9.2*  HCT 27.0*  MCV 88.5  PLT 654   Basic Metabolic Panel: Recent Labs  Lab 09/23/18 2031  NA 123*  K 4.6  CL 88*  CO2 26  GLUCOSE 93  BUN 27*  CREATININE 1.21*  CALCIUM 8.6*   GFR: Estimated Creatinine Clearance: 28.6 mL/min (A) (by C-G formula based on SCr of 1.21 mg/dL (H)). Liver Function Tests: No results for input(s): AST, ALT, ALKPHOS, BILITOT, PROT, ALBUMIN in the last 168 hours. No results for input(s):  LIPASE, AMYLASE in the last 168 hours. No results for input(s): AMMONIA in the last 168 hours. Coagulation Profile: No results for input(s): INR, PROTIME in the last 168 hours. Cardiac Enzymes: No results for input(s): CKTOTAL, CKMB, CKMBINDEX, TROPONINI in the last 168 hours. BNP (last 3 results) No results for input(s): PROBNP in the last 8760 hours. HbA1C: No results for input(s): HGBA1C in the last 72 hours. CBG: Recent Labs  Lab 09/23/18 1927  GLUCAP 94   Lipid Profile: No results for input(s): CHOL, HDL, LDLCALC, TRIG, CHOLHDL, LDLDIRECT in the last 72 hours. Thyroid Function Tests: No results for input(s): TSH, T4TOTAL, FREET4, T3FREE, THYROIDAB in the last 72 hours. Anemia Panel: No results for input(s): VITAMINB12, FOLATE, FERRITIN, TIBC, IRON, RETICCTPCT in the last 72 hours. Urine analysis:  Component Value Date/Time   COLORURINE YELLOW 06/22/2018 1210   APPEARANCEUR CLEAR 06/22/2018 1210   LABSPEC 1.008 06/22/2018 1210   PHURINE 6.0 06/22/2018 1210   GLUCOSEU NEGATIVE 06/22/2018 1210   HGBUR NEGATIVE 06/22/2018 1210   BILIRUBINUR NEGATIVE 06/22/2018 1210   KETONESUR NEGATIVE 06/22/2018 1210   PROTEINUR 100 (A) 06/22/2018 1210   UROBILINOGEN 1.0 06/08/2015 1724   NITRITE NEGATIVE 06/22/2018 1210   LEUKOCYTESUR NEGATIVE 06/22/2018 1210   Sepsis Labs: @LABRCNTIP (procalcitonin:4,lacticidven:4) )No results found for this or any previous visit (from the past 240 hour(s)).   Radiological Exams on Admission: Dg Chest 2 View  Result Date: 09/24/2018 CLINICAL DATA:  Shortness of breath. EXAM: CHEST - 2 VIEW COMPARISON:  06/22/2018 and 10/16/2017 FINDINGS: There is new pulmonary vascular congestion. There is new peripheral haziness in the right lung cyst pulmonary edema. No discrete effusions. Heart is within normal limits of size considering the AP portable technique. Aortic atherosclerosis.  No acute bone abnormalities. IMPRESSION: New pulmonary vascular congestion  with slight peripheral pulmonary edema on the right. Electronically Signed   By: Lorriane Shire M.D.   On: 09/24/2018 04:12    EKG: Independently reviewed.  Sinus bradycardia with T wave changes.  Assessment/Plan Principal Problem:   Acute diastolic heart failure (HCC) Active Problems:   Hyponatremia   Hypothyroidism   Pulmonary hypertension (HCC)   CKD (chronic kidney disease), stage IV (HCC)   Acute CHF (congestive heart failure) ([place])    1. Acute on chronic diastolic CHF last EF measured was 60 to 65% with grade 1 diastolic dysfunction on March 2017.  On exam patient looks fluid overloaded.  Chest x-ray shows congestion bilateral lower extremity edema.  Patient states she has been compliant with his Lasix.  We will keep patient on Lasix 40 mg IV every 12 and closely follow intake output metabolic panel and daily weights.  Check BNP troponin.  Dopplers of the lower extremity. 2. Fatigue and weakness -we will check TSH cortisol levels.  No other patient EKG does show signs sinus bradycardia.  I have placed parameters to hold metoprolol if heart rate is low.  This can also be contributing to patient's fatigue.  In the past there was concern for adrenal insufficiency but was disproved.  CT head unremarkable. 3. Hyponatremia appears to be chronic.  Check urine studies.  Closely follow metabolic panel.  I think will improve with Lasix and fluid restrictions.  Cortisol and TSH are pending. 4. Chronic kidney disease stage III creatinine appears to be at baseline. 5. Hypertensive emergency likely contributing to patient's symptoms.  On metoprolol and and Lasix.  We will add PRN IV hydralazine and closely follow blood pressure trends.  6. Chronic anemia on iron supplements.  Check anemia panel follow CBC since patient has fatigue. 7. Leukopenia has been low previously but has further declined during this admission.  Closely follow blood counts. 8. Hypothyroidism on Synthroid.  Check TSH.   DVT  prophylaxis: Lovenox. Code Status: Full code. Family Communication: Patient's granddaughter. Disposition Plan: Home. Consults called: None. Admission status: Observation.   Rise Patience MD [facility] Pager [phone].  If 7PM-7AM, please contact night-coverage [email or web address] Password [facility]  09/24/2018, 4:23 AM

## 2018-09-24 NOTE — Progress Notes (Signed)
No charge (H&P done after MN).  Patient very lethargic, can speak minimally w/ help of her son interpreting.  Per son cannot swallow lately, not sure why.  Voiding good after IV lasix.  Pt looks very ill w/ weak resp effort and marked lethargy.  Discussed EOL w/ son who states no aggressive measures in case of arrest, no life support, and agrees w/ DNR order.  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  Poor prognosis.  May need palliative care input. Not a candidate for invasive procedures/ aggressive Rx's , etc.    Kelly Splinter MD [facility] pgr [phone]   09/24/2018, 4:56 PM

## 2018-09-24 NOTE — Progress Notes (Signed)
Clinical/Bedside Swallow Evaluation Patient Details  Name: Katie Mosley MRN: [id number] Date of Birth: March 13, 1941  Today's Date: 09/24/2018 Time: SLP Start Time (ACUTE ONLY): 8588 SLP Stop Time (ACUTE ONLY): 1700 SLP Time Calculation (min) (ACUTE ONLY): 55 min  Past Medical History:  Past Medical History:  Diagnosis Date  . Adrenal insufficiency ([place])   . Adrenal insufficiency ([place])    Dx'd [facility] 05/2015  . Asthma   . CHF (congestive heart failure) ([place])   . GERD (gastroesophageal reflux disease)   . Glaucoma   . Headache   . HTN (hypertension)   . Hypertension   . Hypothyroidism   . Normocytic anemia 09/07/2018  . Shortness of breath dyspnea   . Thyroid disease    not know in detail   Past Surgical History:  Past Surgical History:  Procedure Laterality Date  . APPENDECTOMY    . BACK SURGERY    . CAPSULOTOMY  08/23/2011   Procedure: MINOR CAPSULOTOMY;  Surgeon: Myrtha Mantis.;  Location: [place];  Service: Ophthalmology;  Laterality: Left;  YAG Capsulotomy Left Eye  . ESOPHAGOGASTRODUODENOSCOPY N/A 03/17/2013   Procedure: ESOPHAGOGASTRODUODENOSCOPY (EGD);  Surgeon: Irene Shipper, MD;  Location: [facility] ENDOSCOPY;  Service: Endoscopy;  Laterality: N/A;  . LEFT CATARACT EXTRACTION     2007  . RIGHT CATARACT EXTRACTION     2006   HPI:  pt is a 78 yo female adm to [facility] 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  Pt with h/o CHF, renal dysfunction. Swallow evauluation ordered as pt had difficulty swallowing a pill this am.  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     Assessment / Plan / Recommendation Clinical Impression  Pt presenting with clinical indications concerning for severe dysphagia.  Use of [place] interpreter number [phone] Deatra Canter via [place] and pt's cogntion impaired her historical or current symptom report.  Concern pt may be aspirating  even secretions at this time.   Pt did attempt to follow directions for cranial nerve exam but limited.    Cued dry swallow  resulted in pt overtly coughing and expectorating secretions.  Congested breathing and cough observed.  Tsp of water provided with delayed swallow and appearance of significantly uncomfortable swallow and weak cough- ? UES dysfunction.  Concern for aspiration of tsp of water present and pt is weak and unable to consistently clear secretions/suspected aspirates.    Son reports pt stated chicken and mashed potatoes stuck in her throat earlier in the week causing concern for swallow ability.    Recommend NPO and allow oral moisture via toothette only.  RN reports plan may be comfort care and this SLP recommends an MBS to allow visualization of swallow especially given pt's cognitive status to assure po is comforting for her.  Pt, son and pt's sister educated and agreeable to plan.   Question source of this 3 day hx of severe dysphagia? Note CT head negative for acute findings.  Suspect possible pharyngeal and esophageal deficits.   SLP Visit Diagnosis: Dysphagia, unspecified (R13.10)    Aspiration Risk  Severe aspiration risk;Risk for inadequate nutrition/hydration    Diet Recommendation     Medication Administration: Other (Comment)    Other  Recommendations Oral Care Recommendations: Oral care QID   Follow up Recommendations     tbd   Frequency and Duration      tbd  Prognosis Prognosis for Safe Diet Advancement: Guarded      Swallow Study   General Date of Onset: 09/24/18 HPI: pt is a 78 yo female adm to [facility] with AMS x3 days, CT head + basal ganglia and left cerebellar calcification, 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  Pt with h/o CHF, renal dysfunction. Swallow evauluation ordered as pt had difficulty swallowing a pill this am.  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   Type of Study: Bedside  Swallow Evaluation Diet Prior to this Study: NPO Temperature Spikes Noted: No Respiratory Status: Room air History of Recent Intubation: No Behavior/Cognition: Lethargic/Drowsy(but would awaken and stay awake) Oral Cavity Assessment: Dry;Other (comment)(congested cough with expectoration of secretions) Oral Care Completed by SLP: Other (Comment) Oral Cavity - Dentition: Poor condition Vision: Functional for self-feeding Self-Feeding Abilities: Total assist Patient Positioning: Upright in bed Baseline Vocal Quality: Low vocal intensity Volitional Cough: Weak Volitional Swallow: Able to elicit(inconsistently)    Oral/Motor/Sensory Function Overall Oral Motor/Sensory Function: Generalized oral weakness   Ice Chips Ice chips: Not tested(pt declined to consume)   Thin Liquid Thin Liquid: Impaired Presentation: Spoon Pharyngeal  Phase Impairments: Suspected delayed Swallow;Decreased hyoid-laryngeal movement;Other (comments)(swallow appears effortful and uncomfortable for pt)    Nectar Thick Nectar Thick Liquid: Not tested   Honey Thick Honey Thick Liquid: Not tested   Puree Puree: Impaired Presentation: Spoon Oral Phase Impairments: Other (comment) Oral Phase Functional Implications: Other (comment)(held in anterior oral cavity) Other Comments: pt spit out bolus per SLP verbal cue after she did not orally transit posterior   Solid     Solid: Not tested      Macario Golds 09/24/2018,5:30 PM   Luanna Salk, [place] [facility] SLP [place] Pager [phone] Office [phone]

## 2018-09-24 NOTE — Progress Notes (Signed)
   09/24/18 2300  MEWS Score  Temp (!) 97.4 F (36.3 C)  MEWS Score  MEWS RR 2  MEWS Pulse 0  MEWS Systolic 0  MEWS LOC 1  MEWS Temp 0  MEWS Score 3  MEWS Score Color Yellow  MEWS Assessment  Is this an acute change? No

## 2018-09-24 NOTE — Progress Notes (Signed)
Bilateral lower extremity venous duplex has been completed. Preliminary results can be found in CV Proc through chart review.   09/24/18 9:03 AM Katie Mosley RVT

## 2018-09-24 NOTE — Progress Notes (Signed)
PHARMACY NOTE:  ANTIMICROBIAL RENAL DOSAGE ADJUSTMENT  Current antimicrobial regimen includes a mismatch between antimicrobial dosage and estimated renal function.  As per policy approved by the Pharmacy & Therapeutics and Medical Executive Committees, the antimicrobial dosage will be adjusted accordingly.  Current antimicrobial dosage:  Meropenem 1g IV q8h  Indication: Febrile neutropenia (hypothermia, pancytopenia)  Renal Function:Estimated Creatinine Clearance: 34.6 mL/min (by C-G formula based on SCr of 1 mg/dL).     Antimicrobial dosage has been changed to:  Meropenem 1g IV q12h  Additional comments:  Consider changing from Meropenem to Cefepime for empiric coverage if no hx of ESBL pathogens.   Thank you for allowing pharmacy to be a part of this patient's care.  Gretta Arab PharmD, BCPS Pager [phone] 09/24/2018 4:17 PM

## 2018-09-24 NOTE — Progress Notes (Addendum)
Pt worse w/ hypothermia this afternoon.  May be septic and/ or adrenal insuff. Is leukopenic.  Has "done this before " per the family.  Plan IV steroids w/ HC and blood cx's w/ Abx for febrile neutropenia (hypotherm in this case).  BP's stable, will hold IV lasix for now.     Kelly Splinter MD [facility] pgr [phone] 09/24/2018, 3:27 PM

## 2018-09-24 NOTE — ED Notes (Signed)
Hospitalist has been messaged and has responded with regards to patient rectal temp.

## 2018-09-24 NOTE — ED Notes (Signed)
RN has Clinical cytogeneticist requesting Metoprolol and other meds patient needs.

## 2018-09-24 NOTE — ED Notes (Addendum)
Beir Hugger has been applied to patient.

## 2018-09-24 NOTE — ED Notes (Signed)
Report given to [facility] for [place], Room 1424.

## 2018-09-24 NOTE — ED Notes (Signed)
Purewick placed on pt. 

## 2018-09-24 NOTE — ED Notes (Signed)
BAIR HUGGER REMOVED

## 2018-09-25 ENCOUNTER — Inpatient Hospital Stay (HOSPITAL_COMMUNITY): Payer: Medicare Other

## 2018-09-25 DIAGNOSIS — Z515 Encounter for palliative care: Secondary | ICD-10-CM

## 2018-09-25 DIAGNOSIS — Z7189 Other specified counseling: Secondary | ICD-10-CM

## 2018-09-25 DIAGNOSIS — A419 Sepsis, unspecified organism: Secondary | ICD-10-CM | POA: Diagnosis not present

## 2018-09-25 DIAGNOSIS — R401 Stupor: Secondary | ICD-10-CM

## 2018-09-25 DIAGNOSIS — E038 Other specified hypothyroidism: Secondary | ICD-10-CM

## 2018-09-25 DIAGNOSIS — I361 Nonrheumatic tricuspid (valve) insufficiency: Secondary | ICD-10-CM

## 2018-09-25 LAB — CBC
HCT: 22.3 % — ABNORMAL LOW (ref 36.0–46.0)
Hemoglobin: 7.6 g/dL — ABNORMAL LOW (ref 12.0–15.0)
MCH: 31.1 pg (ref 26.0–34.0)
MCHC: 34.1 g/dL (ref 30.0–36.0)
MCV: 91.4 fL (ref 80.0–100.0)
NRBC: 0 % (ref 0.0–0.2)
Platelets: 170 10*3/uL (ref 150–400)
RBC: 2.44 MIL/uL — ABNORMAL LOW (ref 3.87–5.11)
RDW: 16.6 % — ABNORMAL HIGH (ref 11.5–15.5)
WBC: 4.1 10*3/uL (ref 4.0–10.5)

## 2018-09-25 LAB — BASIC METABOLIC PANEL
ANION GAP: 8 (ref 5–15)
BUN: 32 mg/dL — ABNORMAL HIGH (ref 8–23)
CALCIUM: 7.6 mg/dL — AB (ref 8.9–10.3)
CO2: 22 mmol/L (ref 22–32)
Chloride: 96 mmol/L — ABNORMAL LOW (ref 98–111)
Creatinine, Ser: 1.54 mg/dL — ABNORMAL HIGH (ref 0.44–1.00)
GFR calc Af Amer: 37 mL/min — ABNORMAL LOW (ref >60)
GFR calc non Af Amer: 32 mL/min — ABNORMAL LOW (ref >60)
Glucose, Bld: 114 mg/dL — ABNORMAL HIGH (ref 70–99)
Potassium: 4.6 mmol/L (ref 3.5–5.1)
Sodium: 126 mmol/L — ABNORMAL LOW (ref 135–145)

## 2018-09-25 LAB — ECHOCARDIOGRAM COMPLETE
Height: 59 in
Weight: 1888 oz

## 2018-09-25 LAB — GLUCOSE, CAPILLARY: Glucose-Capillary: 147 mg/dL — ABNORMAL HIGH (ref 70–99)

## 2018-09-25 LAB — T4, FREE: Free T4: 1.39 ng/dL (ref 0.82–1.77)

## 2018-09-25 MED ORDER — SODIUM CHLORIDE 0.9 % IV SOLN
2.0000 g | INTRAVENOUS | Status: DC
  Administered 2018-09-25 – 2018-09-28 (×4): 2 g via INTRAVENOUS
  Filled 2018-09-25 (×4): qty 2

## 2018-09-25 MED ORDER — SODIUM CHLORIDE 0.9 % IV SOLN: INTRAVENOUS | Status: DC

## 2018-09-25 MED ORDER — ENOXAPARIN SODIUM 30 MG/0.3ML ~~LOC~~ SOLN
30.0000 mg | SUBCUTANEOUS | Status: DC
  Administered 2018-09-26 – 2018-09-30 (×5): 30 mg via SUBCUTANEOUS
  Filled 2018-09-25 (×6): qty 0.3

## 2018-09-25 MED ORDER — DEXTROSE-NACL 5-0.9 % IV SOLN
INTRAVENOUS | Status: DC
  Administered 2018-09-25 – 2018-09-27 (×5): via INTRAVENOUS

## 2018-09-25 MED ORDER — SODIUM CHLORIDE 0.9 % IV SOLN
2.0000 g | INTRAVENOUS | Status: DC
  Filled 2018-09-25: qty 2

## 2018-09-25 NOTE — Progress Notes (Addendum)
[facility] Progress Note  Subjective: looks a little better, no new problems this am  Vitals:   09/25/18 0500 09/25/18 1004 09/25/18 1210 09/25/18 1229  BP:  123/66 (!) 90/40 96/67  Pulse:  70 (!) 51 66  Resp:  12 16   Temp: (!) 95.1 F (35.1 C) (!) 97.4 F (36.3 C) (!) 97.3 F (36.3 C)   TempSrc: Rectal Oral Oral   SpO2:  97% 97%   Weight:      Height:        Inpatient medications: . aspirin EC  81 mg Oral Daily  . docusate sodium  100 mg Oral Q12H  . [START ON 09/26/2018] enoxaparin (LOVENOX) injection  30 mg Subcutaneous Q24H  . levothyroxine  37.5 mcg Intravenous Daily  . pantoprazole  40 mg Oral Daily   . ceFEPime (MAXIPIME) IV    . dextrose 5 % and 0.9% NaCl 75 mL/hr at 09/25/18 1016   acetaminophen **OR** acetaminophen, hydrALAZINE, HYDROcodone-acetaminophen, ondansetron **OR** ondansetron (ZOFRAN) IV  Exam: More alert and making more eye contact today Cough and swallow are still very weak No jvd, no nodes Chest clear bilat no wheezing or bronch BS Cor reg no mrg Abd soft ntnd no mass no ascites, nondistended GU for bladder scan this am Ext trace edema LE's, no wounds Neuro is very weak, poor cough/ weak swallow, nonfocal, dosen't say much, family must interpret   Presentation Summary: Katie Mosley is a 78 y.o. female with history of diastolic CHF last EF measured in March 2017 was 60 to 65% with grade 1 diastolic dysfunction, chronic hyponatremia, chronic kidney disease stage III, chronic anemia, hypothyroidism hypertension presents to the ER with complaints of having fatigue and shortness of breath for the last 1 week.  Patient also noticed increasing swelling in the lower extremities.  Shortness of breath increased on exertion present even at rest.  Denies any chest pain.  Has had some productive cough.  Patient also has been feeling weak and fatigued easily.  Has not had any dizziness fall or loss of consciousness. ED Course: In the ER patient EKG  shows sinus bradycardia with a heart rate around 54 bpm.  Nonspecific ST-T changes.  Labs reveal sodium of 123 which is markedly low from her previous.  On exam patient has bilateral crepitations with lower extremity edema.  Chest x-ray shows peripheral congestion.  Patient's hemoglobin is at baseline.  But does have some leukopenia.  Patient is afebrile.  Initially patient was given fluids in the ER by ER physician but given that patient has been likely CHF I have discontinued the fluids and start patient on Lasix.   Home meds:  - aspirin 81/ furosemide 40 qd/ metoprolol xl 50 qd  - levothyroxine 50 ug qd/ pantoprazole 40 qd/ colace 100 bid  - norco/ albuterol prn  - vitamins/ supplements        Hospital Problems/ Course: 1. Hypothermia - and now hypotension, suspected sepsis possible PNA. Stopped IV lasix yest due to hypothermia and concern for sepsis.  Started IV abx w Cefepime. Looks a little better today.  CXR R sided infiltrates could be PNA. UA is negative. Await cultures and cont IV abx, supportive care.  2. Volume - she got IV lasix yest on admit, today creat bumped and not voiding, dry lips. CXR was a poor film, not grossly overloaded today and on room air. Will start IVF"s.   3. Hypotension: serum cortisol was normal> dc IV steroids, this is not adrenal  insufficiency 4. Dysphagia: long-term issues according to the family, being seen by [facility] will await their rec's 5. Hypothyroid: TSH was 9 , she is supposed to be getting 50 ug /d at home 6. Chronic diast CHF: check echo 7. Hyponatremia: TSH up but cortisol wnl.  Will give IVF's today, repeat Na+ in am 8. CKD 3: patient has had creat 1.2- 1.6 since 2016, creat is not much off now 9. Hypertensive urgency: BP's down last night, took off catapres patch today 10. Chronic of chronic disease: iron studies and folate wnl. May need transfusion soon, Hb down to 7.6 today.    11. Leukopenia: this is better today, WBC up 12. Hypothyroidism:  on Synthroid.  TSH up at 9. Follow up as OP.  13. DNR - reconfirmed w/ the son today   DVT prophylaxis: Lovenox. Code Status: Full code. Family Communication: none here today Disposition Plan: Home if/ when better Consults called: None. Admission status: Inpatient/ med surg    Kelly Splinter MD [facility] pgr [phone] 09/25/2018, 12:41 PM   Recent Labs  Lab 09/23/18 2031 09/24/18 0605 09/25/18 0552  NA 123* 127* 126*  K 4.6 4.2 4.6  CL 88* 96* 96*  CO2 26 24 22   GLUCOSE 93 84 114*  BUN 27* 23 32*  CREATININE 1.21* 1.00 1.54*  CALCIUM 8.6* 8.0* 7.6*   Recent Labs  Lab 09/24/18 0605  AST 52*  ALT 30  ALKPHOS 80  BILITOT 0.7  PROT 7.1  ALBUMIN 3.0*   Recent Labs  Lab 09/23/18 2031 09/24/18 0605 09/25/18 0552  WBC 2.8* 1.6* 4.1  HGB 9.2* 8.1* 7.6*  HCT 27.0* 23.7* 22.3*  MCV 88.5 92.2 91.4  PLT 214 141* 170   Iron/TIBC/Ferritin/ %Sat    Component Value Date/Time   IRON 144 09/24/2018 0605   TIBC 358 09/24/2018 0605   FERRITIN 139 09/24/2018 0605   IRONPCTSAT 40 (H) 09/24/2018 8333

## 2018-09-25 NOTE — Progress Notes (Signed)
Pt has a rectal temp of 94.8. warm blankets applied & 3 hot packs under the covers with pt, plastic covering for rectal probe had BRB on it when removed. Pt was positioned on her side to assist with oral secretions since she cant swallow them. Pt is making eye contact & moving arms independently.

## 2018-09-25 NOTE — Progress Notes (Signed)
Pharmacy Antibiotic Note  Katie Mosley is a 78 y.o. female admitted on 09/23/2018 with febrile neutropenia (hypothermia, pancytopenia).  Pharmacy has been consulted for cefepime dosing.  SCr increased, 1.5 with CrCl ~ 22 ml/min  Plan:  Cefepime 2g IV q24h  Follow up renal fxn, culture results, and clinical course.   Height: 4\' 11"  (149.9 cm) Weight: 118 lb (53.5 kg) IBW/kg (Calculated) : 43.2  Temp (24hrs), Avg:96.2 F (35.7 C), Min:93.7 F (34.3 C), Max:99 F (37.2 C)  Recent Labs  Lab 09/23/18 2031 09/24/18 0605 09/25/18 0552  WBC 2.8* 1.6* 4.1  CREATININE 1.21* 1.00 1.54*    Estimated Creatinine Clearance: 22.5 mL/min (A) (by C-G formula based on SCr of 1.54 mg/dL (H)).    No Known Allergies  Antimicrobials this admission: 1/29 Cefepime >>   Dose adjustments this admission:   Microbiology results: 1/29 BCx:    Thank you for allowing pharmacy to be a part of this patient's care.  Gretta Arab PharmD, BCPS Pager [phone] 09/25/2018 12:19 PM

## 2018-09-25 NOTE — Evaluation (Signed)
Physical Therapy Evaluation Patient Details Name: Katie Mosley MRN: [id number] DOB: 15-Mar-1941 Today's Date: 09/25/2018   History of Present Illness  78 yo female admitted to ED on 1/28 for weakness and fatigue, with medical diagnosis of sepsis with possible PNA. PMH includes adrenal insufficiency, hypothyroid, CHF, HTN, hypothyroidism, dyspnea, CKDIII.   Clinical Impression   Pt presents with LE weakness, cognitive impairments different from baseline, difficulty performing all mobility tasks, difficulty managing secretions during session, decreased tolerance for mobility due to fatigue and LE weakness, and shuffling, slow, unsteady gait with use of RW. Pt to benefit from acute PT to address deficits. Pt ambulated 15 ft with RW with mod assist for steadying, directing pt, and safe use of RW. Due to pt presentation, PT recommending SNF placement to address deficits. Pt's son at bedside, and states they will want pt to return home. PT to progress mobility as tolerated, and will continue to follow acutely.      Follow Up Recommendations SNF    Equipment Recommendations  None recommended by PT    Recommendations for Other Services       Precautions / Restrictions Precautions Precautions: Fall Restrictions Weight Bearing Restrictions: No      Mobility  Bed Mobility Overal bed mobility: Needs Assistance Bed Mobility: Supine to Sit;Sit to Supine     Supine to sit: Mod assist;HOB elevated Sit to supine: Mod assist;HOB elevated   General bed mobility comments: Mod assit for supine<>sit for trunk elevation and lowering, LE management, sequencing assist, and stability sitting EOB. Pt with increased time and effort to perform, and once sitting EOB pt with posterior leaning.   Transfers Overall transfer level: Needs assistance Equipment used: Rolling walker (2 wheeled) Transfers: Sit to/from [facility] Sit to Stand: Mod assist;+2 safety/equipment;From elevated  surface Stand pivot transfers: Mod assist       General transfer comment: Mod assist for power up, placement of hands on RW once standing, steadying upon standing. Stand pivot from recliner to bed post-ambulation, requiring mod assist for directing, steadying. Pt with small shuffling steps when walking posteriorly. Pt's son at bedside and assisting with cuing pt, PT using tactile and verbal cuing to facilitate.   Ambulation/Gait Ambulation/Gait assistance: Mod assist;+2 safety/equipment(chair follow ) Gait Distance (Feet): 15 Feet Assistive device: Rolling walker (2 wheeled) Gait Pattern/deviations: Decreased stride length;Step-through pattern;Shuffle;Trunk flexed;Drifts right/left;Leaning posteriorly Gait velocity: decr    General Gait Details: Mod assist for steadying, directing pt. Pt with tendency to flex trunk and take short shuffling steps. Pt required multimodal cues to stay within RW, but not able to maintain safe positioning with RW. Pt with posterior leaning and LE buckling with fatigue after 15 ft ambulation, pt instructed to sit down in recliner but had difficulty sequencing this task.   Stairs            Wheelchair Mobility    Modified Rankin (Stroke Patients Only)       Balance Overall balance assessment: Needs assistance Sitting-balance support: Bilateral upper extremity supported Sitting balance-Leahy Scale: Fair Sitting balance - Comments: able to sit without physical assist for ~20 seconds, posterior leaning with fatigue Postural control: Posterior lean Standing balance support: Bilateral upper extremity supported Standing balance-Leahy Scale: Poor Standing balance comment: relies on RW and PT for support in standing and with ambulation                              Pertinent Vitals/Pain Pain  Assessment: No/denies pain    Home Living Family/patient expects to be discharged to:: Private residence Living Arrangements: Children(son and  daughter-in-law) Available Help at Discharge: Family;Available 24 hours/day(daughter-in-law available 24/7, aide comes in for pt 3 hours a day to assist with ADLs) Type of Home: House Home Access: Stairs to enter Entrance Stairs-Rails: Right;Left;Can reach both Entrance Stairs-Number of Steps: 5 Home Layout: One level Home Equipment: Walker - 2 wheels      Prior Function Level of Independence: Needs assistance   Gait / Transfers Assistance Needed: Per pt's son, pt was independent with mobility and did not need AD or physical assist to perform mobility tasks, no history of falls.  ADL's / Homemaking Assistance Needed: Pt with personal care aide 3 hours/day to assist with bathing and cleaning pt's room, but pt's son also states she was independent with bathing/dressing         Hand Dominance   Dominant Hand: Right    Extremity/Trunk Assessment   Upper Extremity Assessment Upper Extremity Assessment: Generalized weakness;Difficult to assess due to impaired cognition    Lower Extremity Assessment Lower Extremity Assessment: Generalized weakness;Difficult to assess due to impaired cognition(Pt able to perform partial full range AROM hip and knee flexion/extension, as well as DF/PF, in supine positioning)    Cervical / Trunk Assessment Cervical / Trunk Assessment: Kyphotic;Other exceptions Cervical / Trunk Exceptions: forward head position   Communication   Communication: Prefers language other than English(Pt speaks somalian, pt's son at bedside and refused for PT to utilize [place] interpreter)  Cognition Arousal/Alertness: Lethargic;Awake/alert(lethargic upon PT arrival, roused with mobility ) Behavior During Therapy: WFL for tasks assessed/performed Overall Cognitive Status: Impaired/Different from baseline Area of Impairment: Following commands;Safety/judgement;Problem solving                       Following Commands: Follows one step commands  inconsistently Safety/Judgement: Decreased awareness of safety;Decreased awareness of deficits   Problem Solving: Requires verbal cues;Requires tactile cues        General Comments General comments (skin integrity, edema, etc.): pt with a lot of secretions during session, and held her mouth in open O-like position. Pt's son assisting with secretion management with use of suction.     Exercises     Assessment/Plan    PT Assessment Patient needs continued PT services  PT Problem List Decreased strength;Pain;Decreased range of motion;Decreased activity tolerance;Decreased knowledge of use of DME;Decreased balance;Decreased safety awareness;Decreased mobility;Decreased cognition       PT Treatment Interventions DME instruction;Therapeutic activities;Gait training;Patient/family education;Therapeutic exercise;Balance training;Functional mobility training    PT Goals (Current goals can be found in the Care Plan section)  Acute Rehab PT Goals Patient Stated Goal: to go home  PT Goal Formulation: With patient/family Time For Goal Achievement: 10/09/18 Potential to Achieve Goals: Fair    Frequency Min 2X/week   Barriers to discharge        Co-evaluation               AM-PAC PT "6 Clicks" Mobility  Outcome Measure Help needed turning from your back to your side while in a flat bed without using bedrails?: A Lot Help needed moving from lying on your back to sitting on the side of a flat bed without using bedrails?: A Lot Help needed moving to and from a bed to a chair (including a wheelchair)?: A Lot Help needed standing up from a chair using your arms (e.g., wheelchair or bedside chair)?: A Lot Help needed to  walk in hospital room?: A Lot Help needed climbing 3-5 steps with a railing? : Total 6 Click Score: 11    End of Session Equipment Utilized During Treatment: Gait belt Activity Tolerance: Patient limited by fatigue Patient left: in bed;with bed alarm set;with call  bell/phone within reach;with family/visitor present Nurse Communication: Mobility status PT Visit Diagnosis: Other abnormalities of gait and mobility (R26.89);Difficulty in walking, not elsewhere classified (R26.2)    Time: 2244-9753 PT Time Calculation (min) (ACUTE ONLY): 35 min   Charges:   PT Evaluation $PT Eval Low Complexity: 1 Low PT Treatments $Gait Training: 8-22 mins       Julien Girt, PT Acute Rehabilitation Services Pager [phone]  Office [phone]   Aland Chestnutt D Sigurd Pugh 09/25/2018, 4:14 PM

## 2018-09-25 NOTE — Progress Notes (Signed)
Pt has arrived from the ED - nonverbal, eyes open , mouth open, shallow breathing, delayed blink/ startle reflex, congested cough & unable to clear airway. Oral suction set up at bedside, ( instructed granddaughter on how to use it as she is spending the night). Pt closes eyes when asked to close he mouth when granddaughter interprets for this RN. No spontaneous movement of arms, legs, mouth stays open, rt hand fingers extended & not grasping with either hand.

## 2018-09-25 NOTE — Progress Notes (Signed)
  Speech Language Pathology Treatment: Dysphagia  Patient Details Name: Katie Mosley MRN: [id number] DOB: 1940/09/02 Today's Date: 09/25/2018 Time: 0737-1062 SLP Time Calculation (min) (ACUTE ONLY): 14 min  Assessment / Plan / Recommendation Clinical Impression  SLP saw pt this am to determine readiness for MBS/po intake.  Obtained interpreter [place] [phone] used and granddaughter present.    Pt asleep but easily awoken to SLP gentle stimulation.   Orally suctioned pt and provided her with po trials of thin water.    Suspect pt took too much liquid via cup with hand over hand assist thus likely resulting in possible aspiration although throat clear was subtle - pt appeared uncomfortable with this bolus size.   Improved clinical tolerance of thin via tsp noted.  Pt is appropriate for MBS regardless of care plan to find most comfortable diet for this pt.        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  Pt with h/o CHF, renal dysfunction. Swallow evauluation ordered as pt had difficulty swallowing a pill this am.  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        SLP Plan  MBS       Recommendations  Diet recommendations: NPO Compensations: Slow rate;Small sips/bites(intermittent dry swallow)                Follow up Recommendations: (tbd) SLP Visit Diagnosis: Dysphagia, oropharyngeal phase (R13.12) Plan: MBS       GO                Macario Golds 09/25/2018, 10:35 AM   Luanna Salk, MS [facility] SLP Acute Rehab Services Pager [phone] Office [phone]

## 2018-09-25 NOTE — Progress Notes (Signed)
Daily Progress Note   Patient Name: Katie Mosley       Date: 09/25/2018 DOB: August 13, 1941  Age: 78 y.o. MRN#: [id number] Attending Physician: Roney Jaffe, MD Primary Care Physician: Nolene Ebbs, MD Admit Date: 09/23/2018  Reason for Consultation/Follow-up: Establishing goals of care and Psychosocial/spiritual support  Subjective: Patient sitting up working with speech therapy.  Answers "yes" "no"questions with use of the interpreter at bedside.  Patient attempting to follow commands.  Swallows once with difficulty.  Per grand daughter at bedside - in their culture the elderly do not like to swallow their mucus - rather they spit it out.  This is what the patient has been doing at home.    Per RN no urine or stool output overnight.  Temperature dropped again overnight.  Patient with BRB noted on rectal thermometer.    I spoke with grand daughter frankly. We are very concerned about her grand mother.  We were concerned overnight that she may pass away.  We are hopeful for improvement but she is showing signs of being near end of life.  Broached the subject of artificial feeding as an aggressive measure.    Assessment: Patient awake and alert this am but still very weak and with great difficulty swallowing.   Patient Profile/HPI:  78 y.o. female who speaks somalian with past medical history of hypothyroidism, leukopenia, grade 1 diastolic heart failure, and CKD stg 3 who was admitted on 09/23/2018 with lethargy and an inability to swallow.  CT head is negative for acute stroke.  TSH 9.855.  Sodium 123.  She is pancytopenic.  Her troponin is minimally elevated.  Her son provides the history as the patient is not speaking.  He reports she became acutely lethargic and stopped eating.  At  baseline she walks and is independent with ADLs - although she walks very slowly with small steps.     Length of Stay: 1  Current Medications: Scheduled Meds:  . aspirin EC  81 mg Oral Daily  . docusate sodium  100 mg Oral Q12H  . enoxaparin (LOVENOX) injection  40 mg Subcutaneous Q24H  . hydrocortisone sod succinate (SOLU-CORTEF) inj  50 mg Intravenous Q8H  . levothyroxine  37.5 mcg Intravenous Daily  . pantoprazole  40 mg Oral Daily    Continuous Infusions: . ceFEPime (MAXIPIME) IV  PRN Meds: acetaminophen **OR** acetaminophen, hydrALAZINE, HYDROcodone-acetaminophen, ondansetron **OR** ondansetron (ZOFRAN) IV  Physical Exam        Frail elderly female, awake, alert, mouth open, generalized tremor CV +murmur, regular rate Lungs CTA. Abdomen soft, nt, nd   Vital Signs: BP 110/83 (BP Location: Left Arm)   Pulse 68   Temp (!) 95.1 F (35.1 C) (Rectal)   Resp 16   Ht 4\' 11"  (1.499 m)   Wt 53.5 kg   SpO2 96%   BMI 23.83 kg/m  SpO2: SpO2: 96 % O2 Device: O2 Device: Room Air O2 Flow Rate:    Intake/output summary:   Intake/Output Summary (Last 24 hours) at 09/25/2018 0830 Last data filed at 09/24/2018 1324 Gross per 24 hour  Intake -  Output 1000 ml  Net -1000 ml   LBM:   Baseline Weight: Weight: 53.5 kg Most recent weight: Weight: 53.5 kg       Palliative Assessment/Data:30%      Patient Active Problem List   Diagnosis Date Noted  . Acute CHF (congestive heart failure) ([place]) 09/24/2018  . Dysphagia   . Normocytic anemia 09/07/2018  . Leukopenia 09/07/2018  . CKD (chronic kidney disease), stage IV ([place]) 09/07/2018  . Acute exacerbation of CHF (congestive heart failure) ([place]) 10/28/2015  . Constipation   . Acute on chronic diastolic heart failure ([place])   . Acute encephalopathy   . Altered mental status 08/08/2015  . Secondary adrenal insufficiency ([place]) 08/08/2015  . AKI (acute kidney injury) ([place]) 08/08/2015  . Hypothermia 02/15/2015  .  Pulmonary hypertension ([place]) 02/14/2015  . Elevated TSH 02/14/2015  . Hyperkalemia 02/14/2015  . Acute diastolic heart failure ([place]) 02/13/2015  . SOB (shortness of breath) 02/13/2015  . Pleuritic chest pain 02/13/2015  . GERD (gastroesophageal reflux disease)   . Asthma   . Congestive heart disease ([place])   . Asthma, mild intermittent   . Shortness of breath   . Ischemic colitis ([place]) 10/31/2014  . Hyponatremia 10/31/2014  . Benign essential HTN 10/31/2014  . Hypothyroidism 10/31/2014    Palliative Care Plan    Recommendations/Plan:   PMT will continue to follow with you. PT evaluation. Patient appears comfortable. Will need to further define [place] with son - specifically with regard to feeding tube if she is unable to eat.   Goals of Care and Additional Recommendations:  Limitations on Scope of Treatment: Full Scope Treatment  Code Status:  DNR  Prognosis:   Unable to determine.  Patient is at high risk of an acute life ending event.  Will be able to further define her prognosis has her hospital course evolves.   Discharge Planning:  To Be Determined.  Likely home with support (hospice if symptoms require it)  Care plan was discussed with family.  Thank you for allowing the Palliative Medicine Team to assist in the care of this patient.  Total time spent:  25 min.     Greater than 50%  of this time was spent counseling and coordinating care related to the above assessment and plan.  Florentina Jenny, PA-C Palliative Medicine  Please contact Palliative MedicineTeam phone at [phone] for questions and concerns between 7 am - 7 pm.   Please see AMION for individual provider pager numbers.

## 2018-09-25 NOTE — Progress Notes (Signed)
  Echocardiogram 2D Echocardiogram has been performed.  Darlina Sicilian M 09/25/2018, 11:06 AM

## 2018-09-25 NOTE — Plan of Care (Signed)
Pt is currently unable to void, will perform bladder scan and in and out catheterization as needed per urinary retention protocol

## 2018-09-25 NOTE — Progress Notes (Signed)
Modified Barium Swallow Progress Note  Patient Details  Name: Katie Mosley MRN: [id number] Date of Birth: 1940/11/03  Today's Date: 09/25/2018  Modified Barium Swallow completed.  Full report located under Chart Review in the Imaging Section.  Brief recommendations include the following:  Clinical Impression  Pt's swallow function was inconsistent throughout MBS - neck extension posture significantly worsened pharyngeal contraction and thus resulted in gross residuals of puree/thin. Oral deficits significant mostly with liquids resulting in liquids spilling into pharynx not controlled and resulting in aspiration before the swallow with nectar.  Aspiration after the swallow from pyriform sinus spillage did elicit a delayed cough but not adequately strong to clear pharynx.  Chin tuck posture improved airway protection significantly and prevented aspiration as well as decreased pharyngeal residuals.  Cued dry swallows helpful to clear oral residuals.    Of note, pt appeared with significant residuals in proximal esophagus without sensation of pudding *just below UES without awareness.  Pudding consistency with tablet did not clear this area despite follow up liquid swallows.    Dependent on family's goal for pt, consideration for GI referral recommended given son's report of chronicity of dysphagia for "a long time".    Recommend to start full liquid diet *nectar* and allow thin water between meals. Use straw to faciliate chin down posture for pt.  Son present and wishes to interpret *not ipad interpreter* therefore allowed in xray.  SLP will follow up for pt tolerance, family education.     Swallow Evaluation Recommendations   Recommended Consults: Consider GI evaluation(dependent on goals of care)   SLP Diet Recommendations: Nectar thick liquid(full liquids for now)   Liquid Administration via: Straw   Medication Administration: Crushed with puree   Supervision: Comment(family may  help pt to eat)   Compensations: Slow rate;Small sips/bites(intermittent dry swallow)   Postural Changes: Remain semi-upright after after feeds/meals (Comment);Seated upright at 90 degrees   Oral Care Recommendations: Oral care before and after PO   Other Recommendations: Order thickener from pharmacy(icecream ok)   Luanna Salk, [facility] SLP Acute Rehab Services Pager [phone] Office [phone]  Macario Golds 09/25/2018,10:27 AM

## 2018-09-25 NOTE — CV Procedure (Signed)
Echocardiogram not completed, the patient was off the floor completed another exam. Will try again later. If needed sooner you can page [phone].  Darlina Sicilian RDCS

## 2018-09-26 DIAGNOSIS — I509 Heart failure, unspecified: Secondary | ICD-10-CM

## 2018-09-26 DIAGNOSIS — Z7189 Other specified counseling: Secondary | ICD-10-CM

## 2018-09-26 LAB — BASIC METABOLIC PANEL
Anion gap: 7 (ref 5–15)
BUN: 40 mg/dL — ABNORMAL HIGH (ref 8–23)
CHLORIDE: 103 mmol/L (ref 98–111)
CO2: 20 mmol/L — ABNORMAL LOW (ref 22–32)
Calcium: 7.4 mg/dL — ABNORMAL LOW (ref 8.9–10.3)
Creatinine, Ser: 2.51 mg/dL — ABNORMAL HIGH (ref 0.44–1.00)
GFR calc Af Amer: 21 mL/min — ABNORMAL LOW (ref >60)
GFR calc non Af Amer: 18 mL/min — ABNORMAL LOW (ref >60)
Glucose, Bld: 113 mg/dL — ABNORMAL HIGH (ref 70–99)
POTASSIUM: 5.1 mmol/L (ref 3.5–5.1)
Sodium: 130 mmol/L — ABNORMAL LOW (ref 135–145)

## 2018-09-26 LAB — CBC
HEMATOCRIT: 20.1 % — AB (ref 36.0–46.0)
Hemoglobin: 6.7 g/dL — CL (ref 12.0–15.0)
MCH: 30.9 pg (ref 26.0–34.0)
MCHC: 33.3 g/dL (ref 30.0–36.0)
MCV: 92.6 fL (ref 80.0–100.0)
Platelets: 171 10*3/uL (ref 150–400)
RBC: 2.17 MIL/uL — AB (ref 3.87–5.11)
RDW: 17.5 % — ABNORMAL HIGH (ref 11.5–15.5)
WBC: 6.1 10*3/uL (ref 4.0–10.5)
nRBC: 0.3 % — ABNORMAL HIGH (ref 0.0–0.2)

## 2018-09-26 LAB — ABO/RH: ABO/RH(D): A POS

## 2018-09-26 LAB — HEMOGLOBIN AND HEMATOCRIT, BLOOD
HCT: 28.1 % — ABNORMAL LOW (ref 36.0–46.0)
Hemoglobin: 9.4 g/dL — ABNORMAL LOW (ref 12.0–15.0)

## 2018-09-26 LAB — PREPARE RBC (CROSSMATCH)

## 2018-09-26 MED ORDER — GLYCOPYRROLATE 0.2 MG/ML IJ SOLN
0.2000 mg | Freq: Three times a day (TID) | INTRAMUSCULAR | Status: DC | PRN
  Administered 2018-09-26 – 2018-10-05 (×4): 0.2 mg via INTRAVENOUS
  Filled 2018-09-26 (×6): qty 1

## 2018-09-26 MED ORDER — SODIUM CHLORIDE 0.9 % IV BOLUS
1000.0000 mL | Freq: Once | INTRAVENOUS | Status: AC
  Administered 2018-09-26: 1000 mL via INTRAVENOUS

## 2018-09-26 MED ORDER — SODIUM CHLORIDE 0.9% IV SOLUTION
Freq: Once | INTRAVENOUS | Status: AC
  Administered 2018-09-26: 18:00:00 via INTRAVENOUS

## 2018-09-26 MED ORDER — FUROSEMIDE 10 MG/ML IJ SOLN: 20.0000 mg | Freq: Once | INTRAMUSCULAR | Status: DC

## 2018-09-26 MED ORDER — ORAL CARE MOUTH RINSE
15.0000 mL | Freq: Two times a day (BID) | OROMUCOSAL | Status: DC
  Administered 2018-09-26 – 2018-10-17 (×34): 15 mL via OROMUCOSAL

## 2018-09-26 MED ORDER — FUROSEMIDE 10 MG/ML IJ SOLN
20.0000 mg | Freq: Once | INTRAMUSCULAR | Status: AC
  Administered 2018-09-26: 20 mg via INTRAVENOUS
  Filled 2018-09-26: qty 2

## 2018-09-26 MED ORDER — CHLORHEXIDINE GLUCONATE 0.12 % MT SOLN
15.0000 mL | Freq: Two times a day (BID) | OROMUCOSAL | Status: DC
  Administered 2018-09-26 – 2018-10-17 (×36): 15 mL via OROMUCOSAL
  Filled 2018-09-26 (×34): qty 15

## 2018-09-26 NOTE — Progress Notes (Signed)
CRITICAL VALUE ALERT  Critical Value:  Hbg 6.7  Date & Time Notied:  09/26/2018; 0867  Provider Notified: Alger Memos, NP  Orders Received/Actions taken: None at this time

## 2018-09-26 NOTE — Progress Notes (Signed)
Report received from V. Jimmye Norman, Therapist, sports. I agree with previous RN's assessment. Will continue to monitor closely. Carnella [place] I

## 2018-09-26 NOTE — Progress Notes (Signed)
Daily Progress Note   Patient Name: Katie Mosley       Date: 09/26/2018 DOB: [birth year]  Age: 78 y.o. MRN#: [id number] Attending Physician: [place] Eric J, DO Primary Care Physician: Nolene Ebbs, MD Admit Date: 09/23/2018  Reason for Consultation/Follow-up: Establishing goals of care and Psychosocial/spiritual support  Subjective: Met today with patient son/surrogate at bedside today.  He reports that he has been discussing with family and desires aggressive interventions "for at least four days to see if she gets better."  We discussed his hope with aggressive interventions, and he reports that he believes that she will improve to her prior baseline and continue to get even stronger than that.  I discussed my concern that her condition has continued to decline, including decreasing hgb and increasing Cr today.  He and family have faith that "God will take her when is is His time."  I attempted to explore further regarding CODE STATUS (prior DNR changed to FULL CODE this AM), and her son stated that he and family are invested in Full Code, full scope treatment "for at least a few more days."  Patient Profile/HPI:  78 y.o. female who speaks somalian with past medical history of hypothyroidism, leukopenia, grade 1 diastolic heart failure, and CKD stg 3 who was admitted on 09/23/2018 with lethargy and an inability to swallow.  CT head is negative for acute stroke.  TSH 9.855.  Sodium 123.  She is pancytopenic.  Her troponin is minimally elevated.  Her son provides the history as the patient is not speaking.  He reports she became acutely lethargic and stopped eating.  At baseline she walks and is independent with ADLs - although she walks very slowly with small steps.     Length of Stay:  2  Current Medications: Scheduled Meds:  . sodium chloride   Intravenous Once  . chlorhexidine  15 mL Mouth Rinse BID  . enoxaparin (LOVENOX) injection  30 mg Subcutaneous Q24H  . furosemide  20 mg Intravenous Once  . levothyroxine  37.5 mcg Intravenous Daily  . mouth rinse  15 mL Mouth Rinse q12n4p    Continuous Infusions: . ceFEPime (MAXIPIME) IV 2 g (09/25/18 1712)  . dextrose 5 % and 0.9% NaCl 75 mL/hr at 09/26/18 1100  . sodium chloride 1,000 mL (09/26/18 1230)  PRN Meds: acetaminophen **OR** acetaminophen, glycopyrrolate, hydrALAZINE, ondansetron **OR** ondansetron (ZOFRAN) IV  Physical Exam        Frail elderly female, somnolent mouth open, generalized tremor CV +murmur, regular rate Lungs scattered coarse. Abdomen soft, nt, nd   Vital Signs: BP (!) 145/85 (BP Location: Right Arm)   Pulse 92   Temp 100.2 F (37.9 C) (Rectal)   Resp (!) 28   Ht 4' 11"  (1.499 m)   Wt 49.8 kg   SpO2 95%   BMI 22.17 kg/m  SpO2: SpO2: 95 % O2 Device: O2 Device: Room Air O2 Flow Rate:    Intake/output summary:   Intake/Output Summary (Last 24 hours) at 09/26/2018 1430 Last data filed at 09/26/2018 1100 Gross per 24 hour  Intake 2152.53 ml  Output 0 ml  Net 2152.53 ml   LBM:   Baseline Weight: Weight: 53.5 kg Most recent weight: Weight: 49.8 kg       Palliative Assessment/Data:30%    Flowsheet Rows     Most Recent Value  Intake Tab  Referral Department  Hospitalist  Unit at Time of Referral  ER  Palliative Care Primary Diagnosis  Cardiac  Date Notified  09/24/18  Palliative Care Type  New Palliative care  Reason for referral  Clarify Goals of Care, Psychosocial or Spiritual support  Date of Admission  09/23/18  Date first seen by Palliative Care  09/24/18  # of days Palliative referral response time  0 Day(s)  # of days IP prior to Palliative referral  1  Clinical Assessment  Psychosocial & Spiritual Assessment  Palliative Care Outcomes      Patient  Active Problem List   Diagnosis Date Noted  . Sepsis ([place]) 09/25/2018  . Palliative care encounter   . Dysphagia   . Normocytic anemia 09/07/2018  . Leukopenia 09/07/2018  . CKD (chronic kidney disease), stage IV ([place]) 09/07/2018  . Constipation   . Acute encephalopathy   . Altered mental status 08/08/2015  . 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 AKI (acute kidney injury) ([place]) 08/08/2015  . Hypothermia 02/15/2015  . Pulmonary hypertension ([place]) 02/14/2015  . Elevated TSH 02/14/2015  . Hyperkalemia 02/14/2015  . Pleuritic chest pain 02/13/2015  . GERD (gastroesophageal reflux disease)   . Asthma   . Congestive heart disease ([place])   . Asthma, mild intermittent   . Ischemic colitis ([place]) 10/31/2014  . Hyponatremia 10/31/2014  . Benign essential HTN 10/31/2014  . Hypothyroidism 10/31/2014    Palliative Care Plan    Recommendations/Plan:   - FULL CODE/FULL SCOPE treatment. - PMT to continue to follow and progress conversation as family is emotionally able to do so.  Goals of Care and Additional Recommendations:  Limitations on Scope of Treatment: Full Scope Treatment  Code Status:  Full code  Prognosis:   Guarded.   Discharge Planning:  To Be Determined.   Care plan was discussed with son at bedside  Thank you for allowing the Palliative Medicine Team to assist in the care of this patient.  Total time spent:  30 minutes    Greater than 50%  of this time was spent counseling and coordinating care related to the above assessment and plan.  Micheline Rough, MD [place] Team [phone]  Please contact Palliative MedicineTeam phone at [phone] for questions and concerns between 7 am - 7 pm.   Please see AMION for individual provider pager numbers.

## 2018-09-26 NOTE — Care Management Important Message (Signed)
Important Message  Patient Details  Name: SHENIQUE CHILDERS MRN: [id number] Date of Birth: Oct 25, 1940   Medicare Important Message Given:  Yes    Kerin Salen 09/26/2018, 10:36 AMImportant Message  Patient Details  Name: VINCENTINA SOLLERS MRN: [id number] Date of Birth: 25-May-1941   Medicare Important Message Given:  Yes    Kerin Salen 09/26/2018, 10:36 AM

## 2018-09-26 NOTE — Progress Notes (Signed)
  Speech Language Pathology Treatment: Dysphagia  Patient Details Name: Katie Mosley MRN: [id number] DOB: [birth year] Today's Date: 09/26/2018 Time: 2353-6144 SLP Time Calculation (min) (ACUTE ONLY): 35 min  Assessment / Plan / Recommendation Clinical Impression  Pt asleep in bed with son at bedside.  Son educated on findings of MBS from 09/25/18.  Son expressed her PO intake was orange juice, soup, and yogurt the previous day that she did well with sitting upright and using the chin tuck.  However, the nurse reports pt was laying down and son was using a spoon to feed her.  Son was educated on safe swallowing strategies and adequate posturing for pt during PO trials.  Son also expressed concern for the gurgling sound in pt throat and attempted to clear with the suction.  Clinician informed the son to have pt cough when possible. Son asked to attempt PO trials with SLP observing, however clinician informed son not to attempt PO trials while she is sleeping/lethargic. Also informed son and nurse that head of the bed should be at least 30* at all times due to secretion aspiration.    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  Pt with h/o CHF, renal dysfunction. 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  Results of MBS indicated oropharyngeal phase and pharyngoesophageal phase dysphagia.      SLP Plan  Continue with current plan of care       Recommendations  Diet recommendations: Nectar-thick liquid Medication Administration: Crushed with puree Supervision: Full supervision/cueing for compensatory strategies Compensations: Minimize environmental distractions;Slow rate;Small sips/bites;Chin tuck Postural Changes and/or Swallow Maneuvers: Seated upright 90 degrees;Upright 30-60 min after meal;Chin tuck                Oral Care Recommendations: Oral care QID SLP Visit Diagnosis: Dysphagia, oropharyngeal phase (R13.12);Dysphagia, pharyngoesophageal phase (R13.14) Plan: Continue with current plan of care       [place] 09/26/2018, 9:43 AM

## 2018-09-26 NOTE — Progress Notes (Signed)
SLP Cancellation Note  Patient Details Name: [name] MRN: [id number] DOB: [birth year]   Cancelled treatment:       Reason Eval/Treat Not Completed: Other (comment)(SlP spoke to nurse tech who reports pt continues to be lethargic, will follow up in acute care= not palliative documentation and concern for EOL issues)   Macario Golds 09/26/2018, 3:24 PM    Luanna Salk, [facility] SLP [place] Pager [phone] Office [phone]

## 2018-09-26 NOTE — Progress Notes (Signed)
PROGRESS NOTE    Katie Mosley  WCB:[id number] DOB: 31-Aug-1940 DOA: 09/23/2018 PCP: Nolene Ebbs, MD   Brief Narrative:  Katie Mosley a 78 y.o.femalewithhistory of diastolic CHF last EF measured in March 2017 was 60 to 65% with grade 1 diastolic dysfunction, chronic hyponatremia, chronic kidney disease stage III, chronic anemia, hypothyroidism hypertension presents to the ER with complaints of having fatigue and shortness of breath for the last 1 week. Patient also noticed increasing swelling in the lower extremities. Shortness of breath increased on exertion present even at rest. Denies any chest pain. Has had some productive cough. Patient also has been feeling weak and fatigued easily. Has not had any dizziness fall or loss of consciousness.   ED Course:In the ER patient EKG shows sinus bradycardia with a heart rate around 54 bpm. Nonspecific ST-T changes. Labs reveal sodium of 123 which is markedly low from her previous. On exam patient has bilateral crepitations with lower extremity edema. Chest x-ray shows peripheral congestion. Patient's hemoglobin is at baseline. But does have some leukopenia. Patient is afebrile. Initially patient was given fluids in the ER by ER physician but given that patient has been likely CHF I have discontinued the fluids and start patient on Lasix.   Assessment & Plan:   Principal Problem:   Sepsis ([place]) Active Problems:   Hyponatremia   Hypothyroidism   Hypothermia   Acute encephalopathy   Normocytic anemia   Leukopenia   CKD (chronic kidney disease), stage IV ([place])   Palliative care encounter   Acute metabolic encephalopathy Patient presenting with altered mental status progressive weakness from home.  Etiology likely multifactorial with underlying sepsis/pneumonia versus hyponatremia, versus adult failure to thrive.  Patient was also noted to be hypothermic on admission.  Chest x-ray notable for right-sided infiltrates.   Urinalysis unrevealing. --Continues with dysphasia, speech therapy following, recommend n.p.o. except for medications --Continue IV fluid hydration --Continue treatment for pneumonia with IV antibiotics --Continue supportive care --Palliative care following, appreciate assistance  Pneumonia, suspect gram-negative organism CXR from 1/29 with right-sided haziness consistent with likely infiltrate. --Afebrile, without leukocytosis --Continue cefepime 2 g IV every 24 hours  Dysphasia Per family, has had long-term issues regarding this.  Evaluated by speech therapy once again today with recommendations of nectar thick liquids with medications crushed with pure; but given this, and her current mental status extreme aspiration risk. --Continue supportive measures, but likely need to transition to more of a palliative/comfort approach in the future.  Hypothyroidism Patient is on levothyroxine 50 mcg p.o. daily at home.  Unclear if she was actually taking this as prescribed.  TSH on admission elevated at 9.  Free T4 normal at 1.39. --Continue IV Synthroid at 37.5mg    Hyponatremia Patient presenting with a sodium of 127.  Etiology includes volume depletion/dehydration versus SIADH versus adrenal insufficiency versus hypothyroidism.  TSH elevated at 9.0 with a normal free T4.  Urine sodium 75, urine osmolality 323.  Cortisol level normal. --Restarted levothyroxine through IV given her significant dysphasia --Sodium improving, up to 130 today --Continue IV fluid hydration --Continue to follow BMP daily  Anemia Etiology likely of chronic disease.  Normal MCV.  Iron is 144 with a TIBC of 358, ferritin 139, folate 38. --Hemoglobin down to 6.7 from six 7.6 yesterday. --Transfuse 1 unit PRBCs  CKD stage III Creatinine continues to uptrend.  Baseline creatinine between 1.3-1.6. 1.00-->1.54-->2.51.  Bladder scan without any significant retained urine.  Etiology likely prerenal with dehydration versus  ATN with anemia. --Continue  IV fluid hydration --Transfuse 1 unit PRBCs as above.  Hypertensive urgency On admission, patient was noted to have significant elevated blood pressures and was started on a clonidine patch.  Yesterday she became hypotensive and her clonidine patch was removed. --BP stable today --Continue to monitor off antihypertensives  DVT prophylaxis: Lovenox Code Status: Full code Family Communication: Discussed with son extensively at bedside today. Disposition Plan: Inpatient, if no further progression over the next 2-3 days, recommend hospice consult   Consultants:   Palliative care  Procedures:   TTE 09/25/2018: EF 23-30%, diastolic dysfunction, mild TR/AS, mild Pulm HTN  U/S duplex Bilateral lower extremity: negative for DVT   Antimicrobials:   Cefepime   Subjective:  Patient seen and examined at bedside, son present.  Unresponsive to verbal command.  Continues on IV cefepime for pneumonia.  Discussed with son extensively regarding his mother's progressive decline.  She has multiple issues likely leaves this in addition to adult failure to thrive.  He wishes to continue with current measures for another few days before making any further decisions at this point.  Son did state for DNR status yesterday but has now rescinded and would like full CODE STATUS replaced today.  Patient unable to verbally communicate at all today.  Seen by speech therapy with recommendations of n.p.o. except for medications.  Hemoglobin did drop to 6.7 today, will transfuse 1 unit PRBCs.  No other concerns per nursing overnight.  Objective: Vitals:   09/26/18 0526 09/26/18 1048 09/26/18 1307 09/26/18 1329  BP: (!) 123/52 136/63 (!) 145/85   Pulse: 90 89 92   Resp: (!) 26 (!) 24 (!) 28   Temp: 99.1 F (37.3 C) (!) 100.5 F (38.1 C)  100.2 F (37.9 C)  TempSrc: Oral Rectal  Rectal  SpO2: 98% 99% 95%   Weight: 49.8 kg     Height:        Intake/Output Summary (Last 24  hours) at 09/26/2018 1438 Last data filed at 09/26/2018 1100 Gross per 24 hour  Intake 2152.53 ml  Output 0 ml  Net 2152.53 ml   Filed Weights   09/23/18 1900 09/26/18 0526  Weight: 53.5 kg 49.8 kg    Examination:  General exam: Appears calm and comfortable  Respiratory system: Breath sounds slightly decreased bilateral bases, no wheezing/crackles, normal respiratory effort, on room air. Cardiovascular system: S1 & S2 heard, RRR. No JVD, murmurs, rubs, gallops or clicks. No pedal edema. Gastrointestinal system: Abdomen is nondistended, soft and nontender. No organomegaly or masses felt. Normal bowel sounds heard. Central nervous system: Alert, does not follow command Extremities: Moves all extremities independently Skin: No rashes, lesions or ulcers    Data Reviewed: I have personally reviewed following labs and imaging studies  CBC: Recent Labs  Lab 09/23/18 2031 09/24/18 0605 09/25/18 0552 09/26/18 0539  WBC 2.8* 1.6* 4.1 6.1  HGB 9.2* 8.1* 7.6* 6.7*  HCT 27.0* 23.7* 22.3* 20.1*  MCV 88.5 92.2 91.4 92.6  PLT 214 141* 170 076   Basic Metabolic Panel: Recent Labs  Lab 09/23/18 2031 09/24/18 0605 09/25/18 0552 09/26/18 0539  NA 123* 127* 126* 130*  K 4.6 4.2 4.6 5.1  CL 88* 96* 96* 103  CO2 26 24 22  20*  GLUCOSE 93 84 114* 113*  BUN 27* 23 32* 40*  CREATININE 1.21* 1.00 1.54* 2.51*  CALCIUM 8.6* 8.0* 7.6* 7.4*  MG  --  1.7  --   --    GFR: Estimated Creatinine Clearance: 12.6 mL/min (A) (by  C-G formula based on SCr of 2.51 mg/dL (H)). Liver Function Tests: Recent Labs  Lab 09/24/18 0605  AST 52*  ALT 30  ALKPHOS 80  BILITOT 0.7  PROT 7.1  ALBUMIN 3.0*   No results for input(s): LIPASE, AMYLASE in the last 168 hours. No results for input(s): AMMONIA in the last 168 hours. Coagulation Profile: No results for input(s): INR, PROTIME in the last 168 hours. Cardiac Enzymes: Recent Labs  Lab 09/24/18 0605  TROPONINI 0.06*   BNP (last 3 results) No  results for input(s): PROBNP in the last 8760 hours. HbA1C: No results for input(s): HGBA1C in the last 72 hours. CBG: Recent Labs  Lab 09/23/18 1927 09/24/18 1316 09/25/18 0019  GLUCAP 94 73 147*   Lipid Profile: No results for input(s): CHOL, HDL, LDLCALC, TRIG, CHOLHDL, LDLDIRECT in the last 72 hours. Thyroid Function Tests: Recent Labs    09/24/18 0605 09/24/18 2303  TSH 9.855*  --   FREET4  --  1.39   Anemia Panel: Recent Labs    09/24/18 0605  VITAMINB12 1,046*  FOLATE 38.4  FERRITIN 139  TIBC 358  IRON 144  RETICCTPCT 1.9   Sepsis Labs: No results for input(s): PROCALCITON, LATICACIDVEN in the last 168 hours.  Recent Results (from the past 240 hour(s))  Culture, blood (Routine X 2) w Reflex to ID Panel     Status: None (Preliminary result)   Collection Time: 09/24/18  3:18 PM  Result Value Ref Range Status   Specimen Description   Final    BLOOD RIGHT ARM Performed at [facility] [address], [place], [place] [address]    Special Requests   Final    BOTTLES DRAWN AEROBIC AND ANAEROBIC Blood Culture adequate volume Performed at [facility] [address], [place], [place] [address]    Culture   Final    NO GROWTH 1 DAY Performed at [facility], [place] [address], [place], [place] [address]    Report Status PENDING  Incomplete  Culture, blood (Routine X 2) w Reflex to ID Panel     Status: None (Preliminary result)   Collection Time: 09/24/18 11:03 PM  Result Value Ref Range Status   Specimen Description BLOOD RIGHT HAND  Final   Special Requests   Final    BOTTLES DRAWN AEROBIC AND ANAEROBIC Blood Culture adequate volume   Culture   Final    NO GROWTH 1 DAY Performed at [facility], [place] [address], [place], [place] [address]    Report Status PENDING  Incomplete         Radiology Studies: Dg Chest Port 1 View  Result Date: 09/25/2018 CLINICAL DATA:  Follow-up swallow study. EXAM:  PORTABLE CHEST 1 VIEW COMPARISON:  Radiographs of September 24, 2018. FINDINGS: Stable cardiomegaly is noted. Atherosclerosis of thoracic aorta is noted. No pneumothorax is noted. Minimal bibasilar subsegmental atelectasis is noted with minimal pleural effusions. Bony thorax is unremarkable. Triangular area of increased density is seen in the region of the upper thoracic spine; is uncertain if this represents retained contrast from prior swallowing study. Rounded barium tablet is not clearly identified. IMPRESSION: Minimal bibasilar subsegmental atelectasis is noted with minimal pleural effusions. Triangular area of increased density is seen projected over upper thoracic spine which potentially may represent residual contrast from prior swallowing study. Aortic Atherosclerosis (ICD10-I70.0). Electronically Signed   By: Marijo Conception, M.D.   On: 09/25/2018 13:18   Dg Swallowing Func-speech Pathology  Result Date: 09/25/2018 Objective Swallowing Evaluation: Type of Study: MBS-Modified Barium Swallow Study  Patient Details Name: WENDELL FIEBIG MRN: [id number] Date of Birth: 04/13/1941 Today's Date: 09/25/2018 Time: SLP Start Time (ACUTE ONLY): 0750 -SLP Stop Time (ACUTE ONLY): 0804 SLP Time Calculation (min) (ACUTE ONLY): 14 min Past Medical History: Past Medical History: Diagnosis Date . Adrenal insufficiency ([place])  . Adrenal insufficiency ([place])   Dx'd [facility] 05/2015 . Asthma  . CHF (congestive heart failure) ([place])  . GERD (gastroesophageal reflux disease)  . Glaucoma  . Headache  . HTN (hypertension)  . Hypertension  . Hypothyroidism  . Normocytic anemia 09/07/2018 . Shortness of breath dyspnea  . Thyroid disease   not know in detail Past Surgical History: Past Surgical History: Procedure Laterality Date . APPENDECTOMY   . BACK SURGERY   . CAPSULOTOMY  08/23/2011  Procedure: MINOR CAPSULOTOMY;  Surgeon: Myrtha Mantis.;  Location: [facility];  Service: Ophthalmology;  Laterality: Left;  YAG Capsulotomy Left Eye .  ESOPHAGOGASTRODUODENOSCOPY N/A 03/17/2013  Procedure: ESOPHAGOGASTRODUODENOSCOPY (EGD);  Surgeon: Irene Shipper, MD;  Location: [facility] ENDOSCOPY;  Service: Endoscopy;  Laterality: N/A; . LEFT CATARACT EXTRACTION    2007 . 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  Pt with h/o CHF, renal dysfunction. Swallow evauluation ordered as pt had difficulty swallowing a pill this am.  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   Subjective: pt awake in chair Assessment / Plan / Recommendation CHL IP CLINICAL IMPRESSIONS 09/25/2018 Clinical Impression Pt's swallow function was inconsistent throughout MBS - neck extension posture significantly worsened pharyngeal contraction and thus resulted in gross residuals of puree/thin. Oral deficits significant mostly with liquids resulting in liquids spilling into pharynx not controlled and resulting in aspiration before the swallow with nectar.  Aspiration after the swallow from pyriform sinus spillage did elicit a delayed cough but not adequately strong to clear pharynx.  Chin tuck posture improved airway protection significantly and prevented aspiration as well as decreased pharyngeal residuals.  Cued dry swallows helpful to clear oral residuals.  Of note, pt appeared with significant stasis in proximal esophagus without sensation *just below UES without awareness.  Pudding consistency with tablet did not clear this area despite follow up liquid swallows.  Dependent on family's goal for pt, consideration for GI referral recommended given son's report of chronicity of dysphagia for "a long time".  Recommend to start full liquid diet *nectar* and allow thin water between meals. Use straw to faciliate chin down posture for pt.  Son present and wishes to interpret *not ipad interpreter* therefore  allowed in xray.  SLP will follow up for pt tolerance, family education.   SLP Visit Diagnosis Dysphagia, oropharyngeal phase (R13.12);Dysphagia, pharyngoesophageal phase (R13.14) Attention and concentration deficit following -- Frontal lobe and executive function deficit following -- Impact on safety and function Moderate aspiration risk;Risk for inadequate nutrition/hydration   CHL IP TREATMENT RECOMMENDATION 09/25/2018 Treatment Recommendations Therapy as outlined in treatment plan below   Prognosis 09/25/2018 Prognosis for Safe Diet Advancement Fair Barriers to Reach Goals Time post onset Barriers/Prognosis Comment -- CHL IP DIET RECOMMENDATION 09/25/2018 SLP Diet Recommendations Nectar thick liquid Liquid Administration via -- Medication Administration Crushed with puree Compensations Slow rate;Small sips/bites Postural Changes --   CHL IP OTHER RECOMMENDATIONS 09/25/2018  Recommended Consults Consider GI evaluation Oral Care Recommendations Oral care before and after PO Other Recommendations Order thickener from pharmacy;Have oral suction available   CHL IP FOLLOW UP RECOMMENDATIONS 09/25/2018 Follow up Recommendations (No Data)   CHL IP FREQUENCY AND DURATION 09/25/2018 Speech Therapy Frequency (ACUTE ONLY) min 2x/week Treatment Duration 1 week      CHL IP ORAL PHASE 09/25/2018 Oral Phase Impaired Oral - Pudding Teaspoon -- Oral - Pudding Cup -- Oral - Honey Teaspoon -- Oral - Honey Cup -- Oral - Nectar Teaspoon Weak lingual manipulation;Decreased bolus cohesion;Reduced posterior propulsion;Premature spillage Oral - Nectar Cup -- Oral - Nectar Straw Weak lingual manipulation;Decreased bolus cohesion;Reduced posterior propulsion;Premature spillage;Delayed oral transit Oral - Thin Teaspoon Decreased bolus cohesion;Weak lingual manipulation;Reduced posterior propulsion;Premature spillage;Lingual/palatal residue;Delayed oral transit Oral - Thin Cup -- Oral - Thin Straw Reduced posterior propulsion;Weak lingual  manipulation;Decreased bolus cohesion;Premature spillage;Lingual/palatal residue;Delayed oral transit Oral - Puree Decreased bolus cohesion;Weak lingual manipulation;Lingual pumping;Premature spillage;Lingual/palatal residue;Delayed oral transit Oral - Mech Soft Decreased bolus cohesion;Premature spillage;Delayed oral transit Oral - Regular -- Oral - Multi-Consistency -- Oral - Pill Decreased bolus cohesion;Premature spillage;Weak lingual manipulation;Lingual pumping;Delayed oral transit Oral Phase - Comment --  CHL IP PHARYNGEAL PHASE 09/25/2018 Pharyngeal Phase Impaired Pharyngeal- Pudding Teaspoon -- Pharyngeal -- Pharyngeal- Pudding Cup -- Pharyngeal -- Pharyngeal- Honey Teaspoon -- Pharyngeal -- Pharyngeal- Honey Cup -- Pharyngeal -- Pharyngeal- Nectar Teaspoon -- Pharyngeal -- Pharyngeal- Nectar Cup -- Pharyngeal -- Pharyngeal- Nectar Straw WFL Pharyngeal -- Pharyngeal- Thin Teaspoon Penetration/Aspiration before swallow Pharyngeal Material enters airway, passes BELOW cords without attempt by patient to eject out (silent aspiration) Pharyngeal- Thin Cup -- Pharyngeal -- Pharyngeal- Thin Straw Penetration/Apiration after swallow;Pharyngeal residue - valleculae;Pharyngeal residue - pyriform Pharyngeal Material enters airway, passes BELOW cords without attempt by patient to eject out (silent aspiration);Material enters airway, passes BELOW cords and not ejected out despite cough attempt by patient Pharyngeal- Puree Reduced tongue base retraction;Reduced airway/laryngeal closure;Reduced laryngeal elevation;Pharyngeal residue - valleculae;Pharyngeal residue - pyriform;Reduced pharyngeal peristalsis Pharyngeal -- Pharyngeal- Mechanical Soft Reduced tongue base retraction;Pharyngeal residue - valleculae Pharyngeal -- Pharyngeal- Regular -- Pharyngeal -- Pharyngeal- Multi-consistency -- Pharyngeal -- Pharyngeal- Pill Pharyngeal residue - valleculae Pharyngeal -- Pharyngeal Comment pt with gross residuals in pharynx  intermittently - most notably with head extended upward with swallow, dry swallow attempts are essentially not initiated as very minimal movement observed, aspiration of thin occured after the swallow as pyriform sinus residual spill into open airway - posterior trach,   CHL IP CERVICAL ESOPHAGEAL PHASE 09/25/2018 Cervical Esophageal Phase Impaired Pudding Teaspoon -- Pudding Cup -- Honey Teaspoon -- Honey Cup -- Nectar Teaspoon -- Nectar Cup -- Nectar Straw -- Thin Teaspoon -- Thin Cup -- Thin Straw -- Puree -- Mechanical Soft -- Regular -- Multi-consistency -- Pill -- Cervical Esophageal Comment pt appeared with significant residuals below UES WITHOUT awareness, much more prominent with puree than liquids Katie Mosley 09/25/2018, 10:45 AM Katie Salk, MS [facility] SLP Acute Rehab Services Pager [phone] Office [phone]                   Scheduled Meds: . sodium chloride   Intravenous Once  . chlorhexidine  15 mL Mouth Rinse BID  . enoxaparin (LOVENOX) injection  30 mg Subcutaneous Q24H  . furosemide  20 mg Intravenous Once  . levothyroxine  37.5 mcg Intravenous Daily  . mouth rinse  15 mL Mouth Rinse q12n4p   Continuous Infusions: . ceFEPime (MAXIPIME) IV 2 g (09/25/18 1712)  . dextrose  5 % and 0.9% NaCl 75 mL/hr at 09/26/18 1100  . sodium chloride 1,000 mL (09/26/18 1230)     LOS: 2 days    Time spent: 36 min    Eric J [place] DO [facility] Pager [phone]  If 7PM-7AM, please contact night-coverage [email or web address] Password [facility] 09/26/2018, 2:38 PM

## 2018-09-26 NOTE — Progress Notes (Signed)
As of 1100 patient has not voided/emtpied bladder since 11ish 09/25/2018 bladder scan shows 188cc. Dr. [place] aware 1L bolus ordered. After bolus around 1600 bladder scan shows 227cc. Dr. [place] notified and instructed to continue to bladder scan q4 hours and I&O cath when greater than 400cc.    Patient with low grade fever all day tylenol RE given with no change, per Dr. [place] ok to proceed with blood transfusion. Will continue to monitor closely.

## 2018-09-27 ENCOUNTER — Inpatient Hospital Stay (HOSPITAL_COMMUNITY): Payer: Medicare Other

## 2018-09-27 DIAGNOSIS — A419 Sepsis, unspecified organism: Principal | ICD-10-CM

## 2018-09-27 DIAGNOSIS — I5021 Acute systolic (congestive) heart failure: Secondary | ICD-10-CM

## 2018-09-27 DIAGNOSIS — R6520 Severe sepsis without septic shock: Secondary | ICD-10-CM

## 2018-09-27 DIAGNOSIS — J9600 Acute respiratory failure, unspecified whether with hypoxia or hypercapnia: Secondary | ICD-10-CM

## 2018-09-27 DIAGNOSIS — T68XXXA Hypothermia, initial encounter: Secondary | ICD-10-CM

## 2018-09-27 DIAGNOSIS — D72819 Decreased white blood cell count, unspecified: Secondary | ICD-10-CM

## 2018-09-27 LAB — BASIC METABOLIC PANEL
Anion gap: 9 (ref 5–15)
Anion gap: 7 (ref 5–15)
BUN: 42 mg/dL — ABNORMAL HIGH (ref 8–23)
BUN: 41 mg/dL — ABNORMAL HIGH (ref 8–23)
CHLORIDE: 111 mmol/L (ref 98–111)
CO2: 16 mmol/L — ABNORMAL LOW (ref 22–32)
CO2: 17 mmol/L — ABNORMAL LOW (ref 22–32)
CREATININE: 2.2 mg/dL — AB (ref 0.44–1.00)
Calcium: 7.4 mg/dL — ABNORMAL LOW (ref 8.9–10.3)
Calcium: 7.7 mg/dL — ABNORMAL LOW (ref 8.9–10.3)
Chloride: 113 mmol/L — ABNORMAL HIGH (ref 98–111)
Creatinine, Ser: 1.98 mg/dL — ABNORMAL HIGH (ref 0.44–1.00)
GFR calc Af Amer: 24 mL/min — ABNORMAL LOW (ref >60)
GFR calc Af Amer: 27 mL/min — ABNORMAL LOW (ref >60)
GFR calc non Af Amer: 21 mL/min — ABNORMAL LOW (ref >60)
GFR calc non Af Amer: 24 mL/min — ABNORMAL LOW (ref >60)
Glucose, Bld: 160 mg/dL — ABNORMAL HIGH (ref 70–99)
Glucose, Bld: 118 mg/dL — ABNORMAL HIGH (ref 70–99)
Potassium: 4.3 mmol/L (ref 3.5–5.1)
Potassium: 4.2 mmol/L (ref 3.5–5.1)
Sodium: 136 mmol/L (ref 135–145)
Sodium: 137 mmol/L (ref 135–145)

## 2018-09-27 LAB — TYPE AND SCREEN
ABO/RH(D): A POS
ANTIBODY SCREEN: NEGATIVE
Unit division: 0

## 2018-09-27 LAB — COMPREHENSIVE METABOLIC PANEL
ALT: 25 U/L (ref 0–44)
AST: 59 U/L — ABNORMAL HIGH (ref 15–41)
Albumin: 2.3 g/dL — ABNORMAL LOW (ref 3.5–5.0)
Alkaline Phosphatase: 64 U/L (ref 38–126)
Anion gap: 8 (ref 5–15)
BUN: 41 mg/dL — ABNORMAL HIGH (ref 8–23)
CO2: 17 mmol/L — ABNORMAL LOW (ref 22–32)
Calcium: 7.4 mg/dL — ABNORMAL LOW (ref 8.9–10.3)
Chloride: 111 mmol/L (ref 98–111)
Creatinine, Ser: 2.18 mg/dL — ABNORMAL HIGH (ref 0.44–1.00)
GFR calc Af Amer: 24 mL/min — ABNORMAL LOW (ref >60)
GFR calc non Af Amer: 21 mL/min — ABNORMAL LOW (ref >60)
Glucose, Bld: 160 mg/dL — ABNORMAL HIGH (ref 70–99)
Potassium: 4.3 mmol/L (ref 3.5–5.1)
Sodium: 136 mmol/L (ref 135–145)
Total Bilirubin: 0.6 mg/dL (ref 0.3–1.2)
Total Protein: 5.9 g/dL — ABNORMAL LOW (ref 6.5–8.1)

## 2018-09-27 LAB — AMMONIA: Ammonia: 19 umol/L (ref 9–35)

## 2018-09-27 LAB — BLOOD GAS, ARTERIAL
Acid-base deficit: 5.8 mmol/L — ABNORMAL HIGH (ref 0.0–2.0)
Bicarbonate: 18.2 mmol/L — ABNORMAL LOW (ref 20.0–28.0)
Drawn by: 270211
O2 Content: 1 L/min
O2 Saturation: 97.8 %
Patient temperature: 98.6
pCO2 arterial: 32.3 mmHg (ref 32.0–48.0)
pH, Arterial: 7.371 (ref 7.350–7.450)
pO2, Arterial: 104 mmHg (ref 83.0–108.0)

## 2018-09-27 LAB — BPAM RBC
Blood Product Expiration Date: 202002242359
ISSUE DATE / TIME: 202001311735
Unit Type and Rh: 6200

## 2018-09-27 LAB — BRAIN NATRIURETIC PEPTIDE: B Natriuretic Peptide: 2549.5 pg/mL — ABNORMAL HIGH (ref 0.0–100.0)

## 2018-09-27 LAB — CBC
HEMATOCRIT: 26.5 % — AB (ref 36.0–46.0)
Hemoglobin: 8.6 g/dL — ABNORMAL LOW (ref 12.0–15.0)
MCH: 30.9 pg (ref 26.0–34.0)
MCHC: 32.5 g/dL (ref 30.0–36.0)
MCV: 95.3 fL (ref 80.0–100.0)
Platelets: 157 10*3/uL (ref 150–400)
RBC: 2.78 MIL/uL — ABNORMAL LOW (ref 3.87–5.11)
RDW: 16.9 % — ABNORMAL HIGH (ref 11.5–15.5)
WBC: 10.9 10*3/uL — ABNORMAL HIGH (ref 4.0–10.5)
nRBC: 0.2 % (ref 0.0–0.2)

## 2018-09-27 LAB — LACTIC ACID, PLASMA
Lactic Acid, Venous: 1.2 mmol/L (ref 0.5–1.9)
Lactic Acid, Venous: 1.1 mmol/L (ref 0.5–1.9)

## 2018-09-27 LAB — GLUCOSE, CAPILLARY
GLUCOSE-CAPILLARY: 128 mg/dL — AB (ref 70–99)
Glucose-Capillary: 175 mg/dL — ABNORMAL HIGH (ref 70–99)

## 2018-09-27 LAB — TSH: TSH: 5.717 u[IU]/mL — ABNORMAL HIGH (ref 0.350–4.500)

## 2018-09-27 LAB — MRSA PCR SCREENING: MRSA by PCR: NEGATIVE

## 2018-09-27 LAB — MAGNESIUM: Magnesium: 1.7 mg/dL (ref 1.7–2.4)

## 2018-09-27 MED ORDER — LORAZEPAM 2 MG/ML IJ SOLN
0.5000 mg | Freq: Four times a day (QID) | INTRAMUSCULAR | Status: DC | PRN
  Administered 2018-09-27 (×3): 0.5 mg via INTRAVENOUS
  Filled 2018-09-27 (×3): qty 1

## 2018-09-27 MED ORDER — LORAZEPAM 2 MG/ML IJ SOLN
0.5000 mg | Freq: Once | INTRAMUSCULAR | Status: AC
  Administered 2018-09-27: 0.5 mg via INTRAVENOUS
  Filled 2018-09-27: qty 1

## 2018-09-27 MED ORDER — SORBITOL 70 % SOLN
960.0000 mL | TOPICAL_OIL | Freq: Once | ORAL | Status: AC
  Administered 2018-09-27: 960 mL via RECTAL
  Filled 2018-09-27: qty 473

## 2018-09-27 MED ORDER — HALOPERIDOL LACTATE 5 MG/ML IJ SOLN
0.5000 mg | Freq: Three times a day (TID) | INTRAMUSCULAR | Status: DC
  Administered 2018-09-27 – 2018-09-28 (×2): 0.5 mg via INTRAVENOUS
  Filled 2018-09-27 (×2): qty 1

## 2018-09-27 MED ORDER — METOCLOPRAMIDE HCL 5 MG/ML IJ SOLN
5.0000 mg | Freq: Four times a day (QID) | INTRAMUSCULAR | Status: DC
  Administered 2018-09-27 – 2018-09-28 (×3): 5 mg via INTRAVENOUS
  Filled 2018-09-27 (×3): qty 2

## 2018-09-27 MED ORDER — FUROSEMIDE 10 MG/ML IJ SOLN
80.0000 mg | Freq: Two times a day (BID) | INTRAMUSCULAR | Status: DC
  Administered 2018-09-27 – 2018-09-28 (×2): 80 mg via INTRAVENOUS
  Filled 2018-09-27 (×2): qty 8

## 2018-09-27 MED ORDER — BUDESONIDE 0.25 MG/2ML IN SUSP
0.2500 mg | Freq: Two times a day (BID) | RESPIRATORY_TRACT | Status: DC
  Administered 2018-09-27 – 2018-10-17 (×41): 0.25 mg via RESPIRATORY_TRACT
  Filled 2018-09-27 (×41): qty 2

## 2018-09-27 MED ORDER — MAGNESIUM SULFATE 2 GM/50ML IV SOLN
2.0000 g | Freq: Once | INTRAVENOUS | Status: AC
  Administered 2018-09-27: 2 g via INTRAVENOUS
  Filled 2018-09-27: qty 50

## 2018-09-27 MED ORDER — IPRATROPIUM-ALBUTEROL 0.5-2.5 (3) MG/3ML IN SOLN
3.0000 mL | Freq: Four times a day (QID) | RESPIRATORY_TRACT | Status: DC
  Administered 2018-09-27 (×3): 3 mL via RESPIRATORY_TRACT
  Filled 2018-09-27 (×4): qty 3

## 2018-09-27 MED ORDER — FUROSEMIDE 10 MG/ML IJ SOLN
80.0000 mg | Freq: Once | INTRAMUSCULAR | Status: AC
  Administered 2018-09-27: 80 mg via INTRAVENOUS
  Filled 2018-09-27: qty 8

## 2018-09-27 MED ORDER — IPRATROPIUM-ALBUTEROL 0.5-2.5 (3) MG/3ML IN SOLN
3.0000 mL | Freq: Three times a day (TID) | RESPIRATORY_TRACT | Status: DC
  Administered 2018-09-28: 3 mL via RESPIRATORY_TRACT
  Filled 2018-09-27: qty 3

## 2018-09-27 NOTE — Progress Notes (Signed)
RRT called to pt bedside to evaluate pt "jerking" activity.  Pt unable to follow commands and nonverbal.  Moans and groans noted.  Only the " jerking " reported to be new. Pupils reactive.  See flowsheet for VS.  NP at the bedside as well to evaluate pt status.  New orders received and initiated.

## 2018-09-27 NOTE — Significant Event (Addendum)
Rapid Response Event Note  Overview: Arrival Time: 0900 Event Type: Other (Comment) Following up with patient due to jerking movements previous shift and into early morning. Notified Katie, bedside RN, before assessing patient. Upon arrival to room patient appeared to be resting but started having jerking like movements on right side of body. These were more prominent in right upper extremity. Patient felt warm to the touch but afebrile. Patient's grand-daughter is a Therapist, sports by trade.   Initial Focused Assessment: Neuro: Patient responds to stimulation, but cannot follow commands. Patient does have language barrier. Family at beside and would not follow commands for them. Patient was alert to voice, and localized to pain in all extremities except the left upper extremity. Patient was guarding left upper extremity but withdrew to pain. CT was completed overnight but no evidence of acute intracranial abnormalities. Pupils were 1-2+, PERRLA intact.  Cardiac: SB-NSR, HR 60s-80s, BP slightly HTN 158/98 HR 67 Pulmonary: Patient appeared to be trying to clearing throat, audible grunting sound frequently O2 Sats 100%, 2LNC, No apparent distress at this time, RR 22-24.   Interventions: Notified MD Ogbata in regards to jerking movements and patient's clinical status  -Transfer to SDU order placed -STAT Lactic Acid, Ammonia, ABG -BMET, BNP  Plan of Care (if not transferred):        Katie Mosley C

## 2018-09-27 NOTE — Progress Notes (Signed)
Met briefly with son this AM.  He denies needs and reports plan to continue aggressive interventions.  Will plan to follow-up early next week.  Please call if any immediate concerns.  NO CHARGE NOTE  Micheline Rough, MD [place] [facility] [phone]

## 2018-09-27 NOTE — Progress Notes (Signed)
Paged by bedside RN regarding the pt experiencing some involuntary BUE movements. Upon entering room pt is in NAD and resting in bed. Pt at baseline does not follow commands and has not been saying much. Pt is Alert, pupils are equal and reactive bilaterally, unable to assess grips, withdraws to pain in BUE and BLE. No gross focal deficits noted. VSS.   There is slight twitching in her BUE which is new per the granddaughter who is at the bedside. Does not appear to be a seizure at this time. Appears to be some myoclonus jerking. Stat head CT ordered and we will recheck her electrolytes including a magnesium level as well. Explained plan to pts granddaughter who was agreeable and expressed no concerns or questions at that time.   Arby Barrette AGPCNP-BC, AGNP-C [facility] Pager [phone]  Addendum: Stat Head CT revealed "No intracranial abnormalities." Bedside RN to page with lab results when available.

## 2018-09-27 NOTE — Progress Notes (Signed)
PROGRESS NOTE    Katie Mosley  FXT:[id number] DOB: Jan 21, 1941 DOA: 09/23/2018 PCP: Nolene Ebbs, MD   Brief Narrative:  Katie Mosley a 78 y.o.femalewithhistory of diastolic CHF last EF measured in March 2017 was 60 to 65% with grade 1 diastolic dysfunction, chronic hyponatremia, chronic kidney disease stage III, chronic anemia, hypothyroidism hypertension presents to the ER with complaints of having fatigue and shortness of breath for the last 1 week. Patient also noticed increasing swelling in the lower extremities. Shortness of breath increased on exertion present even at rest. Denies any chest pain. Has had some productive cough. Patient also has been feeling weak and fatigued easily. Has not had any dizziness fall or loss of consciousness.   ED Course:In the ER patient EKG shows sinus bradycardia with a heart rate around 54 bpm. Nonspecific ST-T changes. Labs reveal sodium of 123 which is markedly low from her previous. On exam patient has bilateral crepitations with lower extremity edema. Chest x-ray shows peripheral congestion. Patient's hemoglobin is at baseline. But does have some leukopenia. Patient is afebrile. Initially patient was given fluids in the ER by ER physician but given that patient has been likely CHF I have discontinued the fluids and start patient on Lasix.  09/27/2018: Patient reported to have developed altered mental status as well as jerky movement involving the right upper extremity.  This is not seizure-like.  Lactic acid done is within normal range.  We will proceed and work-up possible underlying causes.  Patient will be transferred to ICU.   Assessment & Plan:   Principal Problem:   Sepsis ([place]) Active Problems:   Hyponatremia   Hypothyroidism   Hypothermia   Acute encephalopathy   Normocytic anemia   Leukopenia   CKD (chronic kidney disease), stage IV ([place])   Palliative care encounter   Acute encephalopathy, probably  combined toxic and metabolic encephalopathy, now with jerking movements: Patient presenting with altered mental status progressive weakness from home.  Etiology likely multifactorial with underlying sepsis/pneumonia versus hyponatremia, versus adult failure to thrive.  Patient was also noted to be hypothermic on admission.  Chest x-ray notable for right-sided infiltrates.  Urinalysis unrevealing. --Continues with dysphasia, speech therapy following, recommend n.p.o. except for medications --Continue IV fluid hydration --Continue treatment for pneumonia with IV antibiotics --Continue supportive care --Palliative care following, appreciate assistance 09/27/2018: Transfer patient to ICU/stepdown unit.  Check ammonia, lactic acid, chest x-ray, abdominal x-ray, cardiac BNP, ABG.  Continue antibiotics.  Diurese the patient.  IV Ativan as needed.  Discussed CODE STATUS with the patient's son, and family want patient to be full code for now.  Further management will depend on hospital course.  Pneumonia, suspect gram-negative organism CXR from 1/29 with right-sided haziness consistent with likely infiltrate. --Afebrile, without leukocytosis --Continue cefepime 2 g IV every 24 hours To 09/15/2018: Complete course of antibiotics.  Dysphasia Per family, has had long-term issues regarding this.  Evaluated by speech therapy once again today with recommendations of nectar thick liquids with medications crushed with pure; but given this, and her current mental status extreme aspiration risk. --Continue supportive measures, but likely need to transition to more of a palliative/comfort approach in the future.  Hypothyroidism Patient is on levothyroxine 50 mcg p.o. daily at home.  Unclear if she was actually taking this as prescribed.  TSH on admission elevated at 9.  Free T4 normal at 1.39. --Continue IV Synthroid at 37.5mg   -09/27/2018: Check TSH.  Hyponatremia Patient presenting with a sodium of 127.  Etiology  includes volume depletion/dehydration versus SIADH versus adrenal insufficiency versus hypothyroidism.  TSH elevated at 9.0 with a normal free T4.  Urine sodium 75, urine osmolality 323.  Cortisol level normal. --Restarted levothyroxine through IV given her significant dysphasia --Sodium improving, up to 130 today --Continue IV fluid hydration --Continue to follow BMP daily To 09/15/2018: Resolved.  Anemia Etiology likely of chronic disease.  Normal MCV.  Iron is 144 with a TIBC of 358, ferritin 139, folate 38. --Hemoglobin down to 6.7 from six 7.6 yesterday. --Transfuse 1 unit PRBCs  CKD stage III Creatinine continues to uptrend.  Baseline creatinine between 1.3-1.6. 1.00-->1.54-->2.51.  Bladder scan without any significant retained urine.  Etiology likely prerenal with dehydration versus ATN with anemia. --Continue IV fluid hydration --Transfuse 1 unit PRBCs as above. 09/27/2018: Continue to monitor.  Also monitor electrolytes and correct accordingly.  Hypertensive urgency On admission, patient was noted to have significant elevated blood pressures and was started on a clonidine patch.  Yesterday she became hypotensive and her clonidine patch was removed. --BP stable today --Continue to monitor off antihypertensives 09/27/2018: Blood pressure ranges from 131-155/49 mmHg.  Continue to monitor closely.  DVT prophylaxis: Lovenox Code Status: Full code Family Communication: Discussed with son extensively at bedside today. Disposition Plan: This will depend on hospital course. Consultants:   Palliative care  Procedures:   TTE 09/25/2018: EF 13-08%, diastolic dysfunction, mild TR/AS, mild Pulm HTN  U/S duplex Bilateral lower extremity: negative for DVT   Antimicrobials:   Cefepime   Subjective: Patient seen alongside patient's son and granddaughter who is an ICU nurse.  Also discussed with patient's granddaughter in the [place] that is a physician.  History from the patient.  Patient  continues to experience jerking of the right upper extremity.  Objective: Vitals:   09/27/18 1103 09/27/18 1140 09/27/18 1200 09/27/18 1300  BP: (!) 155/49  (!) 131/43 (!) 151/48  Pulse: 63  68 67  Resp: 17  15 15   Temp:  97.7 F (36.5 C)    TempSrc:  Axillary    SpO2: 100%  100% 100%  Weight:      Height:        Intake/Output Summary (Last 24 hours) at 09/27/2018 1442 Last data filed at 09/27/2018 1430 Gross per 24 hour  Intake 1578.69 ml  Output 2175 ml  Net -596.31 ml   Filed Weights   09/23/18 1900 09/26/18 0526 09/27/18 0500  Weight: 53.5 kg 49.8 kg 52.4 kg    Examination:  General exam: In distress.  Thickening of the right upper extremity.  JVD is equivocal.  The neck veins are engorged. Respiratory system: Decreased air entry. Cardiovascular system: S1 & S2 heard.  Fullness of the ankle. Gastrointestinal system: Abdomen is nondistended, soft and nontender.  No palpable organs.   Central nervous system: Awake, but not fully following commands.   Extremities: Fullness of the ankle.  Data Reviewed: I have personally reviewed following labs and imaging studies  CBC: Recent Labs  Lab 09/23/18 2031 09/24/18 0605 09/25/18 0552 09/26/18 0539 09/26/18 2239 09/27/18 0411  WBC 2.8* 1.6* 4.1 6.1  --  10.9*  HGB 9.2* 8.1* 7.6* 6.7* 9.4* 8.6*  HCT 27.0* 23.7* 22.3* 20.1* 28.1* 26.5*  MCV 88.5 92.2 91.4 92.6  --  95.3  PLT 214 141* 170 171  --  657   Basic Metabolic Panel: Recent Labs  Lab 09/24/18 0605 09/25/18 0552 09/26/18 0539 09/27/18 0411 09/27/18 1017  NA 127* 126* 130* 136  136 137  K  4.2 4.6 5.1 4.3  4.3 4.2  CL 96* 96* 103 111  111 113*  CO2 24 22 20* 17*  16* 17*  GLUCOSE 84 114* 113* 160*  160* 118*  BUN 23 32* 40* 41*  42* 41*  CREATININE 1.00 1.54* 2.51* 2.18*  2.20* 1.98*  CALCIUM 8.0* 7.6* 7.4* 7.4*  7.4* 7.7*  MG 1.7  --   --  1.7  --    GFR: Estimated Creatinine Clearance: 17.3 mL/min (A) (by C-G formula based on SCr of 1.98  mg/dL (H)). Liver Function Tests: Recent Labs  Lab 09/24/18 0605 09/27/18 0411  AST 52* 59*  ALT 30 25  ALKPHOS 80 64  BILITOT 0.7 0.6  PROT 7.1 5.9*  ALBUMIN 3.0* 2.3*   No results for input(s): LIPASE, AMYLASE in the last 168 hours. Recent Labs  Lab 09/27/18 0926  AMMONIA 19   Coagulation Profile: No results for input(s): INR, PROTIME in the last 168 hours. Cardiac Enzymes: Recent Labs  Lab 09/24/18 0605  TROPONINI 0.06*   BNP (last 3 results) No results for input(s): PROBNP in the last 8760 hours. HbA1C: No results for input(s): HGBA1C in the last 72 hours. CBG: Recent Labs  Lab 09/23/18 1927 09/24/18 1316 09/25/18 0019 09/27/18 0248 09/27/18 0904  GLUCAP 94 73 147* 175* 128*   Lipid Profile: No results for input(s): CHOL, HDL, LDLCALC, TRIG, CHOLHDL, LDLDIRECT in the last 72 hours. Thyroid Function Tests: Recent Labs    09/24/18 2303 09/27/18 1249  TSH  --  5.717*  FREET4 1.39  --    Anemia Panel: No results for input(s): VITAMINB12, FOLATE, FERRITIN, TIBC, IRON, RETICCTPCT in the last 72 hours. Sepsis Labs: Recent Labs  Lab 09/27/18 0926 09/27/18 1249  LATICACIDVEN 1.2 1.1    Recent Results (from the past 240 hour(s))  Culture, blood (Routine X 2) w Reflex to ID Panel     Status: None (Preliminary result)   Collection Time: 09/24/18  3:18 PM  Result Value Ref Range Status   Specimen Description BLOOD RIGHT ARM  Final   Special Requests   Final    BOTTLES DRAWN AEROBIC AND ANAEROBIC Blood Culture adequate volume Performed at [facility] [address] [place], [place] [address]    Culture NO GROWTH 2 DAYS  Final   Report Status PENDING  Incomplete  Culture, blood (Routine X 2) w Reflex to ID Panel     Status: None (Preliminary result)   Collection Time: 09/24/18 11:03 PM  Result Value Ref Range Status   Specimen Description BLOOD RIGHT HAND  Final   Special Requests   Final    BOTTLES DRAWN AEROBIC AND ANAEROBIC  Blood Culture adequate volume Performed at [facility], [place] [address] [place], [place] [address]    Culture NO GROWTH 2 DAYS  Final   Report Status PENDING  Incomplete  MRSA PCR Screening     Status: None   Collection Time: 09/27/18  9:44 AM  Result Value Ref Range Status   MRSA by PCR NEGATIVE NEGATIVE Final    Comment:        The GeneXpert MRSA Assay (FDA approved for NASAL specimens only), is one component of a comprehensive MRSA colonization surveillance program. It is not intended to diagnose MRSA infection nor to guide or monitor treatment for MRSA infections. Performed at [facility], [place] [address] [place],  [address]          Radiology Studies: Dg Abd  1 View  Result Date: 09/27/2018 CLINICAL DATA:  Encounter for feeding tube placement. EXAM: ABDOMEN - 1 VIEW COMPARISON:  Radiograph of same day.  CT scan of June 22, 2018. FINDINGS: No small bowel dilatation is noted. Residual contrast is seen in the colon. Colonic dilatation is noted suggesting possible ileus. No abnormal calcifications are noted. IMPRESSION: Colonic dilatation is noted in left side of abdomen concerning for possible ileus. Electronically Signed   By: Marijo Conception, M.D.   On: 09/27/2018 12:21   Ct Head Wo Contrast  Result Date: 09/27/2018 CLINICAL DATA:  Neurological deficits with involuntary movements. EXAM: CT HEAD WITHOUT CONTRAST TECHNIQUE: Contiguous axial images were obtained from the base of the skull through the vertex without intravenous contrast. COMPARISON:  09/24/2018 FINDINGS: Brain: Diffuse cerebral atrophy. Ventricular dilatation consistent with central atrophy. Low-attenuation changes in the deep white matter consistent with small vessel ischemia. Old parenchymal calcifications in the basal ganglia and left cerebellum without change. No mass effect or midline shift. No abnormal extra-axial fluid collections. Gray-white matter junctions are distinct.  Basal cisterns are not effaced. No acute intracranial hemorrhage. Vascular: Prominent intracranial arterial vascular calcifications are present. Skull: Calvarium appears intact. No acute displaced fractures identified. Sinuses/Orbits: Paranasal sinuses and mastoid air cells are clear. Other: No significant change since prior study. IMPRESSION: No acute intracranial abnormalities. Chronic atrophy and small vessel ischemic changes. Electronically Signed   By: Lucienne Capers M.D.   On: 09/27/2018 03:56   Dg Chest Port 1 View  Result Date: 09/27/2018 CLINICAL DATA:  Acute respiratory failure. EXAM: PORTABLE CHEST 1 VIEW COMPARISON:  Chest x-ray dated September 25, 2018. FINDINGS: The patient is rotated to the left, limiting evaluation. Stable cardiomegaly. Unchanged mild interstitial edema, small bilateral pleural effusions, and bibasilar atelectasis. Apparent increased density at the right lung base is likely due to rotation. No pneumothorax. No acute osseous abnormality. IMPRESSION: 1. Unchanged mild interstitial edema, small bilateral pleural effusions, and bibasilar atelectasis. Electronically Signed   By: Titus Dubin M.D.   On: 09/27/2018 10:33        Scheduled Meds: . budesonide (PULMICORT) nebulizer solution  0.25 mg Nebulization BID  . chlorhexidine  15 mL Mouth Rinse BID  . enoxaparin (LOVENOX) injection  30 mg Subcutaneous Q24H  . furosemide  80 mg Intravenous BID  . ipratropium-albuterol  3 mL Nebulization Q6H  . levothyroxine  37.5 mcg Intravenous Daily  . mouth rinse  15 mL Mouth Rinse q12n4p  . metoCLOPramide (REGLAN) injection  5 mg Intravenous Q6H   Continuous Infusions: . ceFEPime (MAXIPIME) IV 2 g (09/26/18 1830)  . dextrose 5 % and 0.9% NaCl 10 mL/hr at 09/27/18 1131     LOS: 3 days    Time spent: 51 min    Bonnell Public, DO [facility] Pager [phone]  If 7PM-7AM, please contact night-coverage [email or web address] Password [email or web address] 09/27/2018, 2:42 PM

## 2018-09-28 ENCOUNTER — Inpatient Hospital Stay (HOSPITAL_COMMUNITY): Payer: Medicare Other

## 2018-09-28 DIAGNOSIS — G9341 Metabolic encephalopathy: Secondary | ICD-10-CM

## 2018-09-28 DIAGNOSIS — I5033 Acute on chronic diastolic (congestive) heart failure: Secondary | ICD-10-CM

## 2018-09-28 LAB — URINALYSIS, ROUTINE W REFLEX MICROSCOPIC
Bilirubin Urine: NEGATIVE
Glucose, UA: NEGATIVE mg/dL
Ketones, ur: NEGATIVE mg/dL
Nitrite: NEGATIVE
Protein, ur: 100 mg/dL — AB
RBC / HPF: >50 RBC/hpf — ABNORMAL HIGH (ref 0–5)
Specific Gravity, Urine: 1.015 (ref 1.005–1.030)
WBC, UA: >50 WBC/hpf — ABNORMAL HIGH (ref 0–5)
pH: 6 (ref 5.0–8.0)

## 2018-09-28 LAB — BASIC METABOLIC PANEL
Anion gap: 8 (ref 5–15)
BUN: 42 mg/dL — ABNORMAL HIGH (ref 8–23)
CO2: 19 mmol/L — ABNORMAL LOW (ref 22–32)
Calcium: 8 mg/dL — ABNORMAL LOW (ref 8.9–10.3)
Chloride: 113 mmol/L — ABNORMAL HIGH (ref 98–111)
Creatinine, Ser: 1.92 mg/dL — ABNORMAL HIGH (ref 0.44–1.00)
GFR calc Af Amer: 28 mL/min — ABNORMAL LOW (ref >60)
GFR, EST NON AFRICAN AMERICAN: 25 mL/min — AB (ref >60)
Glucose, Bld: 102 mg/dL — ABNORMAL HIGH (ref 70–99)
Potassium: 4.1 mmol/L (ref 3.5–5.1)
Sodium: 140 mmol/L (ref 135–145)

## 2018-09-28 LAB — CBC WITH DIFFERENTIAL/PLATELET
Abs Immature Granulocytes: 0.04 10*3/uL (ref 0.00–0.07)
Basophils Absolute: 0 10*3/uL (ref 0.0–0.1)
Basophils Relative: 0 %
Eosinophils Absolute: 0.2 10*3/uL (ref 0.0–0.5)
Eosinophils Relative: 3 %
HCT: 25.1 % — ABNORMAL LOW (ref 36.0–46.0)
Hemoglobin: 8.2 g/dL — ABNORMAL LOW (ref 12.0–15.0)
Immature Granulocytes: 1 %
Lymphocytes Relative: 9 %
Lymphs Abs: 0.6 10*3/uL — ABNORMAL LOW (ref 0.7–4.0)
MCH: 31.3 pg (ref 26.0–34.0)
MCHC: 32.7 g/dL (ref 30.0–36.0)
MCV: 95.8 fL (ref 80.0–100.0)
Monocytes Absolute: 0.7 10*3/uL (ref 0.1–1.0)
Monocytes Relative: 10 %
Neutro Abs: 5.5 10*3/uL (ref 1.7–7.7)
Neutrophils Relative %: 77 %
Platelets: 142 10*3/uL — ABNORMAL LOW (ref 150–400)
RBC: 2.62 MIL/uL — ABNORMAL LOW (ref 3.87–5.11)
RDW: 17.1 % — ABNORMAL HIGH (ref 11.5–15.5)
WBC: 7.1 10*3/uL (ref 4.0–10.5)
nRBC: 0 % (ref 0.0–0.2)

## 2018-09-28 LAB — RENAL FUNCTION PANEL
Albumin: 2.5 g/dL — ABNORMAL LOW (ref 3.5–5.0)
Anion gap: 8 (ref 5–15)
BUN: 45 mg/dL — ABNORMAL HIGH (ref 8–23)
CO2: 19 mmol/L — ABNORMAL LOW (ref 22–32)
Calcium: 8 mg/dL — ABNORMAL LOW (ref 8.9–10.3)
Chloride: 113 mmol/L — ABNORMAL HIGH (ref 98–111)
Creatinine, Ser: 1.98 mg/dL — ABNORMAL HIGH (ref 0.44–1.00)
GFR calc Af Amer: 27 mL/min — ABNORMAL LOW (ref >60)
GFR calc non Af Amer: 24 mL/min — ABNORMAL LOW (ref >60)
Glucose, Bld: 100 mg/dL — ABNORMAL HIGH (ref 70–99)
Phosphorus: 5 mg/dL — ABNORMAL HIGH (ref 2.5–4.6)
Potassium: 4 mmol/L (ref 3.5–5.1)
Sodium: 140 mmol/L (ref 135–145)

## 2018-09-28 LAB — CORTISOL: Cortisol, Plasma: 18.3 ug/dL

## 2018-09-28 LAB — T4, FREE: Free T4: 2 ng/dL — ABNORMAL HIGH (ref 0.82–1.77)

## 2018-09-28 LAB — MAGNESIUM: Magnesium: 2.4 mg/dL (ref 1.7–2.4)

## 2018-09-28 MED ORDER — CARVEDILOL 3.125 MG PO TABS: 3.1250 mg | ORAL_TABLET | Freq: Two times a day (BID) | ORAL | Status: DC

## 2018-09-28 MED ORDER — HALOPERIDOL LACTATE 5 MG/ML IJ SOLN
0.5000 mg | Freq: Three times a day (TID) | INTRAMUSCULAR | Status: DC
  Administered 2018-09-28 – 2018-09-29 (×3): 0.5 mg via INTRAVENOUS
  Filled 2018-09-28 (×3): qty 1

## 2018-09-28 MED ORDER — SODIUM CHLORIDE 3 % IN NEBU
4.0000 mL | INHALATION_SOLUTION | Freq: Once | RESPIRATORY_TRACT | Status: AC | PRN
  Administered 2018-09-28: 4 mL via RESPIRATORY_TRACT
  Filled 2018-09-28: qty 4

## 2018-09-28 MED ORDER — FUROSEMIDE 10 MG/ML IJ SOLN
80.0000 mg | Freq: Two times a day (BID) | INTRAMUSCULAR | Status: DC
  Administered 2018-09-28: 80 mg via INTRAVENOUS
  Filled 2018-09-28: qty 8

## 2018-09-28 MED ORDER — IPRATROPIUM-ALBUTEROL 0.5-2.5 (3) MG/3ML IN SOLN
3.0000 mL | Freq: Two times a day (BID) | RESPIRATORY_TRACT | Status: DC
  Administered 2018-09-28 – 2018-10-13 (×30): 3 mL via RESPIRATORY_TRACT
  Filled 2018-09-28 (×30): qty 3

## 2018-09-28 MED ORDER — METOPROLOL TARTRATE 5 MG/5ML IV SOLN
5.0000 mg | Freq: Four times a day (QID) | INTRAVENOUS | Status: DC
  Administered 2018-09-28 – 2018-10-02 (×15): 5 mg via INTRAVENOUS
  Filled 2018-09-28 (×15): qty 5

## 2018-09-28 MED ORDER — MORPHINE SULFATE (PF) 2 MG/ML IV SOLN
2.0000 mg | Freq: Once | INTRAVENOUS | Status: AC
  Administered 2018-09-28: 2 mg via INTRAVENOUS
  Filled 2018-09-28: qty 1

## 2018-09-28 NOTE — Progress Notes (Signed)
Rectal Temp 97.0 F. MD paged. Waiting new orders. Warm blankets and temperature in room turned up.

## 2018-09-28 NOTE — Progress Notes (Signed)
Pt. Had thick mucus secretions. Patient tolerated well however unable to fully clean patient's mouth. Mouth swabbed and cleaned multiple times. Hard palate still has thick dark mucus and unable to clean out, due to patient not following commands.

## 2018-09-28 NOTE — Progress Notes (Signed)
Pharmacy Antibiotic Note  Katie Mosley is a 78 y.o. female admitted on 09/23/2018 with febrile neutropenia (hypothermia, pancytopenia).  Pharmacy has been consulted for cefepime dosing.  SCr increased, 1.9 with CrCl ~ 17.8 ml/min  Plan:  Continue Cefepime 2g IV q24h  Follow up renal fxn, culture results, and clinical course.   Height: 4\' 11"  (149.9 cm) Weight: 114 lb 10.2 oz (52 kg) IBW/kg (Calculated) : 43.2  Temp (24hrs), Avg:97.5 F (36.4 C), Min:96.6 F (35.9 C), Max:98.1 F (36.7 C)  Recent Labs  Lab 09/24/18 0605 09/25/18 0552 09/26/18 0539 09/27/18 0411 09/27/18 0926 09/27/18 1017 09/27/18 1249 09/28/18 0342  WBC 1.6* 4.1 6.1 10.9*  --   --   --  7.1  CREATININE 1.00 1.54* 2.51* 2.18*  2.20*  --  1.98*  --  1.98*  1.92*  LATICACIDVEN  --   --   --   --  1.2  --  1.1  --     Estimated Creatinine Clearance: 17.8 mL/min (A) (by C-G formula based on SCr of 1.92 mg/dL (H)).    No Known Allergies  Antimicrobials this admission: 1/29 Cefepime >>   Dose adjustments this admission:   Microbiology results: 1/29 BCx:  NGTD  Thank you for allowing pharmacy to be a part of this patient's care.  Dolly Rias RPh 09/28/2018, 9:59 AM Pager [phone]

## 2018-09-28 NOTE — Progress Notes (Addendum)
@   0400 frank red colored urine noted in foley bag/tubing. Measured 160mL. No odor. On call NP notified/orders given. Interpreted CPOT is 3. See MAR for intervention. Will continue to monitor  Additional 2mL frank red colored urine @ 0650am

## 2018-09-28 NOTE — Progress Notes (Signed)
PROGRESS NOTE    Katie Mosley  IWL:[id number] DOB: [birth year] DOA: 09/23/2018 PCP: Nolene Ebbs, MD   Brief Narrative:  Katie Mosley a 78 y.o.femalewithhistory of diastolic CHF last EF measured in March 2017 was 60 to 65% with grade 1 diastolic dysfunction, chronic hyponatremia, chronic kidney disease stage III, chronic anemia, hypothyroidism hypertension presents to the ER with complaints of having fatigue and shortness of breath for the last 1 week. Patient also noticed increasing swelling in the lower extremities. Shortness of breath increased on exertion present even at rest. Denies any chest pain. Has had some productive cough. Patient also has been feeling weak and fatigued easily. Has not had any dizziness fall or loss of consciousness.   ED Course:In the ER patient EKG shows sinus bradycardia with a heart rate around 54 bpm. Nonspecific ST-T changes. Labs reveal sodium of 123 which is markedly low from her previous. On exam patient has bilateral crepitations with lower extremity edema. Chest x-ray shows peripheral congestion. Patient's hemoglobin is at baseline. But does have some leukopenia. Patient is afebrile. Initially patient was given fluids in the ER by ER physician but given that patient has been likely CHF I have discontinued the fluids and start patient on Lasix.  09/27/2018: Patient reported to have developed altered mental status as well as jerky movement involving the right upper extremity.  This is not seizure-like.  Lactic acid done is within normal range.  We will proceed and work-up possible underlying causes.  Patient will be transferred to ICU.  09/28/2018: Patient seen.  Patient looks a lot better today.  Patient was started on IV Haldol 0.5 mg every 8 hourly last evening.  Patient is more communicative today, though, may be giving inappropriate answers due to acute encephalopathy.  We will continue hold off for now.  Due to language barrier,  his difficult to ascertain if patient has underlying dementing process.  More importantly, this is not the time to assess for select accurately.  Will discontinue Zofran and Reglan, and will get an EKG to assess QTc interval.  We will continue to optimize electrolytes, specifically, potassium and magnesium.  Continue to optimize CHF treatment (acute on chronic diastolic congestive heart failure).  We will continue IV Lasix to tomorrow.  Continue to monitor renal function.  Complete course of antibiotics for possible pneumonic process.  Overall, long-term prognosis is guarded.  Further management will depend on hospital course.   Assessment & Plan:   Principal Problem:   Sepsis ([place]) Active Problems:   Hyponatremia   Hypothyroidism   Hypothermia   Acute encephalopathy   Normocytic anemia   Leukopenia   CKD (chronic kidney disease), stage IV ([place])   Palliative care encounter   Acute encephalopathy, probably combined toxic and metabolic encephalopathy, now with jerking movements: Patient presenting with altered mental status progressive weakness from home.  Etiology likely multifactorial with underlying sepsis/pneumonia versus hyponatremia, versus adult failure to thrive.  Patient was also noted to be hypothermic on admission.  Chest x-ray notable for right-sided infiltrates.  Urinalysis unrevealing. --Continues with dysphasia, speech therapy following, recommend n.p.o. except for medications --Continue IV fluid hydration --Continue treatment for pneumonia with IV antibiotics --Continue supportive care --Palliative care following, appreciate assistance 09/27/2018: Transfer patient to ICU/stepdown unit.  Check ammonia, lactic acid, chest x-ray, abdominal x-ray, cardiac BNP, ABG.  Continue antibiotics.  Diurese the patient.  IV Ativan as needed.  Discussed CODE STATUS with the patient's son, and family want patient to be full code for  now.  Further management will depend on hospital  course. 09/28/2018: Patient is slowly improving.  Pain is more interactive today.  Events noted yesterday and may have component of psychosis as part of the clinical syndrome.  Continue IV haloperidol for now.  Pneumonia, suspect gram-negative organism CXR from 1/29 with right-sided haziness consistent with likely infiltrate. --Afebrile, without leukocytosis --Continue cefepime 2 g IV every 24 hours 09/28/2018: Complete course of antibiotics.  Acute on chronic diastolic congestive heart failure: -Continue IV Lasix until tomorrow, 09/29/2018. -Kindly reassess volume status tomorrow and adjust diuretics accordingly. -Start patient on Coreg 3.125 Mg p.o. twice daily. -Further management will depend on hospital course.  Hypothyroidism Patient is on levothyroxine 50 mcg p.o. daily at home.  Unclear if she was actually taking this as prescribed.  TSH on admission elevated at 9.  Free T4 normal at 1.39. --Continue IV Synthroid at 37.5mg   -09/27/2018: Check TSH.  Hyponatremia Patient presenting with a sodium of 127.  Etiology includes volume depletion/dehydration versus SIADH versus adrenal insufficiency versus hypothyroidism.  TSH elevated at 9.0 with a normal free T4.  Urine sodium 75, urine osmolality 323.  Cortisol level normal. --Restarted levothyroxine through IV given her significant dysphasia --Sodium improving, up to 130 today --Continue IV fluid hydration --Continue to follow BMP daily To 09/15/2018: Resolved.  Anemia Etiology likely of chronic disease.  Normal MCV.  Iron is 144 with a TIBC of 358, ferritin 139, folate 38. --Hemoglobin down to 6.7 from six 7.6 yesterday. --Transfuse 1 unit PRBCs  CKD stage III Creatinine continues to uptrend.  Baseline creatinine between 1.3-1.6. 1.00-->1.54-->2.51.  Bladder scan without any significant retained urine.  Etiology likely prerenal with dehydration versus ATN with anemia. --Continue IV fluid hydration --Transfuse 1 unit PRBCs as  above. 09/27/2018: Continue to monitor.  Also monitor electrolytes and correct accordingly. 09/28/2018: Serum creatinine is 1.98 today BUN is 45.  Continue to monitor closely as patient is on IV Lasix.  Hypertensive urgency On admission, patient was noted to have significant elevated blood pressures and was started on a clonidine patch.  Yesterday she became hypotensive and her clonidine patch was removed. --BP stable today --Continue to monitor off antihypertensives 09/27/2018: Blood pressure ranges from 131-155/49 mmHg.  Continue to monitor closely. 09/28/2018: Blood pressure is better controlled.  Will add Coreg 3.25 mg p.o. twice daily as patient has diastolic congestive heart failure, currently acute on chronic.  DVT prophylaxis: Lovenox Code Status: Full code Family Communication: Discussed with son extensively at bedside today. Disposition Plan: This will depend on hospital course. Consultants:   Palliative care  Procedures:   TTE 09/25/2018: EF 54-00%, diastolic dysfunction, mild TR/AS, mild Pulm HTN  U/S duplex Bilateral lower extremity: negative for DVT   Antimicrobials:   Cefepime   Subjective: Patient seen alongside patient's son.  No other significant history from patient.  However, patient is more communicative today.  Patient is able to attempt answering son's questions.  No significant jerky movements of the right upper extremity noted.  Patient looks significantly less paranoid today.  Objective: Vitals:   09/28/18 0500 09/28/18 0600 09/28/18 0700 09/28/18 0800  BP:  (!) 156/90 (!) 126/43 (!) 156/64  Pulse:  97 75 88  Resp:  19 11 20   Temp:  (!) 97.5 F (36.4 C) (!) 97.5 F (36.4 C) (!) 97.2 F (36.2 C)  TempSrc:      SpO2:  100% 100% 100%  Weight: 52 kg     Height:  Intake/Output Summary (Last 24 hours) at 09/28/2018 0854 Last data filed at 09/28/2018 0650 Gross per 24 hour  Intake 367.33 ml  Output 1955 ml  Net -1587.67 ml   Filed Weights    09/26/18 0526 09/27/18 0500 09/28/18 0500  Weight: 49.8 kg 52.4 kg 52 kg    Examination:  General exam: Not in any distress.  Patient is awake and alert.   Respiratory system: Clear to auscultation. Cardiovascular system: S1 & S2 heard.   Gastrointestinal system: Abdomen is nondistended, soft and nontender.  No palpable organs.   Central nervous system: Awake and alert.  Patient moves all limbs.  Data Reviewed: I have personally reviewed following labs and imaging studies  CBC: Recent Labs  Lab 09/24/18 0605 09/25/18 0552 09/26/18 0539 09/26/18 2239 09/27/18 0411 09/28/18 0342  WBC 1.6* 4.1 6.1  --  10.9* 7.1  NEUTROABS  --   --   --   --   --  5.5  HGB 8.1* 7.6* 6.7* 9.4* 8.6* 8.2*  HCT 23.7* 22.3* 20.1* 28.1* 26.5* 25.1*  MCV 92.2 91.4 92.6  --  95.3 95.8  PLT 141* 170 171  --  157 213*   Basic Metabolic Panel: Recent Labs  Lab 09/24/18 0605 09/25/18 0552 09/26/18 0539 09/27/18 0411 09/27/18 1017 09/28/18 0342  NA 127* 126* 130* 136  136 137 140  140  K 4.2 4.6 5.1 4.3  4.3 4.2 4.0  4.1  CL 96* 96* 103 111  111 113* 113*  113*  CO2 24 22 20* 17*  16* 17* 19*  19*  GLUCOSE 84 114* 113* 160*  160* 118* 100*  102*  BUN 23 32* 40* 41*  42* 41* 45*  42*  CREATININE 1.00 1.54* 2.51* 2.18*  2.20* 1.98* 1.98*  1.92*  CALCIUM 8.0* 7.6* 7.4* 7.4*  7.4* 7.7* 8.0*  8.0*  MG 1.7  --   --  1.7  --  2.4  PHOS  --   --   --   --   --  5.0*   GFR: Estimated Creatinine Clearance: 17.8 mL/min (A) (by C-G formula based on SCr of 1.92 mg/dL (H)). Liver Function Tests: Recent Labs  Lab 09/24/18 0605 09/27/18 0411 09/28/18 0342  AST 52* 59*  --   ALT 30 25  --   ALKPHOS 80 64  --   BILITOT 0.7 0.6  --   PROT 7.1 5.9*  --   ALBUMIN 3.0* 2.3* 2.5*   No results for input(s): LIPASE, AMYLASE in the last 168 hours. Recent Labs  Lab 09/27/18 0926  AMMONIA 19   Coagulation Profile: No results for input(s): INR, PROTIME in the last 168 hours. Cardiac  Enzymes: Recent Labs  Lab 09/24/18 0605  TROPONINI 0.06*   BNP (last 3 results) No results for input(s): PROBNP in the last 8760 hours. HbA1C: No results for input(s): HGBA1C in the last 72 hours. CBG: Recent Labs  Lab 09/23/18 1927 09/24/18 1316 09/25/18 0019 09/27/18 0248 09/27/18 0904  GLUCAP 94 73 147* 175* 128*   Lipid Profile: No results for input(s): CHOL, HDL, LDLCALC, TRIG, CHOLHDL, LDLDIRECT in the last 72 hours. Thyroid Function Tests: Recent Labs    09/27/18 1249  TSH 5.717*  FREET4 2.00*   Anemia Panel: No results for input(s): VITAMINB12, FOLATE, FERRITIN, TIBC, IRON, RETICCTPCT in the last 72 hours. Sepsis Labs: Recent Labs  Lab 09/27/18 0926 09/27/18 1249  LATICACIDVEN 1.2 1.1    Recent Results (from the past 240 hour(s))  Culture, blood (Routine X 2) w Reflex to ID Panel     Status: None (Preliminary result)   Collection Time: 09/24/18  3:18 PM  Result Value Ref Range Status   Specimen Description BLOOD RIGHT ARM  Final   Special Requests   Final    BOTTLES DRAWN AEROBIC AND ANAEROBIC Blood Culture adequate volume Performed at [facility] [address] [place], [place] [address]    Culture NO GROWTH 3 DAYS  Final   Report Status PENDING  Incomplete  Culture, blood (Routine X 2) w Reflex to ID Panel     Status: None (Preliminary result)   Collection Time: 09/24/18 11:03 PM  Result Value Ref Range Status   Specimen Description BLOOD RIGHT HAND  Final   Special Requests   Final    BOTTLES DRAWN AEROBIC AND ANAEROBIC Blood Culture adequate volume Performed at [facility], [place] [address] [place], [place] [address]    Culture NO GROWTH 3 DAYS  Final   Report Status PENDING  Incomplete  MRSA PCR Screening     Status: None   Collection Time: 09/27/18  9:44 AM  Result Value Ref Range Status   MRSA by PCR NEGATIVE NEGATIVE Final    Comment:        The GeneXpert MRSA Assay (FDA approved for NASAL specimens only),  is one component of a comprehensive MRSA colonization surveillance program. It is not intended to diagnose MRSA infection nor to guide or monitor treatment for MRSA infections. Performed at [facility], [place] [address], [place], [place] [address]          Radiology Studies: Dg Abd 1 View  Result Date: 09/28/2018 CLINICAL DATA:  Ileus EXAM: ABDOMEN - 1 VIEW COMPARISON:  09/27/2018 FINDINGS: Nonobstructive bowel gas pattern. Mild residual contrast within ascending and transverse colon. No colonic dilatation. Degenerative changes of the lumbar spine. IMPRESSION: Unremarkable abdominal radiograph. Electronically Signed   By: Julian Hy M.D.   On: 09/28/2018 05:08   Dg Abd 1 View  Result Date: 09/27/2018 CLINICAL DATA:  Encounter for feeding tube placement. EXAM: ABDOMEN - 1 VIEW COMPARISON:  Radiograph of same day.  CT scan of June 22, 2018. FINDINGS: No small bowel dilatation is noted. Residual contrast is seen in the colon. Colonic dilatation is noted suggesting possible ileus. No abnormal calcifications are noted. IMPRESSION: Colonic dilatation is noted in left side of abdomen concerning for possible ileus. Electronically Signed   By: Marijo Conception, M.D.   On: 09/27/2018 12:21   Ct Head Wo Contrast  Result Date: 09/27/2018 CLINICAL DATA:  Neurological deficits with involuntary movements. EXAM: CT HEAD WITHOUT CONTRAST TECHNIQUE: Contiguous axial images were obtained from the base of the skull through the vertex without intravenous contrast. COMPARISON:  09/24/2018 FINDINGS: Brain: Diffuse cerebral atrophy. Ventricular dilatation consistent with central atrophy. Low-attenuation changes in the deep white matter consistent with small vessel ischemia. Old parenchymal calcifications in the basal ganglia and left cerebellum without change. No mass effect or midline shift. No abnormal extra-axial fluid collections. Gray-white matter junctions are distinct. Basal cisterns  are not effaced. No acute intracranial hemorrhage. Vascular: Prominent intracranial arterial vascular calcifications are present. Skull: Calvarium appears intact. No acute displaced fractures identified. Sinuses/Orbits: Paranasal sinuses and mastoid air cells are clear. Other: No significant change since prior study. IMPRESSION: No acute intracranial abnormalities. Chronic atrophy and small vessel ischemic changes. Electronically Signed   By: Lucienne Capers M.D.   On: 09/27/2018 03:56  Dg Chest Port 1 View  Result Date: 09/27/2018 CLINICAL DATA:  Acute respiratory failure. EXAM: PORTABLE CHEST 1 VIEW COMPARISON:  Chest x-ray dated September 25, 2018. FINDINGS: The patient is rotated to the left, limiting evaluation. Stable cardiomegaly. Unchanged mild interstitial edema, small bilateral pleural effusions, and bibasilar atelectasis. Apparent increased density at the right lung base is likely due to rotation. No pneumothorax. No acute osseous abnormality. IMPRESSION: 1. Unchanged mild interstitial edema, small bilateral pleural effusions, and bibasilar atelectasis. Electronically Signed   By: Titus Dubin M.D.   On: 09/27/2018 10:33        Scheduled Meds: . budesonide (PULMICORT) nebulizer solution  0.25 mg Nebulization BID  . chlorhexidine  15 mL Mouth Rinse BID  . enoxaparin (LOVENOX) injection  30 mg Subcutaneous Q24H  . furosemide  80 mg Intravenous BID  . haloperidol lactate  0.5 mg Intravenous Q8H  . ipratropium-albuterol  3 mL Nebulization TID  . levothyroxine  37.5 mcg Intravenous Daily  . mouth rinse  15 mL Mouth Rinse q12n4p   Continuous Infusions: . ceFEPime (MAXIPIME) IV Stopped (09/27/18 1840)  . dextrose 5 % and 0.9% NaCl 10 mL/hr at 09/28/18 0500     LOS: 4 days    Time spent: 35 min.    Bonnell Public, DO [facility] Pager [phone]  If 7PM-7AM, please contact night-coverage [email or web address] Password [facility] 09/28/2018, 8:54 AM

## 2018-09-29 ENCOUNTER — Inpatient Hospital Stay (HOSPITAL_COMMUNITY): Payer: Medicare Other

## 2018-09-29 ENCOUNTER — Inpatient Hospital Stay (HOSPITAL_COMMUNITY)
Admit: 2018-09-29 | Discharge: 2018-09-29 | Disposition: A | Discharge status: Home / Self Care | Payer: Medicare Other | Attending: Internal Medicine | Admitting: Internal Medicine

## 2018-09-29 DIAGNOSIS — J9601 Acute respiratory failure with hypoxia: Secondary | ICD-10-CM

## 2018-09-29 DIAGNOSIS — E871 Hypo-osmolality and hyponatremia: Secondary | ICD-10-CM

## 2018-09-29 LAB — RENAL FUNCTION PANEL
Albumin: 2.3 g/dL — ABNORMAL LOW (ref 3.5–5.0)
Anion gap: 11 (ref 5–15)
BUN: 48 mg/dL — ABNORMAL HIGH (ref 8–23)
CO2: 17 mmol/L — ABNORMAL LOW (ref 22–32)
Calcium: 8.4 mg/dL — ABNORMAL LOW (ref 8.9–10.3)
Chloride: 117 mmol/L — ABNORMAL HIGH (ref 98–111)
Creatinine, Ser: 2.21 mg/dL — ABNORMAL HIGH (ref 0.44–1.00)
GFR calc Af Amer: 24 mL/min — ABNORMAL LOW (ref >60)
GFR calc non Af Amer: 21 mL/min — ABNORMAL LOW (ref >60)
Glucose, Bld: 71 mg/dL (ref 70–99)
Phosphorus: 4.6 mg/dL (ref 2.5–4.6)
Potassium: 3.8 mmol/L (ref 3.5–5.1)
Sodium: 145 mmol/L (ref 135–145)

## 2018-09-29 LAB — CBC WITH DIFFERENTIAL/PLATELET
Abs Immature Granulocytes: 0.03 10*3/uL (ref 0.00–0.07)
Basophils Absolute: 0 10*3/uL (ref 0.0–0.1)
Basophils Relative: 0 %
Eosinophils Absolute: 0.3 10*3/uL (ref 0.0–0.5)
Eosinophils Relative: 5 %
HCT: 25.5 % — ABNORMAL LOW (ref 36.0–46.0)
Hemoglobin: 8.2 g/dL — ABNORMAL LOW (ref 12.0–15.0)
Immature Granulocytes: 1 %
Lymphocytes Relative: 9 %
Lymphs Abs: 0.4 10*3/uL — ABNORMAL LOW (ref 0.7–4.0)
MCH: 31.3 pg (ref 26.0–34.0)
MCHC: 32.2 g/dL (ref 30.0–36.0)
MCV: 97.3 fL (ref 80.0–100.0)
Monocytes Absolute: 0.6 10*3/uL (ref 0.1–1.0)
Monocytes Relative: 12 %
Neutro Abs: 3.5 10*3/uL (ref 1.7–7.7)
Neutrophils Relative %: 73 %
Platelets: 167 10*3/uL (ref 150–400)
RBC: 2.62 MIL/uL — ABNORMAL LOW (ref 3.87–5.11)
RDW: 17.2 % — ABNORMAL HIGH (ref 11.5–15.5)
WBC: 4.8 10*3/uL (ref 4.0–10.5)
nRBC: 0.6 % — ABNORMAL HIGH (ref 0.0–0.2)

## 2018-09-29 LAB — MAGNESIUM: Magnesium: 2.3 mg/dL (ref 1.7–2.4)

## 2018-09-29 LAB — PROCALCITONIN: Procalcitonin: 0.36 ng/mL

## 2018-09-29 MED ORDER — CLONIDINE HCL 0.1 MG/24HR TD PTWK
0.1000 mg | MEDICATED_PATCH | TRANSDERMAL | Status: DC
  Administered 2018-09-29: 0.1 mg via TRANSDERMAL
  Filled 2018-09-29: qty 1

## 2018-09-29 MED ORDER — HYDRALAZINE HCL 20 MG/ML IJ SOLN
5.0000 mg | INTRAMUSCULAR | Status: DC | PRN
  Administered 2018-09-29 – 2018-10-05 (×9): 10 mg via INTRAVENOUS
  Filled 2018-09-29 (×9): qty 1

## 2018-09-29 MED ORDER — FUROSEMIDE 40 MG PO TABS: 40.0000 mg | ORAL_TABLET | Freq: Two times a day (BID) | ORAL | Status: DC

## 2018-09-29 NOTE — Progress Notes (Signed)
PROGRESS NOTE    Katie Mosley  HDQ:[id number] DOB: [birth year] DOA: 09/23/2018 PCP: Nolene Ebbs, MD   Brief Narrative:  Katie Mosley a 78 y.o.femalewithhistory of diastolic CHF last EF measured in March 2017 was 60 to 65% with grade 1 diastolic dysfunction, chronic hyponatremia, chronic kidney disease stage III, chronic anemia, hypothyroidism hypertension presents to the ER with complaints of having fatigue and shortness of breath for the last 1 week. Patient also noticed increasing swelling in the lower extremities.  Has had some productive cough. Patient also has been feeling weak and fatigued easily. Has not had any dizziness fall or loss of consciousness.   ED Course:In the ER patient EKG shows sinus bradycardia with a heart rate around 54 bpm. Nonspecific ST-T changes. Labs reveal sodium of 123 which is markedly low from her previous. On exam patient has bilateral crepitations with lower extremity edema. Chest x-ray shows peripheral congestion. Patient's hemoglobin is at baseline. But does have some leukopenia. Patient is afebrile. Initially patient was given fluids in the ER by ER physician but given that patient has been likely CHF I have discontinued the fluids and start patient on Lasix.  09/27/2018:   Patient   transferred to ICU.      Assessment & Plan:   Principal Problem:   Sepsis ([place]) Active Problems:   Hyponatremia   Hypothyroidism   Hypothermia   Acute encephalopathy   Normocytic anemia   Leukopenia   CKD (chronic kidney disease), stage IV ([place])   Palliative care encounter   Acute encephalopathy, probably combined toxic and metabolic encephalopathy, now with jerking movements: Patient presenting with altered mental status progressive weakness from home.  Etiology likely multifactorial with underlying sepsis/pneumonia versus hyponatremia, versus adult failure to thrive.  Patient was also noted to be hypothermic on admission.  Chest x-ray  notable for right-sided infiltrates.  Urinalysis unrevealing. Blood cultures  No growth so far --Continues with  Nectar thick liquid as per speech therapy recommendations, --Continue treatment for pneumonia with IV antibiotics --Continue supportive care --Palliative care following, appreciate assistance 09/27/2018: Transfer patient to ICU/stepdown unit due to altered mental status.    Ammonia 19 , lactic acid 1.2 , chest x-ray shows increased density in the right lung, abdominal x-ray concerning for possible ileus, cardiac BNP trending up, now 2549 , could be secondary to worsening renal function, ABG within normal limits.  Continue antibiotics.     Discussed CODE STATUS with the patient's son, and family want patient to be full code for now.  Further management will depend on hospital course. Given dysarthria,aphasia, son is questioning whether stroke needs to be ruled out, therefore MRI of the brain ordered and pending     Pneumonia, suspect gram-negative organism  CXR from 1/29 with right-sided haziness consistent with likely infiltrate. --Afebrile, without leukocytosis --patient has been on antibiotics since 1/29, initially meropenem, now on cefepime 2 g IV every 24 hours DC antibiotics as patient has completed 5 day course and monitor, will check pro-calcitonin, respiratory panel, influenza PCR due to persistent low-grade fever Suspect chronic   aspiration as per speech therapy evaluation noted on 1/30  Acute on chronic diastolic congestive heart failure: -discontinue IV Lasix  This patient's creatinine/sodium is trending up -patient appears euvolemic,, switch to Lasix 40 twice a day by mouth - continue IV metoprolol    Follow renal function closely  Hypothyroidism Patient is on levothyroxine 50 mcg p.o. daily at home.  Unclear if she was actually taking this as prescribed.  TSH on admission elevated at 9.  Free T4 normal at 1.39. --Continue IV Synthroid at 37.5mg      Hyponatremia,  resolved Patient presenting with a sodium of 127.  Etiology includes volume depletion/dehydration versus SIADH versus adrenal insufficiency versus hypothyroidism.  TSH elevated at 9.0 with a normal free T4.  Urine sodium 75, urine osmolality 323.  Cortisol level normal.   --Continue to follow BMP daily  Chronic dysphagia Currently speech therapy recommends nectar thick liquids, GI evaluation but the patient is not medically stable for invasive procedures    Anemia Etiology likely of chronic disease.  Normal MCV.  Iron is 144 with a TIBC of 358, ferritin 139, folate 38. --Hemoglobin down to 6.7 from six 7.6 yesterday. Status post 1 unit of packed red blood cells, hemoglobin currently stable at 9.4> 8.6. Will check FOBT, may benefit from EGD due to chronic dysphagia 1 stable  CKD stage III Creatinine continues to uptrend.  Baseline creatinine between 1.3-1.6. creatinine has been trending up, now 2.21 .  Bladder scan without any significant retained urine.  Etiology likely prerenal with dehydration/diuresis versus ATN with anemia. -discontinue IV Lasix and switched to oral Lasix starting tomorrow Trend creatinine   Hypertensive urgency On admission, patient was noted to have significant elevated blood pressures and was started on a clonidine patch.  subsequently became hypotensive and her clonidine patch was removed. --BP in the 170s today, resume clonidine patch at a lower dose    DVT prophylaxis: Lovenox Code Status: Full code Family Communication: Discussed with son extensively at bedside today. Disposition Plan: This will depend on hospital course. Consultants:   Palliative care  Procedures:   TTE 09/25/2018: EF 70-17%, diastolic dysfunction, mild TR/AS, mild Pulm HTN  U/S duplex Bilateral lower extremity: negative for DVT   Antimicrobials:  Anti-infectives (From admission, onward)   Start     Dose/Rate Route Frequency Ordered Stop   09/25/18 1800  ceFEPIme (MAXIPIME) 2 g  in sodium chloride 0.9 % 100 mL IVPB     2 g 200 mL/hr over 30 Minutes Intravenous Every 24 hours 09/25/18 1239     09/25/18 1230  ceFEPIme (MAXIPIME) 2 g in sodium chloride 0.9 % 100 mL IVPB  Status:  Discontinued     2 g 200 mL/hr over 30 Minutes Intravenous Every 24 hours 09/25/18 1219 09/25/18 1238   09/25/18 0600  ceFEPIme (MAXIPIME) 2 g in sodium chloride 0.9 % 100 mL IVPB  Status:  Discontinued     2 g 200 mL/hr over 30 Minutes Intravenous Every 12 hours 09/24/18 1634 09/25/18 1219   09/24/18 1645  ceFEPIme (MAXIPIME) 2 g in sodium chloride 0.9 % 100 mL IVPB     2 g 200 mL/hr over 30 Minutes Intravenous STAT 09/24/18 1634 09/24/18 2000   09/24/18 1630  meropenem (MERREM) 1 g in sodium chloride 0.9 % 100 mL IVPB  Status:  Discontinued     1 g 200 mL/hr over 30 Minutes Intravenous Every 12 hours 09/24/18 1523 09/24/18 1630        Subjective: Patient seen alongside patient's son. Patient is less communicative today than she was yesterday. She was unable to also any questions, she is almost a phasic,   Objective: Vitals:   09/29/18 0100 09/29/18 0400 09/29/18 0500 09/29/18 0600  BP: (!) 191/61 (!) 167/41 (!) 172/49 (!) 175/46  Pulse: 88 75 76 76  Resp: (!) 21 (!) 30 (!) 29 16  Temp: 99.1 F (37.3 C) 100 F (37.8 C) 100.2  F (37.9 C) 100 F (37.8 C)  TempSrc:      SpO2: 96% 95% 96% 96%  Weight:      Height:        Intake/Output Summary (Last 24 hours) at 09/29/2018 0747 Last data filed at 09/29/2018 0600 Gross per 24 hour  Intake 272.57 ml  Output 1875 ml  Net -1602.43 ml   Filed Weights   09/26/18 0526 09/27/18 0500 09/28/18 0500  Weight: 49.8 kg 52.4 kg 52 kg    Examination:  General exam: Not in any distress.  Patient is awake and alert.   Respiratory system: Clear to auscultation. Cardiovascular system: S1 & S2 heard.   Gastrointestinal system: Abdomen is nondistended, soft and nontender.  No palpable organs.   Central nervous system: Awake but nonverbal.   Patient moves all limbs.  Data Reviewed: I have personally reviewed following labs and imaging studies  CBC: Recent Labs  Lab 09/25/18 0552 09/26/18 0539 09/26/18 2239 09/27/18 0411 09/28/18 0342 09/29/18 0354  WBC 4.1 6.1  --  10.9* 7.1 4.8  NEUTROABS  --   --   --   --  5.5 3.5  HGB 7.6* 6.7* 9.4* 8.6* 8.2* 8.2*  HCT 22.3* 20.1* 28.1* 26.5* 25.1* 25.5*  MCV 91.4 92.6  --  95.3 95.8 97.3  PLT 170 171  --  157 142* 001   Basic Metabolic Panel: Recent Labs  Lab 09/24/18 0605  09/26/18 0539 09/27/18 0411 09/27/18 1017 09/28/18 0342 09/29/18 0354  NA 127*   < > 130* 136  136 137 140  140 145  K 4.2   < > 5.1 4.3  4.3 4.2 4.0  4.1 3.8  CL 96*   < > 103 111  111 113* 113*  113* 117*  CO2 24   < > 20* 17*  16* 17* 19*  19* 17*  GLUCOSE 84   < > 113* 160*  160* 118* 100*  102* 71  BUN 23   < > 40* 41*  42* 41* 45*  42* 48*  CREATININE 1.00   < > 2.51* 2.18*  2.20* 1.98* 1.98*  1.92* 2.21*  CALCIUM 8.0*   < > 7.4* 7.4*  7.4* 7.7* 8.0*  8.0* 8.4*  MG 1.7  --   --  1.7  --  2.4 2.3  PHOS  --   --   --   --   --  5.0* 4.6   < > = values in this interval not displayed.   GFR: Estimated Creatinine Clearance: 15.5 mL/min (A) (by C-G formula based on SCr of 2.21 mg/dL (H)). Liver Function Tests: Recent Labs  Lab 09/24/18 0605 09/27/18 0411 09/28/18 0342 09/29/18 0354  AST 52* 59*  --   --   ALT 30 25  --   --   ALKPHOS 80 64  --   --   BILITOT 0.7 0.6  --   --   PROT 7.1 5.9*  --   --   ALBUMIN 3.0* 2.3* 2.5* 2.3*   No results for input(s): LIPASE, AMYLASE in the last 168 hours. Recent Labs  Lab 09/27/18 0926  AMMONIA 19   Coagulation Profile: No results for input(s): INR, PROTIME in the last 168 hours. Cardiac Enzymes: Recent Labs  Lab 09/24/18 0605  TROPONINI 0.06*   BNP (last 3 results) No results for input(s): PROBNP in the last 8760 hours. HbA1C: No results for input(s): HGBA1C in the last 72 hours. CBG: Recent Labs  Lab  09/23/18 1927 09/24/18 1316 09/25/18 0019 09/27/18 0248 09/27/18 0904  GLUCAP 94 73 147* 175* 128*   Lipid Profile: No results for input(s): CHOL, HDL, LDLCALC, TRIG, CHOLHDL, LDLDIRECT in the last 72 hours. Thyroid Function Tests: Recent Labs    09/27/18 1249  TSH 5.717*  FREET4 2.00*   Anemia Panel: No results for input(s): VITAMINB12, FOLATE, FERRITIN, TIBC, IRON, RETICCTPCT in the last 72 hours. Sepsis Labs: Recent Labs  Lab 09/27/18 0926 09/27/18 1249  LATICACIDVEN 1.2 1.1    Recent Results (from the past 240 hour(s))  Culture, blood (Routine X 2) w Reflex to ID Panel     Status: None (Preliminary result)   Collection Time: 09/24/18  3:18 PM  Result Value Ref Range Status   Specimen Description   Final    BLOOD RIGHT ARM Performed at [facility] [address], [place], [place] [address]    Special Requests   Final    BOTTLES DRAWN AEROBIC AND ANAEROBIC Blood Culture adequate volume Performed at [facility] [address] [place], [place] [address]    Culture   Final    NO GROWTH 4 DAYS Performed at [facility], [place] [address] [place], [place] [address]    Report Status PENDING  Incomplete  Culture, blood (Routine X 2) w Reflex to ID Panel     Status: None (Preliminary result)   Collection Time: 09/24/18 11:03 PM  Result Value Ref Range Status   Specimen Description BLOOD RIGHT HAND  Final   Special Requests   Final    BOTTLES DRAWN AEROBIC AND ANAEROBIC Blood Culture adequate volume   Culture   Final    NO GROWTH 4 DAYS Performed at [facility], [place] [address], [place], [place] [address]    Report Status PENDING  Incomplete  MRSA PCR Screening     Status: None   Collection Time: 09/27/18  9:44 AM  Result Value Ref Range Status   MRSA by PCR NEGATIVE NEGATIVE Final    Comment:        The GeneXpert MRSA Assay (FDA approved for NASAL specimens only), is one component of a comprehensive MRSA  colonization surveillance program. It is not intended to diagnose MRSA infection nor to guide or monitor treatment for MRSA infections. Performed at [facility], [place] [address], [place], [place] [address]          Radiology Studies: Dg Abd 1 View  Result Date: 09/28/2018 CLINICAL DATA:  Ileus EXAM: ABDOMEN - 1 VIEW COMPARISON:  09/27/2018 FINDINGS: Nonobstructive bowel gas pattern. Mild residual contrast within ascending and transverse colon. No colonic dilatation. Degenerative changes of the lumbar spine. IMPRESSION: Unremarkable abdominal radiograph. Electronically Signed   By: Julian Hy M.D.   On: 09/28/2018 05:08   Dg Abd 1 View  Result Date: 09/27/2018 CLINICAL DATA:  Encounter for feeding tube placement. EXAM: ABDOMEN - 1 VIEW COMPARISON:  Radiograph of same day.  CT scan of June 22, 2018. FINDINGS: No small bowel dilatation is noted. Residual contrast is seen in the colon. Colonic dilatation is noted suggesting possible ileus. No abnormal calcifications are noted. IMPRESSION: Colonic dilatation is noted in left side of abdomen concerning for possible ileus. Electronically Signed   By: Marijo Conception, M.D.   On: 09/27/2018 12:21   Dg Chest Port 1 View  Result Date: 09/27/2018 CLINICAL DATA:  Acute respiratory failure. EXAM: PORTABLE CHEST 1 VIEW COMPARISON:  Chest x-ray dated September 25, 2018. FINDINGS: The patient is rotated to the left, limiting evaluation. Stable cardiomegaly. Unchanged mild interstitial edema, small bilateral pleural effusions, and bibasilar atelectasis. Apparent increased density at the right lung base is likely due to rotation. No pneumothorax. No acute osseous abnormality. IMPRESSION: 1. Unchanged mild interstitial edema, small bilateral pleural effusions, and bibasilar atelectasis. Electronically Signed   By: Titus Dubin M.D.   On: 09/27/2018 10:33        Scheduled Meds: . budesonide (PULMICORT) nebulizer solution  0.25  mg Nebulization BID  . chlorhexidine  15 mL Mouth Rinse BID  . enoxaparin (LOVENOX) injection  30 mg Subcutaneous Q24H  . furosemide  80 mg Intravenous BID  . haloperidol lactate  0.5 mg Intravenous Q8H  . ipratropium-albuterol  3 mL Nebulization BID  . levothyroxine  37.5 mcg Intravenous Daily  . mouth rinse  15 mL Mouth Rinse q12n4p  . metoprolol tartrate  5 mg Intravenous Q6H   Continuous Infusions: . ceFEPime (MAXIPIME) IV Stopped (09/28/18 1828)  . dextrose 5 % and 0.9% NaCl Stopped (09/28/18 1758)     LOS: 5 days    Time spent: 35 min.    Reyne Dumas, MD  [facility]    If 7PM-7AM, please contact night-coverage [email or web address] Password [facility] 09/29/2018, 7:47 AM

## 2018-09-29 NOTE — Progress Notes (Signed)
PT Cancellation Note  Patient Details Name: Katie Mosley MRN: [id number] DOB: 09-Jun-1941   Cancelled Treatment:    Reason Eval/Treat Not Completed: Medical issues which prohibited therapy;Fatigue/lethargy limiting ability to participate   Claretha Cooper 09/29/2018, 11:29 AM [place] Pager [phone] Office [phone]

## 2018-09-29 NOTE — Progress Notes (Signed)
   09/28/18 2300  Vitals  Temp 98.1 F (36.7 C)  BP (!) 151/49  MAP (mmHg) 83  Pulse Rate 81  ECG Heart Rate 81  Resp (!) 23  Oxygen Therapy  SpO2 96 %   Bare hugger removed per family request, education provided regarding tempeture.

## 2018-09-29 NOTE — Procedures (Signed)
  [place] A. Merlene Laughter, MD     [email or web address]           HISTORY: This is a 78 year old who presents with acute encephalopathy and unresponsiveness.  The studies been done to evaluate for seizure as an etiology.  MEDICATIONS: Scheduled Meds: Continuous Infusions: PRN Meds:.  Prior to Admission medications   Medication Sig Start Date End Date Taking? Authorizing Provider  acetaminophen (TYLENOL) 325 MG tablet Take 325 mg by mouth every 6 (six) hours as needed for moderate pain.     [provider]  albuterol (PROVENTIL HFA;VENTOLIN HFA) 108 (90 Base) MCG/ACT inhaler Inhale 2 puffs into the lungs. Every 4 to 6 hours as needed for shortness of breath and wheezing 09/07/18   [provider]  aspirin EC 81 MG tablet Take 81 mg by mouth daily.    [provider]  docusate sodium (COLACE) 100 MG capsule Take 1 capsule (100 mg total) by mouth every 12 (twelve) hours. 10/31/16   Isla Pence, MD  furosemide (LASIX) 40 MG tablet Take 40 mg by mouth daily. 08/05/18   [provider]  HYDROcodone-acetaminophen (NORCO/VICODIN) 5-325 MG tablet Take 1 tablet by mouth every 4 (four) hours as needed. Patient taking differently: Take 1 tablet by mouth every 4 (four) hours as needed for moderate pain.  10/31/16   Isla Pence, MD  Iron-FA-B Cmp-C-Biot-Probiotic (FUSION PLUS) CAPS Take 1 tablet by mouth daily.    [provider]  levothyroxine (SYNTHROID, LEVOTHROID) 50 MCG tablet Take 1 tablet (50 mcg total) by mouth daily before breakfast. 02/18/15   Velvet Bathe, MD  metoprolol succinate (TOPROL-XL) 50 MG 24 hr tablet Take 50 mg by mouth daily. 09/05/14   [provider]  pantoprazole (PROTONIX) 40 MG tablet Take 1 tablet (40 mg total) by mouth daily. 11/05/15   Ghimire, Henreitta Leber, MD      ANALYSIS: A 16 channel recording using standard 10 20 measurements is conducted for 21 minutes.  Background activity gets as high as 6  Hz.  However, it is also noted to have delta activity of 3 to 4 Hz from time to time.  There is episodes of triphasic waves seen throughout the recording.  Photic stimulation and hyperventilation are not conducted.  There is no focal or lateral slowing.  There is no epileptiform activity is observed.   IMPRESSION: This recording is abnormal for the following reasons: 1.  Moderate to severe global slowing indicating a moderate to severe global encephalopathy. 2. Triphasic waves which are typically seen in encephalopathies due to hepatic or renal impairment.      Sashay Felling A. Merlene Laughter, M.D.  Diplomate, Tax adviser of Psychiatry and Neurology ( Neurology).

## 2018-09-29 NOTE — Progress Notes (Signed)
Patient ID: ETRULIA ZARR, female   DOB: 05-31-1941, 78 y.o.   MRN: [id number]  This NP visited patient at the bedside as a follow up for palliative medicine needs and emotional support. Son at bedside and  I introduced myself as the palliative provider that will be following this patient over the next 4 days.  Created space and opportunity to explore his thoughts and feelings regarding current medical situation of the patient/his mother.  Family are open to all offered and available medical interventions to prolong life and are hopeful for improvement.  Son speaks to his mother's fully independent with no cognitive deficits state of well-being prior to this hospitalization.  Discussed with family  the importance of continued conversation with his family and the medical providers regarding overall plan of care and treatment options,  ensuring decisions are within the context of the patients values and GOCs.  Questions and concerns addressed   Discussed with Dr Allyson Sabal  Total time spent on the unit was 15 minutes     PMT will continue to support holistically  Greater than 50% of the time was spent in counseling and coordination of care  Wadie Lessen NP  Palliative Medicine Team Team Phone # [phone] Pager [phone]

## 2018-09-29 NOTE — Progress Notes (Signed)
EEG Completed; Results Pending  

## 2018-09-29 NOTE — Care Management Note (Signed)
Case Management Note  Patient Details  Name: LIAHNA BRICKNER MRN: [id number] Date of Birth: 1940/12/21  Subjective/Objective:                  Discharge Readiness Return to top of Delirium RRG - [place]  Discharge readiness is indicated by patient meeting Recovery Milestones, including ALL of the following: ? Delirium resolved or manageable[R] NO ? No dangerous behavior[S] for at least 24 hours YES ? Thoughts of suicide or Harm absent or manageable at lower level of care  ? Medical comorbidities, adverse medication events, and substance use absent or treatable at lower level of care YES ? Functional status impairment absent or adequate self-care support planned at lower level of care NO ? Patient can participate (eg, verify absence of plan for harm) in needed monitoring NO Following for progression of care. Following for cm needs none present at this time.   Action/Plan:   Expected Discharge Date:  (unknown)               Expected Discharge Plan:     In-House Referral:     Discharge planning Services     Post Acute Care Choice:    Choice offered to:     DME Arranged:    DME Agency:     HH Arranged:    HH Agency:     Status of Service:     If discussed at [employer] of Stay Meetings, dates discussed:    Additional Comments:  Leeroy Cha, RN 09/29/2018, 10:25 AM

## 2018-09-29 NOTE — Progress Notes (Signed)
SLP Cancellation Note  Patient Details Name: Katie Mosley MRN: [id number] DOB: 12-13-1940   Cancelled treatment:       Reason Eval/Treat Not Completed: (pt at MRI at this time)   Macario Golds 09/29/2018, 4:58 PM    Luanna Salk, [place] [facility] SLP [place] Pager [phone] Office [phone]

## 2018-09-30 ENCOUNTER — Inpatient Hospital Stay (HOSPITAL_COMMUNITY): Payer: Medicare Other

## 2018-09-30 ENCOUNTER — Inpatient Hospital Stay (HOSPITAL_COMMUNITY)
Admit: 2018-09-30 | Discharge: 2018-09-30 | Disposition: A | Discharge status: Home / Self Care | Payer: Medicare Other | Attending: Neurology | Admitting: Neurology

## 2018-09-30 DIAGNOSIS — R509 Fever, unspecified: Secondary | ICD-10-CM

## 2018-09-30 DIAGNOSIS — E871 Hypo-osmolality and hyponatremia: Secondary | ICD-10-CM

## 2018-09-30 LAB — COMPREHENSIVE METABOLIC PANEL
ALBUMIN: 2.4 g/dL — AB (ref 3.5–5.0)
ALT: 25 U/L (ref 0–44)
ANION GAP: 11 (ref 5–15)
AST: 45 U/L — ABNORMAL HIGH (ref 15–41)
Alkaline Phosphatase: 59 U/L (ref 38–126)
BUN: 62 mg/dL — AB (ref 8–23)
CO2: 17 mmol/L — ABNORMAL LOW (ref 22–32)
Calcium: 8.6 mg/dL — ABNORMAL LOW (ref 8.9–10.3)
Chloride: 120 mmol/L — ABNORMAL HIGH (ref 98–111)
Creatinine, Ser: 2.55 mg/dL — ABNORMAL HIGH (ref 0.44–1.00)
GFR calc Af Amer: 20 mL/min — ABNORMAL LOW (ref >60)
GFR calc non Af Amer: 17 mL/min — ABNORMAL LOW (ref >60)
GLUCOSE: 100 mg/dL — AB (ref 70–99)
Potassium: 3.7 mmol/L (ref 3.5–5.1)
Sodium: 148 mmol/L — ABNORMAL HIGH (ref 135–145)
Total Bilirubin: 1.4 mg/dL — ABNORMAL HIGH (ref 0.3–1.2)
Total Protein: 6.1 g/dL — ABNORMAL LOW (ref 6.5–8.1)

## 2018-09-30 LAB — CULTURE, BLOOD (ROUTINE X 2)
Culture: NO GROWTH
Culture: NO GROWTH
SPECIAL REQUESTS: ADEQUATE
Special Requests: ADEQUATE

## 2018-09-30 LAB — RESPIRATORY PANEL BY PCR
ADENOVIRUS-RVPPCR: NOT DETECTED
Bordetella pertussis: NOT DETECTED
CORONAVIRUS NL63-RVPPCR: NOT DETECTED
Chlamydophila pneumoniae: NOT DETECTED
Coronavirus 229E: NOT DETECTED
Coronavirus HKU1: NOT DETECTED
Coronavirus OC43: NOT DETECTED
Influenza A: NOT DETECTED
Influenza B: NOT DETECTED
Metapneumovirus: NOT DETECTED
Mycoplasma pneumoniae: NOT DETECTED
PARAINFLUENZA VIRUS 4-RVPPCR: NOT DETECTED
Parainfluenza Virus 1: NOT DETECTED
Parainfluenza Virus 2: NOT DETECTED
Parainfluenza Virus 3: NOT DETECTED
Respiratory Syncytial Virus: NOT DETECTED
Rhinovirus / Enterovirus: NOT DETECTED

## 2018-09-30 MED ORDER — CLONIDINE HCL 0.3 MG/24HR TD PTWK
0.3000 mg | MEDICATED_PATCH | TRANSDERMAL | Status: DC
  Administered 2018-09-30 – 2018-10-14 (×3): 0.3 mg via TRANSDERMAL
  Filled 2018-09-30 (×3): qty 1

## 2018-09-30 MED ORDER — SODIUM CHLORIDE 0.9 % IV SOLN
2.0000 g | Freq: Four times a day (QID) | INTRAVENOUS | Status: DC
  Administered 2018-09-30 – 2018-10-02 (×8): 2 g via INTRAVENOUS
  Filled 2018-09-30 (×2): qty 2000
  Filled 2018-09-30: qty 2
  Filled 2018-09-30: qty 2000
  Filled 2018-09-30: qty 2
  Filled 2018-09-30 (×5): qty 2000
  Filled 2018-09-30: qty 2

## 2018-09-30 MED ORDER — VANCOMYCIN VARIABLE DOSE PER UNSTABLE RENAL FUNCTION (PHARMACIST DOSING): Status: DC

## 2018-09-30 MED ORDER — VANCOMYCIN HCL IN DEXTROSE 1-5 GM/200ML-% IV SOLN
1000.0000 mg | Freq: Once | INTRAVENOUS | Status: AC
  Administered 2018-09-30: 1000 mg via INTRAVENOUS
  Filled 2018-09-30: qty 200

## 2018-09-30 MED ORDER — SODIUM CHLORIDE 0.45 % IV SOLN: INTRAVENOUS | Status: DC

## 2018-09-30 MED ORDER — SODIUM CHLORIDE 0.45 % IV SOLN
INTRAVENOUS | Status: AC
  Administered 2018-09-30 (×2): 75 mL/h via INTRAVENOUS
  Administered 2018-10-01: 04:00:00 via INTRAVENOUS

## 2018-09-30 MED ORDER — DEXTROSE 5 % IV SOLN
500.0000 mg | INTRAVENOUS | Status: DC
  Administered 2018-09-30 – 2018-10-01 (×2): 500 mg via INTRAVENOUS
  Filled 2018-09-30 (×3): qty 10

## 2018-09-30 MED ORDER — SODIUM CHLORIDE 0.9 % IV SOLN
2.0000 g | Freq: Two times a day (BID) | INTRAVENOUS | Status: DC
  Administered 2018-09-30 – 2018-10-02 (×4): 2 g via INTRAVENOUS
  Filled 2018-09-30: qty 2
  Filled 2018-09-30: qty 20
  Filled 2018-09-30: qty 2
  Filled 2018-09-30 (×2): qty 20

## 2018-09-30 NOTE — Progress Notes (Signed)
Physical Therapy Treatment Patient Details Name: Katie Mosley MRN: [id number] DOB: 09/28/1940 Today's Date: 09/30/2018    History of Present Illness 78 yo female admitted to ED on 1/28 for weakness and fatigue, with medical diagnosis of sepsis with possible PNA. PMH includes adrenal insufficiency, hypothyroid, CHF, HTN, hypothyroidism, dyspnea, CKDIII.     PT Comments    Pt presents with a decline in cognitive status and in mobility. She is alert but unresponsive to commands. Her son was present and provided interpretation, she did not respond to him. +2 total assist for supine to sit, pt unable to maintain sitting balance at edge of bed due to strong trunk extensor tone requiring max assist to maintain trunk in neutral. +2 total assist for sit to supine. Noted pt postures BUEs (L more than R) in flexed position at rest. Pt not able to actively participate in PT at present.     Follow Up Recommendations  SNF     Equipment Recommendations  None recommended by PT    Recommendations for Other Services       Precautions / Restrictions Precautions Precautions: Fall Precaution Comments: pt is [place], son translates Restrictions Weight Bearing Restrictions: No    Mobility  Bed Mobility Overal bed mobility: Needs Assistance Bed Mobility: Supine to Sit;Sit to Supine     Supine to sit: Total assist;+2 for physical assistance Sit to supine: Total assist;+2 for physical assistance   General bed mobility comments: assisted pt supine to sit, pt had very strong trunk extensor tone requiring max assist to maintain trunk in neutral, +2 assist to return to supine as pt was unsafe to sit edge of bed 2* poor balance; noted LUE in flexed posture at rest (elbow flexed, hand in fist, shoulder internally rotated)  Transfers                 General transfer comment: unable 2* strong trunk extensor tone in sitting  Ambulation/Gait                 Stairs              Wheelchair Mobility    Modified Rankin (Stroke Patients Only)       Balance   Sitting-balance support: Bilateral upper extremity supported;Feet supported Sitting balance-Leahy Scale: Zero Sitting balance - Comments: strong trunk extensor tone requiring max assist Postural control: Posterior lean                                  Cognition Arousal/Alertness: Awake/alert Behavior During Therapy: Flat affect Overall Cognitive Status: Impaired/Different from baseline Area of Impairment: Orientation;Attention;Memory;Safety/judgement;Following commands;Problem solving;Awareness                 Orientation Level: Person;Place;Time;Situation   Memory: Decreased short-term memory Following Commands: Follows one step commands inconsistently Safety/Judgement: Decreased awareness of safety;Decreased awareness of deficits   Problem Solving: Requires verbal cues;Requires tactile cues;Difficulty sequencing;Decreased initiation;Slow processing General Comments: pt not responsive to commands, eyes are open but she is not communicative, son present and provided interpretation, she did not respond to him verbally      Exercises      General Comments        Pertinent Vitals/Pain Faces Pain Scale: No hurt    Home Living                      Prior Function  PT Goals (current goals can now be found in the care plan section) Acute Rehab PT Goals Patient Stated Goal: to go home  PT Goal Formulation: With family Time For Goal Achievement: 10/09/18 Potential to Achieve Goals: Fair Progress towards PT goals: Not progressing toward goals - comment(cognitive decline, extensor tone in sitting)    Frequency    Min 2X/week      PT Plan Current plan remains appropriate    Co-evaluation              AM-PAC PT "6 Clicks" Mobility   Outcome Measure  Help needed turning from your back to your side while in a flat bed without using  bedrails?: Total Help needed moving from lying on your back to sitting on the side of a flat bed without using bedrails?: Total Help needed moving to and from a bed to a chair (including a wheelchair)?: Total Help needed standing up from a chair using your arms (e.g., wheelchair or bedside chair)?: Total Help needed to walk in hospital room?: Total Help needed climbing 3-5 steps with a railing? : Total 6 Click Score: 6    End of Session   Activity Tolerance: Treatment limited secondary to medical complications (Comment)(pt unable to participate 2* poor cognition, strong extensor tone in sitting) Patient left: in bed;with call bell/phone within reach;with family/visitor present;with bed alarm set Nurse Communication: Mobility status PT Visit Diagnosis: Other abnormalities of gait and mobility (R26.89);Difficulty in walking, not elsewhere classified (R26.2)     Time: 0037-0488 PT Time Calculation (min) (ACUTE ONLY): 14 min  Charges:  $Therapeutic Activity: 8-22 mins                    Blondell Reveal Kistler PT 09/30/2018  Acute Rehabilitation Services Pager [phone] Office [phone]

## 2018-09-30 NOTE — Progress Notes (Signed)
Called report to [facility] on [address], [place] notified.

## 2018-09-30 NOTE — Plan of Care (Signed)
Discussed with EEG staff, in case pt not able to be transferred today, we will do a one-hour long EEG today. Pt can be hooked up in the morning if pt transferred overnight to [facility].   Rosalin Hawking, MD PhD Stroke Neurology 09/30/2018 2:38 PM

## 2018-09-30 NOTE — Progress Notes (Signed)
Initial Nutrition Assessment  DOCUMENTATION CODES:   (Will assess for malnutrition at follow-up.)  INTERVENTION:  - if Cortrak/small bore NGT placed, recommend Osmolite 1.2 @ 35 ml/hr to advance by 10 ml every 4 hours to reach goal rate of 55 ml/hr. - goal rate will provide 1584 kcal, 73 grams of protein, and 1082 ml free water. - will continue to monitor for nutrition-related plan and needs.    NUTRITION DIAGNOSIS:   Inadequate oral intake related to inability to eat as evidenced by NPO status.  GOAL:   Patient will meet greater than or equal to 90% of their needs  MONITOR:   Weight trends, Labs, I & O's, Other (Comment)(plan for route of nutrition (oral vs enteral))  REASON FOR ASSESSMENT:   Consult Assessment of nutrition requirement/status, Enteral/tube feeding initiation and management  ASSESSMENT:   78 y.o. female with history of CHF with grade 1 diastolic dysfunction, chronic hyponatremia, CKD stage III, chronic anemia, hypothyroidism, and HTN. She presented to the ED with complaints of fatigue and SOB for the past 1 week. Patient also noticed increasing swelling in the lower extremities. Has had some productive cough.  Patient also has been feeling weak and fatigued easily.  BMI indicates normal weight. No family/visitors present at this time. RN at bedside and reports that patient is able to speak English/some English but has been non-verbal the past 2 days. Unable to obtain any nutrition-related hx.   RN reports plan for transfer to [facility] for continuous EEG. She reports an order was previously in place for Cortrak/small bore NGT but has since been d/c'ed and it is uncertain what the plan concerning nutrition is at this time. SLP attempted to see patient yesterday but was unable as patient was in MRI.   Current order for FLD, nectar-thick liquids but patient has not had anything today. Per review of flow sheet, patient consumed 0% of breakfast, 50% of lunch, and 75% of  dinner on this diet 1/30 and 0% of all meals on 1/31. No other documentation since 1/31.   Per chart review, current weight is 115 lb and weight on 10/15/17 was 110 lb. No other weights recorded over the past 1 year. NFPE outlined below but re-doing it may be warranted as patient was slightly agitated during NFPE and findings may not be fully reflective.    Medications reviewed; 37.5 mcg IV synthroid/day. Labs reviewed; Na: 148 mmol/l, Cl: 129 mmol/l, BUN: 62 mg/dl, creatinine: 2.55 mg/dl, Ca: 8.6 mg/dl, GFR: 17 ml/min.  IVF; 1/2 NS @ 75 ml/hr.     NUTRITION - FOCUSED PHYSICAL EXAM:    Most Recent Value  Orbital Region  No depletion  Upper Arm Region  Mild depletion  Thoracic and Lumbar Region  Unable to assess  Buccal Region  Unable to assess  Temple Region  No depletion  Clavicle Bone Region  Mild depletion  Clavicle and Acromion Bone Region  Mild depletion  Scapular Bone Region  Unable to assess  Dorsal Hand  No depletion  Patellar Region  No depletion  Anterior Thigh Region  No depletion  Posterior Calf Region  Mild depletion  Edema (RD Assessment)  Mild [all extremities]  Hair  Reviewed  Eyes  Reviewed  Mouth  Unable to assess  Skin  Reviewed  Nails  Reviewed       Diet Order:   Diet Order            Diet full liquid Room service appropriate? Yes; Fluid consistency: Nectar Thick  Diet effective now              EDUCATION NEEDS:   Not appropriate for education at this time  Skin:  Skin Assessment: Reviewed RN Assessment  Last BM:  2/1  Height:   Ht Readings from Last 1 Encounters:  09/23/18 4\' 11"  (1.499 m)    Weight:   Wt Readings from Last 1 Encounters:  09/28/18 52 kg    Ideal Body Weight:  42.91 kg  BMI:  Body mass index is 23.15 kg/m.  Estimated Nutritional Needs:   Kcal:  1560-1820 kcal  Protein:  65-75 grams  Fluid:  >/= 1.6 L/day     Jarome Matin, MS, RD, LDN, [facility] Inpatient Clinical Dietitian Pager # [phone] After  hours/weekend pager # [phone]

## 2018-09-30 NOTE — Progress Notes (Signed)
Prolonged EEG completed; Dr Merlene Laughter notified.

## 2018-09-30 NOTE — Progress Notes (Addendum)
PROGRESS NOTE    YUMALAY CIRCLE  RDE:[id number] DOB: [birth year] DOA: 09/23/2018 PCP: Nolene Ebbs, MD   Brief Narrative:  Katie Mosley a 78 y.o.femalewithhistory of diastolic CHF last EF measured in March 2017 was 60 to 65% with grade 1 diastolic dysfunction, chronic hyponatremia, chronic kidney disease stage III, chronic anemia, hypothyroidism hypertension presents to the ER with complaints of having fatigue and shortness of breath for the last 1 week. Patient also noticed increasing swelling in the lower extremities.  Has had some productive cough. Patient also has been feeling weak and fatigued easily. Has not had any dizziness fall or loss of consciousness.   ED Course:In the ER patient EKG shows sinus bradycardia with a heart rate around 54 bpm. Nonspecific ST-T changes. Labs reveal sodium of 123 which is markedly low from her previous. On exam patient has bilateral crepitations with lower extremity edema. Chest x-ray shows peripheral congestion. Patient's hemoglobin is at baseline. But does have some leukopenia. Patient is afebrile. Initially patient was given fluids in the ER by ER physician but given that patient has been likely CHF, now have  discontinued the fluids and start patient on Lasix.  09/27/2018:   Patient   transferred to ICU.patient's mental status continues to wax and wane. Patient is currently awake but nonverbal, confused, unable to also questions. Family's is always by the bedside. All questions and concerns have been addressed. Patient continues to decline despite extensive testing and interventions. Prognosis remains guarded.Neurology consulted as last resort ,recommed LTM EEG, and LP, transfer to [facility] per neuro recommendations      Assessment & Plan:   Principal Problem:   Sepsis ([place]) Active Problems:   Hyponatremia   Hypothyroidism   Hypothermia   Acute encephalopathy   Normocytic anemia   Leukopenia   CKD (chronic kidney disease),  stage IV ([place])   Palliative care encounter   Acute respiratory failure ([place])   Acute encephalopathy, probably combined toxic and metabolic encephalopathy, now with jerking movements: Patient presenting with altered mental status progressive weakness from home.  Etiology likely multifactorial with underlying sepsis/pneumonia versus hyponatremia, versus adult failure to thrive.  Patient was also noted to be hypothermic on admission.  Chest x-ray notable for right-sided infiltrates.  Urinalysis unrevealing. Blood cultures  No growth so far --Continue with  Nectar thick liquid as per speech therapy recommendations although the patient is not consuming much at all. completed treatment for pneumonia with IV antibiotics.  pro-calcitonin 2/3 was 0.36 --respiratory viral panel is pending, [ordered because of persistent low-grade fever] --Palliative care following, appreciate assistance, however family not prepared to make any decisions yet 09/27/2018: patient was transferred to ICU/stepdown unit due to altered mental status.    Ammonia 19 , lactic acid 1.2 , chest x-ray 09/27/18 shows increased density in the right lung, abdominal x-ray concerning for possible ileus, cardiac BNP trending up, now 2549 , could be secondary to worsening renal function, ABG within normal limits.  Discussed CODE STATUS with the patient's son, and family want patient to be full code for now.  Further management will depend on hospital course. Given dysarthria,aphasia, son is questioning whether stroke needs to be ruled out, MRI of the brain was negative for stroke EEG showed moderate to severe global slowing , consistent with severe global encephalopathy Patient recently also given cefepime, Reglan, Haldol which could have contributed to her mental status being altered. All of these medications have been discontinued Neurology Dr.xu has been consulted , consulted as last resort ,recommed  LTM EEG, and LP, transfer to [facility] per neuro  recommendations .Lovenox held for possible LP by IR , as patient is having tremors, and it would be high risk to do it bedside      Pneumonia, suspect gram-negative organism  CXR from 1/29 with right-sided haziness consistent with likely infiltrate. --Afebrile, without leukocytosis --patient has been on antibiotics since 1/29, initially meropenem, then cefepime 2 g IV every 24 hours DC 'd antibiotics 2/3  as patient has completed 5 day course and negative pro-calcitonin, respiratory panel pending, Suspect chronic   aspiration as per speech therapy evaluation noted on 1/30  Acute on chronic diastolic congestive heart failure: -discontinued  IV Lasix due to rising  creatinine/sodium   -patient appears euvolemic,, holding Lasix 40 twice a day by mouth - continue IV metoprolol    Follow renal function closely Instead Patient started on gentle IV fluids with half normal saline , patient is currently on room air and does not appear to be volume overloaded  Hypothyroidism Patient is on levothyroxine 50 mcg p.o. daily at home.  Unclear if she was actually taking this as prescribed.  TSH on admission elevated at 9.  Free T4 normal at 1.39. repeat TSH is 5.717 --Continue IV Synthroid at 37.5mg      Hyponatremia, resolved Patient  presented with a sodium of 127.  Etiology includes volume depletion/dehydration versus SIADH versus adrenal insufficiency versus hypothyroidism.  TSH elevated at 9.0 with a normal free T4.  Urine sodium 75, urine osmolality 323.  Cortisol level normal.   Sodium is now increased to 148 after diuresis. Lasix now placed on hold  Chronic dysphagia Currently speech therapy recommends nectar thick liquids, GI evaluation but the patient is not medically stable for invasive procedures. She continues to have poor oral intake. Yesterday family mentioned initiating tube feeding. Will place cortrak  and consider initiating TF , if family desires   Anemia Etiology likely of chronic  disease.  Normal MCV.  Iron is 144 with a TIBC of 358, ferritin 139, folate 38. --Hemoglobin down to 6.7  ,Status post 1 unit of packed red blood cells, hemoglobin currently stable at 9.4> 8.6>8.2>8.2 .  check FOBT, may benefit from EGD due to chronic dysphagia ,when stable  CKD stage III Creatinine continues to uptrend.  Baseline creatinine between 1.3-1.6. creatinine has been trending up, now 2.21>2.5 .  Bladder scan without any significant retained urine.  Etiology likely prerenal with dehydration/diuresis versus ATN   -discontinued IV Lasix and  Started on half normal saline Trend creatinine   Hypertensive urgency On admission, patient was noted to have significant elevated blood pressures and was started on a clonidine patch.  subsequently became hypotensive and her clonidine patch was removed.now resumed as the blood pressures trending up again    DVT prophylaxis: Lovenox Code Status: Full code Family Communication: Discussed with son extensively at bedside today. Disposition Plan: This will depend on hospital course. Consultants:   Palliative care  Procedures:   TTE 09/25/2018: EF 92-42%, diastolic dysfunction, mild TR/AS, mild Pulm HTN  U/S duplex Bilateral lower extremity: negative for DVT   Antimicrobials:  Anti-infectives (From admission, onward)   Start     Dose/Rate Route Frequency Ordered Stop   09/25/18 1800  ceFEPIme (MAXIPIME) 2 g in sodium chloride 0.9 % 100 mL IVPB  Status:  Discontinued     2 g 200 mL/hr over 30 Minutes Intravenous Every 24 hours 09/25/18 1239 09/29/18 0801   09/25/18 1230  ceFEPIme (MAXIPIME) 2 g  in sodium chloride 0.9 % 100 mL IVPB  Status:  Discontinued     2 g 200 mL/hr over 30 Minutes Intravenous Every 24 hours 09/25/18 1219 09/25/18 1238   09/25/18 0600  ceFEPIme (MAXIPIME) 2 g in sodium chloride 0.9 % 100 mL IVPB  Status:  Discontinued     2 g 200 mL/hr over 30 Minutes Intravenous Every 12 hours 09/24/18 1634 09/25/18 1219   09/24/18  1645  ceFEPIme (MAXIPIME) 2 g in sodium chloride 0.9 % 100 mL IVPB     2 g 200 mL/hr over 30 Minutes Intravenous STAT 09/24/18 1634 09/24/18 2000   09/24/18 1630  meropenem (MERREM) 1 g in sodium chloride 0.9 % 100 mL IVPB  Status:  Discontinued     1 g 200 mL/hr over 30 Minutes Intravenous Every 12 hours 09/24/18 1523 09/24/18 1630        Subjective: Patient seen alongside patient's son. Patient is less communicative today than she was yesterday. She was unable to also any questions, she is almost aphasic,according to the son, patient does respond to commands   Objective: Vitals:   09/30/18 0630 09/30/18 0638 09/30/18 0642 09/30/18 0700  BP:  (!) 201/41  (!) 196/46  Pulse: 69  72 65  Resp: 19  (!) 24 18  Temp:      TempSrc:      SpO2: 96%  98% 98%  Weight:      Height:       No intake or output data in the 24 hours ending 09/30/18 0736 Filed Weights   09/26/18 0526 09/27/18 0500 09/28/18 0500  Weight: 49.8 kg 52.4 kg 52 kg    Examination:  General exam: Not in any distress.  Patient is awake    Respiratory system: Clear to auscultation. Cardiovascular system: S1 & S2 heard.   Gastrointestinal system: Abdomen is nondistended, soft and nontender.  No palpable organs.   Central nervous system: Awake but nonverbal.  Patient moves all limbs.  Data Reviewed: I have personally reviewed following labs and imaging studies  CBC: Recent Labs  Lab 09/25/18 0552 09/26/18 0539 09/26/18 2239 09/27/18 0411 09/28/18 0342 09/29/18 0354  WBC 4.1 6.1  --  10.9* 7.1 4.8  NEUTROABS  --   --   --   --  5.5 3.5  HGB 7.6* 6.7* 9.4* 8.6* 8.2* 8.2*  HCT 22.3* 20.1* 28.1* 26.5* 25.1* 25.5*  MCV 91.4 92.6  --  95.3 95.8 97.3  PLT 170 171  --  157 142* 654   Basic Metabolic Panel: Recent Labs  Lab 09/24/18 0605  09/27/18 0411 09/27/18 1017 09/28/18 0342 09/29/18 0354 09/30/18 0258  NA 127*   < > 136  136 137 140  140 145 148*  K 4.2   < > 4.3  4.3 4.2 4.0  4.1 3.8 3.7  CL  96*   < > 111  111 113* 113*  113* 117* 120*  CO2 24   < > 17*  16* 17* 19*  19* 17* 17*  GLUCOSE 84   < > 160*  160* 118* 100*  102* 71 100*  BUN 23   < > 41*  42* 41* 45*  42* 48* 62*  CREATININE 1.00   < > 2.18*  2.20* 1.98* 1.98*  1.92* 2.21* 2.55*  CALCIUM 8.0*   < > 7.4*  7.4* 7.7* 8.0*  8.0* 8.4* 8.6*  MG 1.7  --  1.7  --  2.4 2.3  --   PHOS  --   --   --   --  5.0* 4.6  --    < > = values in this interval not displayed.   GFR: Estimated Creatinine Clearance: 13.4 mL/min (A) (by C-G formula based on SCr of 2.55 mg/dL (H)). Liver Function Tests: Recent Labs  Lab 09/24/18 0605 09/27/18 0411 09/28/18 0342 09/29/18 0354 09/30/18 0258  AST 52* 59*  --   --  45*  ALT 30 25  --   --  25  ALKPHOS 80 64  --   --  59  BILITOT 0.7 0.6  --   --  1.4*  PROT 7.1 5.9*  --   --  6.1*  ALBUMIN 3.0* 2.3* 2.5* 2.3* 2.4*   No results for input(s): LIPASE, AMYLASE in the last 168 hours. Recent Labs  Lab 09/27/18 0926  AMMONIA 19   Coagulation Profile: No results for input(s): INR, PROTIME in the last 168 hours. Cardiac Enzymes: Recent Labs  Lab 09/24/18 0605  TROPONINI 0.06*   BNP (last 3 results) No results for input(s): PROBNP in the last 8760 hours. HbA1C: No results for input(s): HGBA1C in the last 72 hours. CBG: Recent Labs  Lab 09/23/18 1927 09/24/18 1316 09/25/18 0019 09/27/18 0248 09/27/18 0904  GLUCAP 94 73 147* 175* 128*   Lipid Profile: No results for input(s): CHOL, HDL, LDLCALC, TRIG, CHOLHDL, LDLDIRECT in the last 72 hours. Thyroid Function Tests: Recent Labs    09/27/18 1249  TSH 5.717*  FREET4 2.00*   Anemia Panel: No results for input(s): VITAMINB12, FOLATE, FERRITIN, TIBC, IRON, RETICCTPCT in the last 72 hours. Sepsis Labs: Recent Labs  Lab 09/27/18 0926 09/27/18 1249 09/29/18 0354  PROCALCITON  --   --  0.36  LATICACIDVEN 1.2 1.1  --     Recent Results (from the past 240 hour(s))  Culture, blood (Routine X 2) w Reflex to ID  Panel     Status: None   Collection Time: 09/24/18  3:18 PM  Result Value Ref Range Status   Specimen Description   Final    BLOOD RIGHT ARM Performed at [place] [address], [place], [place] [address]    Special Requests   Final    BOTTLES DRAWN AEROBIC AND ANAEROBIC Blood Culture adequate volume Performed at [facility] [address], [place], [place] [address]    Culture   Final    NO GROWTH 5 DAYS Performed at [facility], [place] [address], [place], [place] [address]    Report Status 09/30/2018 FINAL  Final  Culture, blood (Routine X 2) w Reflex to ID Panel     Status: None   Collection Time: 09/24/18 11:03 PM  Result Value Ref Range Status   Specimen Description BLOOD RIGHT HAND  Final   Special Requests   Final    BOTTLES DRAWN AEROBIC AND ANAEROBIC Blood Culture adequate volume   Culture   Final    NO GROWTH 5 DAYS Performed at [facility], [place] [address], [place], [place] [address]    Report Status 09/30/2018 FINAL  Final  MRSA PCR Screening     Status: None   Collection Time: 09/27/18  9:44 AM  Result Value Ref Range Status   MRSA by PCR NEGATIVE NEGATIVE Final    Comment:        The GeneXpert MRSA Assay (FDA approved for NASAL specimens only), is one component of a comprehensive MRSA colonization surveillance program. It is not intended to diagnose MRSA infection nor to guide or monitor treatment  for MRSA infections. Performed at [facility], [place] [address], [place], [place] [address]          Radiology Studies: Mr Brain Wo Contrast  Result Date: 09/29/2018 CLINICAL DATA:  Acute encephalopathy. Jerking movements. Progressive weakness. EXAM: MRI HEAD WITHOUT CONTRAST TECHNIQUE: Multiplanar, multiecho pulse sequences of the brain and surrounding structures were obtained without intravenous contrast. COMPARISON:  Head CT 09/27/2018 and MRI 08/08/2015 FINDINGS: Brain: There is no  evidence of acute infarct, intracranial hemorrhage, mass, midline shift, or extra-axial fluid collection. Patchy to confluent T2 hyperintensities in the cerebral white matter are slightly progressed from the prior MRI and are nonspecific but compatible with moderate chronic small vessel ischemic disease. Mild T2 hyperintensity in the basal ganglia and thalami is similar to the prior MRI. 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 There is a small remote insult in the left cerebellum with associated calcification on CT. Vascular: Major intracranial vascular flow voids are preserved. Skull and upper cervical spine: Unremarkable bone marrow signal. Sinuses/Orbits: Bilateral cataract extraction. Clear paranasal sinuses. Trace mastoid effusions. Other: None. IMPRESSION: 1. No acute intracranial abnormality. 2. Moderate chronic small vessel ischemic disease and cerebral atrophy. Electronically Signed   By: Logan Bores M.D.   On: 09/29/2018 13:22        Scheduled Meds: . budesonide (PULMICORT) nebulizer solution  0.25 mg Nebulization BID  . chlorhexidine  15 mL Mouth Rinse BID  . cloNIDine  0.1 mg Transdermal Weekly  . enoxaparin (LOVENOX) injection  30 mg Subcutaneous Q24H  . furosemide  40 mg Oral BID  . ipratropium-albuterol  3 mL Nebulization BID  . levothyroxine  37.5 mcg Intravenous Daily  . mouth rinse  15 mL Mouth Rinse q12n4p  . metoprolol tartrate  5 mg Intravenous Q6H   Continuous Infusions: . dextrose 5 % and 0.9% NaCl Stopped (09/28/18 1758)     LOS: 6 days    Time spent: 35 min.    Reyne Dumas, MD  [facility]    If 7PM-7AM, please contact night-coverage [email or web address] Password [facility] 09/30/2018, 7:36 AM

## 2018-09-30 NOTE — Progress Notes (Signed)
Pharmacy Antibiotic Note  Katie Mosley is a 78 y.o. female admitted on 09/23/2018 with SOB and weakness, initially treated with Cefepime for sepsis, hypothermia, pancytopenia.  Pharmacy has now been consulted for vancomycin and acyclovir dosing for meningitis, ceftriaxone and ampicillin dosing per MD.  Today, 09/30/2018: SCr 2.55, acutely increasing WBC decreased to 4.8 Tm 100.4 Pending LP - scheduled for 2/5 because Lovenox was given 2/4 at 10am  Plan:  Ceftriaxone 2g IV q12h  Ampicillin 2g IV q6h  Acyclovir 500 mg IV q24h  - IV acyclovir is in short supply and is currently restricted to ID approval to ensure optimal usage  Vancomycin 1000 mg IV x1 dose.  Further dosing per pharmacy based on renal function.  Goal VT 15-20 with AUC <700.  Follow up renal fxn, culture results, and clinical course.   Height: 4\' 11"  (149.9 cm) Weight: 114 lb 10.2 oz (52 kg) IBW/kg (Calculated) : 43.2  Temp (24hrs), Avg:98.7 F (37.1 C), Min:97.8 F (36.6 C), Max:99.6 F (37.6 C)  Recent Labs  Lab 09/25/18 0552 09/26/18 0539 09/27/18 0411 09/27/18 0926 09/27/18 1017 09/27/18 1249 09/28/18 0342 09/29/18 0354 09/30/18 0258  WBC 4.1 6.1 10.9*  --   --   --  7.1 4.8  --   CREATININE 1.54* 2.51* 2.18*  2.20*  --  1.98*  --  1.98*  1.92* 2.21* 2.55*  LATICACIDVEN  --   --   --  1.2  --  1.1  --   --   --     Estimated Creatinine Clearance: 13.4 mL/min (A) (by C-G formula based on SCr of 2.55 mg/dL (H)).    No Known Allergies  Antimicrobials this admission:  1/29 cefepime >> 2/3 2/4 ceftriaxone >> 2/4 ampicillin >> 2/4 acyclovir >> 2/4 vancomycin >>   Dose adjustments this admission:  1/30 renally adjusted Cefepime  Microbiology results:  1/29 BCx x2: NGF 2/1 MRSA PCR: negative 2/3 Respiratory panel:  none detected 2/4 UCx: 2/4 BCx:  2/5 CSF:   Thank you for allowing pharmacy to be a part of this patient's care.  Gretta Arab PharmD, BCPS Pager [phone] 09/30/2018  1:39 PM

## 2018-09-30 NOTE — Progress Notes (Signed)
SLP Cancellation Note  Patient Details Name: JENEEN DOUTT MRN: [id number] DOB: [birth year]   Cancelled treatment:       Reason Eval/Treat Not Completed: Other (comment);Medical issues which prohibited therapy;Fatigue/lethargy limiting ability to participate(pt has been lethargic, unable to consume po last few days due to mentation but also has significant dysphagia, rec npo and SLP follow up)   Macario Golds 09/30/2018, 4:13 PM Luanna Salk, [facility] SLP [place] Pager [phone] Office [phone]

## 2018-09-30 NOTE — Consult Note (Signed)
Neurology Consultation Note  Consult Requested by: Dr. Allyson Sabal  Reason for Consult: AMS  Consult Date: 09/30/18  The history was obtained from the son and Dr. Allyson Sabal and chart.  During history and examination, all items were able to obtain unless otherwise noted.  History of Present Illness:  Katie Mosley is a 78 y.o. female with PMH of diastolic CHF, chronic hyponatremia, CKD stage III, hypertension, hypothyroidism admitted for lethargy, malaise, AMS, short of breath and cough.  Patient was found to have possible aspiration pneumonia and was started on antibiotics with meropenem and then cefepime.  Checks x-ray concerning for right-sided infiltrates.  Blood culture negative.  Repeat UA showed WBC> 50.  Patient also had low-grade fever on 09/29/2018 but no leukocytosis.  Antibiotics discontinued after 5-day course.  Patient mental status getting worse on 09/27/2018, she was transferred to ICU.  Initially, improved on 09/28/2018, but then worse again, and for the last 2 days patient totally altered, not following commands, not talking with intermittent jerking and shivering in all extremities. spot EEG resolving to showed triphasic wave indicating severe encephalopathy but no seizure.  CT showed no acute abnormality.  MRI no acute stroke or mass lesion, right chronic BG infarct.  Neurology consulted for AMS.  Patient also had chronic hyponatremia, on admission sodium 123, after supplementation sodium gradually getting better and today 148.  Renal function also deteriorating, on admission creatinine 1.0 and today 2.55.  1/2NS @ 75 started today for IV fluid.  Patient came in with anemia, hemoglobin 6.7, received blood transfusion, hemoglobin up to 9.4.  Today 8.2.  Primary team is working on stool guaiac and close monitoring.  As per son, patient baseline at home independent, able to do all activity except cooking.  Patient was able to walk without device, but slow.  Able to talk without difficulty.   Palliative care involved this time, and patient family would like to continue aggressive care.   Past Medical History:  Diagnosis Date  . Adrenal insufficiency ([place])   . Adrenal insufficiency ([place])    Dx'd [facility] 05/2015  . Asthma   . CHF (congestive heart failure) ([place])   . GERD (gastroesophageal reflux disease)   . Glaucoma   . Headache   . HTN (hypertension)   . Hypertension   . Hypothyroidism   . Normocytic anemia 09/07/2018  . Shortness of breath dyspnea   . Thyroid disease    not know in detail    Past Surgical History:  Procedure Laterality Date  . APPENDECTOMY    . BACK SURGERY    . CAPSULOTOMY  08/23/2011   Procedure: MINOR CAPSULOTOMY;  Surgeon: Myrtha Mantis.;  Location: [facility];  Service: Ophthalmology;  Laterality: Left;  YAG Capsulotomy Left Eye  . ESOPHAGOGASTRODUODENOSCOPY N/A 03/17/2013   Procedure: ESOPHAGOGASTRODUODENOSCOPY (EGD);  Surgeon: Irene Shipper, MD;  Location: [facility] ENDOSCOPY;  Service: Endoscopy;  Laterality: N/A;  . LEFT CATARACT EXTRACTION     2007  . RIGHT CATARACT EXTRACTION     2006    History reviewed. No pertinent family history.  Social History:  reports that she has never smoked. She has never used smokeless tobacco. She reports that she does not drink alcohol or use drugs.  Allergies: No Known Allergies  No current facility-administered medications on file prior to encounter.    Current Outpatient Medications on File Prior to Encounter  Medication Sig Dispense Refill  . acetaminophen (TYLENOL) 325 MG tablet Take 325 mg by mouth every 6 (six) hours  as needed for moderate pain.     Marland Kitchen albuterol (PROVENTIL HFA;VENTOLIN HFA) 108 (90 Base) MCG/ACT inhaler Inhale 2 puffs into the lungs. Every 4 to 6 hours as needed for shortness of breath and wheezing    . aspirin EC 81 MG tablet Take 81 mg by mouth daily.    Marland Kitchen docusate sodium (COLACE) 100 MG capsule Take 1 capsule (100 mg total) by mouth every 12 (twelve) hours. 60 capsule 0  .  furosemide (LASIX) 40 MG tablet Take 40 mg by mouth daily.    Marland Kitchen HYDROcodone-acetaminophen (NORCO/VICODIN) 5-325 MG tablet Take 1 tablet by mouth every 4 (four) hours as needed. (Patient taking differently: Take 1 tablet by mouth every 4 (four) hours as needed for moderate pain. ) 10 tablet 0  . Iron-FA-B Cmp-C-Biot-Probiotic (FUSION PLUS) CAPS Take 1 tablet by mouth daily.    Marland Kitchen levothyroxine (SYNTHROID, LEVOTHROID) 50 MCG tablet Take 1 tablet (50 mcg total) by mouth daily before breakfast. 30 tablet 0  . metoprolol succinate (TOPROL-XL) 50 MG 24 hr tablet Take 50 mg by mouth daily.  2  . pantoprazole (PROTONIX) 40 MG tablet Take 1 tablet (40 mg total) by mouth daily. 30 tablet 0    Review of Systems: A full ROS was attempted today and was not able to be performed due to altered mental status.  Physical Examination: Temp:  [97.8 F (36.6 C)-99.6 F (37.6 C)] 97.8 F (36.6 C) (02/04 1150) Pulse Rate:  [65-96] 77 (02/04 1200) Resp:  [17-26] 24 (02/04 1200) BP: (143-217)/(24-90) 158/40 (02/04 1200) SpO2:  [95 %-100 %] 98 % (02/04 1200)  General - cachectic, well developed, mildly agitated with stimulation, not following commands.    Ophthalmologic - fundi not visualized due to noncooperation.    Cardiovascular - regular rate and rhythm  Neuro - eyes open, not alert, not tracking, not following commands.  Nonverbal.  Seems to have right visual neglect, able to have brief eye contact on the left visual field but not on the right, inconsistently blinking to visual threat bilaterally.  Both eyes status post cataract surgery in the past, left eye pupil absent, right pupil of normal shape with diameter round 2.5 mm, no papillary reflex.  Facial symmetrical, tongue midline inside mouth, not corporative on tongue protrusion.  Not corporative on muscle strength testing, however, bilateral upper extremity symmetrical strength with gegenhalten pattern of paratonia.  Bilateral lower extremity 3-/5 on pain  stimulation. DTR 1+, no Babinski. Sensation, coordination and gait not tested.  Slight increased muscle tone in the neck, no significant meningismus.   Data Reviewed: Ct Head Wo Contrast  Result Date: 09/27/2018 CLINICAL DATA:  Neurological deficits with involuntary movements. EXAM: CT HEAD WITHOUT CONTRAST TECHNIQUE: Contiguous axial images were obtained from the base of the skull through the vertex without intravenous contrast. COMPARISON:  09/24/2018 FINDINGS: Brain: Diffuse cerebral atrophy. Ventricular dilatation consistent with central atrophy. Low-attenuation changes in the deep white matter consistent with small vessel ischemia. Old parenchymal calcifications in the basal ganglia and left cerebellum without change. No mass effect or midline shift. No abnormal extra-axial fluid collections. Gray-white matter junctions are distinct. Basal cisterns are not effaced. No acute intracranial hemorrhage. Vascular: Prominent intracranial arterial vascular calcifications are present. Skull: Calvarium appears intact. No acute displaced fractures identified. Sinuses/Orbits: Paranasal sinuses and mastoid air cells are clear. Other: No significant change since prior study. IMPRESSION: No acute intracranial abnormalities. Chronic atrophy and small vessel ischemic changes. Electronically Signed   By: Oren Beckmann.D.  On: 09/27/2018 03:56   Ct Head Wo Contrast  Result Date: 09/24/2018 CLINICAL DATA:  78 y/o  F; 3 days of weakness. EXAM: CT HEAD WITHOUT CONTRAST TECHNIQUE: Contiguous axial images were obtained from the base of the skull through the vertex without intravenous contrast. COMPARISON:  10/16/2017 CT head. FINDINGS: Brain: No evidence of acute infarction, hemorrhage, hydrocephalus, extra-axial collection or mass lesion/mass effect. Stable dystrophic calcifications in the basal ganglia and calcifications in the left cerebellar hemisphere. 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  Stable chronic microvascular ischemic changes and volume loss of the brain. Vascular: Calcific atherosclerosis of carotid siphons. No hyperdense vessel identified. Skull: Normal. Negative for fracture or focal lesion. Sinuses/Orbits: No acute finding. Other: Bilateral intra-ocular lens replacement. IMPRESSION: 1. No acute intracranial abnormality identified. 2. Stable chronic microvascular ischemic changes and volume loss of the brain. Stable small chronic lacunar infarct in the right caudate nucleus. Electronically Signed   By: Kristine Garbe M.D.   On: 09/24/2018 05:15   Mr Brain Wo Contrast  Result Date: 09/29/2018 CLINICAL DATA:  Acute encephalopathy. Jerking movements. Progressive weakness. EXAM: MRI HEAD WITHOUT CONTRAST TECHNIQUE: Multiplanar, multiecho pulse sequences of the brain and surrounding structures were obtained without intravenous contrast. COMPARISON:  Head CT 09/27/2018 and MRI 08/08/2015 FINDINGS: Brain: There is no evidence of acute infarct, intracranial hemorrhage, mass, midline shift, or extra-axial fluid collection. Patchy to confluent T2 hyperintensities in the cerebral white matter are slightly progressed from the prior MRI and are nonspecific but compatible with moderate chronic small vessel ischemic disease. Mild T2 hyperintensity in the basal ganglia and thalami is similar to the prior MRI. 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 There is a small remote insult in the left cerebellum with associated calcification on CT. Vascular: Major intracranial vascular flow voids are preserved. Skull and upper cervical spine: Unremarkable bone marrow signal. Sinuses/Orbits: Bilateral cataract extraction. Clear paranasal sinuses. Trace mastoid effusions. Other: None. IMPRESSION: 1. No acute intracranial abnormality. 2. Moderate chronic small vessel ischemic disease and cerebral atrophy. Electronically Signed   By: Logan Bores M.D.   On:  09/29/2018 13:22       Vas [place] Lower Extremity Venous (dvt)  Result Date: 09/24/2018  Lower Venous Study Indications: Edema.  Performing Technologist: Oliver Hum RVT  Examination Guidelines: A complete evaluation includes B-mode imaging, spectral Doppler, color Doppler, and power Doppler as needed of all accessible portions of each vessel. Bilateral testing is considered an integral part of a complete examination. Limited examinations for reoccurring indications may be performed as noted.  Right Venous Findings: +---------+---------------+---------+-----------+----------+-------+          CompressibilityPhasicitySpontaneityPropertiesSummary +---------+---------------+---------+-----------+----------+-------+ CFV      Full           Yes      Yes                          +---------+---------------+---------+-----------+----------+-------+ SFJ      Full                                                 +---------+---------------+---------+-----------+----------+-------+ FV Prox  Full                                                 +---------+---------------+---------+-----------+----------+-------+  FV Mid   Full                                                 +---------+---------------+---------+-----------+----------+-------+ FV DistalFull                                                 +---------+---------------+---------+-----------+----------+-------+ PFV      Full                                                 +---------+---------------+---------+-----------+----------+-------+ POP      Full           Yes      Yes                          +---------+---------------+---------+-----------+----------+-------+ PTV      Full                                                 +---------+---------------+---------+-----------+----------+-------+ PERO     Full                                                  +---------+---------------+---------+-----------+----------+-------+  Left Venous Findings: +---------+---------------+---------+-----------+----------+-------+          CompressibilityPhasicitySpontaneityPropertiesSummary +---------+---------------+---------+-----------+----------+-------+ CFV      Full           Yes      Yes                          +---------+---------------+---------+-----------+----------+-------+ SFJ      Full                                                 +---------+---------------+---------+-----------+----------+-------+ FV Prox  Full                                                 +---------+---------------+---------+-----------+----------+-------+ FV Mid   Full                                                 +---------+---------------+---------+-----------+----------+-------+ FV DistalFull                                                 +---------+---------------+---------+-----------+----------+-------+  PFV      Full                                                 +---------+---------------+---------+-----------+----------+-------+ POP      Full           Yes      Yes                          +---------+---------------+---------+-----------+----------+-------+ PTV      Full                                                 +---------+---------------+---------+-----------+----------+-------+ PERO     Full                                                 +---------+---------------+---------+-----------+----------+-------+    Summary: Right: There is no evidence of deep vein thrombosis in the lower extremity. No cystic structure found in the popliteal fossa. Left: There is no evidence of deep vein thrombosis in the lower extremity. No cystic structure found in the popliteal fossa.  *See table(s) above for measurements and observations. Electronically signed by Monica Martinez MD on 09/24/2018 at 3:52:06 PM.    Final     EEG  1.  Moderate to severe global slowing indicating a moderate to severe global encephalopathy. 2. Triphasic waves which are typically seen in encephalopathies due to hepatic or renal impairment.   Assessment: 78 y.o. female with PMH of diastolic CHF, chronic hyponatremia, CKD stage III, hypertension, hypothyroidism admitted for lethargy, malaise, AMS, short of breath and cough.  Observation, patient has been treated for possible aspiration pneumonia, hyponatremia with hypernatremia, AKI with CKD, low-grade fever, anemia. Finished 5-day Abx course. However, pt AMS continued to getting worse and now not following commands, nonverbal, agitation with all extremity jerking and shivering. CT/MRI unrevealing. EEG severe encephalopathy.     Pt does have AKI and hypernatremia, resolving low grade fever, but the AMS is out of proportion to these changes. Concerning for NCSE vs. Encephalitis. Pt received lovenox this am, not able to do LP. Will consider to perform in am. Will also need LTM EEG. Repeat Blood culture and urine culture. Will consider empiric treatment if needed. Will transfer [facility] to [facility].   Plan: - will perform LP in am - urine culture and blood culture - repeat ammonia and rule out hashimoto encephalopathy - repeat CXR - long term EEG - transfer to [facility] - continue 1/2NS for hydration - will consider empiric treatment with rocephin, vanco, ampicilin and acyclovir with renal dosing  - will follow    This patient is critically ill due to sepsis, AMS, low grade fever, jerking/shivering, anemia and at significant risk of neurological worsening, death form sepsis, septic shock, status epilepticus. This patient's care requires constant monitoring of vital signs, hemodynamics, respiratory and cardiac monitoring, review of multiple databases, neurological assessment, discussion with family, other specialists and medical decision making of high complexity. I spent 50 minutes of neurocritical  care time in the care of this patient.  Rosalin Hawking, MD PhD Stroke Neurology 09/30/2018 1:26 PM

## 2018-09-30 NOTE — Procedures (Signed)
  Crystal A. Merlene Laughter, MD     [email or web address]           HISTORY: The patient is 78 year old female who is confused and encephalopathic.  She is reaching for things and hallucinating.  Studies been done to evaluate for nonconvulsive seizure as an etiology.  She previously had a recording yesterday showing moderate global slowing with triphasic waves.  This is an extended study done to assess for seizure activity and a neurological function.  MEDICATIONS: Scheduled Meds: Continuous Infusions: PRN Meds:.  Prior to Admission medications   Medication Sig Start Date End Date Taking? Authorizing Provider  acetaminophen (TYLENOL) 325 MG tablet Take 325 mg by mouth every 6 (six) hours as needed for moderate pain.     [provider]  albuterol (PROVENTIL HFA;VENTOLIN HFA) 108 (90 Base) MCG/ACT inhaler Inhale 2 puffs into the lungs. Every 4 to 6 hours as needed for shortness of breath and wheezing 09/07/18   [provider]  aspirin EC 81 MG tablet Take 81 mg by mouth daily.    [provider]  docusate sodium (COLACE) 100 MG capsule Take 1 capsule (100 mg total) by mouth every 12 (twelve) hours. 10/31/16   Isla Pence, MD  furosemide (LASIX) 40 MG tablet Take 40 mg by mouth daily. 08/05/18   [provider]  HYDROcodone-acetaminophen (NORCO/VICODIN) 5-325 MG tablet Take 1 tablet by mouth every 4 (four) hours as needed. Patient taking differently: Take 1 tablet by mouth every 4 (four) hours as needed for moderate pain.  10/31/16   Isla Pence, MD  Iron-FA-B Cmp-C-Biot-Probiotic (FUSION PLUS) CAPS Take 1 tablet by mouth daily.    [provider]  levothyroxine (SYNTHROID, LEVOTHROID) 50 MCG tablet Take 1 tablet (50 mcg total) by mouth daily before breakfast. 02/18/15   Velvet Bathe, MD  metoprolol succinate (TOPROL-XL) 50 MG 24 hr tablet Take 50 mg by mouth daily. 09/05/14   [provider]  pantoprazole (PROTONIX) 40 MG  tablet Take 1 tablet (40 mg total) by mouth daily. 11/05/15   Ghimire, Henreitta Leber, MD      ANALYSIS: A 16 channel recording using standard 10 20 measurements is conducted for 61 minutes.  The background activity is that of mostly delta her range frequency of 4 Hz and sometimes 5 Hz.  There is beta activity superimposed in the frontal areas from time to time.  Photic stimulation and hyperventilation are not conducted.  There is frequent prominent vertex sharp wave activity with frontocentral maximum.  Focal or lateral slowing are not observed.  Clear epileptiform discharges are also not seen.  Infrequent triphasic waves are noted.   IMPRESSION: 1.  This prolonged EEG recording is abnormal showing moderate to severe global slowing indicating a moderate to severe global encephalopathy.  The picture is overall unchanged from previous study although there is much less triphasic wave.  There is prominent vertex sharp wave of unclear significance.  Vertex sharp waves are generally thought to be non-epileptic.      Heston Widener A. Merlene Laughter, M.D.  Diplomate, Tax adviser of Psychiatry and Neurology ( Neurology).

## 2018-09-30 NOTE — Progress Notes (Signed)
LTM EEG hookup can he done in the morning per Dr. Erlinda Hong Neurology

## 2018-10-01 ENCOUNTER — Inpatient Hospital Stay (HOSPITAL_COMMUNITY): Payer: Medicare Other

## 2018-10-01 DIAGNOSIS — R4182 Altered mental status, unspecified: Secondary | ICD-10-CM

## 2018-10-01 DIAGNOSIS — I5041 Acute combined systolic (congestive) and diastolic (congestive) heart failure: Secondary | ICD-10-CM

## 2018-10-01 DIAGNOSIS — G039 Meningitis, unspecified: Secondary | ICD-10-CM

## 2018-10-01 LAB — CSF CELL COUNT WITH DIFFERENTIAL
RBC Count, CSF: 1 /mm3 — ABNORMAL HIGH
Tube #: 3
WBC CSF: 1 /mm3 (ref 0–5)

## 2018-10-01 LAB — COMPREHENSIVE METABOLIC PANEL
ALT: 24 U/L (ref 0–44)
ANION GAP: 13 (ref 5–15)
AST: 43 U/L — ABNORMAL HIGH (ref 15–41)
Albumin: 2.1 g/dL — ABNORMAL LOW (ref 3.5–5.0)
Alkaline Phosphatase: 57 U/L (ref 38–126)
BUN: 70 mg/dL — ABNORMAL HIGH (ref 8–23)
CO2: 17 mmol/L — ABNORMAL LOW (ref 22–32)
Calcium: 8.5 mg/dL — ABNORMAL LOW (ref 8.9–10.3)
Chloride: 122 mmol/L — ABNORMAL HIGH (ref 98–111)
Creatinine, Ser: 2.74 mg/dL — ABNORMAL HIGH (ref 0.44–1.00)
GFR calc Af Amer: 18 mL/min — ABNORMAL LOW (ref >60)
GFR, EST NON AFRICAN AMERICAN: 16 mL/min — AB (ref >60)
Glucose, Bld: 106 mg/dL — ABNORMAL HIGH (ref 70–99)
Potassium: 3.6 mmol/L (ref 3.5–5.1)
Sodium: 152 mmol/L — ABNORMAL HIGH (ref 135–145)
TOTAL PROTEIN: 5.9 g/dL — AB (ref 6.5–8.1)
Total Bilirubin: 0.9 mg/dL (ref 0.3–1.2)

## 2018-10-01 LAB — URINE CULTURE: Culture: NO GROWTH

## 2018-10-01 LAB — PROTEIN AND GLUCOSE, CSF
Glucose, CSF: 73 mg/dL — ABNORMAL HIGH (ref 40–70)
TOTAL PROTEIN, CSF: 23 mg/dL (ref 15–45)

## 2018-10-01 LAB — CRYPTOCOCCAL ANTIGEN, CSF: Crypto Ag: NEGATIVE

## 2018-10-01 LAB — VANCOMYCIN, TROUGH: Vancomycin Tr: 15 ug/mL (ref 15–20)

## 2018-10-01 MED ORDER — LORAZEPAM 2 MG/ML IJ SOLN
INTRAMUSCULAR | Status: AC
  Administered 2018-10-01: 2 mg
  Filled 2018-10-01: qty 1

## 2018-10-01 MED ORDER — VANCOMYCIN HCL IN DEXTROSE 750-5 MG/150ML-% IV SOLN
750.0000 mg | INTRAVENOUS | Status: DC
  Administered 2018-10-01: 750 mg via INTRAVENOUS
  Filled 2018-10-01: qty 150

## 2018-10-01 MED ORDER — LORAZEPAM 2 MG/ML IJ SOLN
2.0000 mg | Freq: Once | INTRAMUSCULAR | Status: DC
  Filled 2018-10-01 (×2): qty 1

## 2018-10-01 NOTE — Progress Notes (Signed)
LTM started; nurse educated on event button. Dr Doree Albee notified.

## 2018-10-01 NOTE — Progress Notes (Signed)
Pharmacy Antibiotic Note  Katie Mosley is a 78 y.o. female admitted on 09/23/2018 with SOB and weakness, initially treated with Cefepime for sepsis, hypothermia, pancytopenia.  Pharmacy has been consulted for vancomycin and acyclovir dosing for meningitis, ceftriaxone and ampicillin dosing per MD.    Vancomycin 1gm IV given on 2/4.  Vanc trough level tonight, about 24 hrs after initial dose, is 15 mcg/ml.  Appropriate for re-dosing.  Scr up to 2.74. Unable to urinate and bladder scan showed volume 394 ml. Foley inserted and urine return seen. RN reports UOP.  Plan:  Vancomycin 750 mg IV q48hrs to begin tonight.  Target vanc troughs 15-20 mcg/ml.  Adjust Ampicillin 2gm IV q6hrs to q8hrs for current renal function.  Acyclovir 500 mg IV q24hrs  Ceftriaxone 2gm IV q12hrs  Will follow renal function for any need to adjust Vancomycin or Ampicillin regimens.  Follow culture data, clinical course.  Height: 4\' 11"  (149.9 cm) Weight: 116 lb 10 oz (52.9 kg) IBW/kg (Calculated) : 43.2  Temp (24hrs), Avg:98.1 F (36.7 C), Min:97.8 F (36.6 C), Max:98.5 F (36.9 C)  Recent Labs  Lab 09/25/18 0552 09/26/18 0539 09/27/18 0411 09/27/18 0926 09/27/18 1017 09/27/18 1249 09/28/18 0342 09/29/18 0354 09/30/18 0258 10/01/18 0948 10/01/18 1823  WBC 4.1 6.1 10.9*  --   --   --  7.1 4.8  --   --   --   CREATININE 1.54* 2.51* 2.18*  2.20*  --  1.98*  --  1.98*  1.92* 2.21* 2.55* 2.74*  --   LATICACIDVEN  --   --   --  1.2  --  1.1  --   --   --   --   --   VANCOTROUGH  --   --   --   --   --   --   --   --   --   --  15    Estimated Creatinine Clearance: 12.6 mL/min (A) (by C-G formula based on SCr of 2.74 mg/dL (H)).    No Known Allergies  Antimicrobials this admission:  cefepime 1/29  >> 2/3  ceftriaxone 2/4 >>  ampicillin 2/4 >>  acyclovir 2/4 >>  vancomycin 2/4 >>   Dose adjustments this admission:  1/30: renally adjusted Cefepime  2/5:  Vancomycin trough level 15 mcg/ml -  about 24hrs after one-time Vanc 1gm IV dose  2/5: renally adjusted Ampicillin  Microbiology results: 1/29 Blood x 2 - negative 2/1 MRSA PCR: negative 2/3 Respiratory panel:  none detected 2/4 Urine - negative 2/4 Blood x 2 - no growth x 1 day to date 2/5 CSF: pending, no organisms on gram stain  Thank you for allowing pharmacy to be a part of this patient's care.  Arty Baumgartner, [place] Pager: [phone]  10/01/2018 7:59 PM

## 2018-10-01 NOTE — Progress Notes (Signed)
Spoke with MD Spongberg, Patient has had 3 PIV's since arrival and at this time only 1 IV working, patient has multiple IV medications and needs more IV access. At this time I recommended a PICC line or Central line to the MD.  MD Spongberg stated he would put the orders in at this time

## 2018-10-01 NOTE — Procedures (Signed)
Indication: Altered mental status and possible meningitis   Risks of the procedure were dicussed with the patient daughter including post-LP headache, bleeding, infection, weakness/numbness of legs(radiculopathy), death.  The patient's proxy agreed and written consent was obtained.   The patient was prepped and draped, and using sterile technique a 20 gauge quinke spinal needle was inserted in the L4/L5 space.  Approximately 17 cc of CSF were obtained and sent for analysis.   2 attempts were made.  CSF was clear.  No complications were encountered.  Patient tolerated well.  Etta Quill PA-C [facility] [phone]  M-F  (9:00 am- 5:00 PM)  10/01/2018, 9:52 AM

## 2018-10-01 NOTE — Progress Notes (Signed)
LTM EEG checked. No skin breakdown noted

## 2018-10-01 NOTE — Progress Notes (Signed)
Pt has not been able  to urinate ; pt has been IN & Out 3 times already per protocol. PT's last bladder scan volume is 394. MD Spongberg notified and an verbal order for a foley to be inserted was given. RN performed thorough peri care , educated family and inserted foley; urine return was seen. PT tolerated well.

## 2018-10-01 NOTE — Progress Notes (Signed)
SLP Cancellation Note  Patient Details Name: Katie Mosley MRN: [id number] DOB: 12/14/1940   Cancelled treatment:       Reason Eval/Treat Not Completed: Fatigue/lethargy limiting ability to participate(pt remains inappropriate for po, RN reports possible cortrak)   Macario Golds 10/01/2018, 9:38 AM  Luanna Salk, MS [facility] SLP Acute Rehab Services Pager [phone] Office [phone]

## 2018-10-01 NOTE — Progress Notes (Signed)
PROGRESS NOTE    Katie Mosley  OZH:[id number] DOB: 10/30/1940 DOA: 09/23/2018 PCP: Nolene Ebbs, MD   Brief Narrative:  Katie Mosley a 78 y.o.femalewithhistory of diastolic CHF last EF measured in March 2017 was 60 to 65% with grade 1 diastolic dysfunction, chronic hyponatremia, chronic kidney disease stage III, chronic anemia, hypothyroidism hypertension presents to the ER with complaints of having fatigue and shortness of breath for the last 1 week. Patient also noticed increasing swelling in the lower extremities.  Has had some productive cough. Patient also has been feeling weak and fatigued easily. Has not had any dizziness fall or loss of consciousness.   ED Course:In the ER patient EKG shows sinus bradycardia with a heart rate around 54 bpm. Nonspecific ST-T changes. Labs reveal sodium of 123 which is markedly low from her previous. On exam patient has bilateral crepitations with lower extremity edema. Chest x-ray shows peripheral congestion. Patient's hemoglobin is at baseline. But does have some leukopenia. Patient is afebrile. Initially patient was given fluids in the ER by ER physician but given that patient has been likely CHF, now have  discontinued the fluids and start patient on Lasix.  09/27/2018:   Patient   transferred to ICU.patient's mental status continues to wax and wane. Patient is currently awake but nonverbal, confused, unable to also questions. Family's is always by the bedside. All questions and concerns have been addressed. Patient continues to decline despite extensive testing and interventions. Prognosis remains guarded.Neurology consulted as last resort ,recommed LTM EEG, and LP, transfer to [facility] per neuro recommendations       Assessment & Plan:   Principal Problem:   Sepsis ([place]) Active Problems:   Hyponatremia   Hypothyroidism   Hypothermia   Acute encephalopathy   Normocytic anemia   Leukopenia   CKD (chronic kidney disease),  stage IV ([place])   Palliative care encounter   Acute respiratory failure ([place])   Acute encephalopathy, probably combined toxic and metabolic encephalopathy, now with jerking movements: As previously noted, patient presenting with altered mental status progressive weakness from home.  Etiology thought to be likely multifactorial with underlying sepsis/pneumonia versus hyponatremia, versus adult failure to thrive.  Patient was also noted to be hypothermic on admission.  Chest x-ray notable for right-sided infiltrates.  Urinalysis unrevealing. Blood cultures 1/29 NTD, repeat 2/4 pending --Continue with  Nectar thick liquid as per speech therapy recommendations although the patient is not consuming much at all. completed treatment for pneumonia with IV antibiotics.  pro-calcitonin 2/3 was 0.36 --respiratory viral panel is negative --Palliative care following, appreciate assistance, however family not prepared to make any decisions yet-addressed again 2/5-full code -on 09/27/2018: patient was transferred to ICU/stepdown unit due to altered mental status.    Ammonia 19 , lactic acid 1.2 , chest x-ray 09/27/18 shows increased density in the right lung, abdominal x-ray concerning for possible ileus, cardiac BNP trending up, now 2549 , could be secondary to worsening renal function, ABG within normal limits.  Discussed CODE STATUS with the patient's son again today given pt poor clinical course/prognosis, and family want patient to be full code for now.  Further management will depend on hospital course. Given dysarthria,aphasia, son is questioned whether stroke needs to be ruled out, MRI of the brain was negative for stroke EEG showed moderate to severe global slowing , consistent with severe global encephalopathy Patient recently also given cefepime, Reglan, Haldol which could have contributed to her mental status being altered. All of these medications have been  discontinued Neurology Dr.xu has been consulted ,  consulted as last resort ,recommed LTM EEG, and LP, transfer to [facility] per neuro recommendations .Lovenox held for possible LP which was performed by neuro today, clear fluid 17cc  -on full abx vanc , amp, cef, acyclovir all day 2  Pneumonia, suspect gram-negative organism  CXR from 1/29 with right-sided haziness consistent with likely infiltrate. --Afebrile, without leukocytosis --patient has been on antibiotics since 1/29, initially meropenem, then cefepime 2 g IV every 24 hours DC 'd antibiotics 2/3  as patient has completed 5 day course and negative pro-calcitonin, respiratory panel Suspect chronic   aspiration as per speech therapy evaluation noted on 1/30  Acute on chronic diastolic congestive heart failure: -discontinued  IV Lasix due to rising  creatinine/sodium   -patient appears euvolemic,, holding Lasix 40 twice a day by mouth, cxr in am - continue IV metoprolol    Follow renal function closely Instead Patient started on gentle IV fluids with half normal saline , patient is currently on room air and does not appear to be volume overloaded  Hypothyroidism Patient is on levothyroxine 50 mcg p.o. daily at home.  Unclear if she was actually taking this as prescribed.  TSH on admission elevated at 9.  Free T4 normal at 1.39. repeat TSH is 5.717 --Continue IV Synthroid at 37.5mg      Hyponatremia, resolved Patient  presented with a sodium of 127.  Etiology includes volume depletion/dehydration versus SIADH versus adrenal insufficiency versus hypothyroidism.  TSH elevated at 9.0 with a normal free T4.  Urine sodium 75, urine osmolality 323.  Cortisol level normal.   Sodium is now increased to 148 after diuresis. Lasix now placed on hold Recheck bmp in am  Chronic dysphagia Currently speech therapy recommends nectar thick liquids, GI evaluation but the patient is not medically stable for invasive procedures. She continues to have poor oral intake. Yesterday family mentioned initiating  tube feeding. Will place cortrak  and consider initiating TF , if family desires   Anemia Etiology likely of chronic disease.  Normal MCV.  Iron is 144 with a TIBC of 358, ferritin 139, folate 38. --Hemoglobin down to 6.7  ,Status post 1 unit of packed red blood cells, hemoglobin currently stable at 9.4> 8.6>8.2>8.2 .  check FOBT, may benefit from EGD due to chronic dysphagia ,when stable  CKD stage III Creatinine continues to uptrend.  Baseline creatinine between 1.3-1.6. creatinine has been trending up, now 2.21>2.5 .  Bladder scan without any significant retained urine.  Etiology likely prerenal with dehydration/diuresis versus ATN   -discontinued IV Lasix and  Started on half normal saline Trend creatinine   Hypertensive urgency On admission, patient was noted to have significant elevated blood pressures and was started on a clonidine patch.  subsequently became hypotensive and her clonidine patch was removed.now resumed as the blood pressures trending up again  DVT prophylaxis: SCD/Compression stockings  Code Status:     Code Status Orders  (From admission, onward)         Start     Ordered   09/26/18 0946  Full code  Continuous     09/26/18 0947        Code Status History    Date Active Date Inactive Code Status Order ID Comments User Context   09/24/2018 1001 09/26/2018 0947 DNR [id number]  Roney Jaffe, MD ED   09/24/2018 0423 09/24/2018 1001 Full Code [id number]  Rise Patience, MD ED   10/29/2015 0046 11/05/2015 1331 Full Code  [id number]  Eugenie Filler, MD Inpatient   08/08/2015 0432 08/13/2015 1739 Full Code [id number]  Edwin Dada, MD Inpatient   02/13/2015 0552 02/18/2015 1505 Full Code [id number]  Ivor Costa, MD Inpatient   10/31/2014 1332 11/03/2014 1820 Full Code [id number]  Orson Eva, MD Inpatient     Family Communication: son  Disposition Plan:    Consults called: neuro Admission status: Inpatient   Consultants:   neuro  Procedures:  Dg  Chest 2 View  Result Date: 09/24/2018 CLINICAL DATA:  Shortness of breath. EXAM: CHEST - 2 VIEW COMPARISON:  06/22/2018 and 10/16/2017 FINDINGS: There is new pulmonary vascular congestion. There is new peripheral haziness in the right lung cyst pulmonary edema. No discrete effusions. Heart is within normal limits of size considering the AP portable technique. Aortic atherosclerosis.  No acute bone abnormalities. IMPRESSION: New pulmonary vascular congestion with slight peripheral pulmonary edema on the right. Electronically Signed   By: Lorriane Shire M.D.   On: 09/24/2018 04:12   Dg Abd 1 View  Result Date: 09/28/2018 CLINICAL DATA:  Ileus EXAM: ABDOMEN - 1 VIEW COMPARISON:  09/27/2018 FINDINGS: Nonobstructive bowel gas pattern. Mild residual contrast within ascending and transverse colon. No colonic dilatation. Degenerative changes of the lumbar spine. IMPRESSION: Unremarkable abdominal radiograph. Electronically Signed   By: Julian Hy M.D.   On: 09/28/2018 05:08   Dg Abd 1 View  Result Date: 09/27/2018 CLINICAL DATA:  Encounter for feeding tube placement. EXAM: ABDOMEN - 1 VIEW COMPARISON:  Radiograph of same day.  CT scan of June 22, 2018. FINDINGS: No small bowel dilatation is noted. Residual contrast is seen in the colon. Colonic dilatation is noted suggesting possible ileus. No abnormal calcifications are noted. IMPRESSION: Colonic dilatation is noted in left side of abdomen concerning for possible ileus. Electronically Signed   By: Marijo Conception, M.D.   On: 09/27/2018 12:21   Ct Head Wo Contrast  Result Date: 09/27/2018 CLINICAL DATA:  Neurological deficits with involuntary movements. EXAM: CT HEAD WITHOUT CONTRAST TECHNIQUE: Contiguous axial images were obtained from the base of the skull through the vertex without intravenous contrast. COMPARISON:  09/24/2018 FINDINGS: Brain: Diffuse cerebral atrophy. Ventricular dilatation consistent with central atrophy. Low-attenuation changes in  the deep white matter consistent with small vessel ischemia. Old parenchymal calcifications in the basal ganglia and left cerebellum without change. No mass effect or midline shift. No abnormal extra-axial fluid collections. Gray-white matter junctions are distinct. Basal cisterns are not effaced. No acute intracranial hemorrhage. Vascular: Prominent intracranial arterial vascular calcifications are present. Skull: Calvarium appears intact. No acute displaced fractures identified. Sinuses/Orbits: Paranasal sinuses and mastoid air cells are clear. Other: No significant change since prior study. IMPRESSION: No acute intracranial abnormalities. Chronic atrophy and small vessel ischemic changes. Electronically Signed   By: Lucienne Capers M.D.   On: 09/27/2018 03:56   Ct Head Wo Contrast  Result Date: 09/24/2018 CLINICAL DATA:  78 y/o  F; 3 days of weakness. EXAM: CT HEAD WITHOUT CONTRAST TECHNIQUE: Contiguous axial images were obtained from the base of the skull through the vertex without intravenous contrast. COMPARISON:  10/16/2017 CT head. FINDINGS: Brain: No evidence of acute infarction, hemorrhage, hydrocephalus, extra-axial collection or mass lesion/mass effect. Stable dystrophic calcifications in the basal ganglia and calcifications in the left cerebellar hemisphere. 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 Stable chronic microvascular ischemic changes and volume loss of the brain. Vascular: Calcific atherosclerosis of carotid siphons. No hyperdense vessel identified. Skull: Normal. Negative for  fracture or focal lesion. Sinuses/Orbits: No acute finding. Other: Bilateral intra-ocular lens replacement. IMPRESSION: 1. No acute intracranial abnormality identified. 2. Stable chronic microvascular ischemic changes and volume loss of the brain. Stable small chronic lacunar infarct in the right caudate nucleus. Electronically Signed   By: Kristine Garbe M.D.   On: 09/24/2018 05:15    Mr Brain Wo Contrast  Result Date: 09/29/2018 CLINICAL DATA:  Acute encephalopathy. Jerking movements. Progressive weakness. EXAM: MRI HEAD WITHOUT CONTRAST TECHNIQUE: Multiplanar, multiecho pulse sequences of the brain and surrounding structures were obtained without intravenous contrast. COMPARISON:  Head CT 09/27/2018 and MRI 08/08/2015 FINDINGS: Brain: There is no evidence of acute infarct, intracranial hemorrhage, mass, midline shift, or extra-axial fluid collection. Patchy to confluent T2 hyperintensities in the cerebral white matter are slightly progressed from the prior MRI and are nonspecific but compatible with moderate chronic small vessel ischemic disease. Mild T2 hyperintensity in the basal ganglia and thalami is similar to the prior MRI. 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 There is a small remote insult in the left cerebellum with associated calcification on CT. Vascular: Major intracranial vascular flow voids are preserved. Skull and upper cervical spine: Unremarkable bone marrow signal. Sinuses/Orbits: Bilateral cataract extraction. Clear paranasal sinuses. Trace mastoid effusions. Other: None. IMPRESSION: 1. No acute intracranial abnormality. 2. Moderate chronic small vessel ischemic disease and cerebral atrophy. Electronically Signed   By: Logan Bores M.D.   On: 09/29/2018 13:22   Dg Chest Port 1 View  Result Date: 09/30/2018 CLINICAL DATA:  Fever. EXAM: PORTABLE CHEST 1 VIEW COMPARISON:  Chest x-ray 09/27/2018. FINDINGS: Cardiomegaly with bilateral interstitial prominence. Improved aeration from prior exam. Persistent right lower lobe alveolar infiltrate. This could represent asymmetric pulmonary edema and/or pneumonia. Small bilateral pleural effusions. IMPRESSION: 1. Cardiomegaly with bilateral interstitial prominence, improved aeration from prior exam. Findings suggesting improving CHF. Small bilateral pleural effusions. 2.  Persistent right base infiltrate. This could represent asymmetric pulmonary edema and/or pneumonia. Similar findings on prior exam. Electronically Signed   By: [place]   On: 09/30/2018 13:34   Dg Chest Port 1 View  Result Date: 09/27/2018 CLINICAL DATA:  Acute respiratory failure. EXAM: PORTABLE CHEST 1 VIEW COMPARISON:  Chest x-ray dated September 25, 2018. FINDINGS: The patient is rotated to the left, limiting evaluation. Stable cardiomegaly. Unchanged mild interstitial edema, small bilateral pleural effusions, and bibasilar atelectasis. Apparent increased density at the right lung base is likely due to rotation. No pneumothorax. No acute osseous abnormality. IMPRESSION: 1. Unchanged mild interstitial edema, small bilateral pleural effusions, and bibasilar atelectasis. Electronically Signed   By: Titus Dubin M.D.   On: 09/27/2018 10:33   Dg Chest Port 1 View  Result Date: 09/25/2018 CLINICAL DATA:  Follow-up swallow study. EXAM: PORTABLE CHEST 1 VIEW COMPARISON:  Radiographs of September 24, 2018. FINDINGS: Stable cardiomegaly is noted. Atherosclerosis of thoracic aorta is noted. No pneumothorax is noted. Minimal bibasilar subsegmental atelectasis is noted with minimal pleural effusions. Bony thorax is unremarkable. Triangular area of increased density is seen in the region of the upper thoracic spine; is uncertain if this represents retained contrast from prior swallowing study. Rounded barium tablet is not clearly identified. IMPRESSION: Minimal bibasilar subsegmental atelectasis is noted with minimal pleural effusions. Triangular area of increased density is seen projected over upper thoracic spine which potentially may represent residual contrast from prior swallowing study. Aortic Atherosclerosis (ICD10-I70.0). Electronically Signed   By: Marijo Conception, M.D.  On: 09/25/2018 13:18   Dg Swallowing Func-speech Pathology  Result Date: 09/25/2018 Objective Swallowing Evaluation: Type of  Study: MBS-Modified Barium Swallow Study  Patient Details Name: ADILENE AREOLA MRN: [id number] Date of Birth: [birth year] Today's Date: 09/25/2018 Time: SLP Start Time (ACUTE ONLY): 0750 -SLP Stop Time (ACUTE ONLY): 0804 SLP Time Calculation (min) (ACUTE ONLY): 14 min Past Medical History: Past Medical History: Diagnosis Date . Adrenal insufficiency ([place])  . Adrenal insufficiency ([place])   Dx'd [facility] [place] 05/2015 . Asthma  . CHF (congestive heart failure) ([place])  . GERD (gastroesophageal reflux disease)  . Glaucoma  . Headache  . HTN (hypertension)  . Hypertension  . Hypothyroidism  . Normocytic anemia 09/07/2018 . Shortness of breath dyspnea  . Thyroid disease   not know in detail Past Surgical History: Past Surgical History: Procedure Laterality Date . APPENDECTOMY   . BACK SURGERY   . CAPSULOTOMY  08/23/2011  Procedure: MINOR CAPSULOTOMY;  Surgeon: Myrtha Mantis.;  Location: [facility];  Service: Ophthalmology;  Laterality: Left;  YAG Capsulotomy Left Eye . ESOPHAGOGASTRODUODENOSCOPY N/A 03/17/2013  Procedure: ESOPHAGOGASTRODUODENOSCOPY (EGD);  Surgeon: Irene Shipper, MD;  Location: [facility] ENDOSCOPY;  Service: Endoscopy;  Laterality: N/A; . LEFT CATARACT EXTRACTION    2007 . RIGHT CATARACT EXTRACTION    2006 HPI: pt is a 78 yo female adm [facility] with AMS x3 days, CT head + basal ganglia and left cerebellar calcification, 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  Pt with h/o CHF, renal dysfunction. Swallow evauluation ordered as pt had difficulty swallowing a pill this am.  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   Subjective: pt awake in chair Assessment / Plan / Recommendation CHL IP CLINICAL IMPRESSIONS 09/25/2018 Clinical Impression Pt's swallow function was inconsistent throughout MBS - neck extension posture significantly worsened pharyngeal contraction and thus resulted in gross residuals of puree/thin. Oral deficits significant mostly with liquids resulting in  liquids spilling into pharynx not controlled and resulting in aspiration before the swallow with nectar.  Aspiration after the swallow from pyriform sinus spillage did elicit a delayed cough but not adequately strong to clear pharynx.  Chin tuck posture improved airway protection significantly and prevented aspiration as well as decreased pharyngeal residuals.  Cued dry swallows helpful to clear oral residuals.  Of note, pt appeared with significant stasis in proximal esophagus without sensation *just below UES without awareness.  Pudding consistency with tablet did not clear this area despite follow up liquid swallows.  Dependent on family's goal for pt, consideration for GI referral recommended given son's report of chronicity of dysphagia for "a long time".  Recommend to start full liquid diet *nectar* and allow thin water between meals. Use straw to faciliate chin down posture for pt.  Son present and wishes to interpret *not ipad interpreter* therefore allowed in xray.  SLP will follow up for pt tolerance, family education.   SLP Visit Diagnosis Dysphagia, oropharyngeal phase (R13.12);Dysphagia, pharyngoesophageal phase (R13.14) Attention and concentration deficit following -- Frontal lobe and executive function deficit following -- Impact on safety and function Moderate aspiration risk;Risk for inadequate nutrition/hydration   CHL IP TREATMENT RECOMMENDATION 09/25/2018 Treatment Recommendations Therapy as outlined in treatment plan below   Prognosis 09/25/2018 Prognosis for Safe Diet Advancement Fair Barriers to Reach Goals Time post onset Barriers/Prognosis Comment -- CHL IP DIET RECOMMENDATION 09/25/2018 SLP Diet Recommendations Nectar thick liquid Liquid Administration via -- Medication Administration Crushed with puree Compensations Slow rate;Small sips/bites  Postural Changes --   CHL IP OTHER RECOMMENDATIONS 09/25/2018 Recommended Consults Consider GI evaluation Oral Care Recommendations Oral care before and  after PO Other Recommendations Order thickener from pharmacy;Have oral suction available   CHL IP FOLLOW UP RECOMMENDATIONS 09/25/2018 Follow up Recommendations (No Data)   CHL IP FREQUENCY AND DURATION 09/25/2018 Speech Therapy Frequency (ACUTE ONLY) min 2x/week Treatment Duration 1 week      CHL IP ORAL PHASE 09/25/2018 Oral Phase Impaired Oral - Pudding Teaspoon -- Oral - Pudding Cup -- Oral - Honey Teaspoon -- Oral - Honey Cup -- Oral - Nectar Teaspoon Weak lingual manipulation;Decreased bolus cohesion;Reduced posterior propulsion;Premature spillage Oral - Nectar Cup -- Oral - Nectar Straw Weak lingual manipulation;Decreased bolus cohesion;Reduced posterior propulsion;Premature spillage;Delayed oral transit Oral - Thin Teaspoon Decreased bolus cohesion;Weak lingual manipulation;Reduced posterior propulsion;Premature spillage;Lingual/palatal residue;Delayed oral transit Oral - Thin Cup -- Oral - Thin Straw Reduced posterior propulsion;Weak lingual manipulation;Decreased bolus cohesion;Premature spillage;Lingual/palatal residue;Delayed oral transit Oral - Puree Decreased bolus cohesion;Weak lingual manipulation;Lingual pumping;Premature spillage;Lingual/palatal residue;Delayed oral transit Oral - Mech Soft Decreased bolus cohesion;Premature spillage;Delayed oral transit Oral - Regular -- Oral - Multi-Consistency -- Oral - Pill Decreased bolus cohesion;Premature spillage;Weak lingual manipulation;Lingual pumping;Delayed oral transit Oral Phase - Comment --  CHL IP PHARYNGEAL PHASE 09/25/2018 Pharyngeal Phase Impaired Pharyngeal- Pudding Teaspoon -- Pharyngeal -- Pharyngeal- Pudding Cup -- Pharyngeal -- Pharyngeal- Honey Teaspoon -- Pharyngeal -- Pharyngeal- Honey Cup -- Pharyngeal -- Pharyngeal- Nectar Teaspoon -- Pharyngeal -- Pharyngeal- Nectar Cup -- Pharyngeal -- Pharyngeal- Nectar Straw WFL Pharyngeal -- Pharyngeal- Thin Teaspoon Penetration/Aspiration before swallow Pharyngeal Material enters airway, passes BELOW  cords without attempt by patient to eject out (silent aspiration) Pharyngeal- Thin Cup -- Pharyngeal -- Pharyngeal- Thin Straw Penetration/Apiration after swallow;Pharyngeal residue - valleculae;Pharyngeal residue - pyriform Pharyngeal Material enters airway, passes BELOW cords without attempt by patient to eject out (silent aspiration);Material enters airway, passes BELOW cords and not ejected out despite cough attempt by patient Pharyngeal- Puree Reduced tongue base retraction;Reduced airway/laryngeal closure;Reduced laryngeal elevation;Pharyngeal residue - valleculae;Pharyngeal residue - pyriform;Reduced pharyngeal peristalsis Pharyngeal -- Pharyngeal- Mechanical Soft Reduced tongue base retraction;Pharyngeal residue - valleculae Pharyngeal -- Pharyngeal- Regular -- Pharyngeal -- Pharyngeal- Multi-consistency -- Pharyngeal -- Pharyngeal- Pill Pharyngeal residue - valleculae Pharyngeal -- Pharyngeal Comment pt with gross residuals in pharynx intermittently - most notably with head extended upward with swallow, dry swallow attempts are essentially not initiated as very minimal movement observed, aspiration of thin occured after the swallow as pyriform sinus residual spill into open airway - posterior trach,   CHL IP CERVICAL ESOPHAGEAL PHASE 09/25/2018 Cervical Esophageal Phase Impaired Pudding Teaspoon -- Pudding Cup -- Honey Teaspoon -- Honey Cup -- Nectar Teaspoon -- Nectar Cup -- Nectar Straw -- Thin Teaspoon -- Thin Cup -- Thin Straw -- Puree -- Mechanical Soft -- Regular -- Multi-consistency -- Pill -- Cervical Esophageal Comment pt appeared with significant residuals below UES WITHOUT awareness, much more prominent with puree than liquids Macario Golds 09/25/2018, 10:45 AM Luanna Salk, MS [facility] SLP Acute Rehab Services Pager [phone] Office [phone]              Vas [place] Lower Extremity Venous (dvt)  Result Date: 09/24/2018  Lower Venous Study Indications: Edema.  Performing Technologist:  Oliver Hum RVT  Examination Guidelines: A complete evaluation includes B-mode imaging, spectral Doppler, color Doppler, and power Doppler as needed of all accessible portions of each vessel. Bilateral testing is considered an integral part of a complete examination. Limited examinations for reoccurring indications  may be performed as noted.  Right Venous Findings: +---------+---------------+---------+-----------+----------+-------+          CompressibilityPhasicitySpontaneityPropertiesSummary +---------+---------------+---------+-----------+----------+-------+ CFV      Full           Yes      Yes                          +---------+---------------+---------+-----------+----------+-------+ SFJ      Full                                                 +---------+---------------+---------+-----------+----------+-------+ FV Prox  Full                                                 +---------+---------------+---------+-----------+----------+-------+ FV Mid   Full                                                 +---------+---------------+---------+-----------+----------+-------+ FV DistalFull                                                 +---------+---------------+---------+-----------+----------+-------+ PFV      Full                                                 +---------+---------------+---------+-----------+----------+-------+ POP      Full           Yes      Yes                          +---------+---------------+---------+-----------+----------+-------+ PTV      Full                                                 +---------+---------------+---------+-----------+----------+-------+ PERO     Full                                                 +---------+---------------+---------+-----------+----------+-------+  Left Venous Findings: +---------+---------------+---------+-----------+----------+-------+           CompressibilityPhasicitySpontaneityPropertiesSummary +---------+---------------+---------+-----------+----------+-------+ CFV      Full           Yes      Yes                          +---------+---------------+---------+-----------+----------+-------+ SFJ      Full                                                 +---------+---------------+---------+-----------+----------+-------+  FV Prox  Full                                                 +---------+---------------+---------+-----------+----------+-------+ FV Mid   Full                                                 +---------+---------------+---------+-----------+----------+-------+ FV DistalFull                                                 +---------+---------------+---------+-----------+----------+-------+ PFV      Full                                                 +---------+---------------+---------+-----------+----------+-------+ POP      Full           Yes      Yes                          +---------+---------------+---------+-----------+----------+-------+ PTV      Full                                                 +---------+---------------+---------+-----------+----------+-------+ PERO     Full                                                 +---------+---------------+---------+-----------+----------+-------+    Summary: Right: There is no evidence of deep vein thrombosis in the lower extremity. No cystic structure found in the popliteal fossa. Left: There is no evidence of deep vein thrombosis in the lower extremity. No cystic structure found in the popliteal fossa.  *See table(s) above for measurements and observations. Electronically signed by Monica Martinez MD on 09/24/2018 at 3:52:06 PM.    Final      Antimicrobials:   VANC PHARM DOSING,   CEFTRIAXONE 2Q12H,   AMPICILIIN 2Q5H,   ACYCLOVIR 500Q24H   ALL DAY #2 STARTED 2/4   Subjective: Patient still  remains obtunded not interactive.  Son at bedside answered all questions  Objective: Vitals:   10/01/18 0345 10/01/18 0500 10/01/18 0747 10/01/18 1135  BP: (!) 150/105  (!) 180/73 (!) 187/63  Pulse: (!) 128  85 82  Resp:   16   Temp: 98.2 F (36.8 C)  98.5 F (36.9 C) 97.8 F (36.6 C)  TempSrc: Oral  Oral Axillary  SpO2: 95%  98% 100%  Weight:  52.9 kg    Height:        Intake/Output Summary (Last 24 hours) at 10/01/2018 1340 Last data filed at 10/01/2018 0500 Gross per 24 hour  Intake 1047.67 ml  Output 500 ml  Net 547.67 ml  Filed Weights   09/27/18 0500 09/28/18 0500 10/01/18 0500  Weight: 52.4 kg 52 kg 52.9 kg    Examination:  General exam: Appears calm and comfortable  Respiratory system: Clear to auscultation. Respiratory effort normal. Cardiovascular system: S1 & S2 heard, RRR. No JVD, murmurs, rubs, gallops or clicks. No pedal edema. Gastrointestinal system: Abdomen is nondistended, soft and nontender. No organomegaly or masses felt. Normal bowel sounds heard. Central nervous system: Alert and oriented. No focal neurological deficits. Extremities: Symmetric 5 x 5 power. Skin: No rashes, lesions or ulcers Psychiatry: Judgement and insight appear normal. Mood & affect appropriate.     Data Reviewed: I have personally reviewed following labs and imaging studies  CBC: Recent Labs  Lab 09/25/18 0552 09/26/18 0539 09/26/18 2239 09/27/18 0411 09/28/18 0342 09/29/18 0354  WBC 4.1 6.1  --  10.9* 7.1 4.8  NEUTROABS  --   --   --   --  5.5 3.5  HGB 7.6* 6.7* 9.4* 8.6* 8.2* 8.2*  HCT 22.3* 20.1* 28.1* 26.5* 25.1* 25.5*  MCV 91.4 92.6  --  95.3 95.8 97.3  PLT 170 171  --  157 142* 166   Basic Metabolic Panel: Recent Labs  Lab 09/27/18 0411 09/27/18 1017 09/28/18 0342 09/29/18 0354 09/30/18 0258 10/01/18 0948  NA 136  136 137 140  140 145 148* 152*  K 4.3  4.3 4.2 4.0  4.1 3.8 3.7 3.6  CL 111  111 113* 113*  113* 117* 120* 122*  CO2 17*  16* 17*  19*  19* 17* 17* 17*  GLUCOSE 160*  160* 118* 100*  102* 71 100* 106*  BUN 41*  42* 41* 45*  42* 48* 62* 70*  CREATININE 2.18*  2.20* 1.98* 1.98*  1.92* 2.21* 2.55* 2.74*  CALCIUM 7.4*  7.4* 7.7* 8.0*  8.0* 8.4* 8.6* 8.5*  MG 1.7  --  2.4 2.3  --   --   PHOS  --   --  5.0* 4.6  --   --    GFR: Estimated Creatinine Clearance: 12.6 mL/min (A) (by C-G formula based on SCr of 2.74 mg/dL (H)). Liver Function Tests: Recent Labs  Lab 09/27/18 0411 09/28/18 0342 09/29/18 0354 09/30/18 0258 10/01/18 0948  AST 59*  --   --  45* 43*  ALT 25  --   --  25 24  ALKPHOS 64  --   --  59 57  BILITOT 0.6  --   --  1.4* 0.9  PROT 5.9*  --   --  6.1* 5.9*  ALBUMIN 2.3* 2.5* 2.3* 2.4* 2.1*   No results for input(s): LIPASE, AMYLASE in the last 168 hours. Recent Labs  Lab 09/27/18 0926  AMMONIA 19   Coagulation Profile: No results for input(s): INR, PROTIME in the last 168 hours. Cardiac Enzymes: No results for input(s): CKTOTAL, CKMB, CKMBINDEX, TROPONINI in the last 168 hours. BNP (last 3 results) No results for input(s): PROBNP in the last 8760 hours. HbA1C: No results for input(s): HGBA1C in the last 72 hours. CBG: Recent Labs  Lab 09/25/18 0019 09/27/18 0248 09/27/18 0904  GLUCAP 147* 175* 128*   Lipid Profile: No results for input(s): CHOL, HDL, LDLCALC, TRIG, CHOLHDL, LDLDIRECT in the last 72 hours. Thyroid Function Tests: No results for input(s): TSH, T4TOTAL, FREET4, T3FREE, THYROIDAB in the last 72 hours. Anemia Panel: No results for input(s): VITAMINB12, FOLATE, FERRITIN, TIBC, IRON, RETICCTPCT in the last 72 hours. Sepsis Labs: Recent Labs  Lab 09/27/18 0630 09/27/18  1249 09/29/18 0354  PROCALCITON  --   --  0.36  LATICACIDVEN 1.2 1.1  --     Recent Results (from the past 240 hour(s))  Culture, blood (Routine X 2) w Reflex to ID Panel     Status: None   Collection Time: 09/24/18  3:18 PM  Result Value Ref Range Status   Specimen Description   Final     BLOOD RIGHT ARM Performed at [facility] [address], [place], [place] [address]    Special Requests   Final    BOTTLES DRAWN AEROBIC AND ANAEROBIC Blood Culture adequate volume Performed at [facility] [address], [place], [place] [address]    Culture   Final    NO GROWTH 5 DAYS Performed at [facility], [place] [address], [place], [place] [address]    Report Status 09/30/2018 FINAL  Final  Culture, blood (Routine X 2) w Reflex to ID Panel     Status: None   Collection Time: 09/24/18 11:03 PM  Result Value Ref Range Status   Specimen Description BLOOD RIGHT HAND  Final   Special Requests   Final    BOTTLES DRAWN AEROBIC AND ANAEROBIC Blood Culture adequate volume   Culture   Final    NO GROWTH 5 DAYS Performed at [facility], [place] [address], [place], [place] [address]    Report Status 09/30/2018 FINAL  Final  MRSA PCR Screening     Status: None   Collection Time: 09/27/18  9:44 AM  Result Value Ref Range Status   MRSA by PCR NEGATIVE NEGATIVE Final    Comment:        The GeneXpert MRSA Assay (FDA approved for NASAL specimens only), is one component of a comprehensive MRSA colonization surveillance program. It is not intended to diagnose MRSA infection nor to guide or monitor treatment for MRSA infections. Performed at [facility], [place] [address] [place], [place] [address]   Respiratory Panel by PCR     Status: None   Collection Time: 09/29/18  2:22 PM  Result Value Ref Range Status   Adenovirus NOT DETECTED NOT DETECTED Final   Coronavirus 229E NOT DETECTED NOT DETECTED Final    Comment: (NOTE) The Coronavirus on the Respiratory Panel, DOES NOT test for the novel  Coronavirus (2019 nCoV)    Coronavirus HKU1 NOT DETECTED NOT DETECTED Final   Coronavirus NL63 NOT DETECTED NOT DETECTED Final   Coronavirus OC43 NOT DETECTED NOT DETECTED Final   Metapneumovirus NOT DETECTED NOT DETECTED  Final   Rhinovirus / Enterovirus NOT DETECTED NOT DETECTED Final   Influenza A NOT DETECTED NOT DETECTED Final   Influenza B NOT DETECTED NOT DETECTED Final   Parainfluenza Virus 1 NOT DETECTED NOT DETECTED Final   Parainfluenza Virus 2 NOT DETECTED NOT DETECTED Final   Parainfluenza Virus 3 NOT DETECTED NOT DETECTED Final   Parainfluenza Virus 4 NOT DETECTED NOT DETECTED Final   Respiratory Syncytial Virus NOT DETECTED NOT DETECTED Final   Bordetella pertussis NOT DETECTED NOT DETECTED Final   Chlamydophila pneumoniae NOT DETECTED NOT DETECTED Final   Mycoplasma pneumoniae NOT DETECTED NOT DETECTED Final    Comment: Performed at [facility], [place]. [address], [place], [place] [address]  Urine Culture     Status: None   Collection Time: 09/30/18 12:38 PM  Result Value Ref Range Status   Specimen Description   Final    URINE, RANDOM Performed  at [facility], [place] [address], [place], [place] [address]    Special Requests   Final    NONE Performed at [facility], [place] [address], [place], [place] [address]    Culture   Final    NO GROWTH Performed at [facility], [place] [address] [place], [place] [address]    Report Status 10/01/2018 FINAL  Final  CSF culture with Stat gram stain     Status: None (Preliminary result)   Collection Time: 10/01/18  9:55 AM  Result Value Ref Range Status   Specimen Description CSF  Final   Special Requests Normal  Final   Gram Stain   Final    CYTOSPIN SMEAR WBC PRESENT, PREDOMINANTLY MONONUCLEAR NO ORGANISMS SEEN Performed at [facility], [place] [address], [place], [place] [address]    Culture PENDING  Incomplete   Report Status PENDING  Incomplete         Radiology Studies: Dg Chest Port 1 View  Result Date: 09/30/2018 CLINICAL DATA:  Fever. EXAM: PORTABLE CHEST 1 VIEW COMPARISON:  Chest x-ray 09/27/2018. FINDINGS: Cardiomegaly with bilateral interstitial prominence. Improved aeration  from prior exam. Persistent right lower lobe alveolar infiltrate. This could represent asymmetric pulmonary edema and/or pneumonia. Small bilateral pleural effusions. IMPRESSION: 1. Cardiomegaly with bilateral interstitial prominence, improved aeration from prior exam. Findings suggesting improving CHF. Small bilateral pleural effusions. 2. Persistent right base infiltrate. This could represent asymmetric pulmonary edema and/or pneumonia. Similar findings on prior exam. Electronically Signed   By: Marcello Moores  Register   On: 09/30/2018 13:34        Scheduled Meds: . budesonide (PULMICORT) nebulizer solution  0.25 mg Nebulization BID  . chlorhexidine  15 mL Mouth Rinse BID  . cloNIDine  0.3 mg Transdermal Weekly  . ipratropium-albuterol  3 mL Nebulization BID  . levothyroxine  37.5 mcg Intravenous Daily  . LORazepam  2 mg Intravenous Once  . mouth rinse  15 mL Mouth Rinse q12n4p  . metoprolol tartrate  5 mg Intravenous Q6H  . vancomycin variable dose per unstable renal function (pharmacist dosing)   Does not apply See admin instructions   Continuous Infusions: . acyclovir Stopped (09/30/18 1658)  . ampicillin (OMNIPEN) IV 2 g (10/01/18 0951)  . cefTRIAXone (ROCEPHIN)  IV 200 mL/hr at 10/01/18 0413     LOS: 7 days    Time spent: [place], MD [facility]  If 7PM-7AM, please contact night-coverage  10/01/2018, 1:40 PM

## 2018-10-01 NOTE — Progress Notes (Signed)
Mental status: Patient is floridly encephalopathic.  Does not follow commands.  At times shivering.  Does not blink to threat.  Does withdraw from pain.  Moving all extremities antigravity.  No focal output including moaning.  Eyes are held open however as noted above no blink to threat.  Pupils are equal round reactive.  Doll's eyes are intact.  Blood pressure initially was 180/90 however after given 2 mg of Ativan it did drop to 140/90.  2 L of nasal cannula oxygen patient was satting at 100.  We will continue to follow Katie Quill PA-C [facility] [phone]  M-F  (9:00 am- 5:00 PM)  10/01/2018, 9:54 AM

## 2018-10-01 NOTE — Progress Notes (Signed)
Nutrition Follow-up  INTERVENTION:  Pending goals of care, If enteral nutrition warranted, recommend Osmolite 1.2 formula at 25 ml/hr and advance by 10 ml every 6 hours to goal rate of 55 ml/hr.   Recommend free water flushes of 100 ml TID. (MD to adjust as appropriate)  Tube feeding regimen to provide 1584 kcal, 73 grams of protein, and 1382 ml water.   NUTRITION DIAGNOSIS:   Inadequate oral intake related to inability to eat as evidenced by NPO status; ongoing  GOAL:   Patient will meet greater than or equal to 90% of their needs; not met  MONITOR:   Weight trends, Labs, I & O's, Other (Comment)(plan for route of nutrition (oral vs enteral))  REASON FOR ASSESSMENT:   Consult Assessment of nutrition requirement/status, Enteral/tube feeding initiation and management  ASSESSMENT:   78 y.o. female with history of CHF with grade 1 diastolic dysfunction, chronic hyponatremia, CKD stage III, chronic anemia, hypothyroidism, and HTN. She presented to the ED with complaints of fatigue and SOB for the past 1 week. Patient also noticed increasing swelling in the lower extremities. Has had some productive cough.  Patient also has been feeling weak and fatigued easily.  Pt continues on NPO status. Pt not alert enough for PO. Continuous EEG ongoing. Family at bedside reports pt was eating well PTA with usual consumption of at least 3 meals a day. Family reports pt with no po intake over the past couple of days. Pt full code currently. Patient's son with possible decision with changing pt status to DNR and comfort as pt with poor prognosis. Per MD, if pt continues full code tomorrow, then will plan  for initiating tube feeding. Noted [facility] feeding tube team service available 8am-4pm Monday, Wednesday, and Friday. RN may place NGT from unit stock in the mean time for tube feeding initiation.Tube feeding recommendations stated above. RD to continue to monitor.   Labs and medications reviewed.  Sodium elevated at 152. Chloride elevated at 122.   Diet Order:   Diet Order            Diet NPO time specified  Diet effective now              EDUCATION NEEDS:   Not appropriate for education at this time  Skin:  Skin Assessment: Reviewed RN Assessment  Last BM:  2/1  Height:   Ht Readings from Last 1 Encounters:  09/23/18 4' 11"  (1.499 m)    Weight:   Wt Readings from Last 1 Encounters:  10/01/18 52.9 kg    Ideal Body Weight:  42.91 kg  BMI:  Body mass index is 23.56 kg/m.  Estimated Nutritional Needs:   Kcal:  1560-1820 kcal  Protein:  65-75 grams  Fluid:  >/= 1.6 L/day    Corrin Parker, MS, RD, LDN Pager # [phone] After hours/ weekend pager # [phone]

## 2018-10-02 DIAGNOSIS — E870 Hyperosmolality and hypernatremia: Secondary | ICD-10-CM

## 2018-10-02 DIAGNOSIS — D649 Anemia, unspecified: Secondary | ICD-10-CM

## 2018-10-02 LAB — CBC WITH DIFFERENTIAL/PLATELET
ABS IMMATURE GRANULOCYTES: 0.03 10*3/uL (ref 0.00–0.07)
Basophils Absolute: 0 10*3/uL (ref 0.0–0.1)
Basophils Relative: 1 %
Eosinophils Absolute: 0.1 10*3/uL (ref 0.0–0.5)
Eosinophils Relative: 3 %
HCT: 26.6 % — ABNORMAL LOW (ref 36.0–46.0)
Hemoglobin: 8.4 g/dL — ABNORMAL LOW (ref 12.0–15.0)
IMMATURE GRANULOCYTES: 1 %
LYMPHS ABS: 0.8 10*3/uL (ref 0.7–4.0)
Lymphocytes Relative: 18 %
MCH: 30.1 pg (ref 26.0–34.0)
MCHC: 31.6 g/dL (ref 30.0–36.0)
MCV: 95.3 fL (ref 80.0–100.0)
Monocytes Absolute: 0.5 10*3/uL (ref 0.1–1.0)
Monocytes Relative: 12 %
Neutro Abs: 2.8 10*3/uL (ref 1.7–7.7)
Neutrophils Relative %: 65 %
Platelets: 205 10*3/uL (ref 150–400)
RBC: 2.79 MIL/uL — ABNORMAL LOW (ref 3.87–5.11)
RDW: 17.2 % — ABNORMAL HIGH (ref 11.5–15.5)
WBC: 4.2 10*3/uL (ref 4.0–10.5)
nRBC: 0 % (ref 0.0–0.2)

## 2018-10-02 LAB — BASIC METABOLIC PANEL
Anion gap: 16 — ABNORMAL HIGH (ref 5–15)
Anion gap: 15 (ref 5–15)
BUN: 71 mg/dL — ABNORMAL HIGH (ref 8–23)
BUN: 76 mg/dL — ABNORMAL HIGH (ref 8–23)
CALCIUM: 8.3 mg/dL — AB (ref 8.9–10.3)
CHLORIDE: 123 mmol/L — AB (ref 98–111)
CO2: 18 mmol/L — ABNORMAL LOW (ref 22–32)
CO2: 18 mmol/L — ABNORMAL LOW (ref 22–32)
CREATININE: 2.63 mg/dL — AB (ref 0.44–1.00)
Calcium: 7.9 mg/dL — ABNORMAL LOW (ref 8.9–10.3)
Chloride: 122 mmol/L — ABNORMAL HIGH (ref 98–111)
Creatinine, Ser: 2.71 mg/dL — ABNORMAL HIGH (ref 0.44–1.00)
GFR calc Af Amer: 19 mL/min — ABNORMAL LOW (ref >60)
GFR calc Af Amer: 19 mL/min — ABNORMAL LOW (ref >60)
GFR calc non Af Amer: 16 mL/min — ABNORMAL LOW (ref >60)
GFR, EST NON AFRICAN AMERICAN: 17 mL/min — AB (ref >60)
Glucose, Bld: 112 mg/dL — ABNORMAL HIGH (ref 70–99)
Glucose, Bld: 145 mg/dL — ABNORMAL HIGH (ref 70–99)
POTASSIUM: 3.5 mmol/L (ref 3.5–5.1)
Potassium: 3.7 mmol/L (ref 3.5–5.1)
Sodium: 156 mmol/L — ABNORMAL HIGH (ref 135–145)
Sodium: 156 mmol/L — ABNORMAL HIGH (ref 135–145)

## 2018-10-02 LAB — HSV DNA BY PCR (REFERENCE LAB)
HSV 1 DNA: NEGATIVE
HSV 2 DNA: NEGATIVE

## 2018-10-02 LAB — CSF IGG: IgG, CSF: 2.2 mg/dL (ref 0.0–8.6)

## 2018-10-02 MED ORDER — METOPROLOL TARTRATE 5 MG/5ML IV SOLN
10.0000 mg | Freq: Four times a day (QID) | INTRAVENOUS | Status: DC
  Administered 2018-10-02 – 2018-10-07 (×22): 10 mg via INTRAVENOUS
  Filled 2018-10-02 (×25): qty 10

## 2018-10-02 MED ORDER — DEXTROSE-NACL 5-0.45 % IV SOLN
INTRAVENOUS | Status: DC
  Administered 2018-10-02 – 2018-10-03 (×2): via INTRAVENOUS

## 2018-10-02 NOTE — Progress Notes (Addendum)
NEUROLOGY PROGRESS NOTE  Subjective: At this point patient still remains obtunded only grimacing to pain.  Follows no instructions - nonverbal.  Exam: Vitals:   10/02/18 0830 10/02/18 0846  BP:  (!) 174/60  Pulse:    Resp: 19 (!) 22  Temp:    SpO2: 100%     Physical Exam  HEENT-  Normocephalic, no lesions, without obvious abnormality.  Normal external eye and conjunctiva.  Extremities- Warm, dry and intact Musculoskeletal-no joint tenderness, deformity or swelling Skin-warm and dry, no hyperpigmentation, vitiligo, or suspicious lesions  Neuro:  Mental Status: As noted above patient is obtunded, only grimacing to pain.  Does not follow any verbal commands, does not wake up to verbal stimuli and at this point I could not get her to even localize to pain. Cranial Nerves: II: Does not blink to threat III,IV, VI: Doll's eyes intact. Pupils equal, round, reactive to light  V,VII: Face symmetric, grimaces to pain Motor: Moving upper and lower extremities antigravity, upper greater than lower to noxious stimuli only Sensory: Moves to pain upper extremities > lower extremities Plantars: Right: downgoing   Left: downgoing Cerebellar/Gait: Unable to assess    Medications:  Scheduled: . budesonide (PULMICORT) nebulizer solution  0.25 mg Nebulization BID  . chlorhexidine  15 mL Mouth Rinse BID  . cloNIDine  0.3 mg Transdermal Weekly  . ipratropium-albuterol  3 mL Nebulization BID  . levothyroxine  37.5 mcg Intravenous Daily  . LORazepam  2 mg Intravenous Once  . mouth rinse  15 mL Mouth Rinse q12n4p  . metoprolol tartrate  5 mg Intravenous Q6H    Pertinent Labs/Diagnostics: Currently hooked up to LTM EEG awaiting final reading  CSF results for Katie Mosley, Katie Mosley (MRN [id number]) as of 10/02/2018 09:11  Ref. Range 10/01/2018 09:47  Appearance, CSF Latest Ref Range: CLEAR  CLEAR  Glucose, CSF Latest Ref Range: 40 - 70 mg/dL 73 (H)  RBC Count, CSF Latest Ref Range: 0 /cu mm 1 (H)   WBC, CSF Latest Ref Range: 0 - 5 /cu mm 1  Other Cells, CSF Unknown TOO FEW TO COUNT, SMEAR AVAILABLE FOR REVIEW  Color, CSF Latest Ref Range: COLORLESS  COLORLESS  Supernatant Unknown NOT INDICATED  Total  Protein, CSF Latest Ref Range: 15 - 45 mg/dL 23  Tube # Unknown 3   -Fungal culture pending -CSF culture with stain shows no growth -Cryptococcal antigen negative -Herpes simplex virus with PCR pending - CSF IgG pending -Initial miscellaneous [facility] test for TB on CSF was put in yesterday but for some reason this was DC'd by the lab.  I have again put this in with the [facility] lab to perform and asked to have it sent out stat  LTM EEG report for today: This was a moderately abnormal continuous video EEG due to generalized background slowing with triphasic waves, indicative of a toxic-metabolic encephalopathy pattern. No seizures or epileptiform discharges were seen.    Katie Quill PA-C [facility] [phone]   Assessment: 78 year old female with encephalopathy in the context of pneumonia, hypothyroidism and worsened renal function.  1. The patient remains floridly encephalopathic and obtunded to the point where she only responds to pain.   2. At this point CSF has been obtained and does not show any leukocytosis or other initial findings suggestive of bacterial infection. Still awaiting HSV PCR, IgG index, bacterial and fungal cultures.   3. Etiology for her AMS most likely toxic/metabolic   4. EEG with triphasic waves indicative of  a toxic-metabolic encephalopathy. No seizures or epileptiform discharges seen. The EEG findings as well as clinical features are consistent with her twitching being secondary to encephalopathy rather than seizure   Recommendations: - Discontinued LTM - Awaiting additional CSF test results -- Continue supportive care   Electronically signed: Dr. Kerney Elbe 10/02/2018, 9:08 AM

## 2018-10-02 NOTE — Progress Notes (Addendum)
Patient ID: Katie Mosley, female   DOB: September 05, 1940, 78 y.o.   MRN: [id number]  This NP visited patient at the bedside as a follow up for palliative medicine needs and emotional support. Son at bedside   Patient remains minimally responsive, responding only to pain.  She does not follow commands.  Appears cachectic.  No clear understanding of this encephalopathy.  Neurology still waiting on several labs and cultures.  Created space and opportunity to explore his thoughts and feelings regarding current medical situation of the patient/his mother.  He is finding it difficult to understand why they cannot find a reason for his mother's rapid decline  Previous conversation the son reported that his mother was fully independent with no cognitive deficits however today on further exploration he speaks to significant memory loss.  Discussed subtle changes on brain MRI  We discussed the possibility that sometimes medicine never can find a specific root cause for these kinds of situations.  Sometimes the body simply begins to fail to thrive within the context of human mortality.   Son reports that family has spiritual support from the local mosque and and Imam   Discussed the difference between an aggressive medical intervention path and a palliative comfort path for this patient at this time in this situation. Family are open to all offered and available medical interventions to prolong life and are hopeful for improvement.  Discussed with family  the importance of continued conversation with his family and the medical providers regarding overall plan of care and treatment options,  ensuring decisions are within the context of the patients values and GOCs.  Questions and concerns addressed   PMT will continue to support holistically  Total time spent on the unit was 35 minutes       Greater than 50% of the time was spent in counseling and coordination of care  Wadie Lessen NP  Palliative Medicine  Team Team Phone # [phone] Pager [phone]

## 2018-10-02 NOTE — Plan of Care (Signed)
  Problem: Nutrition: Goal: Adequate nutrition will be maintained Outcome: Not Progressing   

## 2018-10-02 NOTE — Progress Notes (Signed)
Physical Therapy Treatment Patient Details Name: Katie Mosley MRN: [id number] DOB: 1941-08-07 Today's Date: 10/02/2018    History of Present Illness 78 yo female admitted to ED on 1/28 for weakness and fatigue, with medical diagnosis of sepsis with possible PNA. PMH includes adrenal insufficiency, hypothyroid, CHF, HTN, hypothyroidism, dyspnea, CKDIII.     PT Comments    Pt did not respond purposefully to any stimuli, but responded to noxious and to son's voice in general.  Sat EOB for ~09 min trying to elicit truncal reaction and responses.  Follow Up Recommendations  SNF     Equipment Recommendations  None recommended by PT    Recommendations for Other Services       Precautions / Restrictions Precautions Precautions: Fall Precaution Comments: pt is [place], son translates    Mobility  Bed Mobility Overal bed mobility: Needs Assistance Bed Mobility: Supine to Sit;Sit to Supine     Supine to sit: Total assist;+2 for physical assistance Sit to supine: Total assist;+2 for physical assistance   General bed mobility comments: pt did not respond appropriately to motions intended to produce startle.  Transfers Overall transfer level: Needs assistance Equipment used: None Transfers: Sit to/from Stand Sit to Stand: Max assist;+2 safety/equipment         General transfer comment: pt accepted some w/bearing in standing automatically, but no other purposeful responses.  Ambulation/Gait                 Stairs             Wheelchair Mobility    Modified Rankin (Stroke Patients Only)       Balance     Sitting balance-Leahy Scale: Zero       Standing balance-Leahy Scale: Poor                              Cognition Arousal/Alertness: Lethargic Behavior During Therapy: Flat affect Overall Cognitive Status: Impaired/Different from baseline Area of Impairment: Orientation;Attention;Memory;Safety/judgement;Following commands;Problem  solving;Awareness                               General Comments: pt is not responsive purposefully to any input.  She opens eyes to noxious stimuli.  Pt's son was not able to get pt to localize to him to "mama"      Exercises      General Comments        Pertinent Vitals/Pain Pain Assessment: Faces Faces Pain Scale: No hurt    Home Living                      Prior Function            PT Goals (current goals can now be found in the care plan section) Acute Rehab PT Goals PT Goal Formulation: With family Time For Goal Achievement: 10/09/18 Potential to Achieve Goals: Fair Progress towards PT goals: Not progressing toward goals - comment(not participative yet)    Frequency    Min 2X/week      PT Plan Current plan remains appropriate    Co-evaluation              AM-PAC PT "6 Clicks" Mobility   Outcome Measure  Help needed turning from your back to your side while in a flat bed without using bedrails?: Total Help needed moving from lying on your back to  sitting on the side of a flat bed without using bedrails?: Total Help needed moving to and from a bed to a chair (including a wheelchair)?: Total Help needed standing up from a chair using your arms (e.g., wheelchair or bedside chair)?: Total Help needed to walk in hospital room?: Total Help needed climbing 3-5 steps with a railing? : Total 6 Click Score: 6    End of Session         PT Visit Diagnosis: Other abnormalities of gait and mobility (R26.89);Difficulty in walking, not elsewhere classified (R26.2)     Time: 0370-9643 PT Time Calculation (min) (ACUTE ONLY): 22 min  Charges:  $Therapeutic Activity: 8-22 mins                     10/02/2018  Katie Mosley, PT Acute Rehabilitation Services [phone]  (pager) [phone]  (office)   Katie Mosley 10/02/2018, 3:51 PM

## 2018-10-02 NOTE — Progress Notes (Signed)
SLP Cancellation Note  Patient Details Name: Katie Mosley MRN: [id number] DOB: 08-01-1941   Cancelled treatment:       Reason Eval/Treat Not Completed: Other (comment);Fatigue/lethargy limiting ability to participate;Medical issues which prohibited therapy(RN reports increased MEWS number and RR increased with congestion thus Rapid Response called, pt continues to be lethargic)  Rec continue NPO with adequate oral care.    Macario Golds 10/02/2018, 9:42 AM   Luanna Salk, MS [facility] SLP [place] Pager [phone] Office [phone]

## 2018-10-02 NOTE — Progress Notes (Signed)
Pharmacy Antibiotic Note  Katie Mosley is a 78 y.o. female admitted on 09/23/2018 with SOB and weakness, initially treated with Cefepime for sepsis, hypothermia, pancytopenia.  Pharmacy has been consulted for vancomycin and acyclovir dosing for meningitis, ceftriaxone and ampicillin dosing per MD.  Everything has come back neg including cultures, LP, HSV. D/w with Dr Cheral Marker today and we will dc abx.  Plan: Dc vanc/ceftriaxone/amp/acyclovir   Height: 4\' 11"  (149.9 cm) Weight: 116 lb 10 oz (52.9 kg) IBW/kg (Calculated) : 43.2  Temp (24hrs), Avg:98 F (36.7 C), Min:97.4 F (36.3 C), Max:98.6 F (37 C)  Recent Labs  Lab 09/26/18 0539 09/27/18 0411 09/27/18 0926  09/27/18 1249 09/28/18 0342 09/29/18 0354 09/30/18 0258 10/01/18 0948 10/01/18 1823 10/02/18 0815  WBC 6.1 10.9*  --   --   --  7.1 4.8  --   --   --  4.2  CREATININE 2.51* 2.18*  2.20*  --    < >  --  1.98*  1.92* 2.21* 2.55* 2.74*  --  2.63*  LATICACIDVEN  --   --  1.2  --  1.1  --   --   --   --   --   --   VANCOTROUGH  --   --   --   --   --   --   --   --   --  15  --    < > = values in this interval not displayed.    Estimated Creatinine Clearance: 13.1 mL/min (A) (by C-G formula based on SCr of 2.63 mg/dL (H)).    No Known Allergies  Antimicrobials this admission:  cefepime 1/29  >> 2/3  ceftriaxone 2/4 >>2/6  ampicillin 2/4 >>2/6  acyclovir 2/4 >>2/6  vancomycin 2/4 >> 2/6  Dose adjustments this admission:  1/30: renally adjusted Cefepime  2/5:  Vancomycin trough level 15 mcg/ml - about 24hrs after one-time Vanc 1gm IV dose  2/5: renally adjusted Ampicillin  Microbiology results: 1/29 Blood x 2 - negative 2/1 MRSA PCR: negative 2/3 Respiratory panel:  none detected 2/4 Urine - negative 2/4 Blood x 2 - no growth x 1 day to date 2/5 CSF: pending, no organisms on gram stain  Onnie Boer, PharmD, BCIDP, AAHIVP, CPP Infectious Disease Pharmacist 10/02/2018 12:26 PM

## 2018-10-02 NOTE — Significant Event (Signed)
Rapid Response Event Note  Overview:  Called with change in MEWS score Time Called: 0921 Arrival Time: 0935 Event Type: MEWS  Initial Focused Assessment:  Patient unresponsive to stimuli - no change  - RR reg and unlabored rate 16 - snoring with open mouth - cleared with manual closing of mouth - bil BS = clear - hot to touch - BP 144/47 HR 86 rectal temp done 99.1.  1 liter nasal cannula 99%.  MEWS elevated secondary to elevated BP and RR now resolved.  No changes in LOC.  No acute distress.     Interventions:  No RRT interventions - placed on radar.  Dr. Wyonia Hough notified by Laverda Sorenson RN.    Plan of Care (if not transferred):  Event Summary: Name of Physician Notified: Dr. Wyonia Hough at (pta RRT)    at    Outcome: Other (Comment)  Event End Time: [place]  Quin Hoop

## 2018-10-02 NOTE — Procedures (Signed)
LTM-EEG Report  HISTORY: Continuous video-EEG monitoring performed for 78 year old with encephalopathy. ACQUISITION: International 10-20 system for electrode placement; 18 channels with additional eyes linked to ipsilateral ears and EKG. Additional T1-T2 electrodes were used. Continuous video recording obtained.   EEG NUMBER:  MEDICATIONS:  Day 1: see EMR  DAY #1: from 1210 10/01/18 to 0730 10/02/18 BACKGROUND: An overall medium voltage continuous recording with poor spontaneous variability and reactivity. Waking background consisted of medium voltage 2-6Hz  activity bilaterally without evidence of a posterior dominant rhythm. Atypical arousal patterns were present with increased slow activity with waking. No clear sleep architecture was seen.   EPILEPTIFORM/PERIODIC ACTIVITY: Occasional to frequent triphasic waves with sharp morphology, 0.5-1Hz  frequency, and A-P lag. These were increased with stimulation. They did not have particular epileptiform appearance.  SEIZURES: none EVENTS: none reported  EKG: no significant arrhythmia  SUMMARY: This was a moderately abnormal continuous video EEG due to generalized background slowing with triphasic waves, indicative of a toxic-metabolic encephalopathy pattern. No seizures or epileptiform discharges were seen.

## 2018-10-02 NOTE — Progress Notes (Signed)
EEG LTM complete. No skin breakdown 

## 2018-10-02 NOTE — Progress Notes (Signed)
PROGRESS NOTE    Katie Mosley  KCL:[id number] DOB: 08/31/1940 DOA: 09/23/2018 PCP: Nolene Ebbs, MD   Brief Narrative:  As previously noted: Katie Mosley a 78 y.o.femalewithhistory of diastolic CHF last EF measured in March 2017 was 60 to 65% with grade 1 diastolic dysfunction, chronic hyponatremia, chronic kidney disease stage III, chronic anemia, hypothyroidism hypertension presents to the ER with complaints of having fatigue and shortness of breath for the last 1 week. Patient also noticed increasing swelling in the lower extremities. Has had some productive cough. Patient also has been feeling weak and fatigued easily. Has not had any dizziness fall or loss of consciousness.  ED Course:In the ER patient EKG shows sinus bradycardia with a heart rate around 54 bpm. Nonspecific ST-T changes. Labs reveal sodium of 123 which is markedly low from her previous. On exam patient has bilateral crepitations with lower extremity edema. Chest x-ray shows peripheral congestion. Patient's hemoglobin is at baseline. But does have some leukopenia. Patient is afebrile. Initially patient was given fluids in the ER by ER physician but given that patient has been likely CHF, now havediscontinued the fluids and start patient on Lasix.  09/27/2018: Patient transferred to ICU.patient's mental status continues to wax and wane. Patient is currently awake but nonverbal, confused, unable to also questions. Family's is always by the bedside. All questions and concerns have been addressed. Patient continues to decline despite extensive testing and interventions. Prognosis remains guarded.Neurology consulted as last resort ,recommed LTM EEG, and LP, transfer to [facility] per neuro recommendations     Assessment & Plan:   Principal Problem:   Sepsis ([place]) Active Problems:   Hyponatremia   Hypothyroidism   Hypothermia   Acute encephalopathy   Normocytic anemia   Leukopenia   CKD  (chronic kidney disease), stage IV ([place])   Palliative care encounter   Acute respiratory failure ([place])   Acute encephalopathy, probably combined toxic and metabolic encephalopathy, now with jerking movements: As previously noted, patient presenting with altered mental status progressive weakness from home. Etiology thought to be likely multifactorial with underlying sepsis/pneumonia versus hyponatremia, versus adult failure to thrive. Patient was also noted to be hypothermic on admission. Chest x-ray notable for right-sided infiltrates. Urinalysis unrevealing. Blood cultures 1/29 NTD, repeat 2/4 NTD --Continue with Nectar thick liquid as per speech therapy recommendations although the patient is not consuming much at all. completedtreatment for pneumonia with IV antibiotics. pro-calcitonin 2/3 was 0.36, will need to discuss with family possible PEG tube given patient's inability to take p.o. intake or follow commands --respiratory viral panel is negative --Palliative care following, appreciate assistance, however family not prepared to make any decisions yet-addressed again on 2/5-full code -on 09/27/2018:patient was transferredto ICU/stepdown unit due to altered mental status. Ammonia 19 , lactic acid 1.2 , chest x-ray 2/1/20shows increased density in the right lung, abdominal x-ray concerning for possible ileus, cardiac BNP trending up, now 2549 , could be secondary to worsening renal function, ABG within normal limits.  Discussed CODE STATUS with the patient's son again today given pt poor clinical course/prognosis, and family want patient to be full code for now. Further management will depend on hospital course. Given dysarthria,aphasia, son is questioned whether stroke needs to be ruled out,MRI of the brain was negative for stroke EEG showed moderate to severe global slowing , consistent with severe global encephalopathy but no acute seizure-like activity. LP was done no indication  of infection, clear fluid, 1 white blood cell, Gram stain negative, culture pending. -on  full abx vanc , amp, cef, acyclovir all day 2 Discussed with neurology today no clear etiology of encephalopathy.   Pneumonia, suspect gram-negative organism CXR from 1/29 with right-sided haziness consistent with likely infiltrate. --Afebrile, without leukocytosis --patient has been on antibiotics since 1/29, initially meropenem,thencefepime 2 g IV every 24 hours DC'dantibiotics2/3as patient has completed 5 day course and negativepro-calcitonin, respiratory panel normal white blood cell, and afebrile Suspect chronic aspiration as per speech therapy evaluation noted on 1/30 unfortunate unable to repeat eval secondary to patient's cephalopathy  Acute on chronic diastolic congestive heart failure: -discontinuedIV Lasix due to risingcreatinine/sodium  -patient appears euvolemic,,holdingLasix 40 twice a day by mouth, cxr in am - continue IV metoprolol  Follow renal function closely Instead Patient started on gentle IV fluids with half normal saline , patient is currently on room air and does not appear to be volume overloaded  Hypothyroidism Patient is on levothyroxine 50 mcg p.o. daily at home. Unclear if she was actually taking this as prescribed. TSH on admission elevated at 9. Free T4 normal at 1.39. repeat TSH is 5.717 --Continue IV Synthroid at 37.5mg     Hypernatremia.  This appears to be volume related patient is unable to take any p.o. intake and.  We will gently hydrate with half-normal saline, recheck a BMP 1500.   Chronic dysphagia Currently speech therapy recommends nectar thick liquids, GI evaluation but the patient is not medically stable for invasive procedures. She continues to have poor oral intake. Yesterday family mentioned initiating tube feeding. Will place cortrak and consider initiating TF , if family desires  Anemia Etiology likely of chronic  disease. Normal MCV. Iron is 144 with a TIBC of 358, ferritin 139, folate 38. --Hemoglobin down to 6.7,Status post 1 unit of packed red blood cells, hemoglobin currently stable at 9.4> 8.6>8.2>8.2. check FOBT, may benefit from EGD due to chronic dysphagia ,whenstable  CKD stage III Creatinine continues to uptrend. Baseline creatinine between 1.3-1.6. creatinine has been trending up, now 2.21>2.5. Bladder scan without any significant retained urine. Etiology likely prerenal with dehydration/diuresis versus ATN  -discontinuedIV Lasix and Started on half normal saline Trend creatinine  Hypertensive urgency On admission, patient was noted to have significant elevated blood pressures and was started on a clonidine patch. subsequently became hypotensive and her clonidine patch was removed prior to transfer.  Currently patient blood pressure being controlled with IV medications given her n.p.o. status and encephalopathy.   DVT prophylaxis: SCD/Compression stockings  Code Status:     Code Status Orders  (From admission, onward)         Start     Ordered   09/26/18 0946  Full code  Continuous     09/26/18 0947        Code Status History    Date Active Date Inactive Code Status Order ID Comments User Context   09/24/2018 1001 09/26/2018 0947 DNR [id number]  Roney Jaffe, MD ED   09/24/2018 0423 09/24/2018 1001 Full Code [id number]  Rise Patience, MD ED   10/29/2015 0046 11/05/2015 1331 Full Code [id number]  Eugenie Filler, MD Inpatient   08/08/2015 0432 08/13/2015 1739 Full Code [id number]  Edwin Dada, MD Inpatient   02/13/2015 0552 02/18/2015 1505 Full Code [id number]  Ivor Costa, MD Inpatient   10/31/2014 1332 11/03/2014 1820 Full Code [id number]  Orson Eva, MD Inpatient     Family Communication: Son and granddaughter Disposition Plan:   Unknown at this time Consults called: Neurology Admission status: Inpatient   Consultants:  Neurology  Procedures:    Dg Chest 2 View  Result Date: 09/24/2018 CLINICAL DATA:  Shortness of breath. EXAM: CHEST - 2 VIEW COMPARISON:  06/22/2018 and 10/16/2017 FINDINGS: There is new pulmonary vascular congestion. There is new peripheral haziness in the right lung cyst pulmonary edema. No discrete effusions. Heart is within normal limits of size considering the AP portable technique. Aortic atherosclerosis.  No acute bone abnormalities. IMPRESSION: New pulmonary vascular congestion with slight peripheral pulmonary edema on the right. Electronically Signed   By: Lorriane Shire M.D.   On: 09/24/2018 04:12   Dg Abd 1 View  Result Date: 09/28/2018 CLINICAL DATA:  Ileus EXAM: ABDOMEN - 1 VIEW COMPARISON:  09/27/2018 FINDINGS: Nonobstructive bowel gas pattern. Mild residual contrast within ascending and transverse colon. No colonic dilatation. Degenerative changes of the lumbar spine. IMPRESSION: Unremarkable abdominal radiograph. Electronically Signed   By: Julian Hy M.D.   On: 09/28/2018 05:08   Dg Abd 1 View  Result Date: 09/27/2018 CLINICAL DATA:  Encounter for feeding tube placement. EXAM: ABDOMEN - 1 VIEW COMPARISON:  Radiograph of same day.  CT scan of June 22, 2018. FINDINGS: No small bowel dilatation is noted. Residual contrast is seen in the colon. Colonic dilatation is noted suggesting possible ileus. No abnormal calcifications are noted. IMPRESSION: Colonic dilatation is noted in left side of abdomen concerning for possible ileus. Electronically Signed   By: Marijo Conception, M.D.   On: 09/27/2018 12:21   Ct Head Wo Contrast  Result Date: 09/27/2018 CLINICAL DATA:  Neurological deficits with involuntary movements. EXAM: CT HEAD WITHOUT CONTRAST TECHNIQUE: Contiguous axial images were obtained from the base of the skull through the vertex without intravenous contrast. COMPARISON:  09/24/2018 FINDINGS: Brain: Diffuse cerebral atrophy. Ventricular dilatation consistent with central atrophy. Low-attenuation  changes in the deep white matter consistent with small vessel ischemia. Old parenchymal calcifications in the basal ganglia and left cerebellum without change. No mass effect or midline shift. No abnormal extra-axial fluid collections. Gray-white matter junctions are distinct. Basal cisterns are not effaced. No acute intracranial hemorrhage. Vascular: Prominent intracranial arterial vascular calcifications are present. Skull: Calvarium appears intact. No acute displaced fractures identified. Sinuses/Orbits: Paranasal sinuses and mastoid air cells are clear. Other: No significant change since prior study. IMPRESSION: No acute intracranial abnormalities. Chronic atrophy and small vessel ischemic changes. Electronically Signed   By: Lucienne Capers M.D.   On: 09/27/2018 03:56   Ct Head Wo Contrast  Result Date: 09/24/2018 CLINICAL DATA:  78 y/o  F; 3 days of weakness. EXAM: CT HEAD WITHOUT CONTRAST TECHNIQUE: Contiguous axial images were obtained from the base of the skull through the vertex without intravenous contrast. COMPARISON:  10/16/2017 CT head. FINDINGS: Brain: No evidence of acute infarction, hemorrhage, hydrocephalus, extra-axial collection or mass lesion/mass effect. Stable dystrophic calcifications in the basal ganglia and calcifications in the left cerebellar hemisphere. 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 Stable chronic microvascular ischemic changes and volume loss of the brain. Vascular: Calcific atherosclerosis of carotid siphons. No hyperdense vessel identified. Skull: Normal. Negative for fracture or focal lesion. Sinuses/Orbits: No acute finding. Other: Bilateral intra-ocular lens replacement. IMPRESSION: 1. No acute intracranial abnormality identified. 2. Stable chronic microvascular ischemic changes and volume loss of the brain. Stable small chronic lacunar infarct in the right caudate nucleus. Electronically Signed   By: Kristine Garbe M.D.   On:  09/24/2018 05:15   Mr Brain Wo Contrast  Result Date: 09/29/2018 CLINICAL DATA:  Acute encephalopathy. Jerking  movements. Progressive weakness. EXAM: MRI HEAD WITHOUT CONTRAST TECHNIQUE: Multiplanar, multiecho pulse sequences of the brain and surrounding structures were obtained without intravenous contrast. COMPARISON:  Head CT 09/27/2018 and MRI 08/08/2015 FINDINGS: Brain: There is no evidence of acute infarct, intracranial hemorrhage, mass, midline shift, or extra-axial fluid collection. Patchy to confluent T2 hyperintensities in the cerebral white matter are slightly progressed from the prior MRI and are nonspecific but compatible with moderate chronic small vessel ischemic disease. Mild T2 hyperintensity in the basal ganglia and thalami is similar to the prior MRI. 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 There is a small remote insult in the left cerebellum with associated calcification on CT. Vascular: Major intracranial vascular flow voids are preserved. Skull and upper cervical spine: Unremarkable bone marrow signal. Sinuses/Orbits: Bilateral cataract extraction. Clear paranasal sinuses. Trace mastoid effusions. Other: None. IMPRESSION: 1. No acute intracranial abnormality. 2. Moderate chronic small vessel ischemic disease and cerebral atrophy. Electronically Signed   By: Logan Bores M.D.   On: 09/29/2018 13:22   Dg Chest Port 1 View  Result Date: 09/30/2018 CLINICAL DATA:  Fever. EXAM: PORTABLE CHEST 1 VIEW COMPARISON:  Chest x-ray 09/27/2018. FINDINGS: Cardiomegaly with bilateral interstitial prominence. Improved aeration from prior exam. Persistent right lower lobe alveolar infiltrate. This could represent asymmetric pulmonary edema and/or pneumonia. Small bilateral pleural effusions. IMPRESSION: 1. Cardiomegaly with bilateral interstitial prominence, improved aeration from prior exam. Findings suggesting improving CHF. Small bilateral pleural  effusions. 2. Persistent right base infiltrate. This could represent asymmetric pulmonary edema and/or pneumonia. Similar findings on prior exam. Electronically Signed   By: Shirley   On: 09/30/2018 13:34   Dg Chest Port 1 View  Result Date: 09/27/2018 CLINICAL DATA:  Acute respiratory failure. EXAM: PORTABLE CHEST 1 VIEW COMPARISON:  Chest x-ray dated September 25, 2018. FINDINGS: The patient is rotated to the left, limiting evaluation. Stable cardiomegaly. Unchanged mild interstitial edema, small bilateral pleural effusions, and bibasilar atelectasis. Apparent increased density at the right lung base is likely due to rotation. No pneumothorax. No acute osseous abnormality. IMPRESSION: 1. Unchanged mild interstitial edema, small bilateral pleural effusions, and bibasilar atelectasis. Electronically Signed   By: Titus Dubin M.D.   On: 09/27/2018 10:33   Dg Chest Port 1 View  Result Date: 09/25/2018 CLINICAL DATA:  Follow-up swallow study. EXAM: PORTABLE CHEST 1 VIEW COMPARISON:  Radiographs of September 24, 2018. FINDINGS: Stable cardiomegaly is noted. Atherosclerosis of thoracic aorta is noted. No pneumothorax is noted. Minimal bibasilar subsegmental atelectasis is noted with minimal pleural effusions. Bony thorax is unremarkable. Triangular area of increased density is seen in the region of the upper thoracic spine; is uncertain if this represents retained contrast from prior swallowing study. Rounded barium tablet is not clearly identified. IMPRESSION: Minimal bibasilar subsegmental atelectasis is noted with minimal pleural effusions. Triangular area of increased density is seen projected over upper thoracic spine which potentially may represent residual contrast from prior swallowing study. Aortic Atherosclerosis (ICD10-I70.0). Electronically Signed   By: Marijo Conception, M.D.   On: 09/25/2018 13:18   Dg Swallowing Func-speech Pathology  Result Date: 09/25/2018 Objective Swallowing  Evaluation: Type of Study: MBS-Modified Barium Swallow Study  Patient Details Name: ASYA DERRYBERRY MRN: [id number] Date of Birth: December 18, 1940 Today's Date: 09/25/2018 Time: SLP Start Time (ACUTE ONLY): 0750 -SLP Stop Time (ACUTE ONLY): 0804 SLP Time Calculation (min) (ACUTE ONLY): 14 min Past Medical History: Past Medical History: Diagnosis Date . Adrenal insufficiency ([place])  .  Adrenal insufficiency ([place])   Dx'd [facility] 05/2015 . Asthma  . CHF (congestive heart failure) ([place])  . GERD (gastroesophageal reflux disease)  . Glaucoma  . Headache  . HTN (hypertension)  . Hypertension  . Hypothyroidism  . Normocytic anemia 09/07/2018 . Shortness of breath dyspnea  . Thyroid disease   not know in detail Past Surgical History: Past Surgical History: Procedure Laterality Date . APPENDECTOMY   . BACK SURGERY   . CAPSULOTOMY  08/23/2011  Procedure: MINOR CAPSULOTOMY;  Surgeon: Myrtha Mantis.;  Location: [place];  Service: Ophthalmology;  Laterality: Left;  YAG Capsulotomy Left Eye . ESOPHAGOGASTRODUODENOSCOPY N/A 03/17/2013  Procedure: ESOPHAGOGASTRODUODENOSCOPY (EGD);  Surgeon: Irene Shipper, MD;  Location: [facility] ENDOSCOPY;  Service: Endoscopy;  Laterality: N/A; . LEFT CATARACT EXTRACTION    2007 . RIGHT CATARACT EXTRACTION    2006 HPI: pt is a 78 yo female adm [facility] with AMS x3 days, CT head + basal ganglia and left cerebellar calcification, 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  Pt with h/o CHF, renal dysfunction. Swallow evauluation ordered as pt had difficulty swallowing a pill this am.  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   Subjective: pt awake in chair Assessment / Plan / Recommendation CHL IP CLINICAL IMPRESSIONS 09/25/2018 Clinical Impression Pt's swallow function was inconsistent throughout MBS - neck extension posture significantly worsened pharyngeal contraction and thus resulted in gross residuals of puree/thin. Oral deficits significant mostly with  liquids resulting in liquids spilling into pharynx not controlled and resulting in aspiration before the swallow with nectar.  Aspiration after the swallow from pyriform sinus spillage did elicit a delayed cough but not adequately strong to clear pharynx.  Chin tuck posture improved airway protection significantly and prevented aspiration as well as decreased pharyngeal residuals.  Cued dry swallows helpful to clear oral residuals.  Of note, pt appeared with significant stasis in proximal esophagus without sensation *just below UES without awareness.  Pudding consistency with tablet did not clear this area despite follow up liquid swallows.  Dependent on family's goal for pt, consideration for GI referral recommended given son's report of chronicity of dysphagia for "a long time".  Recommend to start full liquid diet *nectar* and allow thin water between meals. Use straw to faciliate chin down posture for pt.  Son present and wishes to interpret *not ipad interpreter* therefore allowed in xray.  SLP will follow up for pt tolerance, family education.   SLP Visit Diagnosis Dysphagia, oropharyngeal phase (R13.12);Dysphagia, pharyngoesophageal phase (R13.14) Attention and concentration deficit following -- Frontal lobe and executive function deficit following -- Impact on safety and function Moderate aspiration risk;Risk for inadequate nutrition/hydration   CHL IP TREATMENT RECOMMENDATION 09/25/2018 Treatment Recommendations Therapy as outlined in treatment plan below   Prognosis 09/25/2018 Prognosis for Safe Diet Advancement Fair Barriers to Reach Goals Time post onset Barriers/Prognosis Comment -- CHL IP DIET RECOMMENDATION 09/25/2018 SLP Diet Recommendations Nectar thick liquid Liquid Administration via -- Medication Administration Crushed with puree Compensations Slow rate;Small sips/bites Postural Changes --   CHL IP OTHER RECOMMENDATIONS 09/25/2018 Recommended Consults Consider GI evaluation Oral Care Recommendations  Oral care before and after PO Other Recommendations Order thickener from pharmacy;Have oral suction available   CHL IP FOLLOW UP RECOMMENDATIONS 09/25/2018 Follow up Recommendations (No Data)   CHL IP FREQUENCY AND DURATION 09/25/2018 Speech Therapy Frequency (ACUTE ONLY) min 2x/week Treatment Duration 1 week      CHL IP ORAL PHASE  09/25/2018 Oral Phase Impaired Oral - Pudding Teaspoon -- Oral - Pudding Cup -- Oral - Honey Teaspoon -- Oral - Honey Cup -- Oral - Nectar Teaspoon Weak lingual manipulation;Decreased bolus cohesion;Reduced posterior propulsion;Premature spillage Oral - Nectar Cup -- Oral - Nectar Straw Weak lingual manipulation;Decreased bolus cohesion;Reduced posterior propulsion;Premature spillage;Delayed oral transit Oral - Thin Teaspoon Decreased bolus cohesion;Weak lingual manipulation;Reduced posterior propulsion;Premature spillage;Lingual/palatal residue;Delayed oral transit Oral - Thin Cup -- Oral - Thin Straw Reduced posterior propulsion;Weak lingual manipulation;Decreased bolus cohesion;Premature spillage;Lingual/palatal residue;Delayed oral transit Oral - Puree Decreased bolus cohesion;Weak lingual manipulation;Lingual pumping;Premature spillage;Lingual/palatal residue;Delayed oral transit Oral - Mech Soft Decreased bolus cohesion;Premature spillage;Delayed oral transit Oral - Regular -- Oral - Multi-Consistency -- Oral - Pill Decreased bolus cohesion;Premature spillage;Weak lingual manipulation;Lingual pumping;Delayed oral transit Oral Phase - Comment --  CHL IP PHARYNGEAL PHASE 09/25/2018 Pharyngeal Phase Impaired Pharyngeal- Pudding Teaspoon -- Pharyngeal -- Pharyngeal- Pudding Cup -- Pharyngeal -- Pharyngeal- Honey Teaspoon -- Pharyngeal -- Pharyngeal- Honey Cup -- Pharyngeal -- Pharyngeal- Nectar Teaspoon -- Pharyngeal -- Pharyngeal- Nectar Cup -- Pharyngeal -- Pharyngeal- Nectar Straw WFL Pharyngeal -- Pharyngeal- Thin Teaspoon Penetration/Aspiration before swallow Pharyngeal Material enters  airway, passes BELOW cords without attempt by patient to eject out (silent aspiration) Pharyngeal- Thin Cup -- Pharyngeal -- Pharyngeal- Thin Straw Penetration/Apiration after swallow;Pharyngeal residue - valleculae;Pharyngeal residue - pyriform Pharyngeal Material enters airway, passes BELOW cords without attempt by patient to eject out (silent aspiration);Material enters airway, passes BELOW cords and not ejected out despite cough attempt by patient Pharyngeal- Puree Reduced tongue base retraction;Reduced airway/laryngeal closure;Reduced laryngeal elevation;Pharyngeal residue - valleculae;Pharyngeal residue - pyriform;Reduced pharyngeal peristalsis Pharyngeal -- Pharyngeal- Mechanical Soft Reduced tongue base retraction;Pharyngeal residue - valleculae Pharyngeal -- Pharyngeal- Regular -- Pharyngeal -- Pharyngeal- Multi-consistency -- Pharyngeal -- Pharyngeal- Pill Pharyngeal residue - valleculae Pharyngeal -- Pharyngeal Comment pt with gross residuals in pharynx intermittently - most notably with head extended upward with swallow, dry swallow attempts are essentially not initiated as very minimal movement observed, aspiration of thin occured after the swallow as pyriform sinus residual spill into open airway - posterior trach,   CHL IP CERVICAL ESOPHAGEAL PHASE 09/25/2018 Cervical Esophageal Phase Impaired Pudding Teaspoon -- Pudding Cup -- Honey Teaspoon -- Honey Cup -- Nectar Teaspoon -- Nectar Cup -- Nectar Straw -- Thin Teaspoon -- Thin Cup -- Thin Straw -- Puree -- Mechanical Soft -- Regular -- Multi-consistency -- Pill -- Cervical Esophageal Comment pt appeared with significant residuals below UES WITHOUT awareness, much more prominent with puree than liquids Macario Golds 09/25/2018, 10:45 AM Luanna Salk, MS [facility] SLP Acute Rehab Services Pager [phone] Office [phone]              Vas [place] Lower Extremity Venous (dvt)  Result Date: 09/24/2018  Lower Venous Study Indications: Edema.   Performing Technologist: Oliver Hum RVT  Examination Guidelines: A complete evaluation includes B-mode imaging, spectral Doppler, color Doppler, and power Doppler as needed of all accessible portions of each vessel. Bilateral testing is considered an integral part of a complete examination. Limited examinations for reoccurring indications may be performed as noted.  Right Venous Findings: +---------+---------------+---------+-----------+----------+-------+          CompressibilityPhasicitySpontaneityPropertiesSummary +---------+---------------+---------+-----------+----------+-------+ CFV      Full           Yes      Yes                          +---------+---------------+---------+-----------+----------+-------+ SFJ  Full                                                 +---------+---------------+---------+-----------+----------+-------+ FV Prox  Full                                                 +---------+---------------+---------+-----------+----------+-------+ FV Mid   Full                                                 +---------+---------------+---------+-----------+----------+-------+ FV DistalFull                                                 +---------+---------------+---------+-----------+----------+-------+ PFV      Full                                                 +---------+---------------+---------+-----------+----------+-------+ POP      Full           Yes      Yes                          +---------+---------------+---------+-----------+----------+-------+ PTV      Full                                                 +---------+---------------+---------+-----------+----------+-------+ PERO     Full                                                 +---------+---------------+---------+-----------+----------+-------+  Left Venous Findings: +---------+---------------+---------+-----------+----------+-------+           CompressibilityPhasicitySpontaneityPropertiesSummary +---------+---------------+---------+-----------+----------+-------+ CFV      Full           Yes      Yes                          +---------+---------------+---------+-----------+----------+-------+ SFJ      Full                                                 +---------+---------------+---------+-----------+----------+-------+ FV Prox  Full                                                 +---------+---------------+---------+-----------+----------+-------+  FV Mid   Full                                                 +---------+---------------+---------+-----------+----------+-------+ FV DistalFull                                                 +---------+---------------+---------+-----------+----------+-------+ PFV      Full                                                 +---------+---------------+---------+-----------+----------+-------+ POP      Full           Yes      Yes                          +---------+---------------+---------+-----------+----------+-------+ PTV      Full                                                 +---------+---------------+---------+-----------+----------+-------+ PERO     Full                                                 +---------+---------------+---------+-----------+----------+-------+    Summary: Right: There is no evidence of deep vein thrombosis in the lower extremity. No cystic structure found in the popliteal fossa. Left: There is no evidence of deep vein thrombosis in the lower extremity. No cystic structure found in the popliteal fossa.  *See table(s) above for measurements and observations. Electronically signed by Monica Martinez MD on 09/24/2018 at 3:52:06 PM.    Final      Antimicrobials:   VANC PHARM DOSING,   CEFTRIAXONE 2Q12H,   AMPICILIIN 2Q5H,   ACYCLOVIR 500Q24H   ALL DAY #3 STARTED 2/4    Subjective: Patient remains  obtunded.  Discussed plan of care with son.  Also discussed plan of care with granddaughter who is a family physician in [place] who we discussed with over the phone.  Objective: Vitals:   10/02/18 0846 10/02/18 0850 10/02/18 0855 10/02/18 1121  BP: (!) 174/60   (!) 144/47  Pulse:    82  Resp: (!) 22 (!) 23 (!) 32 16  Temp:    (!) 97.4 F (36.3 C)  TempSrc:    Axillary  SpO2:    99%  Weight:      Height:        Intake/Output Summary (Last 24 hours) at 10/02/2018 1152 Last data filed at 10/02/2018 0100 Gross per 24 hour  Intake 796.36 ml  Output 400 ml  Net 396.36 ml   Filed Weights   09/27/18 0500 09/28/18 0500 10/01/18 0500  Weight: 52.4 kg 52 kg 52.9 kg    Examination:  General exam: Encephalopathic Respiratory system: Clear to auscultation. Respiratory effort normal. Cardiovascular system: Mild  sinus tachycardia no murmurs noted Gastrointestinal system: Abdomen is nondistended, soft and nontender. No organomegaly or masses felt. Normal bowel sounds heard. Central nervous system: Retracts to pain stimuli, minimally responsive otherwise Extremities: Symmetric 5 x 5 power.  Reflexes were in place Skin: No rashes, lesions or ulcers Psychiatry: Unable to assess as patient is noninteractive secondary to encephalopathy    Data Reviewed: I have personally reviewed following labs and imaging studies  CBC: Recent Labs  Lab 09/26/18 0539 09/26/18 2239 09/27/18 0411 09/28/18 0342 09/29/18 0354 10/02/18 0815  WBC 6.1  --  10.9* 7.1 4.8 4.2  NEUTROABS  --   --   --  5.5 3.5 2.8  HGB 6.7* 9.4* 8.6* 8.2* 8.2* 8.4*  HCT 20.1* 28.1* 26.5* 25.1* 25.5* 26.6*  MCV 92.6  --  95.3 95.8 97.3 95.3  PLT 171  --  157 142* 167 341   Basic Metabolic Panel: Recent Labs  Lab 09/27/18 0411  09/28/18 0342 09/29/18 0354 09/30/18 0258 10/01/18 0948 10/02/18 0815  NA 136  136   < > 140  140 145 148* 152* 156*  K 4.3  4.3   < > 4.0  4.1 3.8 3.7 3.6 3.7  CL 111  111   < > 113*  113*  117* 120* 122* 122*  CO2 17*  16*   < > 19*  19* 17* 17* 17* 18*  GLUCOSE 160*  160*   < > 100*  102* 71 100* 106* 112*  BUN 41*  42*   < > 45*  42* 48* 62* 70* 71*  CREATININE 2.18*  2.20*   < > 1.98*  1.92* 2.21* 2.55* 2.74* 2.63*  CALCIUM 7.4*  7.4*   < > 8.0*  8.0* 8.4* 8.6* 8.5* 8.3*  MG 1.7  --  2.4 2.3  --   --   --   PHOS  --   --  5.0* 4.6  --   --   --    < > = values in this interval not displayed.   GFR: Estimated Creatinine Clearance: 13.1 mL/min (A) (by C-G formula based on SCr of 2.63 mg/dL (H)). Liver Function Tests: Recent Labs  Lab 09/27/18 0411 09/28/18 0342 09/29/18 0354 09/30/18 0258 10/01/18 0948  AST 59*  --   --  45* 43*  ALT 25  --   --  25 24  ALKPHOS 64  --   --  59 57  BILITOT 0.6  --   --  1.4* 0.9  PROT 5.9*  --   --  6.1* 5.9*  ALBUMIN 2.3* 2.5* 2.3* 2.4* 2.1*   No results for input(s): LIPASE, AMYLASE in the last 168 hours. Recent Labs  Lab 09/27/18 0926  AMMONIA 19   Coagulation Profile: No results for input(s): INR, PROTIME in the last 168 hours. Cardiac Enzymes: No results for input(s): CKTOTAL, CKMB, CKMBINDEX, TROPONINI in the last 168 hours. BNP (last 3 results) No results for input(s): PROBNP in the last 8760 hours. HbA1C: No results for input(s): HGBA1C in the last 72 hours. CBG: Recent Labs  Lab 09/27/18 0248 09/27/18 0904  GLUCAP 175* 128*   Lipid Profile: No results for input(s): CHOL, HDL, LDLCALC, TRIG, CHOLHDL, LDLDIRECT in the last 72 hours. Thyroid Function Tests: No results for input(s): TSH, T4TOTAL, FREET4, T3FREE, THYROIDAB in the last 72 hours. Anemia Panel: No results for input(s): VITAMINB12, FOLATE, FERRITIN, TIBC, IRON, RETICCTPCT in the last 72 hours. Sepsis Labs: Recent Labs  Lab 09/27/18 9379 09/27/18  1249 09/29/18 0354  PROCALCITON  --   --  0.36  LATICACIDVEN 1.2 1.1  --     Recent Results (from the past 240 hour(s))  Culture, blood (Routine X 2) w Reflex to ID Panel     Status:  None   Collection Time: 09/24/18  3:18 PM  Result Value Ref Range Status   Specimen Description   Final    BLOOD RIGHT ARM Performed at [facility] [address], [place], [place] [address]    Special Requests   Final    BOTTLES DRAWN AEROBIC AND ANAEROBIC Blood Culture adequate volume Performed at [facility] [address], [place], [place] [address]    Culture   Final    NO GROWTH 5 DAYS Performed at [facility], [place] [address], [place], [place] [address]    Report Status 09/30/2018 FINAL  Final  Culture, blood (Routine X 2) w Reflex to ID Panel     Status: None   Collection Time: 09/24/18 11:03 PM  Result Value Ref Range Status   Specimen Description BLOOD RIGHT HAND  Final   Special Requests   Final    BOTTLES DRAWN AEROBIC AND ANAEROBIC Blood Culture adequate volume   Culture   Final    NO GROWTH 5 DAYS Performed at [facility], [place] [address], [place], [place] [address]    Report Status 09/30/2018 FINAL  Final  MRSA PCR Screening     Status: None   Collection Time: 09/27/18  9:44 AM  Result Value Ref Range Status   MRSA by PCR NEGATIVE NEGATIVE Final    Comment:        The GeneXpert MRSA Assay (FDA approved for NASAL specimens only), is one component of a comprehensive MRSA colonization surveillance program. It is not intended to diagnose MRSA infection nor to guide or monitor treatment for MRSA infections. Performed at [facility], [place] [address], [place], [place] [address]   Respiratory Panel by PCR     Status: None   Collection Time: 09/29/18  2:22 PM  Result Value Ref Range Status   Adenovirus NOT DETECTED NOT DETECTED Final   Coronavirus 229E NOT DETECTED NOT DETECTED Final    Comment: (NOTE) The Coronavirus on the Respiratory Panel, DOES NOT test for the novel  Coronavirus (2019 nCoV)    Coronavirus HKU1 NOT DETECTED NOT DETECTED Final   Coronavirus NL63 NOT DETECTED NOT  DETECTED Final   Coronavirus OC43 NOT DETECTED NOT DETECTED Final   Metapneumovirus NOT DETECTED NOT DETECTED Final   Rhinovirus / Enterovirus NOT DETECTED NOT DETECTED Final   Influenza A NOT DETECTED NOT DETECTED Final   Influenza B NOT DETECTED NOT DETECTED Final   Parainfluenza Virus 1 NOT DETECTED NOT DETECTED Final   Parainfluenza Virus 2 NOT DETECTED NOT DETECTED Final   Parainfluenza Virus 3 NOT DETECTED NOT DETECTED Final   Parainfluenza Virus 4 NOT DETECTED NOT DETECTED Final   Respiratory Syncytial Virus NOT DETECTED NOT DETECTED Final   Bordetella pertussis NOT DETECTED NOT DETECTED Final   Chlamydophila pneumoniae NOT DETECTED NOT DETECTED Final   Mycoplasma pneumoniae NOT DETECTED NOT DETECTED Final    Comment: Performed at [facility], [place]. [address] [place], [place] [address]  Culture, blood (Routine X 2) w Reflex to ID Panel     Status: None (Preliminary result)   Collection Time: 09/30/18 11:36 AM  Result Value Ref Range Status   Specimen  Description   Final    BLOOD LEFT HAND Performed at [facility] [address], [place], [place] [address]    Special Requests   Final    BOTTLES DRAWN AEROBIC AND ANAEROBIC Blood Culture adequate volume Performed at [facility] [address] [place], [place] [address]    Culture   Final    NO GROWTH 2 DAYS Performed at [facility] [address] [place], [place] [address]    Report Status PENDING  Incomplete  Culture, blood (Routine X 2) w Reflex to ID Panel     Status: None (Preliminary result)   Collection Time: 09/30/18 11:37 AM  Result Value Ref Range Status   Specimen Description   Final    BLOOD RIGHT HAND Performed at [facility] [address], [place], [place] [address]    Special Requests   Final    BOTTLES DRAWN AEROBIC AND ANAEROBIC Blood Culture adequate volume Performed at [facility] [address],  [place], [place] [address]    Culture   Final    NO GROWTH 2 DAYS Performed at [facility] [address] [place], [place] [address]    Report Status PENDING  Incomplete  Urine Culture     Status: None   Collection Time: 09/30/18 12:38 PM  Result Value Ref Range Status   Specimen Description   Final    URINE, RANDOM Performed at [facility] [address] [place], [place] [address]    Special Requests   Final    NONE Performed at [facility], [place] [address] [place], [place] [address]    Culture   Final    NO GROWTH Performed at [facility], [place] [address] [place], [place] [address]    Report Status 10/01/2018 FINAL  Final  CSF culture with Stat gram stain     Status: None (Preliminary result)   Collection Time: 10/01/18  9:55 AM  Result Value Ref Range Status   Specimen Description CSF  Final   Special Requests Normal  Final   Gram Stain   Final    CYTOSPIN SMEAR WBC PRESENT, PREDOMINANTLY MONONUCLEAR NO ORGANISMS SEEN    Culture   Final    NO GROWTH 1 DAY Performed at [facility], [place] [address] [place], [place] [address]    Report Status PENDING  Incomplete         Radiology Studies: Dg Chest Port 1 View  Result Date: 09/30/2018 CLINICAL DATA:  Fever. EXAM: PORTABLE CHEST 1 VIEW COMPARISON:  Chest x-ray 09/27/2018. FINDINGS: Cardiomegaly with bilateral interstitial prominence. Improved aeration from prior exam. Persistent right lower lobe alveolar infiltrate. This could represent asymmetric pulmonary edema and/or pneumonia. Small bilateral pleural effusions. IMPRESSION: 1. Cardiomegaly with bilateral interstitial prominence, improved aeration from prior exam. Findings suggesting improving CHF. Small bilateral pleural effusions. 2. Persistent right base infiltrate. This could represent asymmetric pulmonary edema and/or pneumonia. Similar findings on prior exam. Electronically Signed   By: Marcello Moores  Register    On: 09/30/2018 13:34        Scheduled Meds: . budesonide (PULMICORT) nebulizer solution  0.25 mg Nebulization BID  . chlorhexidine  15 mL Mouth Rinse BID  . cloNIDine  0.3 mg Transdermal Weekly  . ipratropium-albuterol  3 mL Nebulization BID  . levothyroxine  37.5 mcg Intravenous Daily  . LORazepam  2 mg Intravenous Once  . mouth rinse  15 mL Mouth Rinse q12n4p  . metoprolol tartrate  10 mg Intravenous Q6H   Continuous Infusions: . acyclovir Stopped (10/01/18 1650)  . ampicillin (OMNIPEN) IV 2 g (10/02/18 0837)  . cefTRIAXone (ROCEPHIN)  IV 2 g (10/02/18 0254)  . vancomycin Stopped (10/01/18 2226)     LOS: 8 days    Time spent: [place], MD [facility]  If 7PM-7AM, please contact night-coverage  10/02/2018, 11:52 AM

## 2018-10-02 NOTE — Progress Notes (Signed)
Pt's current MEWS score is a 5. Related to her increased blood pressure and increased respiratory rate. MD Spongberg notified in person; rapid response also notified. Will continue to monitor

## 2018-10-03 LAB — BASIC METABOLIC PANEL
Anion gap: 9 (ref 5–15)
Anion gap: 8 (ref 5–15)
BUN: 75 mg/dL — AB (ref 8–23)
BUN: 74 mg/dL — ABNORMAL HIGH (ref 8–23)
CALCIUM: 8 mg/dL — AB (ref 8.9–10.3)
CO2: 19 mmol/L — ABNORMAL LOW (ref 22–32)
CO2: 20 mmol/L — ABNORMAL LOW (ref 22–32)
Calcium: 8 mg/dL — ABNORMAL LOW (ref 8.9–10.3)
Chloride: 126 mmol/L — ABNORMAL HIGH (ref 98–111)
Chloride: 126 mmol/L — ABNORMAL HIGH (ref 98–111)
Creatinine, Ser: 2.62 mg/dL — ABNORMAL HIGH (ref 0.44–1.00)
Creatinine, Ser: 2.55 mg/dL — ABNORMAL HIGH (ref 0.44–1.00)
GFR calc Af Amer: 20 mL/min — ABNORMAL LOW (ref >60)
GFR calc Af Amer: 20 mL/min — ABNORMAL LOW (ref >60)
GFR calc non Af Amer: 17 mL/min — ABNORMAL LOW (ref >60)
GFR calc non Af Amer: 17 mL/min — ABNORMAL LOW (ref >60)
Glucose, Bld: 184 mg/dL — ABNORMAL HIGH (ref 70–99)
Glucose, Bld: 155 mg/dL — ABNORMAL HIGH (ref 70–99)
Potassium: 3.3 mmol/L — ABNORMAL LOW (ref 3.5–5.1)
Potassium: 3.4 mmol/L — ABNORMAL LOW (ref 3.5–5.1)
Sodium: 154 mmol/L — ABNORMAL HIGH (ref 135–145)
Sodium: 154 mmol/L — ABNORMAL HIGH (ref 135–145)

## 2018-10-03 LAB — SODIUM, URINE, RANDOM: Sodium, Ur: 21 mmol/L

## 2018-10-03 LAB — MTB NAA WITHOUT AFB CULTURE: SOURCE: NEGATIVE

## 2018-10-03 LAB — OSMOLALITY: OSMOLALITY: 347 mosm/kg — AB (ref 275–295)

## 2018-10-03 LAB — OSMOLALITY, URINE: Osmolality, Ur: 355 mOsm/kg (ref 300–900)

## 2018-10-03 LAB — PROTIME-INR
INR: 1.13
Prothrombin Time: 14.4 seconds (ref 11.4–15.2)

## 2018-10-03 MED ORDER — KCL IN DEXTROSE-NACL 20-5-0.2 MEQ/L-%-% IV SOLN
INTRAVENOUS | Status: DC
  Administered 2018-10-03 – 2018-10-04 (×2): via INTRAVENOUS
  Filled 2018-10-03 (×3): qty 1000

## 2018-10-03 NOTE — Progress Notes (Addendum)
NEUROLOGY PROGRESS NOTE  Subjective: Patient in bed, NAD, 1L [place] oxygen SAT 100. Family at bedside. Patient does not respond to name calling or any verbal stimuli. grimaces to pain. Family helped to interpret commands which patient did not follow. Patient did have occasional twitching of right arm. Per RN: intermittent right face and arm twitching as well.  Exam: Vitals:   10/03/18 0845 10/03/18 1200  BP:  (!) 165/70  Pulse:  70  Resp: 15 16  Temp:  (!) 97.5 F (36.4 C)  SpO2: 100% 100%    Physical Exam  HEENT-  Normocephalic, no lesions, without obvious abnormality.  Normal external eye and conjunctiva.  Extremities- Warm, dry and intact Musculoskeletal-generalized edema Skin-warm and dry, no hyperpigmentation, vitiligo, or suspicious lesions  Neuro:  Mental Status: Patient is obtunded, only grimacing to pain. Not responding to name calling. Does not speak or understand [place]. Family translated for me.  Does not follow any verbal commands, does not open eyes to verbal stimuli.  Cranial Nerves: II: Does not blink to threat III,IV, VI: Doll's eyes intact. Left pupil 7 mm ( family states this is baseline)  Minimally reactive to light, right pupil 3 mm and reactive to light. V,VII: Face symmetric, grimaces to pain Motor/ Sensory: Moves extremities in response to pain only,  Did not break gravity in any extremity. Plantars: Right: downgoing   Left: downgoing Cerebellar/Gait: Unable to assess    Medications:  Scheduled: . budesonide (PULMICORT) nebulizer solution  0.25 mg Nebulization BID  . chlorhexidine  15 mL Mouth Rinse BID  . cloNIDine  0.3 mg Transdermal Weekly  . ipratropium-albuterol  3 mL Nebulization BID  . levothyroxine  37.5 mcg Intravenous Daily  . LORazepam  2 mg Intravenous Once  . mouth rinse  15 mL Mouth Rinse q12n4p  . metoprolol tartrate  10 mg Intravenous Q6H    Pertinent Labs/Diagnostics: Currently hooked up to LTM EEG awaiting final reading  CSF  results for Katie Mosley, Katie Mosley (MRN [id number]) as of 10/02/2018 09:11  Ref. Range 10/01/2018 09:47  Appearance, CSF Latest Ref Range: CLEAR  CLEAR  Glucose, CSF Latest Ref Range: 40 - 70 mg/dL 73 (H)  RBC Count, CSF Latest Ref Range: 0 /cu mm 1 (H)  WBC, CSF Latest Ref Range: 0 - 5 /cu mm 1  Other Cells, CSF Unknown TOO FEW TO COUNT, SMEAR AVAILABLE FOR REVIEW  Color, CSF Latest Ref Range: COLORLESS  COLORLESS  Supernatant Unknown NOT INDICATED  Total  Protein, CSF Latest Ref Range: 15 - 45 mg/dL 23  Tube # Unknown 3   -Fungal culture pending -CSF culture with stain shows no growth -Cryptococcal antigen negative -Herpes simplex virus with PCR pending - CSF IgG pending -Initial miscellaneous [facility] test for TB on CSF was put in yesterday but for some reason this was DC'd by the lab.  I have again put this in with the [facility] lab to perform and asked to have it sent out stat  LTM EEG report for 10/02/2018: This was a moderately abnormal continuous video EEG due to generalized background slowing with triphasic waves, indicative of a toxic-metabolic encephalopathy pattern. No seizures or epileptiform discharges were seen.    BMP: 10/03/2018: NA: 154, K: 3.3 cl: 126, BG: 184, creatinine: 2.62 GFR:20   Laurey Morale, MSN, NP-C [facility] [phone]    Assessment: 78 year old female with encephalopathy in the context of pneumonia, hypothyroidism and worsened renal function.  1. The patient remains encephalopathic and obtunded to  the point where she only responds to pain.   2. CSF did not show any leukocytosis or other initial findings suggestive of bacterial infection. HSV 1 and 2 PCR negative. CSF IgG normal. Bacterial and fungal cultures negative to date.   3. Etiology for her AMS most likely toxic/metabolic   4. EEG with triphasic waves indicative of a toxic-metabolic encephalopathy. No seizures or epileptiform discharges seen. The EEG findings as well as clinical  features are consistent with her twitching being secondary to encephalopathy rather than seizure   Recommendations: - Continue supportive care -- Correct metabolic derangements  Electronically signed: Dr. Kerney Elbe

## 2018-10-03 NOTE — Progress Notes (Signed)
PROGRESS NOTE    Katie Mosley  BZJ:[id number] DOB: [birth year] DOA: 09/23/2018 PCP: Nolene Ebbs, MD   Brief Narrative:  As previously noted: Katie Mosley a 78 y.o.femalewithhistory of diastolic CHF last EF measured in March 2017 was 60 to 65% with grade 1 diastolic dysfunction, chronic hyponatremia, chronic kidney disease stage III, chronic anemia, hypothyroidism hypertension presents to the ER with complaints of having fatigue and shortness of breath for the last 1 week. Patient also noticed increasing swelling in the lower extremities. Has had some productive cough. Patient also has been feeling weak and fatigued easily. Has not had any dizziness fall or loss of consciousness.  ED Course:In the ER patient EKG shows sinus bradycardia with a heart rate around 54 bpm. Nonspecific ST-T changes. Labs reveal sodium of 123 which is markedly low from her previous. On exam patient has bilateral crepitations with lower extremity edema. Chest x-ray shows peripheral congestion. Patient's hemoglobin is at baseline. But does have some leukopenia. Patient is afebrile. Initially patient was given fluids in the ER by ER physician but given that patient has been likely CHF, now havediscontinued the fluids and start patient on Lasix.  09/27/2018: Patient transferred to [facility].patient's mental status continues to wax and wane. Patient is currently awake but nonverbal, confused, unable to also questions. Family's is always by the bedside. All questions and concerns have been addressed. Patient continues to decline despite extensive testing and interventions. Prognosis remains guarded.Neurology consulted as last resort ,recommed LTM EEG, and LP, transfer to [facility] per neuro recommendations    Assessment & Plan:   Principal Problem:   Sepsis ([place]) Active Problems:   Hyponatremia   Hypothyroidism   Hypothermia   Acute encephalopathy   Normocytic anemia   Leukopenia   CKD  (chronic kidney disease), stage IV ([place])   DNR (do not resuscitate) discussion   Acute respiratory failure ([place])   Acute encephalopathy, probably combined toxic and metabolic encephalopathy, now with jerking movements: As previously noted, patient remains encephalopathic and tenuous with altered mental status progressive weakness from home. Etiologythought to belikely multifactorial with underlying sepsis/pneumonia versus hyponatremia, versus adult failure to thrive. Patient was also noted to be hypothermic on admission. Chest x-ray notable for right-sided infiltrates. Urinalysis unrevealing. Blood cultures1/29 NTD, repeat 2/4 NTD --Continue with Nectar thick liquid as per speech therapy recommendations although the patient is not consuming much at all. completedtreatment for pneumonia with IV antibiotics. pro-calcitonin 2/3 was 0.36, will need to discuss with family possible PEG tube given patient's inability to take p.o. intake or follow commands --respiratory viral panel isnegative --Palliative care following, appreciate assistance, however family not prepared to make any decisions yet-addressed again on 2/5-full code, will be meeting with family again today for family discussion with all children to establish goals of care -on2/08/2018:patient was transferredto ICU/stepdown unit due to altered mental status. Ammonia 19 , lactic acid 1.2 , chest x-ray 2/1/20shows increased density in the right lung, abdominal x-ray concerning for possible ileus, cardiac BNP trending up, now 2549 , could be secondary to worsening renal function, ABG within normal limits.  -Given dysarthria,aphasia, son questionedwhether stroke needs to be ruled out,MRI of the brain was negative for stroke, EEG showed moderate to severe global slowing consistent with severe global encephalopathy but no acute seizure-like activity. LP was done no indication of infection, clear fluid, 1 white blood cell, Gram stain  negative, culture was negative. -on full abx vanc , amp, cef, acyclovir all day 3 Discussed with neurology no clear etiology  of encephalopathy.   Pneumonia, suspect gram-negative organism CXR from 1/29 with right-sided haziness consistent with likely infiltrate. --Afebrile, without leukocytosis --patient has been on antibiotics since 1/29, initially meropenem,thencefepime 2 g IV every 24 hours DC'dantibiotics2/3as patient has completed 5 day course and negativepro-calcitonin, respiratory panel normal white blood cell, and afebrile Suspect chronic aspiration as per speech therapy evaluation noted on 1/30 unfortunate unable to repeat eval secondary to patient's encephalopathy  Acute on chronic diastolic congestive heart failure: -discontinuedIV Lasix due to risingcreatinine/sodium  -patient appears euvolemic,,holdingLasix 40 twice a day by mouth, cxr in am - continue IV metoprolol  Follow renal function closely Instead Patient started on gentle IV fluids with half normal saline , patient is currently on room air and does not appear to be volume overloaded  Hypothyroidism Patient is on levothyroxine 50 mcg p.o. daily at home. Unclear if she was actually taking this as prescribed. TSH on admission elevated at 9. Free T4 normal at 1.39. repeat TSH is 5.717 --Continue IV Synthroid at 37.48m    Hypernatremia.    Mildly improved to 154.  Patient serum osmole 347,  serum urine osmole's are pending, changed hydration to D5 quarter normal at 100 mils per hour, recheck a BMP at 1500, encephalopathy predated sodium abnormalities   Chronic dysphagia Currently speech therapy recommends nectar thick liquids, GI evaluation but the patient is not medically stable for invasive procedures. She continues to have poor oral intake. Yesterday family mentioned initiating tube feeding.  We will meet with family today regarding PEG tube versus comfort care.    Anemia Etiology  likely of chronic disease. Normal MCV. Iron is 144 with a TIBC of 358, ferritin 139, folate 38. --Hemoglobin down to 6.7,Status post 1 unit of packed red blood cells, hemoglobin currently stable at 9.4> 8.6>8.2>8.2. check FOBT, may benefit from EGD due to chronic dysphagia ,whenstable  CKD stage III Creatinine continues to uptrend. Baseline creatinine between 1.3-1.6. creatinine has been trending up, now 2.21 >2.5 >2.7 >2.7 >2.7. Bladder scan without any significant retained urine. Etiology likely prerenal with dehydration/diuresis versus ATN,  -Lasix previously discontinued.  We will continue with D5 quarter normal monitor BMPs. -Trend creatinine  Hypertensive urgency On admission, patient was noted to have significant elevated blood pressures and was started on a clonidine patch. subsequently became hypotensive and her clonidine patch was removed prior to transfer.  Currently patient blood pressure being controlled with IV medications given her n.p.o. status and encephalopathy.  DVT prophylaxis: SCD/Compression stockings  Code Status: Full    Code Status Orders  (From admission, onward)         Start     Ordered   09/26/18 0946  Full code  Continuous     09/26/18 0947        Code Status History    Date Active Date Inactive Code Status Order ID Comments User Context   09/24/2018 1001 09/26/2018 0947 DNR [id number] SRoney Jaffe MD ED   09/24/2018 0423 09/24/2018 1001 Full Code [id number] KRise Patience MD ED   10/29/2015 0046 11/05/2015 1331 Full Code [id number] TEugenie Filler MD Inpatient   08/08/2015 0432 08/13/2015 1739 Full Code [id number] DEdwin Dada MD Inpatient   02/13/2015 0552 02/18/2015 1505 Full Code [id number] NIvor Costa MD Inpatient   10/31/2014 1332 11/03/2014 1820 Full Code [id number] TOrson Eva MD Inpatient     Family Communication: Son Disposition Plan:   Unclear at this time Consults called: None Admission status:  Inpatient   Consultants:   Neurology   Palliative care  Procedures:  Dg Chest 2 View  Result Date: 09/24/2018 CLINICAL DATA:  Shortness of breath. EXAM: CHEST - 2 VIEW COMPARISON:  06/22/2018 and 10/16/2017 FINDINGS: There is new pulmonary vascular congestion. There is new peripheral haziness in the right lung cyst pulmonary edema. No discrete effusions. Heart is within normal limits of size considering the AP portable technique. Aortic atherosclerosis.  No acute bone abnormalities. IMPRESSION: New pulmonary vascular congestion with slight peripheral pulmonary edema on the right. Electronically Signed   By: Lorriane Shire M.D.   On: 09/24/2018 04:12   Dg Abd 1 View  Result Date: 09/28/2018 CLINICAL DATA:  Ileus EXAM: ABDOMEN - 1 VIEW COMPARISON:  09/27/2018 FINDINGS: Nonobstructive bowel gas pattern. Mild residual contrast within ascending and transverse colon. No colonic dilatation. Degenerative changes of the lumbar spine. IMPRESSION: Unremarkable abdominal radiograph. Electronically Signed   By: Julian Hy M.D.   On: 09/28/2018 05:08   Dg Abd 1 View  Result Date: 09/27/2018 CLINICAL DATA:  Encounter for feeding tube placement. EXAM: ABDOMEN - 1 VIEW COMPARISON:  Radiograph of same day.  CT scan of June 22, 2018. FINDINGS: No small bowel dilatation is noted. Residual contrast is seen in the colon. Colonic dilatation is noted suggesting possible ileus. No abnormal calcifications are noted. IMPRESSION: Colonic dilatation is noted in left side of abdomen concerning for possible ileus. Electronically Signed   By: Marijo Conception, M.D.   On: 09/27/2018 12:21   Ct Head Wo Contrast  Result Date: 09/27/2018 CLINICAL DATA:  Neurological deficits with involuntary movements. EXAM: CT HEAD WITHOUT CONTRAST TECHNIQUE: Contiguous axial images were obtained from the base of the skull through the vertex without intravenous contrast. COMPARISON:  09/24/2018 FINDINGS: Brain: Diffuse cerebral atrophy.  Ventricular dilatation consistent with central atrophy. Low-attenuation changes in the deep white matter consistent with small vessel ischemia. Old parenchymal calcifications in the basal ganglia and left cerebellum without change. No mass effect or midline shift. No abnormal extra-axial fluid collections. Gray-white matter junctions are distinct. Basal cisterns are not effaced. No acute intracranial hemorrhage. Vascular: Prominent intracranial arterial vascular calcifications are present. Skull: Calvarium appears intact. No acute displaced fractures identified. Sinuses/Orbits: Paranasal sinuses and mastoid air cells are clear. Other: No significant change since prior study. IMPRESSION: No acute intracranial abnormalities. Chronic atrophy and small vessel ischemic changes. Electronically Signed   By: Lucienne Capers M.D.   On: 09/27/2018 03:56   Ct Head Wo Contrast  Result Date: 09/24/2018 CLINICAL DATA:  78 y/o  F; 3 days of weakness. EXAM: CT HEAD WITHOUT CONTRAST TECHNIQUE: Contiguous axial images were obtained from the base of the skull through the vertex without intravenous contrast. COMPARISON:  10/16/2017 CT head. FINDINGS: Brain: No evidence of acute infarction, hemorrhage, hydrocephalus, extra-axial collection or mass lesion/mass effect. Stable dystrophic calcifications in the basal ganglia and calcifications in the left cerebellar hemisphere. 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 Stable chronic microvascular ischemic changes and volume loss of the brain. Vascular: Calcific atherosclerosis of carotid siphons. No hyperdense vessel identified. Skull: Normal. Negative for fracture or focal lesion. Sinuses/Orbits: No acute finding. Other: Bilateral intra-ocular lens replacement. IMPRESSION: 1. No acute intracranial abnormality identified. 2. Stable chronic microvascular ischemic changes and volume loss of the brain. Stable small chronic lacunar infarct in the right caudate nucleus.  Electronically Signed   By: Kristine Garbe M.D.   On: 09/24/2018 05:15   Mr Brain Wo Contrast  Result  Date: 09/29/2018 CLINICAL DATA:  Acute encephalopathy. Jerking movements. Progressive weakness. EXAM: MRI HEAD WITHOUT CONTRAST TECHNIQUE: Multiplanar, multiecho pulse sequences of the brain and surrounding structures were obtained without intravenous contrast. COMPARISON:  Head CT 09/27/2018 and MRI 08/08/2015 FINDINGS: Brain: There is no evidence of acute infarct, intracranial hemorrhage, mass, midline shift, or extra-axial fluid collection. Patchy to confluent T2 hyperintensities in the cerebral white matter are slightly progressed from the prior MRI and are nonspecific but compatible with moderate chronic small vessel ischemic disease. Mild T2 hyperintensity in the basal ganglia and thalami is similar to the prior MRI. 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 There is a small remote insult in the left cerebellum with associated calcification on CT. Vascular: Major intracranial vascular flow voids are preserved. Skull and upper cervical spine: Unremarkable bone marrow signal. Sinuses/Orbits: Bilateral cataract extraction. Clear paranasal sinuses. Trace mastoid effusions. Other: None. IMPRESSION: 1. No acute intracranial abnormality. 2. Moderate chronic small vessel ischemic disease and cerebral atrophy. Electronically Signed   By: Logan Bores M.D.   On: 09/29/2018 13:22   Dg Chest Port 1 View  Result Date: 09/30/2018 CLINICAL DATA:  Fever. EXAM: PORTABLE CHEST 1 VIEW COMPARISON:  Chest x-ray 09/27/2018. FINDINGS: Cardiomegaly with bilateral interstitial prominence. Improved aeration from prior exam. Persistent right lower lobe alveolar infiltrate. This could represent asymmetric pulmonary edema and/or pneumonia. Small bilateral pleural effusions. IMPRESSION: 1. Cardiomegaly with bilateral interstitial prominence, improved aeration from prior exam.  Findings suggesting improving CHF. Small bilateral pleural effusions. 2. Persistent right base infiltrate. This could represent asymmetric pulmonary edema and/or pneumonia. Similar findings on prior exam. Electronically Signed   By: [place]   On: 09/30/2018 13:34   Dg Chest Port 1 View  Result Date: 09/27/2018 CLINICAL DATA:  Acute respiratory failure. EXAM: PORTABLE CHEST 1 VIEW COMPARISON:  Chest x-ray dated September 25, 2018. FINDINGS: The patient is rotated to the left, limiting evaluation. Stable cardiomegaly. Unchanged mild interstitial edema, small bilateral pleural effusions, and bibasilar atelectasis. Apparent increased density at the right lung base is likely due to rotation. No pneumothorax. No acute osseous abnormality. IMPRESSION: 1. Unchanged mild interstitial edema, small bilateral pleural effusions, and bibasilar atelectasis. Electronically Signed   By: Titus Dubin M.D.   On: 09/27/2018 10:33   Dg Chest Port 1 View  Result Date: 09/25/2018 CLINICAL DATA:  Follow-up swallow study. EXAM: PORTABLE CHEST 1 VIEW COMPARISON:  Radiographs of September 24, 2018. FINDINGS: Stable cardiomegaly is noted. Atherosclerosis of thoracic aorta is noted. No pneumothorax is noted. Minimal bibasilar subsegmental atelectasis is noted with minimal pleural effusions. Bony thorax is unremarkable. Triangular area of increased density is seen in the region of the upper thoracic spine; is uncertain if this represents retained contrast from prior swallowing study. Rounded barium tablet is not clearly identified. IMPRESSION: Minimal bibasilar subsegmental atelectasis is noted with minimal pleural effusions. Triangular area of increased density is seen projected over upper thoracic spine which potentially may represent residual contrast from prior swallowing study. Aortic Atherosclerosis (ICD10-I70.0). Electronically Signed   By: Marijo Conception, M.D.   On: 09/25/2018 13:18   Dg Swallowing Func-speech  Pathology  Result Date: 09/25/2018 Objective Swallowing Evaluation: Type of Study: MBS-Modified Barium Swallow Study  Patient Details Name: SIMREN POPSON MRN: [id number] Date of Birth: 1940-12-12 Today's Date: 09/25/2018 Time: SLP Start Time (ACUTE ONLY): 0750 -SLP Stop Time (ACUTE ONLY): 0804 SLP Time Calculation (min) (ACUTE ONLY): 14 min Past Medical History: Past  Medical History: Diagnosis Date . Adrenal insufficiency ([place])  . Adrenal insufficiency ([place])   Dx'd [facility] 05/2015 . Asthma  . CHF (congestive heart failure) ([place])  . GERD (gastroesophageal reflux disease)  . Glaucoma  . Headache  . HTN (hypertension)  . Hypertension  . Hypothyroidism  . Normocytic anemia 09/07/2018 . Shortness of breath dyspnea  . Thyroid disease   not know in detail Past Surgical History: Past Surgical History: Procedure Laterality Date . APPENDECTOMY   . BACK SURGERY   . CAPSULOTOMY  08/23/2011  Procedure: MINOR CAPSULOTOMY;  Surgeon: Myrtha Mantis.;  Location: [place];  Service: Ophthalmology;  Laterality: Left;  YAG Capsulotomy Left Eye . ESOPHAGOGASTRODUODENOSCOPY N/A 03/17/2013  Procedure: ESOPHAGOGASTRODUODENOSCOPY (EGD);  Surgeon: Irene Shipper, MD;  Location: [facility] ENDOSCOPY;  Service: Endoscopy;  Laterality: N/A; . LEFT CATARACT EXTRACTION    2007 . RIGHT CATARACT EXTRACTION    2006 HPI: pt is a 78 yo female adm to [facility] with AMS x3 days, CT head + basal ganglia and left cerebellar calcification, 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  Pt with h/o CHF, renal dysfunction. Swallow evauluation ordered as pt had difficulty swallowing a pill this am.  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   Subjective: pt awake in chair Assessment / Plan / Recommendation CHL IP CLINICAL IMPRESSIONS 09/25/2018 Clinical Impression Pt's swallow function was inconsistent throughout MBS - neck extension posture significantly worsened pharyngeal contraction and thus resulted in gross residuals  of puree/thin. Oral deficits significant mostly with liquids resulting in liquids spilling into pharynx not controlled and resulting in aspiration before the swallow with nectar.  Aspiration after the swallow from pyriform sinus spillage did elicit a delayed cough but not adequately strong to clear pharynx.  Chin tuck posture improved airway protection significantly and prevented aspiration as well as decreased pharyngeal residuals.  Cued dry swallows helpful to clear oral residuals.  Of note, pt appeared with significant stasis in proximal esophagus without sensation *just below UES without awareness.  Pudding consistency with tablet did not clear this area despite follow up liquid swallows.  Dependent on family's goal for pt, consideration for GI referral recommended given son's report of chronicity of dysphagia for "a long time".  Recommend to start full liquid diet *nectar* and allow thin water between meals. Use straw to faciliate chin down posture for pt.  Son present and wishes to interpret *not ipad interpreter* therefore allowed in xray.  SLP will follow up for pt tolerance, family education.   SLP Visit Diagnosis Dysphagia, oropharyngeal phase (R13.12);Dysphagia, pharyngoesophageal phase (R13.14) Attention and concentration deficit following -- Frontal lobe and executive function deficit following -- Impact on safety and function Moderate aspiration risk;Risk for inadequate nutrition/hydration   CHL IP TREATMENT RECOMMENDATION 09/25/2018 Treatment Recommendations Therapy as outlined in treatment plan below   Prognosis 09/25/2018 Prognosis for Safe Diet Advancement Fair Barriers to Reach Goals Time post onset Barriers/Prognosis Comment -- CHL IP DIET RECOMMENDATION 09/25/2018 SLP Diet Recommendations Nectar thick liquid Liquid Administration via -- Medication Administration Crushed with puree Compensations Slow rate;Small sips/bites Postural Changes --   CHL IP OTHER RECOMMENDATIONS 09/25/2018 Recommended  Consults Consider GI evaluation Oral Care Recommendations Oral care before and after PO Other Recommendations Order thickener from pharmacy;Have oral suction available   CHL IP FOLLOW UP RECOMMENDATIONS 09/25/2018 Follow up Recommendations (No Data)   CHL IP FREQUENCY AND DURATION 09/25/2018 Speech Therapy Frequency (ACUTE ONLY) min 2x/week Treatment Duration 1  week      CHL IP ORAL PHASE 09/25/2018 Oral Phase Impaired Oral - Pudding Teaspoon -- Oral - Pudding Cup -- Oral - Honey Teaspoon -- Oral - Honey Cup -- Oral - Nectar Teaspoon Weak lingual manipulation;Decreased bolus cohesion;Reduced posterior propulsion;Premature spillage Oral - Nectar Cup -- Oral - Nectar Straw Weak lingual manipulation;Decreased bolus cohesion;Reduced posterior propulsion;Premature spillage;Delayed oral transit Oral - Thin Teaspoon Decreased bolus cohesion;Weak lingual manipulation;Reduced posterior propulsion;Premature spillage;Lingual/palatal residue;Delayed oral transit Oral - Thin Cup -- Oral - Thin Straw Reduced posterior propulsion;Weak lingual manipulation;Decreased bolus cohesion;Premature spillage;Lingual/palatal residue;Delayed oral transit Oral - Puree Decreased bolus cohesion;Weak lingual manipulation;Lingual pumping;Premature spillage;Lingual/palatal residue;Delayed oral transit Oral - Mech Soft Decreased bolus cohesion;Premature spillage;Delayed oral transit Oral - Regular -- Oral - Multi-Consistency -- Oral - Pill Decreased bolus cohesion;Premature spillage;Weak lingual manipulation;Lingual pumping;Delayed oral transit Oral Phase - Comment --  CHL IP PHARYNGEAL PHASE 09/25/2018 Pharyngeal Phase Impaired Pharyngeal- Pudding Teaspoon -- Pharyngeal -- Pharyngeal- Pudding Cup -- Pharyngeal -- Pharyngeal- Honey Teaspoon -- Pharyngeal -- Pharyngeal- Honey Cup -- Pharyngeal -- Pharyngeal- Nectar Teaspoon -- Pharyngeal -- Pharyngeal- Nectar Cup -- Pharyngeal -- Pharyngeal- Nectar Straw WFL Pharyngeal -- Pharyngeal- Thin Teaspoon  Penetration/Aspiration before swallow Pharyngeal Material enters airway, passes BELOW cords without attempt by patient to eject out (silent aspiration) Pharyngeal- Thin Cup -- Pharyngeal -- Pharyngeal- Thin Straw Penetration/Apiration after swallow;Pharyngeal residue - valleculae;Pharyngeal residue - pyriform Pharyngeal Material enters airway, passes BELOW cords without attempt by patient to eject out (silent aspiration);Material enters airway, passes BELOW cords and not ejected out despite cough attempt by patient Pharyngeal- Puree Reduced tongue base retraction;Reduced airway/laryngeal closure;Reduced laryngeal elevation;Pharyngeal residue - valleculae;Pharyngeal residue - pyriform;Reduced pharyngeal peristalsis Pharyngeal -- Pharyngeal- Mechanical Soft Reduced tongue base retraction;Pharyngeal residue - valleculae Pharyngeal -- Pharyngeal- Regular -- Pharyngeal -- Pharyngeal- Multi-consistency -- Pharyngeal -- Pharyngeal- Pill Pharyngeal residue - valleculae Pharyngeal -- Pharyngeal Comment pt with gross residuals in pharynx intermittently - most notably with head extended upward with swallow, dry swallow attempts are essentially not initiated as very minimal movement observed, aspiration of thin occured after the swallow as pyriform sinus residual spill into open airway - posterior trach,   CHL IP CERVICAL ESOPHAGEAL PHASE 09/25/2018 Cervical Esophageal Phase Impaired Pudding Teaspoon -- Pudding Cup -- Honey Teaspoon -- Honey Cup -- Nectar Teaspoon -- Nectar Cup -- Nectar Straw -- Thin Teaspoon -- Thin Cup -- Thin Straw -- Puree -- Mechanical Soft -- Regular -- Multi-consistency -- Pill -- Cervical Esophageal Comment pt appeared with significant residuals below UES WITHOUT awareness, much more prominent with puree than liquids Macario Golds 09/25/2018, 10:45 AM Luanna Salk, MS [facility] SLP Acute Rehab Services Pager [phone] Office [phone]              Vas [place] Lower Extremity Venous (dvt)  Result  Date: 09/24/2018  Lower Venous Study Indications: Edema.  Performing Technologist: Oliver Hum RVT  Examination Guidelines: A complete evaluation includes B-mode imaging, spectral Doppler, color Doppler, and power Doppler as needed of all accessible portions of each vessel. Bilateral testing is considered an integral part of a complete examination. Limited examinations for reoccurring indications may be performed as noted.  Right Venous Findings: +---------+---------------+---------+-----------+----------+-------+          CompressibilityPhasicitySpontaneityPropertiesSummary +---------+---------------+---------+-----------+----------+-------+ CFV      Full           Yes      Yes                          +---------+---------------+---------+-----------+----------+-------+  SFJ      Full                                                 +---------+---------------+---------+-----------+----------+-------+ FV Prox  Full                                                 +---------+---------------+---------+-----------+----------+-------+ FV Mid   Full                                                 +---------+---------------+---------+-----------+----------+-------+ FV DistalFull                                                 +---------+---------------+---------+-----------+----------+-------+ PFV      Full                                                 +---------+---------------+---------+-----------+----------+-------+ POP      Full           Yes      Yes                          +---------+---------------+---------+-----------+----------+-------+ PTV      Full                                                 +---------+---------------+---------+-----------+----------+-------+ PERO     Full                                                 +---------+---------------+---------+-----------+----------+-------+  Left Venous Findings:  +---------+---------------+---------+-----------+----------+-------+          CompressibilityPhasicitySpontaneityPropertiesSummary +---------+---------------+---------+-----------+----------+-------+ CFV      Full           Yes      Yes                          +---------+---------------+---------+-----------+----------+-------+ SFJ      Full                                                 +---------+---------------+---------+-----------+----------+-------+ FV Prox  Full                                                 +---------+---------------+---------+-----------+----------+-------+  FV Mid   Full                                                 +---------+---------------+---------+-----------+----------+-------+ FV DistalFull                                                 +---------+---------------+---------+-----------+----------+-------+ PFV      Full                                                 +---------+---------------+---------+-----------+----------+-------+ POP      Full           Yes      Yes                          +---------+---------------+---------+-----------+----------+-------+ PTV      Full                                                 +---------+---------------+---------+-----------+----------+-------+ PERO     Full                                                 +---------+---------------+---------+-----------+----------+-------+    Summary: Right: There is no evidence of deep vein thrombosis in the lower extremity. No cystic structure found in the popliteal fossa. Left: There is no evidence of deep vein thrombosis in the lower extremity. No cystic structure found in the popliteal fossa.  *See table(s) above for measurements and observations. Electronically signed by Monica Martinez MD on 09/24/2018 at 3:52:06 PM.    Final      Antimicrobials:   VANC PHARM DOSING,   CEFTRIAXONE 2Q12H,   AMPICILIIN 2Q5H,    ACYCLOVIR 500Q24H   ALL DAY #4 STARTED 2/4    Subjective: Patient remains encephalopathic.  Met with son who is bedside this morning plan family meeting at 2 PM.  No change in patient's clinical status overnight.  Objective: Vitals:   10/03/18 0024 10/03/18 0615 10/03/18 0700 10/03/18 0845  BP: (!) 171/57 (!) 166/65 (!) 177/64   Pulse:   61   Resp: 17  16 15   Temp: (!) 97.5 F (36.4 C) 97.7 F (36.5 C) 97.7 F (36.5 C)   TempSrc: Axillary Temporal Axillary   SpO2: 100%  100% 100%  Weight:      Height:        Intake/Output Summary (Last 24 hours) at 10/03/2018 1101 Last data filed at 10/03/2018 0000 Gross per 24 hour  Intake 1106.44 ml  Output 800 ml  Net 306.44 ml   Filed Weights   09/27/18 0500 09/28/18 0500 10/01/18 0500  Weight: 52.4 kg 52 kg 52.9 kg    Examination:  General exam: Appears calm and comfortable  Respiratory system: Clear to auscultation. Respiratory effort normal. Cardiovascular  system: S1 & S2 heard, RRR. No JVD, murmurs, rubs, gallops or clicks. No pedal edema. Gastrointestinal system: Abdomen is nondistended, soft and nontender. No organomegaly or masses felt. Normal bowel sounds heard. Central nervous system: Alert and oriented. No focal neurological deficits. Extremities: Symmetric 5 x 5 power. Skin: No rashes, lesions or ulcers Psychiatry: Judgement and insight appear normal. Mood & affect appropriate.     Data Reviewed: I have personally reviewed following labs and imaging studies  CBC: Recent Labs  Lab 09/26/18 2239 09/27/18 0411 09/28/18 0342 09/29/18 0354 10/02/18 0815  WBC  --  10.9* 7.1 4.8 4.2  NEUTROABS  --   --  5.5 3.5 2.8  HGB 9.4* 8.6* 8.2* 8.2* 8.4*  HCT 28.1* 26.5* 25.1* 25.5* 26.6*  MCV  --  95.3 95.8 97.3 95.3  PLT  --  157 142* 167 341   Basic Metabolic Panel: Recent Labs  Lab 09/27/18 0411  09/28/18 0342 09/29/18 0354 09/30/18 0258 10/01/18 0948 10/02/18 0815 10/02/18 1714 10/03/18 0804  NA 136   136   < > 140  140 145 148* 152* 156* 156* 154*  K 4.3  4.3   < > 4.0  4.1 3.8 3.7 3.6 3.7 3.5 3.3*  CL 111  111   < > 113*  113* 117* 120* 122* 122* 123* 126*  CO2 17*  16*   < > 19*  19* 17* 17* 17* 18* 18* 19*  GLUCOSE 160*  160*   < > 100*  102* 71 100* 106* 112* 145* 184*  BUN 41*  42*   < > 45*  42* 48* 62* 70* 71* 76* 75*  CREATININE 2.18*  2.20*   < > 1.98*  1.92* 2.21* 2.55* 2.74* 2.63* 2.71* 2.62*  CALCIUM 7.4*  7.4*   < > 8.0*  8.0* 8.4* 8.6* 8.5* 8.3* 7.9* 8.0*  MG 1.7  --  2.4 2.3  --   --   --   --   --   PHOS  --   --  5.0* 4.6  --   --   --   --   --    < > = values in this interval not displayed.   GFR: Estimated Creatinine Clearance: 13.2 mL/min (A) (by C-G formula based on SCr of 2.62 mg/dL (H)). Liver Function Tests: Recent Labs  Lab 09/27/18 0411 09/28/18 0342 09/29/18 0354 09/30/18 0258 10/01/18 0948  AST 59*  --   --  45* 43*  ALT 25  --   --  25 24  ALKPHOS 64  --   --  59 57  BILITOT 0.6  --   --  1.4* 0.9  PROT 5.9*  --   --  6.1* 5.9*  ALBUMIN 2.3* 2.5* 2.3* 2.4* 2.1*   No results for input(s): LIPASE, AMYLASE in the last 168 hours. Recent Labs  Lab 09/27/18 0926  AMMONIA 19   Coagulation Profile: No results for input(s): INR, PROTIME in the last 168 hours. Cardiac Enzymes: No results for input(s): CKTOTAL, CKMB, CKMBINDEX, TROPONINI in the last 168 hours. BNP (last 3 results) No results for input(s): PROBNP in the last 8760 hours. HbA1C: No results for input(s): HGBA1C in the last 72 hours. CBG: Recent Labs  Lab 09/27/18 0248 09/27/18 0904  GLUCAP 175* 128*   Lipid Profile: No results for input(s): CHOL, HDL, LDLCALC, TRIG, CHOLHDL, LDLDIRECT in the last 72 hours. Thyroid Function Tests: No results for input(s): TSH, T4TOTAL, FREET4, T3FREE, THYROIDAB in the last  72 hours. Anemia Panel: No results for input(s): VITAMINB12, FOLATE, FERRITIN, TIBC, IRON, RETICCTPCT in the last 72 hours. Sepsis Labs: Recent Labs  Lab  09/27/18 0926 09/27/18 1249 09/29/18 0354  PROCALCITON  --   --  0.36  LATICACIDVEN 1.2 1.1  --     Recent Results (from the past 240 hour(s))  Culture, blood (Routine X 2) w Reflex to ID Panel     Status: None   Collection Time: 09/24/18  3:18 PM  Result Value Ref Range Status   Specimen Description   Final    BLOOD RIGHT ARM Performed at [facility] [address], [place], [place] [address]    Special Requests   Final    BOTTLES DRAWN AEROBIC AND ANAEROBIC Blood Culture adequate volume Performed at [facility] [address], [place], [place] [address]    Culture   Final    NO GROWTH 5 DAYS Performed at [facility], [place] [address] [place], [place] [address]    Report Status 09/30/2018 FINAL  Final  Culture, blood (Routine X 2) w Reflex to ID Panel     Status: None   Collection Time: 09/24/18 11:03 PM  Result Value Ref Range Status   Specimen Description BLOOD RIGHT HAND  Final   Special Requests   Final    BOTTLES DRAWN AEROBIC AND ANAEROBIC Blood Culture adequate volume   Culture   Final    NO GROWTH 5 DAYS Performed at [facility], [place] [address] [place], [place] [address]    Report Status 09/30/2018 FINAL  Final  MRSA PCR Screening     Status: None   Collection Time: 09/27/18  9:44 AM  Result Value Ref Range Status   MRSA by PCR NEGATIVE NEGATIVE Final    Comment:        The GeneXpert MRSA Assay (FDA approved for NASAL specimens only), is one component of a comprehensive MRSA colonization surveillance program. It is not intended to diagnose MRSA infection nor to guide or monitor treatment for MRSA infections. Performed at [facility], [place] [address], [place], [place] [address]   Respiratory Panel by PCR     Status: None   Collection Time: 09/29/18  2:22 PM  Result Value Ref Range Status   Adenovirus NOT DETECTED NOT DETECTED Final   Coronavirus 229E NOT DETECTED NOT DETECTED  Final    Comment: (NOTE) The Coronavirus on the Respiratory Panel, DOES NOT test for the novel  Coronavirus (2019 nCoV)    Coronavirus HKU1 NOT DETECTED NOT DETECTED Final   Coronavirus NL63 NOT DETECTED NOT DETECTED Final   Coronavirus OC43 NOT DETECTED NOT DETECTED Final   Metapneumovirus NOT DETECTED NOT DETECTED Final   Rhinovirus / Enterovirus NOT DETECTED NOT DETECTED Final   Influenza A NOT DETECTED NOT DETECTED Final   Influenza B NOT DETECTED NOT DETECTED Final   Parainfluenza Virus 1 NOT DETECTED NOT DETECTED Final   Parainfluenza Virus 2 NOT DETECTED NOT DETECTED Final   Parainfluenza Virus 3 NOT DETECTED NOT DETECTED Final   Parainfluenza Virus 4 NOT DETECTED NOT DETECTED Final   Respiratory Syncytial Virus NOT DETECTED NOT DETECTED Final   Bordetella pertussis NOT DETECTED NOT DETECTED Final   Chlamydophila pneumoniae NOT DETECTED NOT DETECTED Final   Mycoplasma pneumoniae NOT DETECTED NOT DETECTED Final    Comment: Performed at [facility], [place]. [address] [place], [place] [address]  Culture, blood (Routine X 2) w  Reflex to ID Panel     Status: None (Preliminary result)   Collection Time: 09/30/18 11:36 AM  Result Value Ref Range Status   Specimen Description   Final    BLOOD LEFT HAND Performed at [facility] [address], [place], [place] [address]    Special Requests   Final    BOTTLES DRAWN AEROBIC AND ANAEROBIC Blood Culture adequate volume Performed at [facility] [address] [place], [place] [address]    Culture   Final    NO GROWTH 3 DAYS Performed at [facility], [place] [address] [place], [place] [address]    Report Status PENDING  Incomplete  Culture, blood (Routine X 2) w Reflex to ID Panel     Status: None (Preliminary result)   Collection Time: 09/30/18 11:37 AM  Result Value Ref Range Status   Specimen Description   Final    BLOOD RIGHT HAND Performed at [facility] [address] [place], [place] [address]    Special Requests   Final    BOTTLES DRAWN AEROBIC AND ANAEROBIC Blood Culture adequate volume Performed at [facility] [address] [place], [place] [address]    Culture   Final    NO GROWTH 3 DAYS Performed at [facility], [place] [address], [place], [place] [address]    Report Status PENDING  Incomplete  Urine Culture     Status: None   Collection Time: 09/30/18 12:38 PM  Result Value Ref Range Status   Specimen Description   Final    URINE, RANDOM Performed at [facility] [address], [place], [place] [address]    Special Requests   Final    NONE Performed at [facility], [place] [address], [place], [place] [address]    Culture   Final    NO GROWTH Performed at [facility], [place] [address], [place], [place] [address]    Report Status 10/01/2018 FINAL  Final  CSF culture with Stat gram stain     Status: None (Preliminary result)   Collection Time: 10/01/18  9:55 AM  Result Value Ref Range Status   Specimen Description CSF  Final   Special Requests Normal  Final   Gram Stain   Final    CYTOSPIN SMEAR WBC PRESENT, PREDOMINANTLY MONONUCLEAR NO ORGANISMS SEEN    Culture   Final    NO GROWTH 2 DAYS Performed at [facility], [place] [address] [place], [place] [address]    Report Status PENDING  Incomplete         Radiology Studies: No results found.      Scheduled Meds: . budesonide (PULMICORT) nebulizer solution  0.25 mg Nebulization BID  . chlorhexidine  15 mL Mouth Rinse BID  . cloNIDine  0.3 mg Transdermal Weekly  . ipratropium-albuterol  3 mL Nebulization BID  . levothyroxine  37.5 mcg Intravenous Daily  . LORazepam  2 mg Intravenous Once  . mouth rinse  15 mL Mouth Rinse q12n4p  . metoprolol tartrate  10 mg Intravenous Q6H   Continuous Infusions: . dextrose 5 % and 0.45% NaCl 100 mL/hr at 10/03/18 0047     LOS: 9 days     Time spent: 58 min    Nicolette Bang, MD [facility]  If 7PM-7AM, please contact night-coverage  10/03/2018, 11:01 AM

## 2018-10-03 NOTE — Progress Notes (Addendum)
  Speech Language Pathology Treatment: Dysphagia  Patient Details Name: Katie Mosley MRN: [id number] DOB: 07-Jul-1941 Today's Date: 10/03/2018 Time: 1205-1237 SLP Time Calculation (min) (ACUTE ONLY): 32 min  Assessment / Plan / Recommendation Clinical Impression  SLP assessment to determine readiness for po and to educate son to importance of oral care due to bacterial accumulation possible.  RN reported concern for condition of pt's mouth- SLP noted dried blood adhered below anterior tongue and adhered to hard palate.  Extensive oral care provided using toothette with warm water and oral suction *turned down to medium from max* to remove dried bloody secretions.    Son assisted to hold pt's mouth open and seal lips with total tactile cues to allow oral care. SLP able to clear palate of bloody secretions but anterior sulci may have ulceration (whitish  present and thus did not remove. Asked RN to provide humidification to oxygen to help with dry mucousa.  Obtained oral kit for NPO pt's and provided son with toothettes to moisten mouth as needed only when pt fully alert and sitting upright.    Posted sign with precautions for oral suction and advised RN to recommendations for oral care - gentle and frequent to aid clearance.  ? If pt with low platelets earlier in hospitalization could have contributed to bleeding.     Advised son that at this time no SLP follow needed up since eduction completed and pt remains too lethargic for intake or po trials. Adequate oral care needed regardless of nutrition and son educated!   Please reconsult if pt's mentation improves.    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  Pt with h/o CHF, renal dysfunction. 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  Results of MBS indicated oropharyngeal phase  and pharyngoesophageal phase dysphagia.        SLP Plan  Discharge SLP treatment due to (comment)(lack of improvement)       Recommendations  Diet recommendations: NPO                Oral Care Recommendations: Oral care QID Follow up Recommendations: None SLP Visit Diagnosis: Dysphagia, oropharyngeal phase (R13.12) Plan: Discharge SLP treatment due to (comment)(lack of improvement)       GO               Luanna Salk, MS [facility] SLP Acute Rehab Services Pager [phone] Office [phone]  Macario Golds 10/03/2018, 1:48 PM

## 2018-10-04 ENCOUNTER — Inpatient Hospital Stay (HOSPITAL_COMMUNITY): Payer: Medicare Other

## 2018-10-04 LAB — CBC WITH DIFFERENTIAL/PLATELET
Abs Immature Granulocytes: 0.02 10*3/uL (ref 0.00–0.07)
BASOS ABS: 0 10*3/uL (ref 0.0–0.1)
Basophils Relative: 1 %
Eosinophils Absolute: 0.3 10*3/uL (ref 0.0–0.5)
Eosinophils Relative: 6 %
HCT: 28.5 % — ABNORMAL LOW (ref 36.0–46.0)
Hemoglobin: 8.9 g/dL — ABNORMAL LOW (ref 12.0–15.0)
Immature Granulocytes: 1 %
Lymphocytes Relative: 19 %
Lymphs Abs: 0.8 10*3/uL (ref 0.7–4.0)
MCH: 30.3 pg (ref 26.0–34.0)
MCHC: 31.2 g/dL (ref 30.0–36.0)
MCV: 96.9 fL (ref 80.0–100.0)
Monocytes Absolute: 0.4 10*3/uL (ref 0.1–1.0)
Monocytes Relative: 9 %
Neutro Abs: 2.8 10*3/uL (ref 1.7–7.7)
Neutrophils Relative %: 64 %
Platelets: 184 10*3/uL (ref 150–400)
RBC: 2.94 MIL/uL — ABNORMAL LOW (ref 3.87–5.11)
RDW: 17.2 % — AB (ref 11.5–15.5)
WBC: 4.3 10*3/uL (ref 4.0–10.5)
nRBC: 0 % (ref 0.0–0.2)

## 2018-10-04 LAB — COMPREHENSIVE METABOLIC PANEL
ALT: 17 U/L (ref 0–44)
AST: 31 U/L (ref 15–41)
Albumin: 1.9 g/dL — ABNORMAL LOW (ref 3.5–5.0)
Alkaline Phosphatase: 53 U/L (ref 38–126)
Anion gap: 10 (ref 5–15)
BILIRUBIN TOTAL: 0.9 mg/dL (ref 0.3–1.2)
BUN: 69 mg/dL — ABNORMAL HIGH (ref 8–23)
CALCIUM: 8.2 mg/dL — AB (ref 8.9–10.3)
CO2: 17 mmol/L — ABNORMAL LOW (ref 22–32)
Chloride: 123 mmol/L — ABNORMAL HIGH (ref 98–111)
Creatinine, Ser: 2.38 mg/dL — ABNORMAL HIGH (ref 0.44–1.00)
GFR calc Af Amer: 22 mL/min — ABNORMAL LOW (ref >60)
GFR, EST NON AFRICAN AMERICAN: 19 mL/min — AB (ref >60)
Glucose, Bld: 156 mg/dL — ABNORMAL HIGH (ref 70–99)
Potassium: 3.8 mmol/L (ref 3.5–5.1)
Sodium: 150 mmol/L — ABNORMAL HIGH (ref 135–145)
Total Protein: 5.6 g/dL — ABNORMAL LOW (ref 6.5–8.1)

## 2018-10-04 LAB — CSF CULTURE W GRAM STAIN
Culture: NO GROWTH
Special Requests: NORMAL

## 2018-10-04 MED ORDER — FUROSEMIDE 10 MG/ML IJ SOLN
20.0000 mg | Freq: Once | INTRAMUSCULAR | Status: AC
  Administered 2018-10-04: 20 mg via INTRAVENOUS
  Filled 2018-10-04: qty 4

## 2018-10-04 MED ORDER — POTASSIUM CL IN DEXTROSE 5% 20 MEQ/L IV SOLN
20.0000 meq | INTRAVENOUS | Status: DC
  Administered 2018-10-04 – 2018-10-07 (×3): 20 meq via INTRAVENOUS
  Filled 2018-10-04 (×6): qty 1000

## 2018-10-04 NOTE — Progress Notes (Signed)
PROGRESS NOTE    Katie Mosley  YKZ:[id number] DOB: [birth year] DOA: 09/23/2018 PCP: Katie Ebbs, MD   Brief Narrative:  As previously noted:Katie B Sheikhunais a 78 y.o.femalewithhistory of diastolic CHF last EF measured in March 2017 was 60 to 65% with grade 1 diastolic dysfunction, chronic hyponatremia, chronic kidney disease stage III, chronic anemia, hypothyroidism hypertension presents to the ER with complaints of having fatigue and shortness of breath for the last 1 week. Patient also noticed increasing swelling in the lower extremities. Has had some productive cough. Patient also has been feeling weak and fatigued easily. Has not had any dizziness fall or loss of consciousness.  Patient was admitted to [facility] long.  Work-up is been negative for encephalopathic process.  She was subsequently transferred to [facility] for further evaluation as noted below.  ED Course:In the ER patient EKG shows sinus bradycardia with a heart rate around 54 bpm. Nonspecific ST-T changes. Labs reveal sodium of 123 which is markedly low from her previous. On exam patient has bilateral crepitations with lower extremity edema. Chest x-ray shows peripheral congestion. Patient's hemoglobin is at baseline. But does have some leukopenia. Patient is afebrile. Initially patient was given fluids in the ER by ER physician but given that patient has been likely CHF, now havediscontinued the fluids and start patient on Lasix.  09/27/2018: Patient transferred to [facility].patient's mental status continues to wax and wane. Patient is currently awake but nonverbal, confused, unable to also questions. Family's is always by the bedside. All questions and concerns have been addressed. Patient continues to decline despite extensive testing and interventions. Prognosis remains guarded.Neurology consulted as last resort ,recommed LTM EEG, and LP, transfer to [facility] per neuro recommendations   Assessment &  Plan:   Principal Problem:   Sepsis ([place]) Active Problems:   Hyponatremia   Hypothyroidism   Hypothermia   Acute encephalopathy   Normocytic anemia   Leukopenia   CKD (chronic kidney disease), stage IV ([place])   DNR (do not resuscitate) discussion   Acute respiratory failure ([place])   Acute encephalopathy, probably combined toxic and metabolic encephalopathy. Patient remains encephalopathic and tenuous with altered mental status.  Etiology is thought to be multifocal initially to include underlying infection complicated hyponatremia and adult failure to thrive.  She was initially seen by speech at [facility] long with recommendations for nectar thick liquids but patient now is not interacting unable to take p.o. medications.  Had extensive family discussion day with daughter who is a physician in [place] as well as family members at bedside.  Discussed patient's work-up which to date has shown negative respiratory virus panel CT and MRI without acute findings although atrophy identified, EEG showing encephalopathic process, LP without evidence of infection with clear fluid and 1 white blood cell as well as negative Gram stain and culture.  Discussed plan of care patient family is undecided at this point time and wanted proceed with providing care until daughter flies in from the [place] tomorrow for family meeting Monday for possible PEG tube.  Pneumonia suspect gram-negative organism.  As previously noted chest x-ray from 1/29 showed right-sided haziness consistent with infiltrate patient received meropenem then cefepime.  She completed a 5-day course with a negative procalcitonin negative respiratory panel normal white blood cell count afebrile.  No evidence of infection and  Hypothyroid continuing IV Synthroid at this point time  Hypernatremia mildly improved with hydration D5 quarter normal down to 150.  Patient serum osmole's were 347 prior to hydration.  Unfortunately because  of patient's CHF with  diastolic dysfunction chest x-ray showing edema will have to discontinue fluids today,  1 dose of Lasix.  Chronic dysphasia.  Suspect is underlying element of aspiration.  Patient was seen by speech as noted and they recommended nectar thick liquids unfortunately patient unable to tolerate any p.o. intake now discussed with the family PEG tube they want to wait till Monday to make a decision.  Acute on chronic kidney disease.  Patient reported creatinine baseline is 1.3-1.6.  On transfer from was a long was noted to creatinine been climbing from 2.2-2.7.  A plateaued at 2.7 and has declined slightly with hydration.  Treatment complicated by patient's CHF.  I discussed the case with nephrology now the family wants to pursue more aggressive care they will see in consultation today.  Hypertensive urgency.  Patient had an episode of hypertensive urgency pre-transfer.  Currently r we are using PRN antihypertensives  DVT prophylaxis: SCD/Compression stockings  Code Status:     Code Status Orders  (From admission, onward)         Start     Ordered   09/26/18 0946  Full code  Continuous     09/26/18 0947        Code Status History    Date Active Date Inactive Code Status Order ID Comments User Context   09/24/2018 1001 09/26/2018 0947 DNR [id number]  Roney Jaffe, MD ED   09/24/2018 0423 09/24/2018 1001 Full Code [id number]  Rise Patience, MD ED   10/29/2015 0046 11/05/2015 1331 Full Code [id number]  Eugenie Filler, MD Inpatient   08/08/2015 0432 08/13/2015 1739 Full Code [id number]  Edwin Dada, MD Inpatient   02/13/2015 0552 02/18/2015 1505 Full Code [id number]  Ivor Costa, MD Inpatient   10/31/2014 1332 11/03/2014 1820 Full Code [id number]  Orson Eva, MD Inpatient     Family Communication: Son and multiple family members at bedside, daughter in the [place] Disposition Plan:   Unclear at this time Consults called: Nephrology Admission status: Inpatient   Consultants:    Nephrology  Procedures:  Dg Chest 2 View  Result Date: 09/24/2018 CLINICAL DATA:  Shortness of breath. EXAM: CHEST - 2 VIEW COMPARISON:  06/22/2018 and 10/16/2017 FINDINGS: There is new pulmonary vascular congestion. There is new peripheral haziness in the right lung cyst pulmonary edema. No discrete effusions. Heart is within normal limits of size considering the AP portable technique. Aortic atherosclerosis.  No acute bone abnormalities. IMPRESSION: New pulmonary vascular congestion with slight peripheral pulmonary edema on the right. Electronically Signed   By: Lorriane Shire M.D.   On: 09/24/2018 04:12   Dg Abd 1 View  Result Date: 09/28/2018 CLINICAL DATA:  Ileus EXAM: ABDOMEN - 1 VIEW COMPARISON:  09/27/2018 FINDINGS: Nonobstructive bowel gas pattern. Mild residual contrast within ascending and transverse colon. No colonic dilatation. Degenerative changes of the lumbar spine. IMPRESSION: Unremarkable abdominal radiograph. Electronically Signed   By: Julian Hy M.D.   On: 09/28/2018 05:08   Dg Abd 1 View  Result Date: 09/27/2018 CLINICAL DATA:  Encounter for feeding tube placement. EXAM: ABDOMEN - 1 VIEW COMPARISON:  Radiograph of same day.  CT scan of June 22, 2018. FINDINGS: No small bowel dilatation is noted. Residual contrast is seen in the colon. Colonic dilatation is noted suggesting possible ileus. No abnormal calcifications are noted. IMPRESSION: Colonic dilatation is noted in left side of abdomen concerning for possible ileus. Electronically Signed   By: Marijo Conception, M.D.   On:  09/27/2018 12:21   Ct Head Wo Contrast  Result Date: 09/27/2018 CLINICAL DATA:  Neurological deficits with involuntary movements. EXAM: CT HEAD WITHOUT CONTRAST TECHNIQUE: Contiguous axial images were obtained from the base of the skull through the vertex without intravenous contrast. COMPARISON:  09/24/2018 FINDINGS: Brain: Diffuse cerebral atrophy. Ventricular dilatation consistent with central  atrophy. Low-attenuation changes in the deep white matter consistent with small vessel ischemia. Old parenchymal calcifications in the basal ganglia and left cerebellum without change. No mass effect or midline shift. No abnormal extra-axial fluid collections. Gray-white matter junctions are distinct. Basal cisterns are not effaced. No acute intracranial hemorrhage. Vascular: Prominent intracranial arterial vascular calcifications are present. Skull: Calvarium appears intact. No acute displaced fractures identified. Sinuses/Orbits: Paranasal sinuses and mastoid air cells are clear. Other: No significant change since prior study. IMPRESSION: No acute intracranial abnormalities. Chronic atrophy and small vessel ischemic changes. Electronically Signed   By: Lucienne Capers M.D.   On: 09/27/2018 03:56   Ct Head Wo Contrast  Result Date: 09/24/2018 CLINICAL DATA:  78 y/o  F; 3 days of weakness. EXAM: CT HEAD WITHOUT CONTRAST TECHNIQUE: Contiguous axial images were obtained from the base of the skull through the vertex without intravenous contrast. COMPARISON:  10/16/2017 CT head. FINDINGS: Brain: No evidence of acute infarction, hemorrhage, hydrocephalus, extra-axial collection or mass lesion/mass effect. Stable dystrophic calcifications in the basal ganglia and calcifications in the left cerebellar hemisphere. 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 Stable chronic microvascular ischemic changes and volume loss of the brain. Vascular: Calcific atherosclerosis of carotid siphons. No hyperdense vessel identified. Skull: Normal. Negative for fracture or focal lesion. Sinuses/Orbits: No acute finding. Other: Bilateral intra-ocular lens replacement. IMPRESSION: 1. No acute intracranial abnormality identified. 2. Stable chronic microvascular ischemic changes and volume loss of the brain. Stable small chronic lacunar infarct in the right caudate nucleus. Electronically Signed   By: Kristine Garbe M.D.   On: 09/24/2018 05:15   Mr Brain Wo Contrast  Result Date: 09/29/2018 CLINICAL DATA:  Acute encephalopathy. Jerking movements. Progressive weakness. EXAM: MRI HEAD WITHOUT CONTRAST TECHNIQUE: Multiplanar, multiecho pulse sequences of the brain and surrounding structures were obtained without intravenous contrast. COMPARISON:  Head CT 09/27/2018 and MRI 08/08/2015 FINDINGS: Brain: There is no evidence of acute infarct, intracranial hemorrhage, mass, midline shift, or extra-axial fluid collection. Patchy to confluent T2 hyperintensities in the cerebral white matter are slightly progressed from the prior MRI and are nonspecific but compatible with moderate chronic small vessel ischemic disease. Mild T2 hyperintensity in the basal ganglia and thalami is similar to the prior MRI. 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 There is a small remote insult in the left cerebellum with associated calcification on CT. Vascular: Major intracranial vascular flow voids are preserved. Skull and upper cervical spine: Unremarkable bone marrow signal. Sinuses/Orbits: Bilateral cataract extraction. Clear paranasal sinuses. Trace mastoid effusions. Other: None. IMPRESSION: 1. No acute intracranial abnormality. 2. Moderate chronic small vessel ischemic disease and cerebral atrophy. Electronically Signed   By: Logan Bores M.D.   On: 09/29/2018 13:22   Dg Chest Port 1 View  Result Date: 10/04/2018 CLINICAL DATA:  Shortness of Breath EXAM: PORTABLE CHEST 1 VIEW COMPARISON:  September 30, 2018 FINDINGS: There are pleural effusions bilaterally. There appears to be loculated pleural effusion on the left, a change from previous study. There is cardiomegaly with pulmonary venous hypertension. There is a degree of underlying interstitial edema. There is aortic  atherosclerosis. No bone lesions evident. IMPRESSION: Pulmonary vascular congestion with interstitial edema and  pleural effusions. There is apparent loculated effusion on the left. A degree of superimposed airspace consolidation in this area can not be excluded. The overall appearance is felt to be most consistent with congestive heart failure. There is aortic atherosclerosis. Electronically Signed   By: Lowella Grip III M.D.   On: 10/04/2018 10:29   Dg Chest Port 1 View  Result Date: 09/30/2018 CLINICAL DATA:  Fever. EXAM: PORTABLE CHEST 1 VIEW COMPARISON:  Chest x-ray 09/27/2018. FINDINGS: Cardiomegaly with bilateral interstitial prominence. Improved aeration from prior exam. Persistent right lower lobe alveolar infiltrate. This could represent asymmetric pulmonary edema and/or pneumonia. Small bilateral pleural effusions. IMPRESSION: 1. Cardiomegaly with bilateral interstitial prominence, improved aeration from prior exam. Findings suggesting improving CHF. Small bilateral pleural effusions. 2. Persistent right base infiltrate. This could represent asymmetric pulmonary edema and/or pneumonia. Similar findings on prior exam. Electronically Signed   By: Bryant   On: 09/30/2018 13:34   Dg Chest Port 1 View  Result Date: 09/27/2018 CLINICAL DATA:  Acute respiratory failure. EXAM: PORTABLE CHEST 1 VIEW COMPARISON:  Chest x-ray dated September 25, 2018. FINDINGS: The patient is rotated to the left, limiting evaluation. Stable cardiomegaly. Unchanged mild interstitial edema, small bilateral pleural effusions, and bibasilar atelectasis. Apparent increased density at the right lung base is likely due to rotation. No pneumothorax. No acute osseous abnormality. IMPRESSION: 1. Unchanged mild interstitial edema, small bilateral pleural effusions, and bibasilar atelectasis. Electronically Signed   By: Titus Dubin M.D.   On: 09/27/2018 10:33   Dg Chest Port 1 View  Result Date: 09/25/2018 CLINICAL DATA:  Follow-up swallow study. EXAM: PORTABLE CHEST 1 VIEW COMPARISON:  Radiographs of September 24, 2018. FINDINGS:  Stable cardiomegaly is noted. Atherosclerosis of thoracic aorta is noted. No pneumothorax is noted. Minimal bibasilar subsegmental atelectasis is noted with minimal pleural effusions. Bony thorax is unremarkable. Triangular area of increased density is seen in the region of the upper thoracic spine; is uncertain if this represents retained contrast from prior swallowing study. Rounded barium tablet is not clearly identified. IMPRESSION: Minimal bibasilar subsegmental atelectasis is noted with minimal pleural effusions. Triangular area of increased density is seen projected over upper thoracic spine which potentially may represent residual contrast from prior swallowing study. Aortic Atherosclerosis (ICD10-I70.0). Electronically Signed   By: Marijo Conception, M.D.   On: 09/25/2018 13:18   Dg Swallowing Func-speech Pathology  Result Date: 09/25/2018 Objective Swallowing Evaluation: Type of Study: MBS-Modified Barium Swallow Study  Patient Details Name: KASHEENA SAMBRANO MRN: [id number] Date of Birth: [birth year] Today's Date: 09/25/2018 Time: SLP Start Time (ACUTE ONLY): 0750 -SLP Stop Time (ACUTE ONLY): 0804 SLP Time Calculation (min) (ACUTE ONLY): 14 min Past Medical History: Past Medical History: Diagnosis Date . Adrenal insufficiency ([place])  . Adrenal insufficiency ([place])   Dx'd [facility] 05/2015 . Asthma  . CHF (congestive heart failure) ([place])  . GERD (gastroesophageal reflux disease)  . Glaucoma  . Headache  . HTN (hypertension)  . Hypertension  . Hypothyroidism  . Normocytic anemia 09/07/2018 . Shortness of breath dyspnea  . Thyroid disease   not know in detail Past Surgical History: Past Surgical History: Procedure Laterality Date . APPENDECTOMY   . BACK SURGERY   . CAPSULOTOMY  08/23/2011  Procedure: MINOR CAPSULOTOMY;  Surgeon: Myrtha Mantis.;  Location: [place];  Service: Ophthalmology;  Laterality: Left;  YAG Capsulotomy Left Eye . ESOPHAGOGASTRODUODENOSCOPY N/A 03/17/2013  Procedure:  ESOPHAGOGASTRODUODENOSCOPY (EGD);  Surgeon: Irene Shipper, MD;  Location: [facility];  Service: Endoscopy;  Laterality: N/A; . LEFT CATARACT EXTRACTION    2007 . RIGHT CATARACT EXTRACTION    2006 HPI: pt is a 78 yo female adm to [facility] with AMS x3 days, CT head + basal ganglia and left cerebellar calcification, 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  Pt with h/o CHF, renal dysfunction. Swallow evauluation ordered as pt had difficulty swallowing a pill this am.  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   Subjective: pt awake in chair Assessment / Plan / Recommendation CHL IP CLINICAL IMPRESSIONS 09/25/2018 Clinical Impression Pt's swallow function was inconsistent throughout MBS - neck extension posture significantly worsened pharyngeal contraction and thus resulted in gross residuals of puree/thin. Oral deficits significant mostly with liquids resulting in liquids spilling into pharynx not controlled and resulting in aspiration before the swallow with nectar.  Aspiration after the swallow from pyriform sinus spillage did elicit a delayed cough but not adequately strong to clear pharynx.  Chin tuck posture improved airway protection significantly and prevented aspiration as well as decreased pharyngeal residuals.  Cued dry swallows helpful to clear oral residuals.  Of note, pt appeared with significant stasis in proximal esophagus without sensation *just below UES without awareness.  Pudding consistency with tablet did not clear this area despite follow up liquid swallows.  Dependent on family's goal for pt, consideration for GI referral recommended given son's report of chronicity of dysphagia for "a long time".  Recommend to start full liquid diet *nectar* and allow thin water between meals. Use straw to faciliate chin down posture for pt.  Son present and wishes to interpret *not ipad interpreter* therefore allowed in xray.  SLP will follow up for pt tolerance,  family education.   SLP Visit Diagnosis Dysphagia, oropharyngeal phase (R13.12);Dysphagia, pharyngoesophageal phase (R13.14) Attention and concentration deficit following -- Frontal lobe and executive function deficit following -- Impact on safety and function Moderate aspiration risk;Risk for inadequate nutrition/hydration   CHL IP TREATMENT RECOMMENDATION 09/25/2018 Treatment Recommendations Therapy as outlined in treatment plan below   Prognosis 09/25/2018 Prognosis for Safe Diet Advancement Fair Barriers to Reach Goals Time post onset Barriers/Prognosis Comment -- CHL IP DIET RECOMMENDATION 09/25/2018 SLP Diet Recommendations Nectar thick liquid Liquid Administration via -- Medication Administration Crushed with puree Compensations Slow rate;Small sips/bites Postural Changes --   CHL IP OTHER RECOMMENDATIONS 09/25/2018 Recommended Consults Consider GI evaluation Oral Care Recommendations Oral care before and after PO Other Recommendations Order thickener from pharmacy;Have oral suction available   CHL IP FOLLOW UP RECOMMENDATIONS 09/25/2018 Follow up Recommendations (No Data)   CHL IP FREQUENCY AND DURATION 09/25/2018 Speech Therapy Frequency (ACUTE ONLY) min 2x/week Treatment Duration 1 week      CHL IP ORAL PHASE 09/25/2018 Oral Phase Impaired Oral - Pudding Teaspoon -- Oral - Pudding Cup -- Oral - Honey Teaspoon -- Oral - Honey Cup -- Oral - Nectar Teaspoon Weak lingual manipulation;Decreased bolus cohesion;Reduced posterior propulsion;Premature spillage Oral - Nectar Cup -- Oral - Nectar Straw Weak lingual manipulation;Decreased bolus cohesion;Reduced posterior propulsion;Premature spillage;Delayed oral transit Oral - Thin Teaspoon Decreased bolus cohesion;Weak lingual manipulation;Reduced posterior propulsion;Premature spillage;Lingual/palatal residue;Delayed oral transit Oral - Thin Cup -- Oral - Thin Straw Reduced posterior propulsion;Weak lingual manipulation;Decreased bolus cohesion;Premature  spillage;Lingual/palatal residue;Delayed oral transit Oral - Puree Decreased bolus cohesion;Weak lingual manipulation;Lingual pumping;Premature spillage;Lingual/palatal residue;Delayed oral transit Oral - Mech Soft Decreased bolus cohesion;Premature spillage;Delayed oral  transit Oral - Regular -- Oral - Multi-Consistency -- Oral - Pill Decreased bolus cohesion;Premature spillage;Weak lingual manipulation;Lingual pumping;Delayed oral transit Oral Phase - Comment --  CHL IP PHARYNGEAL PHASE 09/25/2018 Pharyngeal Phase Impaired Pharyngeal- Pudding Teaspoon -- Pharyngeal -- Pharyngeal- Pudding Cup -- Pharyngeal -- Pharyngeal- Honey Teaspoon -- Pharyngeal -- Pharyngeal- Honey Cup -- Pharyngeal -- Pharyngeal- Nectar Teaspoon -- Pharyngeal -- Pharyngeal- Nectar Cup -- Pharyngeal -- Pharyngeal- Nectar Straw WFL Pharyngeal -- Pharyngeal- Thin Teaspoon Penetration/Aspiration before swallow Pharyngeal Material enters airway, passes BELOW cords without attempt by patient to eject out (silent aspiration) Pharyngeal- Thin Cup -- Pharyngeal -- Pharyngeal- Thin Straw Penetration/Apiration after swallow;Pharyngeal residue - valleculae;Pharyngeal residue - pyriform Pharyngeal Material enters airway, passes BELOW cords without attempt by patient to eject out (silent aspiration);Material enters airway, passes BELOW cords and not ejected out despite cough attempt by patient Pharyngeal- Puree Reduced tongue base retraction;Reduced airway/laryngeal closure;Reduced laryngeal elevation;Pharyngeal residue - valleculae;Pharyngeal residue - pyriform;Reduced pharyngeal peristalsis Pharyngeal -- Pharyngeal- Mechanical Soft Reduced tongue base retraction;Pharyngeal residue - valleculae Pharyngeal -- Pharyngeal- Regular -- Pharyngeal -- Pharyngeal- Multi-consistency -- Pharyngeal -- Pharyngeal- Pill Pharyngeal residue - valleculae Pharyngeal -- Pharyngeal Comment pt with gross residuals in pharynx intermittently - most notably with head extended  upward with swallow, dry swallow attempts are essentially not initiated as very minimal movement observed, aspiration of thin occured after the swallow as pyriform sinus residual spill into open airway - posterior trach,   CHL IP CERVICAL ESOPHAGEAL PHASE 09/25/2018 Cervical Esophageal Phase Impaired Pudding Teaspoon -- Pudding Cup -- Honey Teaspoon -- Honey Cup -- Nectar Teaspoon -- Nectar Cup -- Nectar Straw -- Thin Teaspoon -- Thin Cup -- Thin Straw -- Puree -- Mechanical Soft -- Regular -- Multi-consistency -- Pill -- Cervical Esophageal Comment pt appeared with significant residuals below UES WITHOUT awareness, much more prominent with puree than liquids Macario Golds 09/25/2018, 10:45 AM Luanna Salk, MS [facility] SLP Acute Rehab Services Pager [phone] Office [phone]              Vas [place] Lower Extremity Venous (dvt)  Result Date: 09/24/2018  Lower Venous Study Indications: Edema.  Performing Technologist: Oliver Hum RVT  Examination Guidelines: A complete evaluation includes B-mode imaging, spectral Doppler, color Doppler, and power Doppler as needed of all accessible portions of each vessel. Bilateral testing is considered an integral part of a complete examination. Limited examinations for reoccurring indications may be performed as noted.  Right Venous Findings: +---------+---------------+---------+-----------+----------+-------+          CompressibilityPhasicitySpontaneityPropertiesSummary +---------+---------------+---------+-----------+----------+-------+ CFV      Full           Yes      Yes                          +---------+---------------+---------+-----------+----------+-------+ SFJ      Full                                                 +---------+---------------+---------+-----------+----------+-------+ FV Prox  Full                                                 +---------+---------------+---------+-----------+----------+-------+ FV Mid   Full                                                  +---------+---------------+---------+-----------+----------+-------+  FV DistalFull                                                 +---------+---------------+---------+-----------+----------+-------+ PFV      Full                                                 +---------+---------------+---------+-----------+----------+-------+ POP      Full           Yes      Yes                          +---------+---------------+---------+-----------+----------+-------+ PTV      Full                                                 +---------+---------------+---------+-----------+----------+-------+ PERO     Full                                                 +---------+---------------+---------+-----------+----------+-------+  Left Venous Findings: +---------+---------------+---------+-----------+----------+-------+          CompressibilityPhasicitySpontaneityPropertiesSummary +---------+---------------+---------+-----------+----------+-------+ CFV      Full           Yes      Yes                          +---------+---------------+---------+-----------+----------+-------+ SFJ      Full                                                 +---------+---------------+---------+-----------+----------+-------+ FV Prox  Full                                                 +---------+---------------+---------+-----------+----------+-------+ FV Mid   Full                                                 +---------+---------------+---------+-----------+----------+-------+ FV DistalFull                                                 +---------+---------------+---------+-----------+----------+-------+ PFV      Full                                                 +---------+---------------+---------+-----------+----------+-------+  POP      Full           Yes      Yes                           +---------+---------------+---------+-----------+----------+-------+ PTV      Full                                                 +---------+---------------+---------+-----------+----------+-------+ PERO     Full                                                 +---------+---------------+---------+-----------+----------+-------+    Summary: Right: There is no evidence of deep vein thrombosis in the lower extremity. No cystic structure found in the popliteal fossa. Left: There is no evidence of deep vein thrombosis in the lower extremity. No cystic structure found in the popliteal fossa.  *See table(s) above for measurements and observations. Electronically signed by Monica Martinez MD on 09/24/2018 at 3:52:06 PM.    Final      Antimicrobials:   Discontinued given negative work-up   Subjective: Patient still remains obtunded with minimal interaction.  Objective: Vitals:   10/04/18 0400 10/04/18 0500 10/04/18 0707 10/04/18 1159  BP: (!) 189/67 (!) 179/76 (!) 187/71 (!) 181/78  Pulse:   60 70  Resp: 16  19 15   Temp: 98.2 F (36.8 C)  (!) 97.4 F (36.3 C) 97.7 F (36.5 C)  TempSrc: Oral  Axillary Axillary  SpO2: 100%  99% 98%  Weight:      Height:        Intake/Output Summary (Last 24 hours) at 10/04/2018 1252 Last data filed at 10/04/2018 0600 Gross per 24 hour  Intake 1000 ml  Output 600 ml  Net 400 ml   Filed Weights   09/27/18 0500 09/28/18 0500 10/01/18 0500  Weight: 52.4 kg 52 kg 52.9 kg    Examination:  General exam: Not interacting will come Respiratory system: Mild rhonchi bilaterally, normal respiratory effort no wheezing Cardiovascular system: S1 & S2 heard, RRR. No JVD, murmurs, rubs, gallops or clicks. No pedal edema. Gastrointestinal system: Abdomen is nondistended, soft and nontender. No organomegaly or masses felt. Normal bowel sounds heard. Central nervous system: No blink reflex, not interactive, Extremities: Symmetric 5 x 5 power.  No  contractures Skin: No rashes, lesions or ulcers Psychiatry: Not interactive unable to assess, no obvious decompensation.     Data Reviewed: I have personally reviewed following labs and imaging studies  CBC: Recent Labs  Lab 09/28/18 0342 09/29/18 0354 10/02/18 0815 10/04/18 1015  WBC 7.1 4.8 4.2 4.3  NEUTROABS 5.5 3.5 2.8 2.8  HGB 8.2* 8.2* 8.4* 8.9*  HCT 25.1* 25.5* 26.6* 28.5*  MCV 95.8 97.3 95.3 96.9  PLT 142* 167 205 761   Basic Metabolic Panel: Recent Labs  Lab 09/28/18 0342 09/29/18 0354  10/02/18 0815 10/02/18 1714 10/03/18 0804 10/03/18 1619 10/04/18 1015  NA 140  140 145   < > 156* 156* 154* 154* 150*  K 4.0  4.1 3.8   < > 3.7 3.5 3.3* 3.4* 3.8  CL 113*  113* 117*   < > 122* 123*  126* 126* 123*  CO2 19*  19* 17*   < > 18* 18* 19* 20* 17*  GLUCOSE 100*  102* 71   < > 112* 145* 184* 155* 156*  BUN 45*  42* 48*   < > 71* 76* 75* 74* 69*  CREATININE 1.98*  1.92* 2.21*   < > 2.63* 2.71* 2.62* 2.55* 2.38*  CALCIUM 8.0*  8.0* 8.4*   < > 8.3* 7.9* 8.0* 8.0* 8.2*  MG 2.4 2.3  --   --   --   --   --   --   PHOS 5.0* 4.6  --   --   --   --   --   --    < > = values in this interval not displayed.   GFR: Estimated Creatinine Clearance: 14.5 mL/min (A) (by C-G formula based on SCr of 2.38 mg/dL (H)). Liver Function Tests: Recent Labs  Lab 09/28/18 0342 09/29/18 0354 09/30/18 0258 10/01/18 0948 10/04/18 1015  AST  --   --  45* 43* 31  ALT  --   --  25 24 17   ALKPHOS  --   --  59 57 53  BILITOT  --   --  1.4* 0.9 0.9  PROT  --   --  6.1* 5.9* 5.6*  ALBUMIN 2.5* 2.3* 2.4* 2.1* 1.9*   No results for input(s): LIPASE, AMYLASE in the last 168 hours. No results for input(s): AMMONIA in the last 168 hours. Coagulation Profile: Recent Labs  Lab 10/03/18 1215  INR 1.13   Cardiac Enzymes: No results for input(s): CKTOTAL, CKMB, CKMBINDEX, TROPONINI in the last 168 hours. BNP (last 3 results) No results for input(s): PROBNP in the last 8760  hours. HbA1C: No results for input(s): HGBA1C in the last 72 hours. CBG: No results for input(s): GLUCAP in the last 168 hours. Lipid Profile: No results for input(s): CHOL, HDL, LDLCALC, TRIG, CHOLHDL, LDLDIRECT in the last 72 hours. Thyroid Function Tests: No results for input(s): TSH, T4TOTAL, FREET4, T3FREE, THYROIDAB in the last 72 hours. Anemia Panel: No results for input(s): VITAMINB12, FOLATE, FERRITIN, TIBC, IRON, RETICCTPCT in the last 72 hours. Sepsis Labs: Recent Labs  Lab 09/29/18 0354  PROCALCITON 0.36    Recent Results (from the past 240 hour(s))  Culture, blood (Routine X 2) w Reflex to ID Panel     Status: None   Collection Time: 09/24/18  3:18 PM  Result Value Ref Range Status   Specimen Description   Final    BLOOD RIGHT ARM Performed at [facility] [address], [place], [place] [address]    Special Requests   Final    BOTTLES DRAWN AEROBIC AND ANAEROBIC Blood Culture adequate volume Performed at [facility] [address] [place], [place] [address]    Culture   Final    NO GROWTH 5 DAYS Performed at [facility], [place] [address], [place], [place] [address]    Report Status 09/30/2018 FINAL  Final  Culture, blood (Routine X 2) w Reflex to ID Panel     Status: None   Collection Time: 09/24/18 11:03 PM  Result Value Ref Range Status   Specimen Description BLOOD RIGHT HAND  Final   Special Requests   Final    BOTTLES DRAWN AEROBIC AND ANAEROBIC Blood Culture adequate volume   Culture   Final    NO GROWTH 5 DAYS Performed at [facility], [place] [address], [place], [place]  [address]    Report Status 09/30/2018 FINAL  Final  MRSA PCR Screening     Status: None   Collection Time: 09/27/18  9:44 AM  Result Value Ref Range Status   MRSA by PCR NEGATIVE NEGATIVE Final    Comment:        The GeneXpert MRSA Assay (FDA approved for NASAL specimens only), is one component of a comprehensive MRSA  colonization surveillance program. It is not intended to diagnose MRSA infection nor to guide or monitor treatment for MRSA infections. Performed at [facility], [place] [address], [place], [place] [address]   Respiratory Panel by PCR     Status: None   Collection Time: 09/29/18  2:22 PM  Result Value Ref Range Status   Adenovirus NOT DETECTED NOT DETECTED Final   Coronavirus 229E NOT DETECTED NOT DETECTED Final    Comment: (NOTE) The Coronavirus on the Respiratory Panel, DOES NOT test for the novel  Coronavirus (2019 nCoV)    Coronavirus HKU1 NOT DETECTED NOT DETECTED Final   Coronavirus NL63 NOT DETECTED NOT DETECTED Final   Coronavirus OC43 NOT DETECTED NOT DETECTED Final   Metapneumovirus NOT DETECTED NOT DETECTED Final   Rhinovirus / Enterovirus NOT DETECTED NOT DETECTED Final   Influenza A NOT DETECTED NOT DETECTED Final   Influenza B NOT DETECTED NOT DETECTED Final   Parainfluenza Virus 1 NOT DETECTED NOT DETECTED Final   Parainfluenza Virus 2 NOT DETECTED NOT DETECTED Final   Parainfluenza Virus 3 NOT DETECTED NOT DETECTED Final   Parainfluenza Virus 4 NOT DETECTED NOT DETECTED Final   Respiratory Syncytial Virus NOT DETECTED NOT DETECTED Final   Bordetella pertussis NOT DETECTED NOT DETECTED Final   Chlamydophila pneumoniae NOT DETECTED NOT DETECTED Final   Mycoplasma pneumoniae NOT DETECTED NOT DETECTED Final    Comment: Performed at [facility], [place]. [address] [place], [place] [address]  Culture, blood (Routine X 2) w Reflex to ID Panel     Status: None (Preliminary result)   Collection Time: 09/30/18 11:36 AM  Result Value Ref Range Status   Specimen Description   Final    BLOOD LEFT HAND Performed at [facility] [address] [place], [place] [address]    Special Requests   Final    BOTTLES DRAWN AEROBIC AND ANAEROBIC Blood Culture adequate volume Performed at [facility] [address],  [place], [place] [address]    Culture   Final    NO GROWTH 3 DAYS Performed at [facility], [place] [address] [place], [place] [address]    Report Status PENDING  Incomplete  Culture, blood (Routine X 2) w Reflex to ID Panel     Status: None (Preliminary result)   Collection Time: 09/30/18 11:37 AM  Result Value Ref Range Status   Specimen Description   Final    BLOOD RIGHT HAND Performed at [facility] [address], [place], [place] [address]    Special Requests   Final    BOTTLES DRAWN AEROBIC AND ANAEROBIC Blood Culture adequate volume Performed at [facility] [address], [place], [place] [address]    Culture   Final    NO GROWTH 3 DAYS Performed at [facility], [place] [address] [place],  [address]    Report Status PENDING  Incomplete  Urine Culture     Status: None   Collection Time: 09/30/18 12:38 PM  Result Value Ref Range  Status   Specimen Description   Final    URINE, RANDOM Performed at [facility] [address] [place], [place] [address]    Special Requests   Final    NONE Performed at [facility], [place] [address], [place], [place] [address]    Culture   Final    NO GROWTH Performed at [facility], [place] [address], [place], [place] [address]    Report Status 10/01/2018 FINAL  Final  CSF culture with Stat gram stain     Status: None   Collection Time: 10/01/18  9:55 AM  Result Value Ref Range Status   Specimen Description CSF  Final   Special Requests Normal  Final   Gram Stain   Final    CYTOSPIN SMEAR WBC PRESENT, PREDOMINANTLY MONONUCLEAR NO ORGANISMS SEEN    Culture   Final    NO GROWTH 3 DAYS Performed at [facility], [place] [address], [place], [place] [address]    Report Status 10/04/2018 FINAL  Final  Fungus Culture With Stain     Status: None (Preliminary result)   Collection Time: 10/01/18  9:55 AM  Result Value Ref Range Status    Fungus Stain Final report  Final    Comment: (NOTE) Performed At: [facility] [place], [place] [id number] Rush Farmer MD [id number]    Fungus (Mycology) Culture PENDING  Incomplete   Fungal Source CSF  Final    Comment: Performed at [facility], [place] [address], [place], [place] [address]  Fungus Culture Result     Status: None   Collection Time: 10/01/18  9:55 AM  Result Value Ref Range Status   Result 1 Comment  Final    Comment: (NOTE) KOH/Calcofluor preparation:  no fungus observed. Performed At: [facility] [place], [place] [id number] Rush Farmer MD [id number]          Radiology Studies: Dg Chest Port 1 View  Result Date: 10/04/2018 CLINICAL DATA:  Shortness of Breath EXAM: PORTABLE CHEST 1 VIEW COMPARISON:  September 30, 2018 FINDINGS: There are pleural effusions bilaterally. There appears to be loculated pleural effusion on the left, a change from previous study. There is cardiomegaly with pulmonary venous hypertension. There is a degree of underlying interstitial edema. There is aortic atherosclerosis. No bone lesions evident. IMPRESSION: Pulmonary vascular congestion with interstitial edema and pleural effusions. There is apparent loculated effusion on the left. A degree of superimposed airspace consolidation in this area can not be excluded. The overall appearance is felt to be most consistent with congestive heart failure. There is aortic atherosclerosis. Electronically Signed   By: Lowella Grip III M.D.   On: 10/04/2018 10:29        Scheduled Meds: . budesonide (PULMICORT) nebulizer solution  0.25 mg Nebulization BID  . chlorhexidine  15 mL Mouth Rinse BID  . cloNIDine  0.3 mg Transdermal Weekly  . ipratropium-albuterol  3 mL Nebulization BID  . levothyroxine  37.5 mcg Intravenous Daily  . LORazepam  2 mg Intravenous Once  . mouth rinse  15 mL Mouth Rinse q12n4p  . metoprolol tartrate  10 mg  Intravenous Q6H   Continuous Infusions:   LOS: 10 days    Time spent: [place], MD [facility]  If 7PM-7AM, please contact night-coverage  10/04/2018, 12:52 PM

## 2018-10-04 NOTE — Consult Note (Signed)
[facility]    NEPHROLOGY CONSULTATION NOTE  PATIENT ID:  Katie Mosley, DOB:  24-Oct-1940  HPI: The patient is a 78 y.o. year old female with a past medical history significant for heart failure with preserved ejection fraction with the most recent echocardiogram in March 2017 with an ejection fraction of 60 to 65%, chronic hyponatremia, chronic kidney disease stage III, chronic anemia, hypothyroidism, and hypertension who presented to the emergency department with complaints of fatigue and shortness of breath for the past 1 week.  She was also noted to have increased swelling in the lower extremities with associated productive cough.  Her sodium was 123 on admission.  She was thought to have acute congestive heart failure due to her fluid overloaded examination.  Throughout her hospitalization, her mental status has waxed and waned.  Her intake has been poor.  She has been maintained on IV fluids for intake due to hypernatremia, but then she has become volume overloaded.  She completed a 5-day course of meropenem then cefepime for gram-negative pneumonia.  Renal consultation has been called for management of hypernatremia and acute kidney injury.   Past Medical History:  Diagnosis Date  . Adrenal insufficiency ([place])   . Adrenal insufficiency ([place])    Dx'd [facility] 05/2015  . Asthma   . CHF (congestive heart failure) ([place])   . GERD (gastroesophageal reflux disease)   . Glaucoma   . Headache   . HTN (hypertension)   . Hypertension   . Hypothyroidism   . Normocytic anemia 09/07/2018  . Shortness of breath dyspnea   . Thyroid disease    not know in detail    Past Surgical History:  Procedure Laterality Date  . APPENDECTOMY    . BACK SURGERY    . CAPSULOTOMY  08/23/2011   Procedure: MINOR CAPSULOTOMY;  Surgeon: Myrtha Mantis.;  Location: [facility];  Service: Ophthalmology;  Laterality: Left;  YAG Capsulotomy Left Eye  . ESOPHAGOGASTRODUODENOSCOPY N/A 03/17/2013   Procedure: ESOPHAGOGASTRODUODENOSCOPY (EGD);  Surgeon: Irene Shipper, MD;  Location[facility] ENDOSCOPY;  Service: Endoscopy;  Laterality: N/A;  . LEFT CATARACT EXTRACTION     2007  . RIGHT CATARACT EXTRACTION     2006    History reviewed. No pertinent family history.  Social History   Tobacco Use  . Smoking status: Never Smoker  . Smokeless tobacco: Never Used  Substance Use Topics  . Alcohol use: No  . Drug use: No    REVIEW OF SYSTEMS: Unable to obtain review of systems secondary to mental status.   PHYSICAL EXAM:  Vitals:   10/04/18 1159 10/04/18 1602  BP: (!) 181/78 (!) 141/43  Pulse: 70 66  Resp: 15 14  Temp: 97.7 F (36.5 C)   SpO2: 98% 96%   I/O last 3 completed shifts: In: 2006.4 [I.V.:2006.4] Out: 1400 [Urine:1400]   General: Lethargic, NAD HEENT: MMM [place] AT anicteric sclera Neck:  No JVD, no adenopathy CV:  Heart RRR  Lungs:  L/S CTA bilaterally Abd:  abd SNT/ND with normal BS GU:  Bladder non-palpable Extremities: +1 LE edema. Skin:  No skin rash Psych:  normal mood and affect Neuro:  no focal deficits  MEDICATIONS:  Current Meds  Medication Sig  . acetaminophen (TYLENOL) 325 MG tablet Take 325 mg by mouth every 6 (six) hours as needed for moderate pain.   Marland Kitchen albuterol (PROVENTIL HFA;VENTOLIN HFA) 108 (90 Base) MCG/ACT inhaler Inhale 2 puffs into the lungs. Every 4 to 6 hours as needed  for shortness of breath and wheezing  . aspirin EC 81 MG tablet Take 81 mg by mouth daily.  Marland Kitchen docusate sodium (COLACE) 100 MG capsule Take 1 capsule (100 mg total) by mouth every 12 (twelve) hours.  . furosemide (LASIX) 40 MG tablet Take 40 mg by mouth daily.  Marland Kitchen HYDROcodone-acetaminophen (NORCO/VICODIN) 5-325 MG tablet Take 1 tablet by mouth every 4 (four) hours as needed. (Patient taking differently: Take 1 tablet by mouth every 4 (four) hours as needed for moderate pain. )  . Iron-FA-B Cmp-C-Biot-Probiotic (FUSION PLUS) CAPS Take 1 tablet by mouth daily.  Marland Kitchen levothyroxine  (SYNTHROID, LEVOTHROID) 50 MCG tablet Take 1 tablet (50 mcg total) by mouth daily before breakfast.  . metoprolol succinate (TOPROL-XL) 50 MG 24 hr tablet Take 50 mg by mouth daily.  . pantoprazole (PROTONIX) 40 MG tablet Take 1 tablet (40 mg total) by mouth daily.     . budesonide (PULMICORT) nebulizer solution  0.25 mg Nebulization BID  . chlorhexidine  15 mL Mouth Rinse BID  . cloNIDine  0.3 mg Transdermal Weekly  . ipratropium-albuterol  3 mL Nebulization BID  . levothyroxine  37.5 mcg Intravenous Daily  . LORazepam  2 mg Intravenous Once  . mouth rinse  15 mL Mouth Rinse q12n4p  . metoprolol tartrate  10 mg Intravenous Q6H       LABS:  CBC Latest Ref Rng & Units 10/04/2018 10/02/2018 09/29/2018  WBC 4.0 - 10.5 K/uL 4.3 4.2 4.8  Hemoglobin 12.0 - 15.0 g/dL 8.9(L) 8.4(L) 8.2(L)  Hematocrit 36.0 - 46.0 % 28.5(L) 26.6(L) 25.5(L)  Platelets 150 - 400 K/uL 184 205 167    CMP Latest Ref Rng & Units 10/04/2018 10/03/2018 10/03/2018  Glucose 70 - 99 mg/dL 156(H) 155(H) 184(H)  BUN 8 - 23 mg/dL 69(H) 74(H) 75(H)  Creatinine 0.44 - 1.00 mg/dL 2.38(H) 2.55(H) 2.62(H)  Sodium 135 - 145 mmol/L 150(H) 154(H) 154(H)  Potassium 3.5 - 5.1 mmol/L 3.8 3.4(L) 3.3(L)  Chloride 98 - 111 mmol/L 123(H) 126(H) 126(H)  CO2 22 - 32 mmol/L 17(L) 20(L) 19(L)  Calcium 8.9 - 10.3 mg/dL 8.2(L) 8.0(L) 8.0(L)  Total Protein 6.5 - 8.1 g/dL 5.6(L) - -  Total Bilirubin 0.3 - 1.2 mg/dL 0.9 - -  Alkaline Phos 38 - 126 U/L 53 - -  AST 15 - 41 U/L 31 - -  ALT 0 - 44 U/L 17 - -    Lab Results  Component Value Date   CALCIUM 8.2 (L) 10/04/2018   CAION 1.19 08/07/2015   PHOS 4.6 09/29/2018       Component Value Date/Time   COLORURINE AMBER (A) 09/28/2018 0759   APPEARANCEUR CLOUDY (A) 09/28/2018 0759   LABSPEC 1.015 09/28/2018 0759   PHURINE 6.0 09/28/2018 0759   GLUCOSEU NEGATIVE 09/28/2018 0759   HGBUR LARGE (A) 09/28/2018 0759   BILIRUBINUR NEGATIVE 09/28/2018 0759   KETONESUR NEGATIVE 09/28/2018 0759    PROTEINUR 100 (A) 09/28/2018 0759   UROBILINOGEN 1.0 06/08/2015 1724   NITRITE NEGATIVE 09/28/2018 0759   LEUKOCYTESUR MODERATE (A) 09/28/2018 0759      Component Value Date/Time   PHART 7.371 09/27/2018 0902   PCO2ART 32.3 09/27/2018 0902   PO2ART 104 09/27/2018 0902   HCO3 18.2 (L) 09/27/2018 0902   TCO2 22 08/07/2015 2329   ACIDBASEDEF 5.8 (H) 09/27/2018 0902   O2SAT 97.8 09/27/2018 0902       Component Value Date/Time   IRON 144 09/24/2018 0605   TIBC 358 09/24/2018 0605   FERRITIN  139 09/24/2018 0605   IRONPCTSAT 40 (H) 09/24/2018 0605       ASSESSMENT/PLAN:     Problem List Items Addressed This Visit      Respiratory   Acute respiratory failure (HCC)   Relevant Orders   DG CHEST PORT 1 VIEW (Completed)     Other   Hyponatremia - Primary   RESOLVED: SOB (shortness of breath)   Relevant Orders   DG Chest 2 View (Completed)    Other Visit Diagnoses    Follow up       Relevant Orders   DG CHEST PORT 1 VIEW (Completed)   Encounter for feeding tube placement       Relevant Orders   DG Abd 1 View (Completed)   Ileus ([place])       Relevant Orders   DG Abd 1 View (Completed)   Fever       Relevant Orders   DG CHEST PORT 1 VIEW (Completed)   AMS (altered mental status)       Difficult intravenous access       Relevant Orders   IRPICC PLACEMENT LEFT >5 YRS INC IMG GUIDE   CHF (congestive heart failure) (HCC)       Relevant Medications   furosemide (LASIX) 40 MG tablet   furosemide (LASIX) injection 20 mg (Completed)   furosemide (LASIX) injection 80 mg (Completed)   hydrALAZINE (APRESOLINE) injection 5-10 mg   cloNIDine (CATAPRES - Dosed in mg/24 hr) patch 0.3 mg   metoprolol tartrate (LOPRESSOR) injection 10 mg   furosemide (LASIX) injection 20 mg (Completed)   Other Relevant Orders   DG CHEST PORT 1 VIEW (Completed)      1.  Chronic kidney disease stage III with a baseline serum creatinine around 1.3-1.6.  This is likely on the basis of longstanding  hypertension and atherosclerotic cardiovascular disease.  2.  Hypernatremia.  Likely secondary to poor p.o. intake.  Challenging situation as she is not tolerating IV fluids well due to underlying congestive heart failure.  Ultimately, she needs to be hydrated orally.  If the family desires would place a PEG tube.  3.  Hypertension.  She originally presented with hypertensive urgency, which has somewhat improved.  Would plan regular IV hypertensives until the PEG tube is placed.  4.  Acute on chronic diastolic congestive heart failure.  Recommend PEG tube placement as continuing IV fluids well be challenging.  Avoid sodium and IV fluids.    [place], DO, [place]

## 2018-10-04 NOTE — Progress Notes (Signed)
Urine in drainage bag is amber color with what appear to be dark red bloody sediment.

## 2018-10-05 LAB — CBC WITH DIFFERENTIAL/PLATELET
Abs Immature Granulocytes: 0.03 10*3/uL (ref 0.00–0.07)
BASOS PCT: 0 %
Basophils Absolute: 0 10*3/uL (ref 0.0–0.1)
EOS PCT: 5 %
Eosinophils Absolute: 0.2 10*3/uL (ref 0.0–0.5)
HCT: 29 % — ABNORMAL LOW (ref 36.0–46.0)
Hemoglobin: 9.1 g/dL — ABNORMAL LOW (ref 12.0–15.0)
Immature Granulocytes: 1 %
Lymphocytes Relative: 19 %
Lymphs Abs: 0.8 10*3/uL (ref 0.7–4.0)
MCH: 29.8 pg (ref 26.0–34.0)
MCHC: 31.4 g/dL (ref 30.0–36.0)
MCV: 95.1 fL (ref 80.0–100.0)
Monocytes Absolute: 0.3 10*3/uL (ref 0.1–1.0)
Monocytes Relative: 7 %
Neutro Abs: 3 10*3/uL (ref 1.7–7.7)
Neutrophils Relative %: 68 %
Platelets: 190 10*3/uL (ref 150–400)
RBC: 3.05 MIL/uL — ABNORMAL LOW (ref 3.87–5.11)
RDW: 16.8 % — ABNORMAL HIGH (ref 11.5–15.5)
WBC: 4.4 10*3/uL (ref 4.0–10.5)
nRBC: 0 % (ref 0.0–0.2)

## 2018-10-05 LAB — CULTURE, BLOOD (ROUTINE X 2)
Culture: NO GROWTH
Culture: NO GROWTH
SPECIAL REQUESTS: ADEQUATE
Special Requests: ADEQUATE

## 2018-10-05 LAB — BASIC METABOLIC PANEL
Anion gap: 8 (ref 5–15)
BUN: 66 mg/dL — AB (ref 8–23)
CO2: 18 mmol/L — ABNORMAL LOW (ref 22–32)
CREATININE: 2.34 mg/dL — AB (ref 0.44–1.00)
Calcium: 8.2 mg/dL — ABNORMAL LOW (ref 8.9–10.3)
Chloride: 123 mmol/L — ABNORMAL HIGH (ref 98–111)
GFR calc Af Amer: 22 mL/min — ABNORMAL LOW (ref >60)
GFR calc non Af Amer: 19 mL/min — ABNORMAL LOW (ref >60)
Glucose, Bld: 111 mg/dL — ABNORMAL HIGH (ref 70–99)
Potassium: 4.1 mmol/L (ref 3.5–5.1)
Sodium: 149 mmol/L — ABNORMAL HIGH (ref 135–145)

## 2018-10-05 LAB — MAGNESIUM: Magnesium: 2 mg/dL (ref 1.7–2.4)

## 2018-10-05 NOTE — Plan of Care (Signed)
  Problem: Education: Goal: Knowledge of General Education information will improve Description Including pain rating scale, medication(s)/side effects and non-pharmacologic comfort measures Outcome: Progressing Note:  POC reviewed with family and with pt.- unsure how much pt. understands.

## 2018-10-05 NOTE — Consult Note (Signed)
[facility] Nurse wound consult note Reason for Consult: left posterior heel pressure injury (POA).  Ruptured blister, partial thickness Wound type:Pressure Pressure Injury POA: Yes Measurement: 4cm x 3cm with 0.1cm depth Wound bed:dry epidermis covers injury. Drainage (amount, consistency, odor) none Periwound:intact Dressing procedure/placement/frequency: I will provide Nursing with guidance for the care of this wound, specifically to pain it daily with a betadine swabstick to dry and provide antimicrobial environment. After air-drying, foot/feet are to be placed in bilateral pressure redistribution heel boots. Side to side positioning is in place, bed is at or below a 30-degree angle. I have requested sacral prophylactic dressings.  [facility] nursing team will not follow, but will remain available to this patient, the nursing and medical teams.  Please re-consult if needed. Thanks, Maudie Flakes, MSN, RN, [place], Arther Abbott  Pager# [phone]

## 2018-10-05 NOTE — Progress Notes (Signed)
PROGRESS NOTE    Katie Mosley  KYH:[id number] DOB: 05-19-1941 DOA: 09/23/2018 PCP: Nolene Ebbs, MD   Brief Narrative:  As previously noted:Katie B Sheikhunais a 78 y.o.femalewithhistory of diastolic CHF last EF measured in March 2017 was 60 to 65% with grade 1 diastolic dysfunction, chronic hyponatremia, chronic kidney disease stage III, chronic anemia, hypothyroidism hypertension presents to the ER with complaints of having fatigue and shortness of breath for the last 1 week. Patient also noticed increasing swelling in the lower extremities. Has had some productive cough. Patient also has been feeling weak and fatigued easily. Has not had any dizziness fall or loss of consciousness.  Patient was admitted to [facility] long.  Work-up is been negative for encephalopathic process.  She was subsequently transferred to [facility] for further evaluation as noted below.  ED Course:In the ER patient EKG shows sinus bradycardia with a heart rate around 54 bpm. Nonspecific ST-T changes. Labs reveal sodium of 123 which is markedly low from her previous. On exam patient has bilateral crepitations with lower extremity edema. Chest x-ray shows peripheral congestion. Patient's hemoglobin is at baseline. But does have some leukopenia. Patient is afebrile. Initially patient was given fluids in the ER by ER physician but given that patient has been likely CHF, now havediscontinued the fluids and start patient on Lasix.  09/27/2018: Patient transferred to [facility].patient's mental status continues to wax and wane. Patient is currently awake but nonverbal, confused, unable to also questions. Family's is always by the bedside. All questions and concerns have been addressed. Patient continues to decline despite extensive testing and interventions. Prognosis remains guarded.Neurology consulted as last resort ,recommed LTM EEG, and LP, transfer to [facility] per neuro recommendations   Assessment &  Plan:   Principal Problem:   Sepsis ([place]) Active Problems:   Hyponatremia   Hypothyroidism   Hypothermia   Acute encephalopathy   Normocytic anemia   Leukopenia   CKD (chronic kidney disease), stage IV ([place])   DNR (do not resuscitate) discussion   Acute respiratory failure ([place])   Acute encephalopathy, probably combined toxic and metabolic encephalopathy. Patient remains encephalopathic and tenuous with altered mental status.  Etiology is thought to be multifocal initially to include underlying infection complicated hyponatremia and adult failure to thrive.  She was initially seen by speech at [facility] long with recommendations for nectar thick liquids but patient now is not interacting unable to take p.o. medications.  Had extensive family discussion day with daughter who is a physician in [place] as well as family members at bedside.  Discussed patient's work-up which to date has shown negative respiratory virus panel CT and MRI without acute findings although atrophy identified, EEG showing encephalopathic process, LP without evidence of infection with clear fluid and 1 white blood cell as well as negative Gram stain and culture.  Discussed plan of care patient family is undecided at this point time and wanted proceed with providing care until daughter flies in from the [place] tomorrow for family meeting Monday for possible PEG tube.  Pneumonia suspect gram-negative organism.  As previously noted chest x-ray from 1/29 showed right-sided haziness consistent with infiltrate patient received meropenem then cefepime.  She completed a 5-day course with a negative procalcitonin negative respiratory panel normal white blood cell count afebrile.  No evidence of infection and  Hypothyroid continuing IV Synthroid at this point time  Hypernatremia mildly improved with hydration D5 quarter normal down to 150.  Patient serum osmole's were 347 prior to hydration.  Unfortunately because  of patient's CHF with  diastolic dysfunction chest x-ray showing edema will have to discontinue fluids today,  1 dose of Lasix.  Chronic dysphasia.  Suspect is underlying element of aspiration.  Patient was seen by speech as noted and they recommended nectar thick liquids unfortunately patient unable to tolerate any p.o. intake now discussed with the family PEG tube they want to wait till Monday to make a decision.  Acute on chronic kidney disease.  Patient reported creatinine baseline is 1.3-1.6.  On transfer from was a long was noted to creatinine been climbing from 2.2-2.7.  A plateaued at 2.7 and has declined slightly with hydration.  Treatment complicated by patient's CHF.  I discussed the case with nephrology now the family wants to pursue more aggressive care they will see in consultation today.  Hypertensive urgency.  Patient had an episode of hypertensive urgency pre-transfer.  Currently r we are using PRN antihypertensives   DVT prophylaxis: SCD/Compression stockings  Code Status: Full    Code Status Orders  (From admission, onward)         Start     Ordered   09/26/18 0946  Full code  Continuous     09/26/18 0947        Code Status History    Date Active Date Inactive Code Status Order ID Comments User Context   09/24/2018 1001 09/26/2018 0947 DNR [id number]  Roney Jaffe, MD ED   09/24/2018 0423 09/24/2018 1001 Full Code [id number]  Rise Patience, MD ED   10/29/2015 0046 11/05/2015 1331 Full Code [id number]  Eugenie Filler, MD Inpatient   08/08/2015 0432 08/13/2015 1739 Full Code [id number]  Edwin Dada, MD Inpatient   02/13/2015 0552 02/18/2015 1505 Full Code [id number]  Ivor Costa, MD Inpatient   10/31/2014 1332 11/03/2014 1820 Full Code [id number]  Orson Eva, MD Inpatient     Family Communication: Son Disposition Plan:   Unclear at this point Consults called: None Admission status: Inpatient   Consultants:   Neurology, nephrology  Procedures:  Dg Chest 2 View  Result  Date: 09/24/2018 CLINICAL DATA:  Shortness of breath. EXAM: CHEST - 2 VIEW COMPARISON:  06/22/2018 and 10/16/2017 FINDINGS: There is new pulmonary vascular congestion. There is new peripheral haziness in the right lung cyst pulmonary edema. No discrete effusions. Heart is within normal limits of size considering the AP portable technique. Aortic atherosclerosis.  No acute bone abnormalities. IMPRESSION: New pulmonary vascular congestion with slight peripheral pulmonary edema on the right. Electronically Signed   By: Lorriane Shire M.D.   On: 09/24/2018 04:12   Dg Abd 1 View  Result Date: 09/28/2018 CLINICAL DATA:  Ileus EXAM: ABDOMEN - 1 VIEW COMPARISON:  09/27/2018 FINDINGS: Nonobstructive bowel gas pattern. Mild residual contrast within ascending and transverse colon. No colonic dilatation. Degenerative changes of the lumbar spine. IMPRESSION: Unremarkable abdominal radiograph. Electronically Signed   By: Julian Hy M.D.   On: 09/28/2018 05:08   Dg Abd 1 View  Result Date: 09/27/2018 CLINICAL DATA:  Encounter for feeding tube placement. EXAM: ABDOMEN - 1 VIEW COMPARISON:  Radiograph of same day.  CT scan of June 22, 2018. FINDINGS: No small bowel dilatation is noted. Residual contrast is seen in the colon. Colonic dilatation is noted suggesting possible ileus. No abnormal calcifications are noted. IMPRESSION: Colonic dilatation is noted in left side of abdomen concerning for possible ileus. Electronically Signed   By: Marijo Conception, M.D.   On: 09/27/2018 12:21   Ct Head Wo Contrast  Result Date: 09/27/2018 CLINICAL DATA:  Neurological deficits with involuntary movements. EXAM: CT HEAD WITHOUT CONTRAST TECHNIQUE: Contiguous axial images were obtained from the base of the skull through the vertex without intravenous contrast. COMPARISON:  09/24/2018 FINDINGS: Brain: Diffuse cerebral atrophy. Ventricular dilatation consistent with central atrophy. Low-attenuation changes in the deep white matter  consistent with small vessel ischemia. Old parenchymal calcifications in the basal ganglia and left cerebellum without change. No mass effect or midline shift. No abnormal extra-axial fluid collections. Gray-white matter junctions are distinct. Basal cisterns are not effaced. No acute intracranial hemorrhage. Vascular: Prominent intracranial arterial vascular calcifications are present. Skull: Calvarium appears intact. No acute displaced fractures identified. Sinuses/Orbits: Paranasal sinuses and mastoid air cells are clear. Other: No significant change since prior study. IMPRESSION: No acute intracranial abnormalities. Chronic atrophy and small vessel ischemic changes. Electronically Signed   By: Lucienne Capers M.D.   On: 09/27/2018 03:56   Ct Head Wo Contrast  Result Date: 09/24/2018 CLINICAL DATA:  78 y/o  F; 3 days of weakness. EXAM: CT HEAD WITHOUT CONTRAST TECHNIQUE: Contiguous axial images were obtained from the base of the skull through the vertex without intravenous contrast. COMPARISON:  10/16/2017 CT head. FINDINGS: Brain: No evidence of acute infarction, hemorrhage, hydrocephalus, extra-axial collection or mass lesion/mass effect. Stable dystrophic calcifications in the basal ganglia and calcifications in the left cerebellar hemisphere. 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 Stable chronic microvascular ischemic changes and volume loss of the brain. Vascular: Calcific atherosclerosis of carotid siphons. No hyperdense vessel identified. Skull: Normal. Negative for fracture or focal lesion. Sinuses/Orbits: No acute finding. Other: Bilateral intra-ocular lens replacement. IMPRESSION: 1. No acute intracranial abnormality identified. 2. Stable chronic microvascular ischemic changes and volume loss of the brain. Stable small chronic lacunar infarct in the right caudate nucleus. Electronically Signed   By: Kristine Garbe M.D.   On: 09/24/2018 05:15   Mr Brain Wo  Contrast  Result Date: 09/29/2018 CLINICAL DATA:  Acute encephalopathy. Jerking movements. Progressive weakness. EXAM: MRI HEAD WITHOUT CONTRAST TECHNIQUE: Multiplanar, multiecho pulse sequences of the brain and surrounding structures were obtained without intravenous contrast. COMPARISON:  Head CT 09/27/2018 and MRI 08/08/2015 FINDINGS: Brain: There is no evidence of acute infarct, intracranial hemorrhage, mass, midline shift, or extra-axial fluid collection. Patchy to confluent T2 hyperintensities in the cerebral white matter are slightly progressed from the prior MRI and are nonspecific but compatible with moderate chronic small vessel ischemic disease. Mild T2 hyperintensity in the basal ganglia and thalami is similar to the prior MRI. 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 There is a small remote insult in the left cerebellum with associated calcification on CT. Vascular: Major intracranial vascular flow voids are preserved. Skull and upper cervical spine: Unremarkable bone marrow signal. Sinuses/Orbits: Bilateral cataract extraction. Clear paranasal sinuses. Trace mastoid effusions. Other: None. IMPRESSION: 1. No acute intracranial abnormality. 2. Moderate chronic small vessel ischemic disease and cerebral atrophy. Electronically Signed   By: Logan Bores M.D.   On: 09/29/2018 13:22   Dg Chest Port 1 View  Result Date: 10/04/2018 CLINICAL DATA:  Shortness of Breath EXAM: PORTABLE CHEST 1 VIEW COMPARISON:  September 30, 2018 FINDINGS: There are pleural effusions bilaterally. There appears to be loculated pleural effusion on the left, a change from previous study. There is cardiomegaly with pulmonary venous hypertension. There is a degree of underlying interstitial edema. There is aortic atherosclerosis. No bone lesions evident. IMPRESSION: Pulmonary vascular congestion  with interstitial edema and pleural effusions. There is apparent loculated effusion on the  left. A degree of superimposed airspace consolidation in this area can not be excluded. The overall appearance is felt to be most consistent with congestive heart failure. There is aortic atherosclerosis. Electronically Signed   By: Lowella Grip III M.D.   On: 10/04/2018 10:29   Dg Chest Port 1 View  Result Date: 09/30/2018 CLINICAL DATA:  Fever. EXAM: PORTABLE CHEST 1 VIEW COMPARISON:  Chest x-ray 09/27/2018. FINDINGS: Cardiomegaly with bilateral interstitial prominence. Improved aeration from prior exam. Persistent right lower lobe alveolar infiltrate. This could represent asymmetric pulmonary edema and/or pneumonia. Small bilateral pleural effusions. IMPRESSION: 1. Cardiomegaly with bilateral interstitial prominence, improved aeration from prior exam. Findings suggesting improving CHF. Small bilateral pleural effusions. 2. Persistent right base infiltrate. This could represent asymmetric pulmonary edema and/or pneumonia. Similar findings on prior exam. Electronically Signed   By: [place]   On: 09/30/2018 13:34   Dg Chest Port 1 View  Result Date: 09/27/2018 CLINICAL DATA:  Acute respiratory failure. EXAM: PORTABLE CHEST 1 VIEW COMPARISON:  Chest x-ray dated September 25, 2018. FINDINGS: The patient is rotated to the left, limiting evaluation. Stable cardiomegaly. Unchanged mild interstitial edema, small bilateral pleural effusions, and bibasilar atelectasis. Apparent increased density at the right lung base is likely due to rotation. No pneumothorax. No acute osseous abnormality. IMPRESSION: 1. Unchanged mild interstitial edema, small bilateral pleural effusions, and bibasilar atelectasis. Electronically Signed   By: Titus Dubin M.D.   On: 09/27/2018 10:33   Dg Chest Port 1 View  Result Date: 09/25/2018 CLINICAL DATA:  Follow-up swallow study. EXAM: PORTABLE CHEST 1 VIEW COMPARISON:  Radiographs of September 24, 2018. FINDINGS: Stable cardiomegaly is noted. Atherosclerosis of thoracic  aorta is noted. No pneumothorax is noted. Minimal bibasilar subsegmental atelectasis is noted with minimal pleural effusions. Bony thorax is unremarkable. Triangular area of increased density is seen in the region of the upper thoracic spine; is uncertain if this represents retained contrast from prior swallowing study. Rounded barium tablet is not clearly identified. IMPRESSION: Minimal bibasilar subsegmental atelectasis is noted with minimal pleural effusions. Triangular area of increased density is seen projected over upper thoracic spine which potentially may represent residual contrast from prior swallowing study. Aortic Atherosclerosis (ICD10-I70.0). Electronically Signed   By: Marijo Conception, M.D.   On: 09/25/2018 13:18   Dg Swallowing Func-speech Pathology  Result Date: 09/25/2018 Objective Swallowing Evaluation: Type of Study: MBS-Modified Barium Swallow Study  Patient Details Name: EMMRY HINSCH MRN: [id number] Date of Birth: [birth year] Today's Date: 09/25/2018 Time: SLP Start Time (ACUTE ONLY): 0750 -SLP Stop Time (ACUTE ONLY): 0804 SLP Time Calculation (min) (ACUTE ONLY): 14 min Past Medical History: Past Medical History: Diagnosis Date . Adrenal insufficiency ([place])  . Adrenal insufficiency ([place])   Dx'd [facility] 05/2015 . Asthma  . CHF (congestive heart failure) ([place])  . GERD (gastroesophageal reflux disease)  . Glaucoma  . Headache  . HTN (hypertension)  . Hypertension  . Hypothyroidism  . Normocytic anemia 09/07/2018 . Shortness of breath dyspnea  . Thyroid disease   not know in detail Past Surgical History: Past Surgical History: Procedure Laterality Date . APPENDECTOMY   . BACK SURGERY   . CAPSULOTOMY  08/23/2011  Procedure: MINOR CAPSULOTOMY;  Surgeon: Myrtha Mantis.;  Location: [place];  Service: Ophthalmology;  Laterality: Left;  YAG Capsulotomy Left Eye . ESOPHAGOGASTRODUODENOSCOPY N/A 03/17/2013  Procedure: ESOPHAGOGASTRODUODENOSCOPY (EGD);  Surgeon: Irene Shipper, MD;  Location:  [place]  ENDOSCOPY;  Service: Endoscopy;  Laterality: N/A; . LEFT CATARACT EXTRACTION    2007 . RIGHT CATARACT EXTRACTION    2006 HPI: pt is a 78 yo female adm to [facility] with AMS x3 days, CT head + basal ganglia and left cerebellar calcification, 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  Pt with h/o CHF, renal dysfunction. Swallow evauluation ordered as pt had difficulty swallowing a pill this am.  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   Subjective: pt awake in chair Assessment / Plan / Recommendation CHL IP CLINICAL IMPRESSIONS 09/25/2018 Clinical Impression Pt's swallow function was inconsistent throughout MBS - neck extension posture significantly worsened pharyngeal contraction and thus resulted in gross residuals of puree/thin. Oral deficits significant mostly with liquids resulting in liquids spilling into pharynx not controlled and resulting in aspiration before the swallow with nectar.  Aspiration after the swallow from pyriform sinus spillage did elicit a delayed cough but not adequately strong to clear pharynx.  Chin tuck posture improved airway protection significantly and prevented aspiration as well as decreased pharyngeal residuals.  Cued dry swallows helpful to clear oral residuals.  Of note, pt appeared with significant stasis in proximal esophagus without sensation *just below UES without awareness.  Pudding consistency with tablet did not clear this area despite follow up liquid swallows.  Dependent on family's goal for pt, consideration for GI referral recommended given son's report of chronicity of dysphagia for "a long time".  Recommend to start full liquid diet *nectar* and allow thin water between meals. Use straw to faciliate chin down posture for pt.  Son present and wishes to interpret *not ipad interpreter* therefore allowed in xray.  SLP will follow up for pt tolerance, family education.   SLP Visit Diagnosis Dysphagia, oropharyngeal phase  (R13.12);Dysphagia, pharyngoesophageal phase (R13.14) Attention and concentration deficit following -- Frontal lobe and executive function deficit following -- Impact on safety and function Moderate aspiration risk;Risk for inadequate nutrition/hydration   CHL IP TREATMENT RECOMMENDATION 09/25/2018 Treatment Recommendations Therapy as outlined in treatment plan below   Prognosis 09/25/2018 Prognosis for Safe Diet Advancement Fair Barriers to Reach Goals Time post onset Barriers/Prognosis Comment -- CHL IP DIET RECOMMENDATION 09/25/2018 SLP Diet Recommendations Nectar thick liquid Liquid Administration via -- Medication Administration Crushed with puree Compensations Slow rate;Small sips/bites Postural Changes --   CHL IP OTHER RECOMMENDATIONS 09/25/2018 Recommended Consults Consider GI evaluation Oral Care Recommendations Oral care before and after PO Other Recommendations Order thickener from pharmacy;Have oral suction available   CHL IP FOLLOW UP RECOMMENDATIONS 09/25/2018 Follow up Recommendations (No Data)   CHL IP FREQUENCY AND DURATION 09/25/2018 Speech Therapy Frequency (ACUTE ONLY) min 2x/week Treatment Duration 1 week      CHL IP ORAL PHASE 09/25/2018 Oral Phase Impaired Oral - Pudding Teaspoon -- Oral - Pudding Cup -- Oral - Honey Teaspoon -- Oral - Honey Cup -- Oral - Nectar Teaspoon Weak lingual manipulation;Decreased bolus cohesion;Reduced posterior propulsion;Premature spillage Oral - Nectar Cup -- Oral - Nectar Straw Weak lingual manipulation;Decreased bolus cohesion;Reduced posterior propulsion;Premature spillage;Delayed oral transit Oral - Thin Teaspoon Decreased bolus cohesion;Weak lingual manipulation;Reduced posterior propulsion;Premature spillage;Lingual/palatal residue;Delayed oral transit Oral - Thin Cup -- Oral - Thin Straw Reduced posterior propulsion;Weak lingual manipulation;Decreased bolus cohesion;Premature spillage;Lingual/palatal residue;Delayed oral transit Oral - Puree Decreased bolus  cohesion;Weak lingual manipulation;Lingual pumping;Premature spillage;Lingual/palatal residue;Delayed oral transit Oral - Mech Soft Decreased bolus cohesion;Premature spillage;Delayed oral transit Oral - Regular -- Oral - Multi-Consistency --  Oral - Pill Decreased bolus cohesion;Premature spillage;Weak lingual manipulation;Lingual pumping;Delayed oral transit Oral Phase - Comment --  CHL IP PHARYNGEAL PHASE 09/25/2018 Pharyngeal Phase Impaired Pharyngeal- Pudding Teaspoon -- Pharyngeal -- Pharyngeal- Pudding Cup -- Pharyngeal -- Pharyngeal- Honey Teaspoon -- Pharyngeal -- Pharyngeal- Honey Cup -- Pharyngeal -- Pharyngeal- Nectar Teaspoon -- Pharyngeal -- Pharyngeal- Nectar Cup -- Pharyngeal -- Pharyngeal- Nectar Straw WFL Pharyngeal -- Pharyngeal- Thin Teaspoon Penetration/Aspiration before swallow Pharyngeal Material enters airway, passes BELOW cords without attempt by patient to eject out (silent aspiration) Pharyngeal- Thin Cup -- Pharyngeal -- Pharyngeal- Thin Straw Penetration/Apiration after swallow;Pharyngeal residue - valleculae;Pharyngeal residue - pyriform Pharyngeal Material enters airway, passes BELOW cords without attempt by patient to eject out (silent aspiration);Material enters airway, passes BELOW cords and not ejected out despite cough attempt by patient Pharyngeal- Puree Reduced tongue base retraction;Reduced airway/laryngeal closure;Reduced laryngeal elevation;Pharyngeal residue - valleculae;Pharyngeal residue - pyriform;Reduced pharyngeal peristalsis Pharyngeal -- Pharyngeal- Mechanical Soft Reduced tongue base retraction;Pharyngeal residue - valleculae Pharyngeal -- Pharyngeal- Regular -- Pharyngeal -- Pharyngeal- Multi-consistency -- Pharyngeal -- Pharyngeal- Pill Pharyngeal residue - valleculae Pharyngeal -- Pharyngeal Comment pt with gross residuals in pharynx intermittently - most notably with head extended upward with swallow, dry swallow attempts are essentially not initiated as very  minimal movement observed, aspiration of thin occured after the swallow as pyriform sinus residual spill into open airway - posterior trach,   CHL IP CERVICAL ESOPHAGEAL PHASE 09/25/2018 Cervical Esophageal Phase Impaired Pudding Teaspoon -- Pudding Cup -- Honey Teaspoon -- Honey Cup -- Nectar Teaspoon -- Nectar Cup -- Nectar Straw -- Thin Teaspoon -- Thin Cup -- Thin Straw -- Puree -- Mechanical Soft -- Regular -- Multi-consistency -- Pill -- Cervical Esophageal Comment pt appeared with significant residuals below UES WITHOUT awareness, much more prominent with puree than liquids Macario Golds 09/25/2018, 10:45 AM Luanna Salk, MS [facility] SLP Acute Rehab Services Pager [phone] Office [phone]              Vas [place] Lower Extremity Venous (dvt)  Result Date: 09/24/2018  Lower Venous Study Indications: Edema.  Performing Technologist: Oliver Hum RVT  Examination Guidelines: A complete evaluation includes B-mode imaging, spectral Doppler, color Doppler, and power Doppler as needed of all accessible portions of each vessel. Bilateral testing is considered an integral part of a complete examination. Limited examinations for reoccurring indications may be performed as noted.  Right Venous Findings: +---------+---------------+---------+-----------+----------+-------+          CompressibilityPhasicitySpontaneityPropertiesSummary +---------+---------------+---------+-----------+----------+-------+ CFV      Full           Yes      Yes                          +---------+---------------+---------+-----------+----------+-------+ SFJ      Full                                                 +---------+---------------+---------+-----------+----------+-------+ FV Prox  Full                                                 +---------+---------------+---------+-----------+----------+-------+ FV Mid   Full                                                  +---------+---------------+---------+-----------+----------+-------+  FV DistalFull                                                 +---------+---------------+---------+-----------+----------+-------+ PFV      Full                                                 +---------+---------------+---------+-----------+----------+-------+ POP      Full           Yes      Yes                          +---------+---------------+---------+-----------+----------+-------+ PTV      Full                                                 +---------+---------------+---------+-----------+----------+-------+ PERO     Full                                                 +---------+---------------+---------+-----------+----------+-------+  Left Venous Findings: +---------+---------------+---------+-----------+----------+-------+          CompressibilityPhasicitySpontaneityPropertiesSummary +---------+---------------+---------+-----------+----------+-------+ CFV      Full           Yes      Yes                          +---------+---------------+---------+-----------+----------+-------+ SFJ      Full                                                 +---------+---------------+---------+-----------+----------+-------+ FV Prox  Full                                                 +---------+---------------+---------+-----------+----------+-------+ FV Mid   Full                                                 +---------+---------------+---------+-----------+----------+-------+ FV DistalFull                                                 +---------+---------------+---------+-----------+----------+-------+ PFV      Full                                                 +---------+---------------+---------+-----------+----------+-------+  POP      Full           Yes      Yes                           +---------+---------------+---------+-----------+----------+-------+ PTV      Full                                                 +---------+---------------+---------+-----------+----------+-------+ PERO     Full                                                 +---------+---------------+---------+-----------+----------+-------+    Summary: Right: There is no evidence of deep vein thrombosis in the lower extremity. No cystic structure found in the popliteal fossa. Left: There is no evidence of deep vein thrombosis in the lower extremity. No cystic structure found in the popliteal fossa.  *See table(s) above for measurements and observations. Electronically signed by Monica Martinez MD on 09/24/2018 at 3:52:06 PM.    Final      Antimicrobials:   None   Subjective: Patient still remains obtunded with minimal interaction.  Family seems to feel patient is interacting although none of this is been witnessed by staff  Objective: Vitals:   10/05/18 0353 10/05/18 0725 10/05/18 0929 10/05/18 1100  BP: (!) 175/61 (!) 178/69  (!) 179/70  Pulse: 72 77  82  Resp: (!) 22   20  Temp: 98.6 F (37 C) 98.6 F (37 C)  98.4 F (36.9 C)  TempSrc: Axillary Axillary  Axillary  SpO2: 99%  99% 97%  Weight:      Height:        Intake/Output Summary (Last 24 hours) at 10/05/2018 1212 Last data filed at 10/05/2018 0654 Gross per 24 hour  Intake 1600 ml  Output 600 ml  Net 1000 ml   Filed Weights   09/27/18 0500 09/28/18 0500 10/01/18 0500  Weight: 52.4 kg 52 kg 52.9 kg    Examination:  General exam: Not interactive Respiratory system: Clear to auscultation. Respiratory effort normal. Cardiovascular system: S1 & S2 heard, RRR. No JVD, murmurs, rubs, gallops or clicks. No pedal edema. Gastrointestinal system: Abdomen is nondistended, soft and nontender. No organomegaly or masses felt. Normal bowel sounds heard. Central nervous system: No blink reflex not interactive not responding to  name Extremities: Symmetric 5 x 5 power. Skin: No rashes, lesions or ulcers Psychiatry: Unable to assess secondary to obtundation.     Data Reviewed: I have personally reviewed following labs and imaging studies  CBC: Recent Labs  Lab 09/29/18 0354 10/02/18 0815 10/04/18 1015 10/05/18 0627  WBC 4.8 4.2 4.3 4.4  NEUTROABS 3.5 2.8 2.8 3.0  HGB 8.2* 8.4* 8.9* 9.1*  HCT 25.5* 26.6* 28.5* 29.0*  MCV 97.3 95.3 96.9 95.1  PLT 167 205 184 801   Basic Metabolic Panel: Recent Labs  Lab 09/29/18 0354  10/02/18 1714 10/03/18 0804 10/03/18 1619 10/04/18 1015 10/05/18 0627  NA 145   < > 156* 154* 154* 150* 149*  K 3.8   < > 3.5 3.3* 3.4* 3.8 4.1  CL 117*   < > 123* 126* 126* 123* 123*  CO2 17*   < > 18* 19* 20* 17* 18*  GLUCOSE 71   < > 145* 184* 155* 156* 111*  BUN 48*   < > 76* 75* 74* 69* 66*  CREATININE 2.21*   < > 2.71* 2.62* 2.55* 2.38* 2.34*  CALCIUM 8.4*   < > 7.9* 8.0* 8.0* 8.2* 8.2*  MG 2.3  --   --   --   --   --  2.0  PHOS 4.6  --   --   --   --   --   --    < > = values in this interval not displayed.   GFR: Estimated Creatinine Clearance: 14.7 mL/min (A) (by C-G formula based on SCr of 2.34 mg/dL (H)). Liver Function Tests: Recent Labs  Lab 09/29/18 0354 09/30/18 0258 10/01/18 0948 10/04/18 1015  AST  --  45* 43* 31  ALT  --  25 24 17   ALKPHOS  --  59 57 53  BILITOT  --  1.4* 0.9 0.9  PROT  --  6.1* 5.9* 5.6*  ALBUMIN 2.3* 2.4* 2.1* 1.9*   No results for input(s): LIPASE, AMYLASE in the last 168 hours. No results for input(s): AMMONIA in the last 168 hours. Coagulation Profile: Recent Labs  Lab 10/03/18 1215  INR 1.13   Cardiac Enzymes: No results for input(s): CKTOTAL, CKMB, CKMBINDEX, TROPONINI in the last 168 hours. BNP (last 3 results) No results for input(s): PROBNP in the last 8760 hours. HbA1C: No results for input(s): HGBA1C in the last 72 hours. CBG: No results for input(s): GLUCAP in the last 168 hours. Lipid Profile: No results  for input(s): CHOL, HDL, LDLCALC, TRIG, CHOLHDL, LDLDIRECT in the last 72 hours. Thyroid Function Tests: No results for input(s): TSH, T4TOTAL, FREET4, T3FREE, THYROIDAB in the last 72 hours. Anemia Panel: No results for input(s): VITAMINB12, FOLATE, FERRITIN, TIBC, IRON, RETICCTPCT in the last 72 hours. Sepsis Labs: Recent Labs  Lab 09/29/18 0354  PROCALCITON 0.36    Recent Results (from the past 240 hour(s))  MRSA PCR Screening     Status: None   Collection Time: 09/27/18  9:44 AM  Result Value Ref Range Status   MRSA by PCR NEGATIVE NEGATIVE Final    Comment:        The GeneXpert MRSA Assay (FDA approved for NASAL specimens only), is one component of a comprehensive MRSA colonization surveillance program. It is not intended to diagnose MRSA infection nor to guide or monitor treatment for MRSA infections. Performed at [facility], [place] [address] [place], [place] [address]   Respiratory Panel by PCR     Status: None   Collection Time: 09/29/18  2:22 PM  Result Value Ref Range Status   Adenovirus NOT DETECTED NOT DETECTED Final   Coronavirus 229E NOT DETECTED NOT DETECTED Final    Comment: (NOTE) The Coronavirus on the Respiratory Panel, DOES NOT test for the novel  Coronavirus (2019 nCoV)    Coronavirus HKU1 NOT DETECTED NOT DETECTED Final   Coronavirus NL63 NOT DETECTED NOT DETECTED Final   Coronavirus OC43 NOT DETECTED NOT DETECTED Final   Metapneumovirus NOT DETECTED NOT DETECTED Final   Rhinovirus / Enterovirus NOT DETECTED NOT DETECTED Final   Influenza A NOT DETECTED NOT DETECTED Final   Influenza B NOT DETECTED NOT DETECTED Final   Parainfluenza Virus 1 NOT DETECTED NOT DETECTED Final   Parainfluenza Virus 2 NOT DETECTED NOT DETECTED Final   Parainfluenza Virus 3 NOT DETECTED NOT DETECTED  Final   Parainfluenza Virus 4 NOT DETECTED NOT DETECTED Final   Respiratory Syncytial Virus NOT DETECTED NOT DETECTED Final   Bordetella pertussis NOT  DETECTED NOT DETECTED Final   Chlamydophila pneumoniae NOT DETECTED NOT DETECTED Final   Mycoplasma pneumoniae NOT DETECTED NOT DETECTED Final    Comment: Performed at [facility], [place] [address] [place], [place] [address]  Culture, blood (Routine X 2) w Reflex to ID Panel     Status: None (Preliminary result)   Collection Time: 09/30/18 11:36 AM  Result Value Ref Range Status   Specimen Description   Final    BLOOD LEFT HAND Performed at [facility] [address] [place], [place] [address]    Special Requests   Final    BOTTLES DRAWN AEROBIC AND ANAEROBIC Blood Culture adequate volume Performed at [facility] [address] [place], [place] [address]    Culture   Final    NO GROWTH 4 DAYS Performed at [facility], [place] [address], [place], [place] [address]    Report Status PENDING  Incomplete  Culture, blood (Routine X 2) w Reflex to ID Panel     Status: None (Preliminary result)   Collection Time: 09/30/18 11:37 AM  Result Value Ref Range Status   Specimen Description   Final    BLOOD RIGHT HAND Performed at [facility] [address] [place], [place] [address]    Special Requests   Final    BOTTLES DRAWN AEROBIC AND ANAEROBIC Blood Culture adequate volume Performed at [facility] [address], [place], [place] [address]    Culture   Final    NO GROWTH 4 DAYS Performed at [facility], [place] [address], [place], [place] [address]    Report Status PENDING  Incomplete  Urine Culture     Status: None   Collection Time: 09/30/18 12:38 PM  Result Value Ref Range Status   Specimen Description   Final    URINE, RANDOM Performed at [facility] [address], [place], [place] [address]    Special Requests   Final    NONE Performed at [facility], [place] [address] [place], [place] [address]    Culture   Final    NO  GROWTH Performed at [facility], [place] [address], [place], [place] [address]    Report Status 10/01/2018 FINAL  Final  CSF culture with Stat gram stain     Status: None   Collection Time: 10/01/18  9:55 AM  Result Value Ref Range Status   Specimen Description CSF  Final   Special Requests Normal  Final   Gram Stain   Final    CYTOSPIN SMEAR WBC PRESENT, PREDOMINANTLY MONONUCLEAR NO ORGANISMS SEEN    Culture   Final    NO GROWTH 3 DAYS Performed at [facility], [place] [address] [place],  [address]    Report Status 10/04/2018 FINAL  Final  Fungus Culture With Stain     Status: None (Preliminary result)   Collection Time: 10/01/18  9:55 AM  Result Value Ref Range Status   Fungus Stain Final report  Final    Comment: (NOTE) Performed At: [facility] [place], [place] [id number] [facility] MD [id number]    Fungus (Mycology) Culture PENDING  Incomplete   Fungal Source CSF  Final    Comment: Performed at [facility], [place] [address], [place], [place] [address]  Fungus Culture Result     Status: None   Collection Time: 10/01/18  9:55 AM  Result Value Ref Range Status   Result 1 Comment  Final    Comment: (NOTE) KOH/Calcofluor preparation:  no fungus observed. Performed At: [facility] [place], [place] [id number] [facility] MD [id number]          Radiology Studies: Dg Chest Port 1 View  Result Date: 10/04/2018 CLINICAL DATA:  Shortness of Breath EXAM: PORTABLE CHEST 1 VIEW COMPARISON:  September 30, 2018 FINDINGS: There are pleural effusions bilaterally. There appears to be loculated pleural effusion on the left, a change from previous study. There is cardiomegaly with pulmonary venous hypertension. There is a degree of underlying interstitial edema. There is aortic atherosclerosis. No bone lesions evident. IMPRESSION: Pulmonary vascular congestion with interstitial edema and pleural effusions.  There is apparent loculated effusion on the left. A degree of superimposed airspace consolidation in this area can not be excluded. The overall appearance is felt to be most consistent with congestive heart failure. There is aortic atherosclerosis. Electronically Signed   By: Lowella Grip III M.D.   On: 10/04/2018 10:29        Scheduled Meds: . budesonide (PULMICORT) nebulizer solution  0.25 mg Nebulization BID  . chlorhexidine  15 mL Mouth Rinse BID  . cloNIDine  0.3 mg Transdermal Weekly  . ipratropium-albuterol  3 mL Nebulization BID  . levothyroxine  37.5 mcg Intravenous Daily  . LORazepam  2 mg Intravenous Once  . mouth rinse  15 mL Mouth Rinse q12n4p  . metoprolol tartrate  10 mg Intravenous Q6H   Continuous Infusions: . dextrose 5 % with KCl 20 mEq / L 20 mEq (10/04/18 1806)     LOS: 11 days    Time spent: 4 min    Nicolette Bang, MD [facility]  If 7PM-7AM, please contact night-coverage  10/05/2018, 12:12 PM

## 2018-10-05 NOTE — Progress Notes (Signed)
[facility]    NEPHROLOGY PROGRESS NOTE  SUBJECTIVE: Lethargic, unable to provide review of systems.  No acute events.  OBJECTIVE:  Vitals:   10/05/18 1100 10/05/18 1519  BP: (!) 179/70 (!) 173/69  Pulse: 82 71  Resp: 20 19  Temp: 98.4 F (36.9 C)   SpO2: 97% 99%    Intake/Output Summary (Last 24 hours) at 10/05/2018 1611 Last data filed at 10/05/2018 1500 Gross per 24 hour  Intake 1505 ml  Output 600 ml  Net 905 ml      General: Eyes open, does not answer questions.  NAD HEENT: MMM [place] AT anicteric sclera Neck:  No JVD, no adenopathy CV:  Heart RRR  Lungs:  L/S coarse bilaterally Abd:  abd SNT/ND with normal BS GU:  Bladder non-palpable Extremities:  No LE edema. Skin:  No skin rash  MEDICATIONS:  . budesonide (PULMICORT) nebulizer solution  0.25 mg Nebulization BID  . chlorhexidine  15 mL Mouth Rinse BID  . cloNIDine  0.3 mg Transdermal Weekly  . ipratropium-albuterol  3 mL Nebulization BID  . levothyroxine  37.5 mcg Intravenous Daily  . LORazepam  2 mg Intravenous Once  . mouth rinse  15 mL Mouth Rinse q12n4p  . metoprolol tartrate  10 mg Intravenous Q6H       LABS:   CBC Latest Ref Rng & Units 10/05/2018 10/04/2018 10/02/2018  WBC 4.0 - 10.5 K/uL 4.4 4.3 4.2  Hemoglobin 12.0 - 15.0 g/dL 9.1(L) 8.9(L) 8.4(L)  Hematocrit 36.0 - 46.0 % 29.0(L) 28.5(L) 26.6(L)  Platelets 150 - 400 K/uL 190 184 205    CMP Latest Ref Rng & Units 10/05/2018 10/04/2018 10/03/2018  Glucose 70 - 99 mg/dL 111(H) 156(H) 155(H)  BUN 8 - 23 mg/dL 66(H) 69(H) 74(H)  Creatinine 0.44 - 1.00 mg/dL 2.34(H) 2.38(H) 2.55(H)  Sodium 135 - 145 mmol/L 149(H) 150(H) 154(H)  Potassium 3.5 - 5.1 mmol/L 4.1 3.8 3.4(L)  Chloride 98 - 111 mmol/L 123(H) 123(H) 126(H)  CO2 22 - 32 mmol/L 18(L) 17(L) 20(L)  Calcium 8.9 - 10.3 mg/dL 8.2(L) 8.2(L) 8.0(L)  Total Protein 6.5 - 8.1 g/dL - 5.6(L) -  Total Bilirubin 0.3 - 1.2 mg/dL - 0.9 -  Alkaline Phos 38 - 126 U/L - 53 -  AST 15 - 41 U/L - 31 -   ALT 0 - 44 U/L - 17 -    Lab Results  Component Value Date   CALCIUM 8.2 (L) 10/05/2018   CAION 1.19 08/07/2015   PHOS 4.6 09/29/2018       Component Value Date/Time   COLORURINE AMBER (A) 09/28/2018 0759   APPEARANCEUR CLOUDY (A) 09/28/2018 0759   LABSPEC 1.015 09/28/2018 0759   PHURINE 6.0 09/28/2018 0759   GLUCOSEU NEGATIVE 09/28/2018 0759   HGBUR LARGE (A) 09/28/2018 0759   BILIRUBINUR NEGATIVE 09/28/2018 0759   KETONESUR NEGATIVE 09/28/2018 0759   PROTEINUR 100 (A) 09/28/2018 0759   UROBILINOGEN 1.0 06/08/2015 1724   NITRITE NEGATIVE 09/28/2018 0759   LEUKOCYTESUR MODERATE (A) 09/28/2018 0759      Component Value Date/Time   PHART 7.371 09/27/2018 0902   PCO2ART 32.3 09/27/2018 0902   PO2ART 104 09/27/2018 0902   HCO3 18.2 (L) 09/27/2018 0902   TCO2 22 08/07/2015 2329   ACIDBASEDEF 5.8 (H) 09/27/2018 0902   O2SAT 97.8 09/27/2018 0902       Component Value Date/Time   IRON 144 09/24/2018 0605   TIBC 358 09/24/2018 0605   FERRITIN 139 09/24/2018 0605   IRONPCTSAT  40 (H) 09/24/2018 5284       ASSESSMENT/PLAN:       1.  Chronic kidney disease stage III with a baseline serum creatinine around 1.3-1.6.  This is likely on the basis of longstanding hypertension and atherosclerotic cardiovascular disease.  2.  Acute kidney injury.  This is likely on the basis of decreased renal perfusion in the setting of volume depletion.  Serum creatinine is stable around 2.3.  3.  Hypernatremia.  Likely secondary to poor p.o. intake.  Challenging situation as she is not tolerating IV fluids well due to underlying congestive heart failure.  Ultimately, she needs to be hydrated orally.  If the family desires would place a PEG tube.  Continue D5 W at current rate.  4.  Hypertension.  She originally presented with hypertensive urgency, which has somewhat improved.  Would plan regular IV hypertensives until the PEG tube is placed.  5.  Acute on chronic diastolic congestive heart  failure.  Recommend PEG tube placement as continuing IV fluids well be challenging.  Avoid sodium in IV fluids.    [place], DO, [place]

## 2018-10-05 NOTE — Progress Notes (Addendum)
NEUROLOGY PROGRESS NOTE  Subjective:   Patient in bed, NAD, on RA. Family at bedside. Patient does not respond to name calling. Grimaces to pain. Family helped to interpret commands. Patient able to open eyes to command (though not wide eye opening), did not follow any other commands.  Did not notice any twitching during neurological exam.  Exam: Vitals:   10/05/18 0353 10/05/18 0725  BP: (!) 175/61 (!) 178/69  Pulse: 72 77  Resp: (!) 22   Temp: 98.6 F (37 C) 98.6 F (37 C)  SpO2: 99%     Physical Exam  HEENT-  Normocephalic, no lesions, without obvious abnormality.  Normal external eye and conjunctiva.  Extremities- Warm, dry and intact Musculoskeletal-generalized edema Skin-warm and dry, no hyperpigmentation, vitiligo, or suspicious lesions  Neuro:  Mental Status: Patient is slightly more awake than on Friday.  Grimacing to pain. Not responding to name calling. Does not speak or understand [place]. Family translated for me.  Only opened eyes to command, did not follow any other commands. does not open eyes to verbal stimuli.  Cranial Nerves: II: Does not blink to threat; actively resisting eye opening III,IV, VI: Doll's eyes intact. Left pupil 7 mm ( family states this is baseline).  Minimally reactive to light, right pupil 3 mm and reactive to light. V,VII: Face symmetric, grimaces to pain Motor/ Sensory: Moves extremities in response to pain only,  Did not break gravity in any extremity. Plantars: Right: downgoing   Left: downgoing Cerebellar/Gait: Unable to assess    Medications:  Scheduled: . budesonide (PULMICORT) nebulizer solution  0.25 mg Nebulization BID  . chlorhexidine  15 mL Mouth Rinse BID  . cloNIDine  0.3 mg Transdermal Weekly  . ipratropium-albuterol  3 mL Nebulization BID  . levothyroxine  37.5 mcg Intravenous Daily  . LORazepam  2 mg Intravenous Once  . mouth rinse  15 mL Mouth Rinse q12n4p  . metoprolol tartrate  10 mg Intravenous Q6H    Pertinent  Labs/Diagnostics: Currently hooked up to LTM EEG awaiting final reading  CSF results for CLARITA, MCELVAIN (MRN [id number]) as of 10/02/2018 09:11  Ref. Range 10/01/2018 09:47  Appearance, CSF Latest Ref Range: CLEAR  CLEAR  Glucose, CSF Latest Ref Range: 40 - 70 mg/dL 73 (H)  RBC Count, CSF Latest Ref Range: 0 /cu mm 1 (H)  WBC, CSF Latest Ref Range: 0 - 5 /cu mm 1  Other Cells, CSF Unknown TOO FEW TO COUNT, SMEAR AVAILABLE FOR REVIEW  Color, CSF Latest Ref Range: COLORLESS  COLORLESS  Supernatant Unknown NOT INDICATED  Total  Protein, CSF Latest Ref Range: 15 - 45 mg/dL 23  Tube # Unknown 3   --CSF fungal culture negative -CSF bacterial culture shows no growth -Cryptococcal antigen negative -HSV 1 and 2 negative - CSF IgG 2.2   LTM EEG report for 10/02/2018: This was a moderately abnormal continuous video EEG due to generalized background slowing with triphasic waves, indicative of a toxic-metabolic encephalopathy pattern. No seizures or epileptiform discharges were seen.    CMP: 10/04/2018: NA: 150,  cl: 123, BG: 156, creatinine: 2.38 ca: 8.2 GFR:22   Laurey Morale, MSN, NP-C [facility] [phone]    Assessment: 78 year old female with encephalopathy in the context of pneumonia, hypothyroidism and worsened renal function.  1. The patient remains encephalopathic and obtunded to the point where she only responds to pain.   2. CSF did not show any leukocytosis or other initial findings suggestive of bacterial infection. HSV  1 and 2 PCR negative. CSF IgG normal. Bacterial and fungal cultures negative to date. Cryptococcal antigen negative.  3. Etiology for her AMS is most likely toxic/metabolic   4. EEG revealed triphasic waves, indicative of a toxic-metabolic encephalopathy. No seizures or epileptiform discharges seen. The EEG findings as well as clinical features are consistent with her twitching being secondary to encephalopathy rather than  seizure   Recommendations: - Continue supportive care -- Correct metabolic derangements -- May have PEG tube placed, with family meeting tomorrow to decide this.   Electronically signed: Dr. Kerney Elbe

## 2018-10-06 ENCOUNTER — Inpatient Hospital Stay (HOSPITAL_COMMUNITY): Payer: Medicare Other

## 2018-10-06 LAB — BASIC METABOLIC PANEL
Anion gap: 9 (ref 5–15)
BUN: 61 mg/dL — ABNORMAL HIGH (ref 8–23)
CO2: 18 mmol/L — ABNORMAL LOW (ref 22–32)
Calcium: 8 mg/dL — ABNORMAL LOW (ref 8.9–10.3)
Chloride: 121 mmol/L — ABNORMAL HIGH (ref 98–111)
Creatinine, Ser: 1.86 mg/dL — ABNORMAL HIGH (ref 0.44–1.00)
GFR calc Af Amer: 30 mL/min — ABNORMAL LOW (ref >60)
GFR calc non Af Amer: 25 mL/min — ABNORMAL LOW (ref >60)
Glucose, Bld: 127 mg/dL — ABNORMAL HIGH (ref 70–99)
Potassium: 4.1 mmol/L (ref 3.5–5.1)
SODIUM: 148 mmol/L — AB (ref 135–145)

## 2018-10-06 MED ORDER — HYDRALAZINE HCL 20 MG/ML IJ SOLN
10.0000 mg | Freq: Four times a day (QID) | INTRAMUSCULAR | Status: DC | PRN
  Administered 2018-10-08 – 2018-10-16 (×8): 10 mg via INTRAVENOUS
  Filled 2018-10-06 (×8): qty 1

## 2018-10-06 MED ORDER — GERHARDT'S BUTT CREAM
TOPICAL_CREAM | Freq: Two times a day (BID) | CUTANEOUS | Status: AC
  Administered 2018-10-07 – 2018-10-09 (×6): via TOPICAL
  Administered 2018-10-10: 1 via TOPICAL
  Administered 2018-10-10 – 2018-10-13 (×6): via TOPICAL
  Administered 2018-10-13: 1 via TOPICAL
  Filled 2018-10-06: qty 1

## 2018-10-06 MED ORDER — OSMOLITE 1.2 CAL PO LIQD
1000.0000 mL | ORAL | Status: DC
  Administered 2018-10-06 – 2018-10-16 (×6): 1000 mL
  Filled 2018-10-06 (×14): qty 1000

## 2018-10-06 MED ORDER — JEVITY 1.2 CAL PO LIQD: 1000.0000 mL | ORAL | Status: DC

## 2018-10-06 NOTE — Consult Note (Signed)
[facility] Nurse wound consult note Reason for Consult:Patient evaluated for blister to left heel. Bilateral gluteal folds now have developed intact serum filled blistering on each side within the gluteal fold.  Blistering present to labial folds as well.  Wound type:Moisture associated skin damage.  External Urinary management system is in place.  Therapeutic moisture wicking linen in place.  No disposable underpads while in bed. Turn and reposition every two hours.   Pressure Injury POA: NA Measurement: Left gluteal fold:  0.5 cm serum filled blister Right fold:  1 cm serum filled blister  Wound AST:MHDQQI blister Drainage (amount, consistency, odor) none Periwound:intact Dressing procedure/placement/frequency:Cleanse gluteal folds with soap and water and pat dry  Xeroform gauze has been applied to buttocks blisters and will keep in place until tomorrow.  Will implement a zinc based topical barrier cream with antifungal additive for buttocks and labia.  Will not follow at this time.  Please re-consult if needed.  Domenic Moras MSN, RN, FNP-BC [facility]ON Wound, Ostomy, Continence Nurse Pager [phone]

## 2018-10-06 NOTE — Progress Notes (Signed)
Nutrition Follow-up  INTERVENTION:  Once Cortrak NGT ready for use: Initiate Osmolite 1.2 formula at 25 ml/hr and advance by 10 ml every 8 hours to goal rate of 55 ml/hr.   Tube feeding regimen to provide 1584 kcal, 73 grams of protein, and 1082 ml water.   Monitor magnesium, potassium, and phosphorus daily for at least 3 days, MD to replete as needed, as pt is at risk for refeeding syndrome given prolonged poor po intake/NPO for 1 week.  NUTRITION DIAGNOSIS:   Inadequate oral intake related to inability to eat as evidenced by NPO status; ongoing  GOAL:   Patient will meet greater than or equal to 90% of their needs; progressing  MONITOR:   Weight trends, Labs, I & O's, Other (Comment)(plan for route of nutrition (oral vs enteral))  REASON FOR ASSESSMENT:   Consult Assessment of nutrition requirement/status, Enteral/tube feeding initiation and management  ASSESSMENT:   78 y.o. female with history of CHF with grade 1 diastolic dysfunction, chronic hyponatremia, CKD stage III, chronic anemia, hypothyroidism, and HTN. She presented to the ED with complaints of fatigue and SOB for the past 1 week. Patient also noticed increasing swelling in the lower extremities. Has had some productive cough.  Patient also has been feeling weak and fatigued easily.  Per family, pt more alert today. SLP following and per swallow evaluation it with severe aspiration risk. Pt continues NPO status. Family requests to pursue Cortrak NGT for enteral nutrition, however do not want a PEG tube. Cortrak NGT has been placed and currently awaiting abdominal x-ray to confirm tube tip placement. Once NGT ready for use, recommend initiating tube feeding at low and slow rate as pt at risk for refeeding syndrome given prolonged poor po/NPO status for 1 week. RD to continue to monitor.   Labs and medications reviewed. Sodium elevated at 148.   Diet Order:   Diet Order            Diet NPO time specified  Diet  effective now              EDUCATION NEEDS:   Not appropriate for education at this time  Skin:  Skin Assessment: Reviewed RN Assessment  Last BM:  2/1  Height:   Ht Readings from Last 1 Encounters:  09/23/18 4\' 11"  (1.499 m)    Weight:   Wt Readings from Last 1 Encounters:  10/01/18 52.9 kg    Ideal Body Weight:  42.91 kg  BMI:  Body mass index is 23.56 kg/m.  Estimated Nutritional Needs:   Kcal:  1560-1820 kcal  Protein:  65-75 grams  Fluid:  >/= 1.6 L/day    Corrin Parker, MS, RD, LDN Pager # [phone] After hours/ weekend pager # [phone]

## 2018-10-06 NOTE — Progress Notes (Addendum)
PROGRESS NOTE    Katie Mosley  ZYS:[id number] DOB: [birth year] DOA: 09/23/2018 PCP: Katie Ebbs, MD   Brief Narrative:  As previously noted:Katie B Sheikhunais a 78 y.o.femalewithhistory of diastolic CHF last EF measured in March 2017 was 60 to 65% with grade 1 diastolic dysfunction, chronic hyponatremia, chronic kidney disease stage III, chronic anemia, hypothyroidism hypertension presents to the ER with complaints of having fatigue and shortness of breath for the last 1 week. Patient also noticed increasing swelling in the lower extremities. Has had some productive cough. Patient also has been feeling weak and fatigued easily. Has not had any dizziness fall or loss of consciousness.Patient was admitted to [facility] long. Work-up is been negative for encephalopathic process. She was subsequently transferred to [facility] for further evaluation as noted below.  ED Course:In the ER patient EKG shows sinus bradycardia with a heart rate around 54 bpm. Nonspecific ST-T changes. Labs reveal sodium of 123 which is markedly low from her previous. On exam patient has bilateral crepitations with lower extremity edema. Chest x-ray shows peripheral congestion. Patient's hemoglobin is at baseline. But does have some leukopenia. Patient is afebrile. Initially patient was given fluids in the ER by ER physician but given that patient has been likely CHF, now havediscontinued the fluids and start patient on Lasix.  09/27/2018: Patient transferred to [facility].patient's mental status continues to wax and wane. Patient is currently awake but nonverbal, confused, unable to also questions. Family's is always by the bedside. All questions and concerns have been addressed. Patient continues to decline despite extensive testing and interventions. Prognosis remains guarded.Neurology consulted as last resort ,recommed LTM EEG, and LP, transfer to [facility] per neuro recommendations.  This work-up is been  completely negative    Assessment & Plan:   Principal Problem:   Sepsis ([place]) Active Problems:   Hyponatremia   Hypothyroidism   Hypothermia   Acute encephalopathy   Normocytic anemia   Leukopenia   CKD (chronic kidney disease), stage IV ([place])   DNR (do not resuscitate) discussion   Acute respiratory failure ([place])   Addendum 9323: Patient much more awake and alert.  Met with family.  Want to pursue cortrak, did not want a PEG tube, no indication for PICC line-cancel, as speech to re-eval bedside swallow.  Acute encephalopathy, probably combined toxic and metabolic encephalopathy. Patient remains encephalopathic and tenuous with altered mental status, there is been minimal change although son reports "she is improving". Etiology is thought to be multifocal initially to include underlying infection complicated hyponatremia and adult failure to thrive. She was initially seen by speech at [facility] long with recommendations for nectar thick liquids but patient now is not interacting and is unable to take p.o. medications. Had extensive family discussion with g-daughter who is a physician in [place] as well as family members at bedside. Discussed patient's work-up which to date has shown negative respiratory virus panel CT and MRI without acute findings although atrophy identified, EEG showing encephalopathic process, LP without evidence of infection with clear fluid and 1 white blood cell as well as negative Gram stain andculture.Discussed plan of care patient family is undecided at this point time and wanted proceed with providing care until family meeting on Monday at 1 PM.  We will discuss bolus of care to include palliative and hospice versus PEG tube, PICC line, and continued aggressive care.  .Pneumonia suspect gram-negative organism. As previously noted chest x-ray from 1/29 showed right-sided haziness consistent with infiltrate patient received meropenem then cefepime. She completed  a  5-day course with a negative procalcitonin negative respiratory panel normal white blood cell count afebrile. No evidence of infection and  Hypothyroidcontinuing IV Synthroid at this point time  Hypernatremiamildly improved with hydration D5 quarter normal down to 148. Patient serum osmole's were 347 prior to hydration. Unfortunately because of patient's CHF with diastolic dysfunction chest x-ray showing edema will have to discontinue fluids today,1 dose of Lasix was given 2/9  Chronic dysphasia.Suspect is underlying element of aspiration. Patient was seen by speech as noted and they recommended nectar thick liquids unfortunately patient unable to tolerate any p.o. intake now discussed with the family PEG tube they want to wait till Monday to make a decision.  Acute on chronic kidney disease.Patient reported creatinine baseline is 1.3-1.6. On transfer from was a long was noted to creatinine been climbing from 2.2-2.7. A plateaued at 2.7 and has declined slightly with hydration to 1.86 2/10. Treatment complicated by patient's CHF. I discussed the case with nephrology now the family wants to pursue more aggressive care they will see in consultation today.  Hypertensive urgency. Patient had an episode of hypertensive urgency pre-transfer. Currently  we are using PRN antihypertensives  DVT prophylaxis: SCD/Compression stockings  Code Status: Full    Code Status Orders  (From admission, onward)         Start     Ordered   09/26/18 0946  Full code  Continuous     09/26/18 0947        Code Status History    Date Active Date Inactive Code Status Order ID Comments User Context   09/24/2018 1001 09/26/2018 0947 DNR [id number]  Roney Jaffe, MD ED   09/24/2018 0423 09/24/2018 1001 Full Code [id number]  Rise Patience, MD ED   10/29/2015 0046 11/05/2015 1331 Full Code [id number]  Eugenie Filler, MD Inpatient   08/08/2015 0432 08/13/2015 1739 Full Code [id number]  Edwin Dada, MD Inpatient   02/13/2015 0552 02/18/2015 1505 Full Code [id number]  Ivor Costa, MD Inpatient   10/31/2014 1332 11/03/2014 1820 Full Code [id number]  Orson Eva, MD Inpatient     Family Communication: son, g-daughter, other fam members  Disposition Plan:   unclear at this time Consults called: None Admission status: Inpatient   Consultants:   neuro   nephrology  Procedures:  Dg Chest 2 View  Result Date: 09/24/2018 CLINICAL DATA:  Shortness of breath. EXAM: CHEST - 2 VIEW COMPARISON:  06/22/2018 and 10/16/2017 FINDINGS: There is new pulmonary vascular congestion. There is new peripheral haziness in the right lung cyst pulmonary edema. No discrete effusions. Heart is within normal limits of size considering the AP portable technique. Aortic atherosclerosis.  No acute bone abnormalities. IMPRESSION: New pulmonary vascular congestion with slight peripheral pulmonary edema on the right. Electronically Signed   By: Lorriane Shire M.D.   On: 09/24/2018 04:12   Dg Abd 1 View  Result Date: 09/28/2018 CLINICAL DATA:  Ileus EXAM: ABDOMEN - 1 VIEW COMPARISON:  09/27/2018 FINDINGS: Nonobstructive bowel gas pattern. Mild residual contrast within ascending and transverse colon. No colonic dilatation. Degenerative changes of the lumbar spine. IMPRESSION: Unremarkable abdominal radiograph. Electronically Signed   By: Julian Hy M.D.   On: 09/28/2018 05:08   Dg Abd 1 View  Result Date: 09/27/2018 CLINICAL DATA:  Encounter for feeding tube placement. EXAM: ABDOMEN - 1 VIEW COMPARISON:  Radiograph of same day.  CT scan of June 22, 2018. FINDINGS: No small bowel dilatation is noted. Residual contrast is seen in  the colon. Colonic dilatation is noted suggesting possible ileus. No abnormal calcifications are noted. IMPRESSION: Colonic dilatation is noted in left side of abdomen concerning for possible ileus. Electronically Signed   By: Marijo Conception, M.D.   On: 09/27/2018 12:21   Ct Head Wo  Contrast  Result Date: 09/27/2018 CLINICAL DATA:  Neurological deficits with involuntary movements. EXAM: CT HEAD WITHOUT CONTRAST TECHNIQUE: Contiguous axial images were obtained from the base of the skull through the vertex without intravenous contrast. COMPARISON:  09/24/2018 FINDINGS: Brain: Diffuse cerebral atrophy. Ventricular dilatation consistent with central atrophy. Low-attenuation changes in the deep white matter consistent with small vessel ischemia. Old parenchymal calcifications in the basal ganglia and left cerebellum without change. No mass effect or midline shift. No abnormal extra-axial fluid collections. Gray-white matter junctions are distinct. Basal cisterns are not effaced. No acute intracranial hemorrhage. Vascular: Prominent intracranial arterial vascular calcifications are present. Skull: Calvarium appears intact. No acute displaced fractures identified. Sinuses/Orbits: Paranasal sinuses and mastoid air cells are clear. Other: No significant change since prior study. IMPRESSION: No acute intracranial abnormalities. Chronic atrophy and small vessel ischemic changes. Electronically Signed   By: Lucienne Capers M.D.   On: 09/27/2018 03:56   Ct Head Wo Contrast  Result Date: 09/24/2018 CLINICAL DATA:  78 y/o  F; 3 days of weakness. EXAM: CT HEAD WITHOUT CONTRAST TECHNIQUE: Contiguous axial images were obtained from the base of the skull through the vertex without intravenous contrast. COMPARISON:  10/16/2017 CT head. FINDINGS: Brain: No evidence of acute infarction, hemorrhage, hydrocephalus, extra-axial collection or mass lesion/mass effect. Stable dystrophic calcifications in the basal ganglia and calcifications in the left cerebellar hemisphere. 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 Stable chronic microvascular ischemic changes and volume loss of the brain. Vascular: Calcific atherosclerosis of carotid siphons. No hyperdense vessel identified. Skull: Normal.  Negative for fracture or focal lesion. Sinuses/Orbits: No acute finding. Other: Bilateral intra-ocular lens replacement. IMPRESSION: 1. No acute intracranial abnormality identified. 2. Stable chronic microvascular ischemic changes and volume loss of the brain. Stable small chronic lacunar infarct in the right caudate nucleus. Electronically Signed   By: Kristine Garbe M.D.   On: 09/24/2018 05:15   Mr Brain Wo Contrast  Result Date: 09/29/2018 CLINICAL DATA:  Acute encephalopathy. Jerking movements. Progressive weakness. EXAM: MRI HEAD WITHOUT CONTRAST TECHNIQUE: Multiplanar, multiecho pulse sequences of the brain and surrounding structures were obtained without intravenous contrast. COMPARISON:  Head CT 09/27/2018 and MRI 08/08/2015 FINDINGS: Brain: There is no evidence of acute infarct, intracranial hemorrhage, mass, midline shift, or extra-axial fluid collection. Patchy to confluent T2 hyperintensities in the cerebral white matter are slightly progressed from the prior MRI and are nonspecific but compatible with moderate chronic small vessel ischemic disease. Mild T2 hyperintensity in the basal ganglia and thalami is similar to the prior MRI. 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 There is a small remote insult in the left cerebellum with associated calcification on CT. Vascular: Major intracranial vascular flow voids are preserved. Skull and upper cervical spine: Unremarkable bone marrow signal. Sinuses/Orbits: Bilateral cataract extraction. Clear paranasal sinuses. Trace mastoid effusions. Other: None. IMPRESSION: 1. No acute intracranial abnormality. 2. Moderate chronic small vessel ischemic disease and cerebral atrophy. Electronically Signed   By: Logan Bores M.D.   On: 09/29/2018 13:22   Dg Chest Port 1 View  Result Date: 10/04/2018 CLINICAL DATA:  Shortness of Breath EXAM: PORTABLE CHEST 1 VIEW COMPARISON:  February  4, 13-Nov-2018 FINDINGS: There  are pleural effusions bilaterally. There appears to be loculated pleural effusion on the left, a change from previous study. There is cardiomegaly with pulmonary venous hypertension. There is a degree of underlying interstitial edema. There is aortic atherosclerosis. No bone lesions evident. IMPRESSION: Pulmonary vascular congestion with interstitial edema and pleural effusions. There is apparent loculated effusion on the left. A degree of superimposed airspace consolidation in this area can not be excluded. The overall appearance is felt to be most consistent with congestive heart failure. There is aortic atherosclerosis. Electronically Signed   By: Lowella Grip III M.D.   On: 10/04/2018 10:29   Dg Chest Port 1 View  Result Date: 09/30/2018 CLINICAL DATA:  Fever. EXAM: PORTABLE CHEST 1 VIEW COMPARISON:  Chest x-ray 09/27/2018. FINDINGS: Cardiomegaly with bilateral interstitial prominence. Improved aeration from prior exam. Persistent right lower lobe alveolar infiltrate. This could represent asymmetric pulmonary edema and/or pneumonia. Small bilateral pleural effusions. IMPRESSION: 1. Cardiomegaly with bilateral interstitial prominence, improved aeration from prior exam. Findings suggesting improving CHF. Small bilateral pleural effusions. 2. Persistent right base infiltrate. This could represent asymmetric pulmonary edema and/or pneumonia. Similar findings on prior exam. Electronically Signed   By: [place]   On: 09/30/2018 13:34   Dg Chest Port 1 View  Result Date: 09/27/2018 CLINICAL DATA:  Acute respiratory failure. EXAM: PORTABLE CHEST 1 VIEW COMPARISON:  Chest x-ray dated September 25, 2018. FINDINGS: The patient is rotated to the left, limiting evaluation. Stable cardiomegaly. Unchanged mild interstitial edema, small bilateral pleural effusions, and bibasilar atelectasis. Apparent increased density at the right lung base is likely due to rotation. No pneumothorax. No acute osseous  abnormality. IMPRESSION: 1. Unchanged mild interstitial edema, small bilateral pleural effusions, and bibasilar atelectasis. Electronically Signed   By: Titus Dubin M.D.   On: 09/27/2018 10:33   Dg Chest Port 1 View  Result Date: 09/25/2018 CLINICAL DATA:  Follow-up swallow study. EXAM: PORTABLE CHEST 1 VIEW COMPARISON:  Radiographs of September 24, 2018. FINDINGS: Stable cardiomegaly is noted. Atherosclerosis of thoracic aorta is noted. No pneumothorax is noted. Minimal bibasilar subsegmental atelectasis is noted with minimal pleural effusions. Bony thorax is unremarkable. Triangular area of increased density is seen in the region of the upper thoracic spine; is uncertain if this represents retained contrast from prior swallowing study. Rounded barium tablet is not clearly identified. IMPRESSION: Minimal bibasilar subsegmental atelectasis is noted with minimal pleural effusions. Triangular area of increased density is seen projected over upper thoracic spine which potentially may represent residual contrast from prior swallowing study. Aortic Atherosclerosis (ICD10-I70.0). Electronically Signed   By: Marijo Conception, M.D.   On: 09/25/2018 13:18   Dg Swallowing Func-speech Pathology  Result Date: 09/25/2018 Objective Swallowing Evaluation: Type of Study: MBS-Modified Barium Swallow Study  Patient Details Name: SHANON SEAWRIGHT MRN: [id number] Date of Birth: Oct 09, 1940 Today's Date: 09/25/2018 Time: SLP Start Time (ACUTE ONLY): 0750 -SLP Stop Time (ACUTE ONLY): 0804 SLP Time Calculation (min) (ACUTE ONLY): 14 min Past Medical History: Past Medical History: Diagnosis Date . Adrenal insufficiency ([place])  . Adrenal insufficiency ([place])   Dx'd [facility] 05/2015 . Asthma  . CHF (congestive heart failure) ([place])  . GERD (gastroesophageal reflux disease)  . Glaucoma  . Headache  . HTN (hypertension)  . Hypertension  . Hypothyroidism  . Normocytic anemia 09/07/2018 . Shortness of breath dyspnea  . Thyroid disease   not know  in detail Past Surgical History: Past Surgical History: Procedure Laterality Date . APPENDECTOMY   .  BACK SURGERY   . CAPSULOTOMY  08/23/2011  Procedure: MINOR CAPSULOTOMY;  Surgeon: Myrtha Mantis.;  Location: [place];  Service: Ophthalmology;  Laterality: Left;  YAG Capsulotomy Left Eye . ESOPHAGOGASTRODUODENOSCOPY N/A 03/17/2013  Procedure: ESOPHAGOGASTRODUODENOSCOPY (EGD);  Surgeon: Irene Shipper, MD;  Location: [facility] ENDOSCOPY;  Service: Endoscopy;  Laterality: N/A; . LEFT CATARACT EXTRACTION    2007 . RIGHT CATARACT EXTRACTION    2006 HPI: pt is a 78 yo female adm to [facility] with AMS x3 days, CT head + basal ganglia and left cerebellar calcification, 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  Pt with h/o CHF, renal dysfunction. Swallow evauluation ordered as pt had difficulty swallowing a pill this am.  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   Subjective: pt awake in chair Assessment / Plan / Recommendation CHL IP CLINICAL IMPRESSIONS 09/25/2018 Clinical Impression Pt's swallow function was inconsistent throughout MBS - neck extension posture significantly worsened pharyngeal contraction and thus resulted in gross residuals of puree/thin. Oral deficits significant mostly with liquids resulting in liquids spilling into pharynx not controlled and resulting in aspiration before the swallow with nectar.  Aspiration after the swallow from pyriform sinus spillage did elicit a delayed cough but not adequately strong to clear pharynx.  Chin tuck posture improved airway protection significantly and prevented aspiration as well as decreased pharyngeal residuals.  Cued dry swallows helpful to clear oral residuals.  Of note, pt appeared with significant stasis in proximal esophagus without sensation *just below UES without awareness.  Pudding consistency with tablet did not clear this area despite follow up liquid swallows.  Dependent on family's goal for pt,  consideration for GI referral recommended given son's report of chronicity of dysphagia for "a long time".  Recommend to start full liquid diet *nectar* and allow thin water between meals. Use straw to faciliate chin down posture for pt.  Son present and wishes to interpret *not ipad interpreter* therefore allowed in xray.  SLP will follow up for pt tolerance, family education.   SLP Visit Diagnosis Dysphagia, oropharyngeal phase (R13.12);Dysphagia, pharyngoesophageal phase (R13.14) Attention and concentration deficit following -- Frontal lobe and executive function deficit following -- Impact on safety and function Moderate aspiration risk;Risk for inadequate nutrition/hydration   CHL IP TREATMENT RECOMMENDATION 09/25/2018 Treatment Recommendations Therapy as outlined in treatment plan below   Prognosis 09/25/2018 Prognosis for Safe Diet Advancement Fair Barriers to Reach Goals Time post onset Barriers/Prognosis Comment -- CHL IP DIET RECOMMENDATION 09/25/2018 SLP Diet Recommendations Nectar thick liquid Liquid Administration via -- Medication Administration Crushed with puree Compensations Slow rate;Small sips/bites Postural Changes --   CHL IP OTHER RECOMMENDATIONS 09/25/2018 Recommended Consults Consider GI evaluation Oral Care Recommendations Oral care before and after PO Other Recommendations Order thickener from pharmacy;Have oral suction available   CHL IP FOLLOW UP RECOMMENDATIONS 09/25/2018 Follow up Recommendations (No Data)   CHL IP FREQUENCY AND DURATION 09/25/2018 Speech Therapy Frequency (ACUTE ONLY) min 2x/week Treatment Duration 1 week      CHL IP ORAL PHASE 09/25/2018 Oral Phase Impaired Oral - Pudding Teaspoon -- Oral - Pudding Cup -- Oral - Honey Teaspoon -- Oral - Honey Cup -- Oral - Nectar Teaspoon Weak lingual manipulation;Decreased bolus cohesion;Reduced posterior propulsion;Premature spillage Oral - Nectar Cup -- Oral - Nectar Straw Weak lingual manipulation;Decreased bolus cohesion;Reduced  posterior propulsion;Premature spillage;Delayed oral transit Oral - Thin Teaspoon Decreased bolus cohesion;Weak lingual manipulation;Reduced posterior propulsion;Premature spillage;Lingual/palatal residue;Delayed oral transit Oral - Thin  Cup -- Oral - Thin Straw Reduced posterior propulsion;Weak lingual manipulation;Decreased bolus cohesion;Premature spillage;Lingual/palatal residue;Delayed oral transit Oral - Puree Decreased bolus cohesion;Weak lingual manipulation;Lingual pumping;Premature spillage;Lingual/palatal residue;Delayed oral transit Oral - Mech Soft Decreased bolus cohesion;Premature spillage;Delayed oral transit Oral - Regular -- Oral - Multi-Consistency -- Oral - Pill Decreased bolus cohesion;Premature spillage;Weak lingual manipulation;Lingual pumping;Delayed oral transit Oral Phase - Comment --  CHL IP PHARYNGEAL PHASE 09/25/2018 Pharyngeal Phase Impaired Pharyngeal- Pudding Teaspoon -- Pharyngeal -- Pharyngeal- Pudding Cup -- Pharyngeal -- Pharyngeal- Honey Teaspoon -- Pharyngeal -- Pharyngeal- Honey Cup -- Pharyngeal -- Pharyngeal- Nectar Teaspoon -- Pharyngeal -- Pharyngeal- Nectar Cup -- Pharyngeal -- Pharyngeal- Nectar Straw WFL Pharyngeal -- Pharyngeal- Thin Teaspoon Penetration/Aspiration before swallow Pharyngeal Material enters airway, passes BELOW cords without attempt by patient to eject out (silent aspiration) Pharyngeal- Thin Cup -- Pharyngeal -- Pharyngeal- Thin Straw Penetration/Apiration after swallow;Pharyngeal residue - valleculae;Pharyngeal residue - pyriform Pharyngeal Material enters airway, passes BELOW cords without attempt by patient to eject out (silent aspiration);Material enters airway, passes BELOW cords and not ejected out despite cough attempt by patient Pharyngeal- Puree Reduced tongue base retraction;Reduced airway/laryngeal closure;Reduced laryngeal elevation;Pharyngeal residue - valleculae;Pharyngeal residue - pyriform;Reduced pharyngeal peristalsis Pharyngeal --  Pharyngeal- Mechanical Soft Reduced tongue base retraction;Pharyngeal residue - valleculae Pharyngeal -- Pharyngeal- Regular -- Pharyngeal -- Pharyngeal- Multi-consistency -- Pharyngeal -- Pharyngeal- Pill Pharyngeal residue - valleculae Pharyngeal -- Pharyngeal Comment pt with gross residuals in pharynx intermittently - most notably with head extended upward with swallow, dry swallow attempts are essentially not initiated as very minimal movement observed, aspiration of thin occured after the swallow as pyriform sinus residual spill into open airway - posterior trach,   CHL IP CERVICAL ESOPHAGEAL PHASE 09/25/2018 Cervical Esophageal Phase Impaired Pudding Teaspoon -- Pudding Cup -- Honey Teaspoon -- Honey Cup -- Nectar Teaspoon -- Nectar Cup -- Nectar Straw -- Thin Teaspoon -- Thin Cup -- Thin Straw -- Puree -- Mechanical Soft -- Regular -- Multi-consistency -- Pill -- Cervical Esophageal Comment pt appeared with significant residuals below UES WITHOUT awareness, much more prominent with puree than liquids Macario Golds 09/25/2018, 10:45 AM Luanna Salk, MS [facility] SLP Acute Rehab Services Pager [phone] Office [phone]              Vas [place] Lower Extremity Venous (dvt)  Result Date: 09/24/2018  Lower Venous Study Indications: Edema.  Performing Technologist: Oliver Hum RVT  Examination Guidelines: A complete evaluation includes B-mode imaging, spectral Doppler, color Doppler, and power Doppler as needed of all accessible portions of each vessel. Bilateral testing is considered an integral part of a complete examination. Limited examinations for reoccurring indications may be performed as noted.  Right Venous Findings: +---------+---------------+---------+-----------+----------+-------+          CompressibilityPhasicitySpontaneityPropertiesSummary +---------+---------------+---------+-----------+----------+-------+ CFV      Full           Yes      Yes                           +---------+---------------+---------+-----------+----------+-------+ SFJ      Full                                                 +---------+---------------+---------+-----------+----------+-------+ FV Prox  Full                                                 +---------+---------------+---------+-----------+----------+-------+  FV Mid   Full                                                 +---------+---------------+---------+-----------+----------+-------+ FV DistalFull                                                 +---------+---------------+---------+-----------+----------+-------+ PFV      Full                                                 +---------+---------------+---------+-----------+----------+-------+ POP      Full           Yes      Yes                          +---------+---------------+---------+-----------+----------+-------+ PTV      Full                                                 +---------+---------------+---------+-----------+----------+-------+ PERO     Full                                                 +---------+---------------+---------+-----------+----------+-------+  Left Venous Findings: +---------+---------------+---------+-----------+----------+-------+          CompressibilityPhasicitySpontaneityPropertiesSummary +---------+---------------+---------+-----------+----------+-------+ CFV      Full           Yes      Yes                          +---------+---------------+---------+-----------+----------+-------+ SFJ      Full                                                 +---------+---------------+---------+-----------+----------+-------+ FV Prox  Full                                                 +---------+---------------+---------+-----------+----------+-------+ FV Mid   Full                                                  +---------+---------------+---------+-----------+----------+-------+ FV DistalFull                                                 +---------+---------------+---------+-----------+----------+-------+  PFV      Full                                                 +---------+---------------+---------+-----------+----------+-------+ POP      Full           Yes      Yes                          +---------+---------------+---------+-----------+----------+-------+ PTV      Full                                                 +---------+---------------+---------+-----------+----------+-------+ PERO     Full                                                 +---------+---------------+---------+-----------+----------+-------+    Summary: Right: There is no evidence of deep vein thrombosis in the lower extremity. No cystic structure found in the popliteal fossa. Left: There is no evidence of deep vein thrombosis in the lower extremity. No cystic structure found in the popliteal fossa.  *See table(s) above for measurements and observations. Electronically signed by Monica Martinez MD on 09/24/2018 at 3:52:06 PM.    Final      Antimicrobials:   none    Subjective: Still remains obtunded with minimal interaction.  Objective: Vitals:   10/05/18 2325 10/06/18 0317 10/06/18 0727 10/06/18 0739  BP: (!) 145/59 (!) 170/62  (!) 191/68  Pulse: 72 72    Resp:   16 20  Temp: (!) 97.4 F (36.3 C) 98 F (36.7 C)  98.9 F (37.2 C)  TempSrc: Axillary Axillary  Axillary  SpO2:   100% 95%  Weight:      Height:        Intake/Output Summary (Last 24 hours) at 10/06/2018 1141 Last data filed at 10/06/2018 5726 Gross per 24 hour  Intake 1505 ml  Output 200 ml  Net 1305 ml   Filed Weights   09/27/18 0500 09/28/18 0500 10/01/18 0500  Weight: 52.4 kg 52 kg 52.9 kg    Examination:  General exam: Appears calm, non-interactive Respiratory system: Clear to auscultation. Respiratory  effort normal. Cardiovascular system: S1 & S2 heard, RRR. No JVD, murmurs, rubs, gallops or clicks. No pedal edema. Gastrointestinal system: Abdomen is nondistended, soft and nontender. No organomegaly or masses felt. Normal bowel sounds heard. Central nervous system: obtunded, minimal interaction. Extremities: Symmetric 5 x 5 power.no edema Skin: No rashes, lesions or ulcers Psychiatry: unable to accuratelt assess 2/2 mental status.     Data Reviewed: I have personally reviewed following labs and imaging studies  CBC: Recent Labs  Lab 10/02/18 0815 10/04/18 1015 10/05/18 0627  WBC 4.2 4.3 4.4  NEUTROABS 2.8 2.8 3.0  HGB 8.4* 8.9* 9.1*  HCT 26.6* 28.5* 29.0*  MCV 95.3 96.9 95.1  PLT 205 184 203   Basic Metabolic Panel: Recent Labs  Lab 10/03/18 0804 10/03/18 1619 10/04/18 1015 10/05/18 0627 10/06/18 0314  NA 154* 154* 150* 149* 148*  K 3.3* 3.4* 3.8  4.1 4.1  CL 126* 126* 123* 123* 121*  CO2 19* 20* 17* 18* 18*  GLUCOSE 184* 155* 156* 111* 127*  BUN 75* 74* 69* 66* 61*  CREATININE 2.62* 2.55* 2.38* 2.34* 1.86*  CALCIUM 8.0* 8.0* 8.2* 8.2* 8.0*  MG  --   --   --  2.0  --    GFR: Estimated Creatinine Clearance: 18.5 mL/min (A) (by C-G formula based on SCr of 1.86 mg/dL (H)). Liver Function Tests: Recent Labs  Lab 09/30/18 0258 10/01/18 0948 10/04/18 1015  AST 45* 43* 31  ALT 25 24 17   ALKPHOS 59 57 53  BILITOT 1.4* 0.9 0.9  PROT 6.1* 5.9* 5.6*  ALBUMIN 2.4* 2.1* 1.9*   No results for input(s): LIPASE, AMYLASE in the last 168 hours. No results for input(s): AMMONIA in the last 168 hours. Coagulation Profile: Recent Labs  Lab 10/03/18 1215  INR 1.13   Cardiac Enzymes: No results for input(s): CKTOTAL, CKMB, CKMBINDEX, TROPONINI in the last 168 hours. BNP (last 3 results) No results for input(s): PROBNP in the last 8760 hours. HbA1C: No results for input(s): HGBA1C in the last 72 hours. CBG: No results for input(s): GLUCAP in the last 168  hours. Lipid Profile: No results for input(s): CHOL, HDL, LDLCALC, TRIG, CHOLHDL, LDLDIRECT in the last 72 hours. Thyroid Function Tests: No results for input(s): TSH, T4TOTAL, FREET4, T3FREE, THYROIDAB in the last 72 hours. Anemia Panel: No results for input(s): VITAMINB12, FOLATE, FERRITIN, TIBC, IRON, RETICCTPCT in the last 72 hours. Sepsis Labs: No results for input(s): PROCALCITON, LATICACIDVEN in the last 168 hours.  Recent Results (from the past 240 hour(s))  MRSA PCR Screening     Status: None   Collection Time: 09/27/18  9:44 AM  Result Value Ref Range Status   MRSA by PCR NEGATIVE NEGATIVE Final    Comment:        The GeneXpert MRSA Assay (FDA approved for NASAL specimens only), is one component of a comprehensive MRSA colonization surveillance program. It is not intended to diagnose MRSA infection nor to guide or monitor treatment for MRSA infections. Performed at [facility], [place] [address] [place], [place] [address]   Respiratory Panel by PCR     Status: None   Collection Time: 09/29/18  2:22 PM  Result Value Ref Range Status   Adenovirus NOT DETECTED NOT DETECTED Final   Coronavirus 229E NOT DETECTED NOT DETECTED Final    Comment: (NOTE) The Coronavirus on the Respiratory Panel, DOES NOT test for the novel  Coronavirus (2019 nCoV)    Coronavirus HKU1 NOT DETECTED NOT DETECTED Final   Coronavirus NL63 NOT DETECTED NOT DETECTED Final   Coronavirus OC43 NOT DETECTED NOT DETECTED Final   Metapneumovirus NOT DETECTED NOT DETECTED Final   Rhinovirus / Enterovirus NOT DETECTED NOT DETECTED Final   Influenza A NOT DETECTED NOT DETECTED Final   Influenza B NOT DETECTED NOT DETECTED Final   Parainfluenza Virus 1 NOT DETECTED NOT DETECTED Final   Parainfluenza Virus 2 NOT DETECTED NOT DETECTED Final   Parainfluenza Virus 3 NOT DETECTED NOT DETECTED Final   Parainfluenza Virus 4 NOT DETECTED NOT DETECTED Final   Respiratory Syncytial Virus NOT  DETECTED NOT DETECTED Final   Bordetella pertussis NOT DETECTED NOT DETECTED Final   Chlamydophila pneumoniae NOT DETECTED NOT DETECTED Final   Mycoplasma pneumoniae NOT DETECTED NOT DETECTED Final    Comment: Performed at [facility], [place]. [address], [place], [place] [address]  Culture, blood (  Routine X 2) w Reflex to ID Panel     Status: None   Collection Time: 09/30/18 11:36 AM  Result Value Ref Range Status   Specimen Description   Final    BLOOD LEFT HAND Performed at [place] [address], [place], [place] [address]    Special Requests   Final    BOTTLES DRAWN AEROBIC AND ANAEROBIC Blood Culture adequate volume Performed at [place] [address] [place], [place] [address]    Culture   Final    NO GROWTH 5 DAYS Performed at [facility], [place] [address], [place], [place] [address]    Report Status 10/05/2018 FINAL  Final  Culture, blood (Routine X 2) w Reflex to ID Panel     Status: None   Collection Time: 09/30/18 11:37 AM  Result Value Ref Range Status   Specimen Description   Final    BLOOD RIGHT HAND Performed at [place] [address], [place], [place] [address]    Special Requests   Final    BOTTLES DRAWN AEROBIC AND ANAEROBIC Blood Culture adequate volume Performed at [facility] [place] [address], [place], [place] [address]    Culture   Final    NO GROWTH 5 DAYS Performed at [facility], [place] [address] [place], [place] [address]    Report Status 10/05/2018 FINAL  Final  Urine Culture     Status: None   Collection Time: 09/30/18 12:38 PM  Result Value Ref Range Status   Specimen Description   Final    URINE, RANDOM Performed at [place] [address] [place], [place] [address]    Special Requests   Final    NONE Performed at [facility], [place] [address], [place], [place] [address]    Culture   Final     NO GROWTH Performed at [facility], [place] [address] [place], [place] [address]    Report Status 10/01/2018 FINAL  Final  CSF culture with Stat gram stain     Status: None   Collection Time: 10/01/18  9:55 AM  Result Value Ref Range Status   Specimen Description CSF  Final   Special Requests Normal  Final   Gram Stain   Final    CYTOSPIN SMEAR WBC PRESENT, PREDOMINANTLY MONONUCLEAR NO ORGANISMS SEEN    Culture   Final    NO GROWTH 3 DAYS Performed at [facility], [place] [address], [place], [place] [address]    Report Status 10/04/2018 FINAL  Final  Fungus Culture With Stain     Status: None (Preliminary result)   Collection Time: 10/01/18  9:55 AM  Result Value Ref Range Status   Fungus Stain Final report  Final    Comment: (NOTE) Performed At: [facility] [place], [place] [id number] Rush Farmer MD [id number]    Fungus (Mycology) Culture PENDING  Incomplete   Fungal Source CSF  Final    Comment: Performed at [facility], [place] [address] [place],  [address]  Fungus Culture Result     Status: None   Collection Time: 10/01/18  9:55 AM  Result Value Ref Range Status   Result 1 Comment  Final    Comment: (NOTE) KOH/Calcofluor preparation:  no fungus observed. Performed At: [facility] [place], [place] [id number] Rush Farmer MD [id number]  Radiology Studies: No results found.      Scheduled Meds: . budesonide (PULMICORT) nebulizer solution  0.25 mg Nebulization BID  . chlorhexidine  15 mL Mouth Rinse BID  . cloNIDine  0.3 mg Transdermal Weekly  . [START ON 10/07/2018] [name] butt cream   Topical BID  . ipratropium-albuterol  3 mL Nebulization BID  . levothyroxine  37.5 mcg Intravenous Daily  . LORazepam  2 mg Intravenous Once  . mouth rinse  15 mL Mouth Rinse q12n4p  . metoprolol tartrate  10 mg Intravenous Q6H   Continuous Infusions: . dextrose 5 % with KCl  20 mEq / L 20 mEq (10/04/18 1806)     LOS: 12 days    Time spent: 55 min including family meeting    Nicolette Bang, MD [facility]  If 7PM-7AM, please contact night-coverage  10/06/2018, 11:41 AM

## 2018-10-06 NOTE — Procedures (Signed)
Cortrak  Tube Type:  Cortrak - 43 inches Tube Location:  Left nare Initial Placement:  Stomach Secured by: Bridle Technique Used to Measure Tube Placement:  Documented cm marking at nare/ corner of mouth Cortrak Secured At:  61 cm    Cortrak Tube Team Note:  Consult received to place a Cortrak feeding tube.   X-ray is required, abdominal x-ray has been ordered by the Cortrak team. Please confirm tube placement before using the Cortrak tube.   If the tube becomes dislodged please keep the tube and contact the Cortrak team at [email or web address] (password [email or web address]) for replacement.  If after hours and replacement cannot be delayed, place a NG tube and confirm placement with an abdominal x-ray.    Koleen Distance MS, RD, LDN Pager #- [phone] Office#- [phone] After Hours Pager: [phone]

## 2018-10-06 NOTE — Plan of Care (Signed)
  Problem: Education: Goal: Knowledge of General Education information will improve Description Including pain rating scale, medication(s)/side effects and non-pharmacologic comfort measures Outcome: Progressing Note:  POC and skin care reviewed with pt. and family; pt. seemed a little more alert.

## 2018-10-06 NOTE — Progress Notes (Addendum)
NEUROLOGY PROGRESS NOTE  Subjective: Patient in bed, still appears NAD.  Patient still on room air.  Son is at bedside.  Grimaces to pain but also with translation from son apparently is able to count my fingers.  Hard to get a good exam on patient.  No continued twitching on exam.  Per son has improved over the last 3 days.  Exam: Vitals:   10/06/18 0727 10/06/18 0739  BP:  (!) 191/68  Pulse:    Resp: 16 20  Temp:  98.9 F (37.2 C)  SpO2: 100% 95%    Physical Exam   HEENT-  Normocephalic, no lesions, without obvious abnormality.  Normal external eye and conjunctiva. Extremities- Warm, dry and intact, fluid overloaded Musculoskeletal-no joint tenderness, deformity or swelling Skin-warm and dry, no hyperpigmentation, vitiligo, or suspicious lesions    Neuro:  Mental Status: Patient is alert.  Able to count fingers but I could not get her to count fingers in all quadrants.  Very difficult to have her follow commands. Cranial Nerves: II: I did get her to blink to threat bilaterally.  She does tend to resist opening eyes when objective testing is obtained however when left alone she will open her eyes and look around the room III,IV, VI: extra-ocular motions intact bilaterally pupils equal, round, reactive to light and accommodation V,VII: Left facial droop VIII: Incised to voice  Motor: As noted in prior note withdraws from pain only.  Right arm appears to be stronger than left Sensory: Withdraws to pain in all 4 quadrants Deep Tendon Reflexes: 2+ and symmetric throughout upper extremities with no knee jerk or ankle jerk Plantars: Upgoing bilaterally     Medications:  Scheduled: . budesonide (PULMICORT) nebulizer solution  0.25 mg Nebulization BID  . chlorhexidine  15 mL Mouth Rinse BID  . cloNIDine  0.3 mg Transdermal Weekly  . [START ON 10/07/2018] [name] butt cream   Topical BID  . ipratropium-albuterol  3 mL Nebulization BID  . levothyroxine  37.5 mcg Intravenous  Daily  . LORazepam  2 mg Intravenous Once  . mouth rinse  15 mL Mouth Rinse q12n4p  . metoprolol tartrate  10 mg Intravenous Q6H   Continuous: . dextrose 5 % with KCl 20 mEq / L 20 mEq (10/04/18 1806)    Pertinent Labs/Diagnostics: Currently hooked up to LTM EEG awaiting final reading  CSF results for Katie Mosley, Katie Mosley (MRN [id number]) as of 10/02/2018 09:11  Ref. Range 10/01/2018 09:47  Appearance, CSF Latest Ref Range: CLEAR  CLEAR  Glucose, CSF Latest Ref Range: 40 - 70 mg/dL 73 (H)  RBC Count, CSF Latest Ref Range: 0 /cu mm 1 (H)  WBC, CSF Latest Ref Range: 0 - 5 /cu mm 1  Other Cells, CSF Unknown TOO FEW TO COUNT, SMEAR AVAILABLE FOR REVIEW  Color, CSF Latest Ref Range: COLORLESS  COLORLESS  Supernatant Unknown NOT INDICATED  Total  Protein, CSF Latest Ref Range: 15 - 45 mg/dL 23  Tube # Unknown 3   --CSF fungal culture negative -CSF bacterial culture shows no growth -Cryptococcal antigen negative -HSV 1 and 2 negative - CSF IgG 2.2  LTM EEG report for 10/02/2018: This wasa moderately abnormal continuous video EEG due to generalized background slowing with triphasic waves, indicative of a toxic-metabolic encephalopathy pattern. No seizures or epileptiform discharges were seen.  Dg Chest Port 1 View  Result Date: 10/04/2018 CLINICAL DATA:  Shortness of Breath EXAM: PORTABLE CHEST 1 VIEW COMPARISON:  September 30, 2018 FINDINGS: There are pleural  effusions bilaterally. There appears to be loculated pleural effusion on the left, a change from previous study. There is cardiomegaly with pulmonary venous hypertension. There is a degree of underlying interstitial edema. There is aortic atherosclerosis. No bone lesions evident. IMPRESSION: Pulmonary vascular congestion with interstitial edema and pleural effusions. There is apparent loculated effusion on the left. A degree of superimposed airspace consolidation in this area can not be excluded. The overall appearance is felt to be most  consistent with congestive heart failure. There is aortic atherosclerosis. Electronically Signed   By: Lowella Grip III M.D.   On: 10/04/2018 10:29     Katie Quill PA-C [facility] [phone]   Assessment: 78 year old female with encephalopathy in the context of pneumonia, hypothyroidism and worsening renal function.  Thus far neurological test such as EEG and LP have been negative.  Etiology for her altered mental status is most likely toxic/metabolic.  Patient's hypernatremia has slowly decreased from 1 56-1 48.  Renal function seems to have improved from 2.71 down to 1.86.  As noted as patient's metabolic issues have improved patient has become more responsive per son.  Impression:  Toxic metabolic encephalopathy.  Recommendations: -Continue supportive care -Continue to correct metabolic derangements -At this time we will leave further supportive care and care patient and primary hands  -At this time neurology will sign off    10/06/2018, 9:30 AM    NEUROHOSPITALIST ADDENDUM Performed a face to face diagnostic evaluation.   I have reviewed the contents of history and physical exam as documented by PA/ARNP/Resident and agree with above documentation.  I have discussed and formulated the above plan as documented. Edits to the note have been made as needed.  This is a 78 year old female admitted for altered mental status.  She has had extensive neurological work-up including MRI brain which is unremarkable, long-term EEG, lumbar puncture which showed no obvious signs of infection.  Likely etiology for encephalopathy is patient pneumonia, hypothyroidism and acute kidney injury.      Katie Addison Vincen Bejar MD [facility] [phone]   If 7pm to 7am, please call on call as listed on AMION.

## 2018-10-06 NOTE — Progress Notes (Signed)
[facility]    NEPHROLOGY PROGRESS NOTE  SUBJECTIVE: Lethargic, unable to provide review of systems.  No acute events.  OBJECTIVE:  Vitals:   10/06/18 0739 10/06/18 1201  BP: (!) 191/68 (!) 163/61  Pulse:    Resp: 20 14  Temp: 98.9 F (37.2 C) 98.9 F (37.2 C)  SpO2: 95% 100%    Intake/Output Summary (Last 24 hours) at 10/06/2018 1253 Last data filed at 10/06/2018 3664 Gross per 24 hour  Intake 1505 ml  Output 200 ml  Net 1305 ml      General: Eyes open, does not answer questions.  NAD HEENT: MMM [place] AT anicteric sclera Neck:  No JVD, no adenopathy CV:  Heart RRR  Lungs:  L/S coarse bilaterally Abd:  abd SNT/ND with normal BS GU:  Bladder non-palpable Extremities: +1 bilateral LE edema. Skin:  No skin rash  MEDICATIONS:  . budesonide (PULMICORT) nebulizer solution  0.25 mg Nebulization BID  . chlorhexidine  15 mL Mouth Rinse BID  . cloNIDine  0.3 mg Transdermal Weekly  . [START ON 10/07/2018] [name] butt cream   Topical BID  . ipratropium-albuterol  3 mL Nebulization BID  . levothyroxine  37.5 mcg Intravenous Daily  . LORazepam  2 mg Intravenous Once  . mouth rinse  15 mL Mouth Rinse q12n4p  . metoprolol tartrate  10 mg Intravenous Q6H       LABS:   CBC Latest Ref Rng & Units 10/05/2018 10/04/2018 10/02/2018  WBC 4.0 - 10.5 K/uL 4.4 4.3 4.2  Hemoglobin 12.0 - 15.0 g/dL 9.1(L) 8.9(L) 8.4(L)  Hematocrit 36.0 - 46.0 % 29.0(L) 28.5(L) 26.6(L)  Platelets 150 - 400 K/uL 190 184 205    CMP Latest Ref Rng & Units 10/06/2018 10/05/2018 10/04/2018  Glucose 70 - 99 mg/dL 127(H) 111(H) 156(H)  BUN 8 - 23 mg/dL 61(H) 66(H) 69(H)  Creatinine 0.44 - 1.00 mg/dL 1.86(H) 2.34(H) 2.38(H)  Sodium 135 - 145 mmol/L 148(H) 149(H) 150(H)  Potassium 3.5 - 5.1 mmol/L 4.1 4.1 3.8  Chloride 98 - 111 mmol/L 121(H) 123(H) 123(H)  CO2 22 - 32 mmol/L 18(L) 18(L) 17(L)  Calcium 8.9 - 10.3 mg/dL 8.0(L) 8.2(L) 8.2(L)  Total Protein 6.5 - 8.1 g/dL - - 5.6(L)  Total Bilirubin  0.3 - 1.2 mg/dL - - 0.9  Alkaline Phos 38 - 126 U/L - - 53  AST 15 - 41 U/L - - 31  ALT 0 - 44 U/L - - 17    Lab Results  Component Value Date   CALCIUM 8.0 (L) 10/06/2018   CAION 1.19 08/07/2015   PHOS 4.6 09/29/2018       Component Value Date/Time   COLORURINE AMBER (A) 09/28/2018 0759   APPEARANCEUR CLOUDY (A) 09/28/2018 0759   LABSPEC 1.015 09/28/2018 0759   PHURINE 6.0 09/28/2018 0759   GLUCOSEU NEGATIVE 09/28/2018 0759   HGBUR LARGE (A) 09/28/2018 0759   BILIRUBINUR NEGATIVE 09/28/2018 0759   KETONESUR NEGATIVE 09/28/2018 0759   PROTEINUR 100 (A) 09/28/2018 0759   UROBILINOGEN 1.0 06/08/2015 1724   NITRITE NEGATIVE 09/28/2018 0759   LEUKOCYTESUR MODERATE (A) 09/28/2018 0759      Component Value Date/Time   PHART 7.371 09/27/2018 0902   PCO2ART 32.3 09/27/2018 0902   PO2ART 104 09/27/2018 0902   HCO3 18.2 (L) 09/27/2018 0902   TCO2 22 08/07/2015 2329   ACIDBASEDEF 5.8 (H) 09/27/2018 0902   O2SAT 97.8 09/27/2018 0902       Component Value Date/Time   IRON 144 09/24/2018 4034  TIBC 358 09/24/2018 0605   FERRITIN 139 09/24/2018 0605   IRONPCTSAT 40 (H) 09/24/2018 9470       ASSESSMENT/PLAN:       1.  Chronic kidney disease stage III with a baseline serum creatinine around 1.3-1.6.  This is likely on the basis of longstanding hypertension and atherosclerotic cardiovascular disease.  2.  Acute kidney injury.  This is likely on the basis of decreased renal perfusion in the setting of volume depletion.  Serum creatinine is stable around 2.3.  3.  Hypernatremia.  Likely secondary to poor p.o. intake.  Challenging situation as she is not tolerating IV fluids well due to underlying congestive heart failure.  Ultimately, she needs to be hydrated orally.  If the family desires would place a PEG tube.  Continue D5 W at current rate.  4.  Hypertension.  She originally presented with hypertensive urgency, which has somewhat improved.  Would plan regular IV  hypertensives until the PEG tube is placed.  Will increase hydralazine dose.  5.  Acute on chronic diastolic congestive heart failure.  Recommend PEG tube placement as continuing IV fluids well be challenging.  Avoid sodium in IV fluids.    [place], DO, [place]

## 2018-10-06 NOTE — Evaluation (Signed)
Clinical/Bedside Swallow Evaluation Patient Details  Name: Katie Mosley MRN: [id number] Date of Birth: 07-29-1941  Today's Date: 10/06/2018 Time: SLP Start Time (ACUTE ONLY): 1430 SLP Stop Time (ACUTE ONLY): 1500 SLP Time Calculation (min) (ACUTE ONLY): 30 min  Past Medical History:  Past Medical History:  Diagnosis Date  . Adrenal insufficiency ([place])   . Adrenal insufficiency ([place])    Dx'd [facility] 05/2015  . Asthma   . CHF (congestive heart failure) ([place])   . GERD (gastroesophageal reflux disease)   . Glaucoma   . Headache   . HTN (hypertension)   . Hypertension   . Hypothyroidism   . Normocytic anemia 09/07/2018  . Shortness of breath dyspnea   . Thyroid disease    not know in detail   Past Surgical History:  Past Surgical History:  Procedure Laterality Date  . APPENDECTOMY    . BACK SURGERY    . CAPSULOTOMY  08/23/2011   Procedure: MINOR CAPSULOTOMY;  Surgeon: Myrtha Mantis.;  Location: [place];  Service: Ophthalmology;  Laterality: Left;  YAG Capsulotomy Left Eye  . ESOPHAGOGASTRODUODENOSCOPY N/A 03/17/2013   Procedure: ESOPHAGOGASTRODUODENOSCOPY (EGD);  Surgeon: Irene Shipper, MD;  Location: [facility] ENDOSCOPY;  Service: Endoscopy;  Laterality: N/A;  . LEFT CATARACT EXTRACTION     2007  . 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  Pt with h/o CHF, renal dysfunction. 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  Results of MBS 09/25/2018 indicated oropharyngeal phase and pharyngoesophageal phase dysphagia with aspiration noted before and after the swallow.   Assessment / Plan / Recommendation Clinical Impression  New orders received for re-assessment, given improvement in pt status over the past few days. Pt was alert today, but has difficulty following directions. Pt's  granddaughter was present and facilitated communication with pt. Oral care was completed with suction. Dried bloody secretions noted in oral cavity. Pt accepted small boluses of nectar thick liquid, but was noted to hold the liquid without triggering a swallow. Oral cavity suctioned.   Pt appears to be significantly weak, and is at high risk for inadequate nutrition/hydration. Cortrak has been placed for nutrition/hydration. SLP will continue to follow to assess readiness for repeat MBS as pt strength and endurance improve.     SLP Visit Diagnosis: Dysphagia, oropharyngeal phase (R13.12)    Aspiration Risk  Severe aspiration risk;Risk for inadequate nutrition/hydration    Diet Recommendation NPO   Medication Administration: Via alternative means    Other  Recommendations Oral Care Recommendations: Oral care prior to ice chip/H20 Other Recommendations: Have oral suction available   Follow up Recommendations (TBD)      Frequency and Duration min 2x/week  2 weeks       Prognosis Prognosis for Safe Diet Advancement: Fair Barriers to Reach Goals: Time post onset(weakness)      Swallow Study   General Date of Onset: 09/24/18 HPI: pt is a 78 yo female adm to [facility] with AMS x3 days, CT head + basal ganglia and left cerebellar calcification, 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  Pt with h/o CHF, renal dysfunction. 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  Results of MBS indicated oropharyngeal phase and pharyngoesophageal phase  dysphagia.   Type of Study: Bedside Swallow Evaluation Previous Swallow Assessment: MBS 09/25/2018 - aspiration before and after the swallow seen. Diet Prior to this Study: NPO Temperature Spikes Noted: No Respiratory Status: Room air History of Recent Intubation: No Behavior/Cognition: Alert;Cooperative;Requires cueing Oral Cavity Assessment: Dried secretions Oral Care Completed by SLP: Yes Oral Cavity -  Dentition: Missing dentition;Poor condition Vision: Functional for self-feeding Self-Feeding Abilities: Total assist Patient Positioning: Upright in bed Baseline Vocal Quality: Low vocal intensity Volitional Cough: Cognitively unable to elicit Volitional Swallow: Unable to elicit    Oral/Motor/Sensory Function Overall Oral Motor/Sensory Function: (unable to assess)   Ice Chips Ice chips: Not tested   Thin Liquid Thin Liquid: Not tested    Nectar Thick Nectar Thick Liquid: Impaired Presentation: Spoon Oral Phase Impairments: Poor awareness of bolus Oral phase functional implications: Oral holding Pharyngeal Phase Impairments: Unable to trigger swallow Other Comments: oral residue suctioned from oral cavity   Honey Thick Honey Thick Liquid: Not tested   Puree Puree: Not tested   Solid     Solid: Not tested     Celia B. Quentin Ore, [facility], [place] Speech Language Pathologist [phone]  Shonna Chock 10/06/2018,3:16 PM

## 2018-10-07 LAB — BASIC METABOLIC PANEL
Anion gap: 5 (ref 5–15)
BUN: 50 mg/dL — ABNORMAL HIGH (ref 8–23)
CO2: 19 mmol/L — ABNORMAL LOW (ref 22–32)
Calcium: 8.2 mg/dL — ABNORMAL LOW (ref 8.9–10.3)
Chloride: 123 mmol/L — ABNORMAL HIGH (ref 98–111)
Creatinine, Ser: 1.62 mg/dL — ABNORMAL HIGH (ref 0.44–1.00)
GFR calc non Af Amer: 30 mL/min — ABNORMAL LOW (ref >60)
GFR, EST AFRICAN AMERICAN: 35 mL/min — AB (ref >60)
Glucose, Bld: 162 mg/dL — ABNORMAL HIGH (ref 70–99)
Potassium: 4.3 mmol/L (ref 3.5–5.1)
Sodium: 147 mmol/L — ABNORMAL HIGH (ref 135–145)

## 2018-10-07 LAB — CBC WITH DIFFERENTIAL/PLATELET
Abs Immature Granulocytes: 0 10*3/uL (ref 0.00–0.07)
Basophils Absolute: 0 10*3/uL (ref 0.0–0.1)
Basophils Relative: 0 %
Eosinophils Absolute: 0 10*3/uL (ref 0.0–0.5)
Eosinophils Relative: 0 %
HCT: 29.1 % — ABNORMAL LOW (ref 36.0–46.0)
HEMOGLOBIN: 9.6 g/dL — AB (ref 12.0–15.0)
Lymphocytes Relative: 6 %
Lymphs Abs: 0.1 10*3/uL — ABNORMAL LOW (ref 0.7–4.0)
MCH: 31.2 pg (ref 26.0–34.0)
MCHC: 33 g/dL (ref 30.0–36.0)
MCV: 94.5 fL (ref 80.0–100.0)
Metamyelocytes Relative: 1 %
Monocytes Absolute: 0 10*3/uL — ABNORMAL LOW (ref 0.1–1.0)
Monocytes Relative: 1 %
Neutro Abs: 1.8 10*3/uL (ref 1.7–7.7)
Neutrophils Relative %: 92 %
Platelets: 159 10*3/uL (ref 150–400)
RBC: 3.08 MIL/uL — ABNORMAL LOW (ref 3.87–5.11)
RDW: 16.4 % — ABNORMAL HIGH (ref 11.5–15.5)
WBC: 2 10*3/uL — AB (ref 4.0–10.5)
nRBC: 0 % (ref 0.0–0.2)
nRBC: 0 /100 WBC

## 2018-10-07 LAB — GLUCOSE, CAPILLARY
Glucose-Capillary: 162 mg/dL — ABNORMAL HIGH (ref 70–99)
Glucose-Capillary: 125 mg/dL — ABNORMAL HIGH (ref 70–99)

## 2018-10-07 LAB — OCCULT BLOOD X 1 CARD TO LAB, STOOL: Fecal Occult Bld: NEGATIVE

## 2018-10-07 NOTE — Progress Notes (Signed)
Physical Therapy Treatment Patient Details Name: Katie Mosley MRN: [id number] DOB: 21-Aug-1941 Today's Date: 10/07/2018    History of Present Illness 78 yo female admitted to ED on 1/28 for weakness and fatigue, with medical diagnosis of sepsis with possible PNA. PMH includes adrenal insufficiency, hypothyroid, CHF, HTN, hypothyroidism, dyspnea, CKDIII.     PT Comments    Pt a bit more alert than last session and occasionally responding to sound and tactile cues, does not respond at all to visual cues and does not show signs of being able to see. Pt needed tot A to get to EOB, maintained sitting with tot A, then position change helped her activate core musculature more and she was able to sit for short period with mod A. Though she is interacting a little more per family report, this has not led to a significant functional change. May need to sign off acute PT if no change next visit.     Follow Up Recommendations  SNF     Equipment Recommendations  None recommended by PT    Recommendations for Other Services       Precautions / Restrictions Precautions Precautions: Fall Precaution Comments: pt is [place], son translates Required Braces or Orthoses: Other Brace Other Brace: L PRAFO Restrictions Weight Bearing Restrictions: No    Mobility  Bed Mobility Overal bed mobility: Needs Assistance Bed Mobility: Supine to Sit;Sit to Supine     Supine to sit: Total assist;+2 for physical assistance Sit to supine: Total assist;+2 for physical assistance   General bed mobility comments: pt with increased tone noted RLE as she was brought into sitting. Pt did not assist or resist move into sitting  Transfers                 General transfer comment: unable  Ambulation/Gait                 Stairs             Wheelchair Mobility    Modified Rankin (Stroke Patients Only)       Balance Overall balance assessment: Needs assistance Sitting-balance  support: Bilateral upper extremity supported;Feet supported Sitting balance-Leahy Scale: Poor Sitting balance - Comments: pt with posterior pelvic tilt in sitting with tot A to prevent posterior LOB. Worked on [employer] on LUE and pt able to keep this position with mod A though was insecure here. After this, pt brought fwd with elbows on knees and maintained with mod A and seemed to be attempting to move her hands some Postural control: Posterior lean;Right lateral lean                                  Cognition Arousal/Alertness: Awake/alert Behavior During Therapy: Flat affect Overall Cognitive Status: Impaired/Different from baseline Area of Impairment: Orientation;Attention;Memory;Safety/judgement;Following commands;Problem solving;Awareness                 Orientation Level: Person;Place;Time;Situation     Following Commands: Follows one step commands inconsistently Safety/Judgement: Decreased awareness of safety;Decreased awareness of deficits   Problem Solving: Requires tactile cues;Difficulty sequencing;Decreased initiation;Slow processing;Requires verbal cues General Comments: didn't respond to vc's seemed to respond better to tactile cueing and likely experiencing visual deficits. Occasional smile and attempt to talk when her grandsons spoke to her. Grandsons report that her speech is slurred and they couldn't understand her      Exercises General Exercises - Lower Extremity Ankle Circles/Pumps:  PROM;Both;10 reps;Supine Long Arc Quad: PROM;Both;10 reps;Seated    General Comments General comments (skin integrity, edema, etc.): pt does not seem to be seeing but rather responds to sounds and touch. Consistently keeps head to right      Pertinent Vitals/Pain Pain Assessment: Faces Faces Pain Scale: No hurt Pain Intervention(s): Monitored during session    Home Living Family/patient expects to be discharged to:: Private residence Living Arrangements:  Children(son and daughter in law) Available Help at Discharge: Family;Available 24 hours/day(per chart daughter in law available 24/7 and aide comes 3 hrs/daily for pt) Type of Home: House Home Access: Stairs to enter Entrance Stairs-Rails: Right;Left;Can reach both Home Layout: One level Home Equipment: Walker - 2 wheels      Prior Function Level of Independence: Needs assistance  Gait / Transfers Assistance Needed: Per pt's son, pt was independent with mobility and did not need AD or physical assist to perform mobility tasks, no history of falls. ADL's / Homemaking Assistance Needed: Pt with personal care aide 3 hours/day to assist with bathing and cleaning pt's room, but pt's son also states she was independent with bathing/dressing      PT Goals (current goals can now be found in the care plan section) Acute Rehab PT Goals Patient Stated Goal: to reduce caregiver burden PT Goal Formulation: With family Time For Goal Achievement: 10/09/18 Potential to Achieve Goals: Poor Progress towards PT goals: Progressing toward goals    Frequency    Min 2X/week      PT Plan Current plan remains appropriate    Co-evaluation PT/OT/SLP Co-Evaluation/Treatment: Yes Reason for Co-Treatment: Complexity of the patient's impairments (multi-system involvement);Necessary to address cognition/behavior during functional activity;For patient/therapist safety PT goals addressed during session: Mobility/safety with mobility;Strengthening/ROM;Balance OT goals addressed during session: ADL's and self-care;Other (comment)(balance/mobility)      AM-PAC PT "6 Clicks" Mobility   Outcome Measure  Help needed turning from your back to your side while in a flat bed without using bedrails?: Total Help needed moving from lying on your back to sitting on the side of a flat bed without using bedrails?: Total Help needed moving to and from a bed to a chair (including a wheelchair)?: Total Help needed standing  up from a chair using your arms (e.g., wheelchair or bedside chair)?: Total Help needed to walk in hospital room?: Total Help needed climbing 3-5 steps with a railing? : Total 6 Click Score: 6    End of Session   Activity Tolerance: Patient tolerated treatment well Patient left: in bed;with call bell/phone within reach;with family/visitor present Nurse Communication: Mobility status PT Visit Diagnosis: Other abnormalities of gait and mobility (R26.89);Difficulty in walking, not elsewhere classified (R26.2)     Time: 6606-3016 PT Time Calculation (min) (ACUTE ONLY): 26 min  Charges:  $Neuromuscular Re-education: 8-22 mins                     Leighton Roach, PT  Acute Rehab Services  Pager [phone] Office [place] 10/07/2018, 5:06 PM

## 2018-10-07 NOTE — Plan of Care (Signed)
  Problem: Health Behavior/Discharge Planning: Goal: Ability to manage health-related needs will improve Outcome: Progressing   Problem: Education: Goal: Knowledge of General Education information will improve Description: Including pain rating scale, medication(s)/side effects and non-pharmacologic comfort measures Outcome: Progressing   

## 2018-10-07 NOTE — Progress Notes (Signed)
PROGRESS NOTE    Katie Mosley  BJS:[id number] DOB: 1940/11/22 DOA: 09/23/2018 PCP: Nolene Ebbs, MD   Brief Narrative:  As previously noted:[name] a 78 y.o.femalewithhistory of diastolic CHF last EF measured in March 2017 was 60 to 65% with grade 1 diastolic dysfunction, chronic hyponatremia, chronic kidney disease stage III, chronic anemia, hypothyroidism hypertension presents to the ER with complaints of having fatigue and shortness of breath for the last 1 week. Patient also noticed increasing swelling in the lower extremities. Has had some productive cough. Patient also has been feeling weak and fatigued easily. Has not had any dizziness fall or loss of consciousness.Patient was admitted to [facility] long. Work-up is been negative for encephalopathic process. She was subsequently transferred to [facility] for further evaluation as noted below.  ED Course:In the ER patient EKG shows sinus bradycardia with a heart rate around 54 bpm. Nonspecific ST-T changes. Labs reveal sodium of 123 which is markedly low from her previous. On exam patient has bilateral crepitations with lower extremity edema. Chest x-ray shows peripheral congestion. Patient's hemoglobin is at baseline. But does have some leukopenia. Patient is afebrile. Initially patient was given fluids in the ER by ER physician but given that patient has been likely CHF, now havediscontinued the fluids and start patient on Lasix.  09/27/2018: Patient transferred to [facility].patient's mental status continues to wax and wane. Patient is currently awake but nonverbal, confused, unable to also questions. Family's is always by the bedside. All questions and concerns have been addressed. Patient continues to decline despite extensive testing and interventions. Prognosis remains guarded.Neurology consulted as last resort ,recommed LTM EEG, and LP,   10/01/2018: Transfer to [facility] per neuro recommendations for LP,  LTM EEG. Neuro work-up for encephalopathy has been completely negative, no etiology of patient's altered mental status was found other than multifactorial for hyponatremia, infection, chronci cog deficits, chronic comordbidities. Family reported a similar epsidoe lasting 3-4 days about 4 years ago, but not as severe.  Patient's mental status improved dramatically over the last 24-36 hours from hospice/palliative to awake alert and interacting with family  Assessment & Plan:   Principal Problem:   Sepsis ([place]) Active Problems:   Hyponatremia   Hypothyroidism   Hypothermia   Acute encephalopathy   Normocytic anemia   Leukopenia   CKD (chronic kidney disease), stage IV ([place])   DNR (do not resuscitate) discussion   Acute encephalopathy, probably combined toxic and metabolic encephalopathy. Patient has improved considerably over the last 24 hours. Etiology is thought to be multifocal initially to include underlying infection complicated hyponatremia and adult failure to thrive. She was initially seen by speech at [facility] long with recommendations for nectar thick liquids patient was seen by speech he can February 10.  Recommend continue n.p.o., cortrak placed with tube feeds. Patient's work-up to date has shown negative respiratory virus panel CT and MRI without acute findings although atrophy identified, EEG showing encephalopathic process, LP without evidence of infection with clear fluid and 1 white blood cell as well as negative Gram stain andculture.Family does not want to pursue PEG tube, this limits transitioning patient out of the hospital.  Reordered PT OT.  Pneumonia suspect gram-negative organism. As previously noted chest x-ray from 1/29 showed right-sided haziness consistent with infiltrate patient received meropenem then cefepime. She completed a 5-day course with a negative procalcitonin negative respiratory panel normal white blood cell count afebrile. No evidence of  infection, repeat chest x-ray showing improvement in edema and likely atelectasis with small  effusions.  Hypothyroidcontinuing IV Synthroid at this point time  Hypernatremiamildly improved with hydration D5 quarter normal down to 147. Patient serum osmole's were 347 prior to hydration. Cautious use of hydration as patient easily becomes volume overloaded.  Chronic dysphasia.Suspect is underlying element of aspiration. Patient was seen by speech at [facility] and as noted they recommended nectar thick liquids, unfortunately patient unable to tolerate any p.o. intake per reevaluation on speech February 10, family does not want a PEG tube,Cortrak placed 2/10.  Acute on chronic kidney disease.Patient reported creatinine baseline is 1.3-1.6. On transfer from was a long was noted to creatinine been climbing from 2.2-2.7 upon transfer.  After gentle hydration, patient's creatinine now near normal at 1.6.  Nephrology did see in consultation and signed off.  Hypertensive urgency. Patient had an episode of hypertensive urgency pre-transfer. Currently  we are using PRN antihypertensives, most recent blood pressure 146/56  DVT prophylaxis: SCD/Compression stockings  Code Status: Full code    Code Status Orders  (From admission, onward)         Start     Ordered   09/26/18 0946  Full code  Continuous     09/26/18 0947        Code Status History    Date Active Date Inactive Code Status Order ID Comments User Context   09/24/2018 1001 09/26/2018 0947 DNR [id number]  Roney Jaffe, MD ED   09/24/2018 0423 09/24/2018 1001 Full Code [id number]  Rise Patience, MD ED   10/29/2015 0046 11/05/2015 1331 Full Code [id number]  Eugenie Filler, MD Inpatient   08/08/2015 0432 08/13/2015 1739 Full Code [id number]  Edwin Dada, MD Inpatient   02/13/2015 0552 02/18/2015 1505 Full Code [id number]  Ivor Costa, MD Inpatient   10/31/2014 1332 11/03/2014 1820 Full Code [id number]  Orson Eva, MD Inpatient       Family Communication: son   Disposition Plan:   snf vs home Consults called: None Admission status: Inpatient   Consultants:   nephrology, neuology  Procedures:  Dg Chest 2 View  Result Date: 09/24/2018 CLINICAL DATA:  Shortness of breath. EXAM: CHEST - 2 VIEW COMPARISON:  06/22/2018 and 10/16/2017 FINDINGS: There is new pulmonary vascular congestion. There is new peripheral haziness in the right lung cyst pulmonary edema. No discrete effusions. Heart is within normal limits of size considering the AP portable technique. Aortic atherosclerosis.  No acute bone abnormalities. IMPRESSION: New pulmonary vascular congestion with slight peripheral pulmonary edema on the right. Electronically Signed   By: Lorriane Shire M.D.   On: 09/24/2018 04:12   Dg Abd 1 View  Result Date: 09/28/2018 CLINICAL DATA:  Ileus EXAM: ABDOMEN - 1 VIEW COMPARISON:  09/27/2018 FINDINGS: Nonobstructive bowel gas pattern. Mild residual contrast within ascending and transverse colon. No colonic dilatation. Degenerative changes of the lumbar spine. IMPRESSION: Unremarkable abdominal radiograph. Electronically Signed   By: Julian Hy M.D.   On: 09/28/2018 05:08   Dg Abd 1 View  Result Date: 09/27/2018 CLINICAL DATA:  Encounter for feeding tube placement. EXAM: ABDOMEN - 1 VIEW COMPARISON:  Radiograph of same day.  CT scan of June 22, 2018. FINDINGS: No small bowel dilatation is noted. Residual contrast is seen in the colon. Colonic dilatation is noted suggesting possible ileus. No abnormal calcifications are noted. IMPRESSION: Colonic dilatation is noted in left side of abdomen concerning for possible ileus. Electronically Signed   By: Marijo Conception, M.D.   On: 09/27/2018 12:21   Ct Head Wo Contrast  Result  Date: 09/27/2018 CLINICAL DATA:  Neurological deficits with involuntary movements. EXAM: CT HEAD WITHOUT CONTRAST TECHNIQUE: Contiguous axial images were obtained from the base of the skull through the  vertex without intravenous contrast. COMPARISON:  09/24/2018 FINDINGS: Brain: Diffuse cerebral atrophy. Ventricular dilatation consistent with central atrophy. Low-attenuation changes in the deep white matter consistent with small vessel ischemia. Old parenchymal calcifications in the basal ganglia and left cerebellum without change. No mass effect or midline shift. No abnormal extra-axial fluid collections. Gray-white matter junctions are distinct. Basal cisterns are not effaced. No acute intracranial hemorrhage. Vascular: Prominent intracranial arterial vascular calcifications are present. Skull: Calvarium appears intact. No acute displaced fractures identified. Sinuses/Orbits: Paranasal sinuses and mastoid air cells are clear. Other: No significant change since prior study. IMPRESSION: No acute intracranial abnormalities. Chronic atrophy and small vessel ischemic changes. Electronically Signed   By: Lucienne Capers M.D.   On: 09/27/2018 03:56   Ct Head Wo Contrast  Result Date: 09/24/2018 CLINICAL DATA:  78 y/o  F; 3 days of weakness. EXAM: CT HEAD WITHOUT CONTRAST TECHNIQUE: Contiguous axial images were obtained from the base of the skull through the vertex without intravenous contrast. COMPARISON:  10/16/2017 CT head. FINDINGS: Brain: No evidence of acute infarction, hemorrhage, hydrocephalus, extra-axial collection or mass lesion/mass effect. Stable dystrophic calcifications in the basal ganglia and calcifications in the left cerebellar hemisphere. 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 Stable chronic microvascular ischemic changes and volume loss of the brain. Vascular: Calcific atherosclerosis of carotid siphons. No hyperdense vessel identified. Skull: Normal. Negative for fracture or focal lesion. Sinuses/Orbits: No acute finding. Other: Bilateral intra-ocular lens replacement. IMPRESSION: 1. No acute intracranial abnormality identified. 2. Stable chronic microvascular ischemic  changes and volume loss of the brain. Stable small chronic lacunar infarct in the right caudate nucleus. Electronically Signed   By: Kristine Garbe M.D.   On: 09/24/2018 05:15   Mr Brain Wo Contrast  Result Date: 09/29/2018 CLINICAL DATA:  Acute encephalopathy. Jerking movements. Progressive weakness. EXAM: MRI HEAD WITHOUT CONTRAST TECHNIQUE: Multiplanar, multiecho pulse sequences of the brain and surrounding structures were obtained without intravenous contrast. COMPARISON:  Head CT 09/27/2018 and MRI 08/08/2015 FINDINGS: Brain: There is no evidence of acute infarct, intracranial hemorrhage, mass, midline shift, or extra-axial fluid collection. Patchy to confluent T2 hyperintensities in the cerebral white matter are slightly progressed from the prior MRI and are nonspecific but compatible with moderate chronic small vessel ischemic disease. Mild T2 hyperintensity in the basal ganglia and thalami is similar to the prior MRI. 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 There is a small remote insult in the left cerebellum with associated calcification on CT. Vascular: Major intracranial vascular flow voids are preserved. Skull and upper cervical spine: Unremarkable bone marrow signal. Sinuses/Orbits: Bilateral cataract extraction. Clear paranasal sinuses. Trace mastoid effusions. Other: None. IMPRESSION: 1. No acute intracranial abnormality. 2. Moderate chronic small vessel ischemic disease and cerebral atrophy. Electronically Signed   By: Logan Bores M.D.   On: 09/29/2018 13:22   Dg Chest Port 1 View  Result Date: 10/06/2018 CLINICAL DATA:  CHF EXAM: PORTABLE CHEST 1 VIEW COMPARISON:  Chest x-rays dated 10/04/2018, 09/30/2018 and 09/24/2018 FINDINGS: Stable cardiomegaly. Persistent central pulmonary vascular congestion but improved aeration of both lungs compared to the most recent study of 10/04/2018. Persistent small bilateral pleural effusions and  probable associated atelectasis. Enteric tube passes below the diaphragm. IMPRESSION: 1. Improved aeration compared to the most recent  chest x-ray of 10/04/2018, presumably resolving pulmonary edema related to improved fluid status. 2. Stable bibasilar opacities, likely a combination of small pleural effusions and atelectasis. Electronically Signed   By: Franki Cabot M.D.   On: 10/06/2018 20:11   Dg Chest Port 1 View  Result Date: 10/04/2018 CLINICAL DATA:  Shortness of Breath EXAM: PORTABLE CHEST 1 VIEW COMPARISON:  September 30, 2018 FINDINGS: There are pleural effusions bilaterally. There appears to be loculated pleural effusion on the left, a change from previous study. There is cardiomegaly with pulmonary venous hypertension. There is a degree of underlying interstitial edema. There is aortic atherosclerosis. No bone lesions evident. IMPRESSION: Pulmonary vascular congestion with interstitial edema and pleural effusions. There is apparent loculated effusion on the left. A degree of superimposed airspace consolidation in this area can not be excluded. The overall appearance is felt to be most consistent with congestive heart failure. There is aortic atherosclerosis. Electronically Signed   By: Lowella Grip III M.D.   On: 10/04/2018 10:29   Dg Chest Port 1 View  Result Date: 09/30/2018 CLINICAL DATA:  Fever. EXAM: PORTABLE CHEST 1 VIEW COMPARISON:  Chest x-ray 09/27/2018. FINDINGS: Cardiomegaly with bilateral interstitial prominence. Improved aeration from prior exam. Persistent right lower lobe alveolar infiltrate. This could represent asymmetric pulmonary edema and/or pneumonia. Small bilateral pleural effusions. IMPRESSION: 1. Cardiomegaly with bilateral interstitial prominence, improved aeration from prior exam. Findings suggesting improving CHF. Small bilateral pleural effusions. 2. Persistent right base infiltrate. This could represent asymmetric pulmonary edema and/or pneumonia. Similar findings  on prior exam. Electronically Signed   By: Brethren   On: 09/30/2018 13:34   Dg Chest Port 1 View  Result Date: 09/27/2018 CLINICAL DATA:  Acute respiratory failure. EXAM: PORTABLE CHEST 1 VIEW COMPARISON:  Chest x-ray dated September 25, 2018. FINDINGS: The patient is rotated to the left, limiting evaluation. Stable cardiomegaly. Unchanged mild interstitial edema, small bilateral pleural effusions, and bibasilar atelectasis. Apparent increased density at the right lung base is likely due to rotation. No pneumothorax. No acute osseous abnormality. IMPRESSION: 1. Unchanged mild interstitial edema, small bilateral pleural effusions, and bibasilar atelectasis. Electronically Signed   By: Titus Dubin M.D.   On: 09/27/2018 10:33   Dg Chest Port 1 View  Result Date: 09/25/2018 CLINICAL DATA:  Follow-up swallow study. EXAM: PORTABLE CHEST 1 VIEW COMPARISON:  Radiographs of September 24, 2018. FINDINGS: Stable cardiomegaly is noted. Atherosclerosis of thoracic aorta is noted. No pneumothorax is noted. Minimal bibasilar subsegmental atelectasis is noted with minimal pleural effusions. Bony thorax is unremarkable. Triangular area of increased density is seen in the region of the upper thoracic spine; is uncertain if this represents retained contrast from prior swallowing study. Rounded barium tablet is not clearly identified. IMPRESSION: Minimal bibasilar subsegmental atelectasis is noted with minimal pleural effusions. Triangular area of increased density is seen projected over upper thoracic spine which potentially may represent residual contrast from prior swallowing study. Aortic Atherosclerosis (ICD10-I70.0). Electronically Signed   By: Marijo Conception, M.D.   On: 09/25/2018 13:18   Dg Abd Portable 1v  Result Date: 10/06/2018 CLINICAL DATA:  Evaluate feeding tube position. EXAM: PORTABLE ABDOMEN - 1 VIEW COMPARISON:  09/28/2018 FINDINGS: Feeding tube has been placed in the interval. This terminates at  the antral/pyloric region. Contrast remains in normal caliber colon. Bibasilar airspace disease. No gross free intraperitoneal air IMPRESSION: Feeding tube terminating at the antral/pyloric region. Electronically Signed   By: Abigail Miyamoto M.D.   On: 10/06/2018 18:14  Dg Swallowing Func-speech Pathology  Result Date: 09/25/2018 Objective Swallowing Evaluation: Type of Study: MBS-Modified Barium Swallow Study  Patient Details Name: GALIA RAHM MRN: [id number] Date of Birth: 1941/08/12 Today's Date: 09/25/2018 Time: SLP Start Time (ACUTE ONLY): 0750 -SLP Stop Time (ACUTE ONLY): 0804 SLP Time Calculation (min) (ACUTE ONLY): 14 min Past Medical History: Past Medical History: Diagnosis Date . Adrenal insufficiency ([place])  . Adrenal insufficiency ([place])   Dx'd [facility] 05/2015 . Asthma  . CHF (congestive heart failure) ([place])  . GERD (gastroesophageal reflux disease)  . Glaucoma  . Headache  . HTN (hypertension)  . Hypertension  . Hypothyroidism  . Normocytic anemia 09/07/2018 . Shortness of breath dyspnea  . Thyroid disease   not know in detail Past Surgical History: Past Surgical History: Procedure Laterality Date . APPENDECTOMY   . BACK SURGERY   . CAPSULOTOMY  08/23/2011  Procedure: MINOR CAPSULOTOMY;  Surgeon: Myrtha Mantis.;  Location: [place];  Service: Ophthalmology;  Laterality: Left;  YAG Capsulotomy Left Eye . ESOPHAGOGASTRODUODENOSCOPY N/A 03/17/2013  Procedure: ESOPHAGOGASTRODUODENOSCOPY (EGD);  Surgeon: Irene Shipper, MD;  Location: [facility] ENDOSCOPY;  Service: Endoscopy;  Laterality: N/A; . LEFT CATARACT EXTRACTION    2007 . RIGHT CATARACT EXTRACTION    2006 HPI: pt is a 78 yo female adm to [facility] with AMS x3 days, CT head + basal ganglia and left cerebellar calcification, 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  Pt with h/o CHF, renal dysfunction. Swallow evauluation ordered as pt had difficulty swallowing a pill this am.  Pt had a similar episode of "coma" 3 years ago due to hyponatremia that resolved  states son.   Subjective: pt awake in chair Assessment / Plan / Recommendation CHL IP CLINICAL IMPRESSIONS 09/25/2018 Clinical Impression Pt's swallow function was inconsistent throughout MBS - neck extension posture significantly worsened pharyngeal contraction and thus resulted in gross residuals of puree/thin. Oral deficits significant mostly with liquids resulting in liquids spilling into pharynx not controlled and resulting in aspiration before the swallow with nectar.  Aspiration after the swallow from pyriform sinus spillage did elicit a delayed cough but not adequately strong to clear pharynx.  Chin tuck posture improved airway protection significantly and prevented aspiration as well as decreased pharyngeal residuals.  Cued dry swallows helpful to clear oral residuals.  Of note, pt appeared with significant stasis in proximal esophagus without sensation *just below UES without awareness.  Pudding consistency with tablet did not clear this area despite follow up liquid swallows.  Dependent on family's goal for pt, consideration for GI referral recommended given son's report of chronicity of dysphagia for "a long time".  Recommend to start full liquid diet *nectar* and allow thin water between meals. Use straw to faciliate chin down posture for pt.  Son present and wishes to interpret *not ipad interpreter* therefore allowed in xray.  SLP will follow up for pt tolerance, family education.   SLP Visit Diagnosis Dysphagia, oropharyngeal phase (R13.12);Dysphagia, pharyngoesophageal phase (R13.14) Attention and concentration deficit following -- Frontal lobe and executive function deficit following -- Impact on safety and function Moderate aspiration risk;Risk for inadequate nutrition/hydration   CHL IP TREATMENT RECOMMENDATION 09/25/2018 Treatment Recommendations Therapy as outlined in treatment plan below   Prognosis 09/25/2018 Prognosis for Safe Diet Advancement Fair Barriers to Reach Goals Time post onset  Barriers/Prognosis Comment -- CHL IP DIET RECOMMENDATION 09/25/2018 SLP Diet Recommendations Nectar thick liquid Liquid Administration via -- Medication Administration Crushed with puree Compensations Slow rate;Small sips/bites Postural Changes --  CHL IP OTHER RECOMMENDATIONS 09/25/2018 Recommended Consults Consider GI evaluation Oral Care Recommendations Oral care before and after PO Other Recommendations Order thickener from pharmacy;Have oral suction available   CHL IP FOLLOW UP RECOMMENDATIONS 09/25/2018 Follow up Recommendations (No Data)   CHL IP FREQUENCY AND DURATION 09/25/2018 Speech Therapy Frequency (ACUTE ONLY) min 2x/week Treatment Duration 1 week      CHL IP ORAL PHASE 09/25/2018 Oral Phase Impaired Oral - Pudding Teaspoon -- Oral - Pudding Cup -- Oral - Honey Teaspoon -- Oral - Honey Cup -- Oral - Nectar Teaspoon Weak lingual manipulation;Decreased bolus cohesion;Reduced posterior propulsion;Premature spillage Oral - Nectar Cup -- Oral - Nectar Straw Weak lingual manipulation;Decreased bolus cohesion;Reduced posterior propulsion;Premature spillage;Delayed oral transit Oral - Thin Teaspoon Decreased bolus cohesion;Weak lingual manipulation;Reduced posterior propulsion;Premature spillage;Lingual/palatal residue;Delayed oral transit Oral - Thin Cup -- Oral - Thin Straw Reduced posterior propulsion;Weak lingual manipulation;Decreased bolus cohesion;Premature spillage;Lingual/palatal residue;Delayed oral transit Oral - Puree Decreased bolus cohesion;Weak lingual manipulation;Lingual pumping;Premature spillage;Lingual/palatal residue;Delayed oral transit Oral - Mech Soft Decreased bolus cohesion;Premature spillage;Delayed oral transit Oral - Regular -- Oral - Multi-Consistency -- Oral - Pill Decreased bolus cohesion;Premature spillage;Weak lingual manipulation;Lingual pumping;Delayed oral transit Oral Phase - Comment --  CHL IP PHARYNGEAL PHASE 09/25/2018 Pharyngeal Phase Impaired Pharyngeal- Pudding Teaspoon  -- Pharyngeal -- Pharyngeal- Pudding Cup -- Pharyngeal -- Pharyngeal- Honey Teaspoon -- Pharyngeal -- Pharyngeal- Honey Cup -- Pharyngeal -- Pharyngeal- Nectar Teaspoon -- Pharyngeal -- Pharyngeal- Nectar Cup -- Pharyngeal -- Pharyngeal- Nectar Straw WFL Pharyngeal -- Pharyngeal- Thin Teaspoon Penetration/Aspiration before swallow Pharyngeal Material enters airway, passes BELOW cords without attempt by patient to eject out (silent aspiration) Pharyngeal- Thin Cup -- Pharyngeal -- Pharyngeal- Thin Straw Penetration/Apiration after swallow;Pharyngeal residue - valleculae;Pharyngeal residue - pyriform Pharyngeal Material enters airway, passes BELOW cords without attempt by patient to eject out (silent aspiration);Material enters airway, passes BELOW cords and not ejected out despite cough attempt by patient Pharyngeal- Puree Reduced tongue base retraction;Reduced airway/laryngeal closure;Reduced laryngeal elevation;Pharyngeal residue - valleculae;Pharyngeal residue - pyriform;Reduced pharyngeal peristalsis Pharyngeal -- Pharyngeal- Mechanical Soft Reduced tongue base retraction;Pharyngeal residue - valleculae Pharyngeal -- Pharyngeal- Regular -- Pharyngeal -- Pharyngeal- Multi-consistency -- Pharyngeal -- Pharyngeal- Pill Pharyngeal residue - valleculae Pharyngeal -- Pharyngeal Comment pt with gross residuals in pharynx intermittently - most notably with head extended upward with swallow, dry swallow attempts are essentially not initiated as very minimal movement observed, aspiration of thin occured after the swallow as pyriform sinus residual spill into open airway - posterior trach,   CHL IP CERVICAL ESOPHAGEAL PHASE 09/25/2018 Cervical Esophageal Phase Impaired Pudding Teaspoon -- Pudding Cup -- Honey Teaspoon -- Honey Cup -- Nectar Teaspoon -- Nectar Cup -- Nectar Straw -- Thin Teaspoon -- Thin Cup -- Thin Straw -- Puree -- Mechanical Soft -- Regular -- Multi-consistency -- Pill -- Cervical Esophageal Comment pt  appeared with significant residuals below UES WITHOUT awareness, much more prominent with puree than liquids Macario Golds 09/25/2018, 10:45 AM Luanna Salk, MS [facility] SLP Acute Rehab Services Pager [phone] Office [phone]              Vas [place] Lower Extremity Venous (dvt)  Result Date: 09/24/2018  Lower Venous Study Indications: Edema.  Performing Technologist: Oliver Hum RVT  Examination Guidelines: A complete evaluation includes B-mode imaging, spectral Doppler, color Doppler, and power Doppler as needed of all accessible portions of each vessel. Bilateral testing is considered an integral part of a complete examination. Limited examinations for reoccurring indications may be performed as noted.  Right Venous Findings: +---------+---------------+---------+-----------+----------+-------+          CompressibilityPhasicitySpontaneityPropertiesSummary +---------+---------------+---------+-----------+----------+-------+ CFV      Full           Yes      Yes                          +---------+---------------+---------+-----------+----------+-------+ SFJ      Full                                                 +---------+---------------+---------+-----------+----------+-------+ FV Prox  Full                                                 +---------+---------------+---------+-----------+----------+-------+ FV Mid   Full                                                 +---------+---------------+---------+-----------+----------+-------+ FV DistalFull                                                 +---------+---------------+---------+-----------+----------+-------+ PFV      Full                                                 +---------+---------------+---------+-----------+----------+-------+ POP      Full           Yes      Yes                          +---------+---------------+---------+-----------+----------+-------+ PTV      Full                                                  +---------+---------------+---------+-----------+----------+-------+ PERO     Full                                                 +---------+---------------+---------+-----------+----------+-------+  Left Venous Findings: +---------+---------------+---------+-----------+----------+-------+          CompressibilityPhasicitySpontaneityPropertiesSummary +---------+---------------+---------+-----------+----------+-------+ CFV      Full           Yes      Yes                          +---------+---------------+---------+-----------+----------+-------+ SFJ      Full                                                 +---------+---------------+---------+-----------+----------+-------+  FV Prox  Full                                                 +---------+---------------+---------+-----------+----------+-------+ FV Mid   Full                                                 +---------+---------------+---------+-----------+----------+-------+ FV DistalFull                                                 +---------+---------------+---------+-----------+----------+-------+ PFV      Full                                                 +---------+---------------+---------+-----------+----------+-------+ POP      Full           Yes      Yes                          +---------+---------------+---------+-----------+----------+-------+ PTV      Full                                                 +---------+---------------+---------+-----------+----------+-------+ PERO     Full                                                 +---------+---------------+---------+-----------+----------+-------+    Summary: Right: There is no evidence of deep vein thrombosis in the lower extremity. No cystic structure found in the popliteal fossa. Left: There is no evidence of deep vein thrombosis in the lower extremity. No cystic  structure found in the popliteal fossa.  *See table(s) above for measurements and observations. Electronically signed by Monica Martinez MD on 09/24/2018 at 3:52:06 PM.    Final      Antimicrobials:   none    Subjective: Patient continues to improve become more interactive.  Feeding tube in place, did fail speech eval for p.o. intake.  Son at bedside answered all questions  Objective: Vitals:   10/06/18 2317 10/07/18 0344 10/07/18 0731 10/07/18 1140  BP: (!) 149/57 (!) 143/55 (!) 163/69 (!) 146/56  Pulse: 73 69 78 77  Resp: 15 14 20 17   Temp: 97.6 F (36.4 C) 97.6 F (36.4 C) (!) 97.4 F (36.3 C) 97.6 F (36.4 C)  TempSrc: Axillary Axillary Axillary Axillary  SpO2: 97% 99%  98%  Weight:      Height:        Intake/Output Summary (Last 24 hours) at 10/07/2018 1225 Last data filed at 10/06/2018 2300 Gross per 24 hour  Intake 455.42 ml  Output -  Net 455.42 ml   [employer]  09/27/18 0500 09/28/18 0500 10/01/18 0500  Weight: 52.4 kg 52 kg 52.9 kg    Examination:  General exam: Appears calm and comfortable FT in place Respiratory system: Clear to auscultation. Respiratory effort normal. Cardiovascular system: S1 & S2 heard, RRR. No JVD, murmurs, rubs, gallops or clicks. No pedal edema. Gastrointestinal system: Abdomen is nondistended, soft and nontender. No organomegaly or masses felt. Normal bowel sounds heard. Central nervous system: Alert and oriented. No focal neurological deficits. Extremities: Symmetric 5 x 5 power. No edema Skin: No rashes, lesions or ulcers Psychiatry: Judgement and insight improving-son translating Mood & affect appropriate.  Significant improvement    Data Reviewed: I have personally reviewed following labs and imaging studies  CBC: Recent Labs  Lab 10/02/18 0815 10/04/18 1015 10/05/18 0627 10/07/18 0434  WBC 4.2 4.3 4.4 2.0*  NEUTROABS 2.8 2.8 3.0 1.8  HGB 8.4* 8.9* 9.1* 9.6*  HCT 26.6* 28.5* 29.0* 29.1*  MCV 95.3 96.9 95.1  94.5  PLT 205 184 190 086   Basic Metabolic Panel: Recent Labs  Lab 10/03/18 1619 10/04/18 1015 10/05/18 0627 10/06/18 0314 10/07/18 0434  NA 154* 150* 149* 148* 147*  K 3.4* 3.8 4.1 4.1 4.3  CL 126* 123* 123* 121* 123*  CO2 20* 17* 18* 18* 19*  GLUCOSE 155* 156* 111* 127* 162*  BUN 74* 69* 66* 61* 50*  CREATININE 2.55* 2.38* 2.34* 1.86* 1.62*  CALCIUM 8.0* 8.2* 8.2* 8.0* 8.2*  MG  --   --  2.0  --   --    GFR: Estimated Creatinine Clearance: 21.3 mL/min (A) (by C-G formula based on SCr of 1.62 mg/dL (H)). Liver Function Tests: Recent Labs  Lab 10/01/18 0948 10/04/18 1015  AST 43* 31  ALT 24 17  ALKPHOS 57 53  BILITOT 0.9 0.9  PROT 5.9* 5.6*  ALBUMIN 2.1* 1.9*   No results for input(s): LIPASE, AMYLASE in the last 168 hours. No results for input(s): AMMONIA in the last 168 hours. Coagulation Profile: Recent Labs  Lab 10/03/18 1215  INR 1.13   Cardiac Enzymes: No results for input(s): CKTOTAL, CKMB, CKMBINDEX, TROPONINI in the last 168 hours. BNP (last 3 results) No results for input(s): PROBNP in the last 8760 hours. HbA1C: No results for input(s): HGBA1C in the last 72 hours. CBG: No results for input(s): GLUCAP in the last 168 hours. Lipid Profile: No results for input(s): CHOL, HDL, LDLCALC, TRIG, CHOLHDL, LDLDIRECT in the last 72 hours. Thyroid Function Tests: No results for input(s): TSH, T4TOTAL, FREET4, T3FREE, THYROIDAB in the last 72 hours. Anemia Panel: No results for input(s): VITAMINB12, FOLATE, FERRITIN, TIBC, IRON, RETICCTPCT in the last 72 hours. Sepsis Labs: No results for input(s): PROCALCITON, LATICACIDVEN in the last 168 hours.  Recent Results (from the past 240 hour(s))  Respiratory Panel by PCR     Status: None   Collection Time: 09/29/18  2:22 PM  Result Value Ref Range Status   Adenovirus NOT DETECTED NOT DETECTED Final   Coronavirus 229E NOT DETECTED NOT DETECTED Final    Comment: (NOTE) The Coronavirus on the Respiratory  Panel, DOES NOT test for the novel  Coronavirus (2019 nCoV)    Coronavirus HKU1 NOT DETECTED NOT DETECTED Final   Coronavirus NL63 NOT DETECTED NOT DETECTED Final   Coronavirus OC43 NOT DETECTED NOT DETECTED Final   Metapneumovirus NOT DETECTED NOT DETECTED Final   Rhinovirus / Enterovirus NOT DETECTED NOT DETECTED Final   Influenza A NOT DETECTED NOT DETECTED Final   Influenza B NOT DETECTED  NOT DETECTED Final   Parainfluenza Virus 1 NOT DETECTED NOT DETECTED Final   Parainfluenza Virus 2 NOT DETECTED NOT DETECTED Final   Parainfluenza Virus 3 NOT DETECTED NOT DETECTED Final   Parainfluenza Virus 4 NOT DETECTED NOT DETECTED Final   Respiratory Syncytial Virus NOT DETECTED NOT DETECTED Final   Bordetella pertussis NOT DETECTED NOT DETECTED Final   Chlamydophila pneumoniae NOT DETECTED NOT DETECTED Final   Mycoplasma pneumoniae NOT DETECTED NOT DETECTED Final    Comment: Performed at [facility], [place] [address], [place], [place] [address]  Culture, blood (Routine X 2) w Reflex to ID Panel     Status: None   Collection Time: 09/30/18 11:36 AM  Result Value Ref Range Status   Specimen Description   Final    BLOOD LEFT HAND Performed at [facility] [address] [place], [place] [address]    Special Requests   Final    BOTTLES DRAWN AEROBIC AND ANAEROBIC Blood Culture adequate volume Performed at [facility] [address], [place], [place] [address]    Culture   Final    NO GROWTH 5 DAYS Performed at [facility], [place] [address], [place], [place] [address]    Report Status 10/05/2018 FINAL  Final  Culture, blood (Routine X 2) w Reflex to ID Panel     Status: None   Collection Time: 09/30/18 11:37 AM  Result Value Ref Range Status   Specimen Description   Final    BLOOD RIGHT HAND Performed at [facility] [address] [place], [place] [address]    Special Requests   Final    BOTTLES DRAWN AEROBIC  AND ANAEROBIC Blood Culture adequate volume Performed at [facility] [address], [place], [place] [address]    Culture   Final    NO GROWTH 5 DAYS Performed at [facility], [place] [address] [place], [place] [address]    Report Status 10/05/2018 FINAL  Final  Urine Culture     Status: None   Collection Time: 09/30/18 12:38 PM  Result Value Ref Range Status   Specimen Description   Final    URINE, RANDOM Performed at [facility] [address], [place], [place] [address]    Special Requests   Final    NONE Performed at [facility], [place] [address], [place], [place] [address]    Culture   Final    NO GROWTH Performed at [facility], [place] [address], [place],  [address]    Report Status 10/01/2018 FINAL  Final  CSF culture with Stat gram stain     Status: None   Collection Time: 10/01/18  9:55 AM  Result Value Ref Range Status   Specimen Description CSF  Final   Special Requests Normal  Final   Gram Stain   Final    CYTOSPIN SMEAR WBC PRESENT, PREDOMINANTLY MONONUCLEAR NO ORGANISMS SEEN    Culture   Final    NO GROWTH 3 DAYS Performed at [facility], [place] [address], [place],  [address]    Report Status 10/04/2018 FINAL  Final  Fungus Culture With Stain     Status: None (Preliminary result)   Collection Time: 10/01/18  9:55 AM  Result Value Ref Range Status   Fungus Stain Final report  Final    Comment: (NOTE) Performed At: [facility] [place], [place]  [id number] Rush Farmer MD DJ:[id number]    Fungus (Mycology) Culture PENDING  Incomplete   Fungal Source CSF  Final    Comment: Performed at [facility], [place] [address], [place], [place] [address]  Fungus Culture Result     Status: None   Collection Time: 10/01/18  9:55 AM  Result Value Ref Range Status   Result 1 Comment  Final    Comment: (NOTE) KOH/Calcofluor preparation:  no fungus  observed. Performed At: [facility] [place], [place] [id number] Rush Farmer MD JF:[id number]          Radiology Studies: Dg Chest Port 1 View  Result Date: 10/06/2018 CLINICAL DATA:  CHF EXAM: PORTABLE CHEST 1 VIEW COMPARISON:  Chest x-rays dated 10/04/2018, 09/30/2018 and 09/24/2018 FINDINGS: Stable cardiomegaly. Persistent central pulmonary vascular congestion but improved aeration of both lungs compared to the most recent study of 10/04/2018. Persistent small bilateral pleural effusions and probable associated atelectasis. Enteric tube passes below the diaphragm. IMPRESSION: 1. Improved aeration compared to the most recent chest x-ray of 10/04/2018, presumably resolving pulmonary edema related to improved fluid status. 2. Stable bibasilar opacities, likely a combination of small pleural effusions and atelectasis. Electronically Signed   By: Franki Cabot M.D.   On: 10/06/2018 20:11   Dg Abd Portable 1v  Result Date: 10/06/2018 CLINICAL DATA:  Evaluate feeding tube position. EXAM: PORTABLE ABDOMEN - 1 VIEW COMPARISON:  09/28/2018 FINDINGS: Feeding tube has been placed in the interval. This terminates at the antral/pyloric region. Contrast remains in normal caliber colon. Bibasilar airspace disease. No gross free intraperitoneal air IMPRESSION: Feeding tube terminating at the antral/pyloric region. Electronically Signed   By: Abigail Miyamoto M.D.   On: 10/06/2018 18:14        Scheduled Meds: . budesonide (PULMICORT) nebulizer solution  0.25 mg Nebulization BID  . chlorhexidine  15 mL Mouth Rinse BID  . cloNIDine  0.3 mg Transdermal Weekly  . [name] butt cream   Topical BID  . ipratropium-albuterol  3 mL Nebulization BID  . levothyroxine  37.5 mcg Intravenous Daily  . LORazepam  2 mg Intravenous Once  . mouth rinse  15 mL Mouth Rinse q12n4p  . metoprolol tartrate  10 mg Intravenous Q6H   Continuous Infusions: . dextrose 5 % with KCl 20 mEq / L 20 mEq  (10/07/18 1041)  . feeding supplement (OSMOLITE 1.2 CAL) 55 mL/hr at 10/06/18 2300     LOS: 13 days    Time spent: 35 min    Nicolette Bang, MD [facility]  If 7PM-7AM, please contact night-coverage  10/07/2018, 12:25 PM

## 2018-10-07 NOTE — Progress Notes (Signed)
[facility]    NEPHROLOGY PROGRESS NOTE  SUBJECTIVE: Cortrak placed yesterday and TF + FW initiated.  Ultimate plan unclear to me but is sounds like they are hopeful she will recover enough to avoid a PEG tube.  Son at bedside and interprets for me (S[place]i).  She doesn't speak back but does smile. No UOP doc in past 24h but foley contains ~430mL yellow urine.   OBJECTIVE:  Vitals:   10/07/18 0344 10/07/18 0731  BP: (!) 143/55 (!) 163/69  Pulse: 69 78  Resp: 14 20  Temp: 97.6 F (36.4 C) (!) 97.4 F (36.3 C)  SpO2: 99%     Intake/Output Summary (Last 24 hours) at 10/07/2018 1032 Last data filed at 10/06/2018 2300 Gross per 24 hour  Intake 455.42 ml  Output -  Net 455.42 ml      General: Eyes open, does not answer questions but smiles.  NAD HEENT: MMM [place] AT anicteric sclera Neck:  No JVD, no adenopathy CV:  Heart RRR  Lungs:  L/S coarse bilaterally Abd:  abd SNT/ND with normal BS GU:  Bladder non-palpable with foley draining Extremities: trace LE edema. Skin:  No skin rash  MEDICATIONS:  . budesonide (PULMICORT) nebulizer solution  0.25 mg Nebulization BID  . chlorhexidine  15 mL Mouth Rinse BID  . cloNIDine  0.3 mg Transdermal Weekly  . [name] butt cream   Topical BID  . ipratropium-albuterol  3 mL Nebulization BID  . levothyroxine  37.5 mcg Intravenous Daily  . LORazepam  2 mg Intravenous Once  . mouth rinse  15 mL Mouth Rinse q12n4p  . metoprolol tartrate  10 mg Intravenous Q6H       LABS:   CBC Latest Ref Rng & Units 10/07/2018 10/05/2018 10/04/2018  WBC 4.0 - 10.5 K/uL 2.0(L) 4.4 4.3  Hemoglobin 12.0 - 15.0 g/dL 9.6(L) 9.1(L) 8.9(L)  Hematocrit 36.0 - 46.0 % 29.1(L) 29.0(L) 28.5(L)  Platelets 150 - 400 K/uL 159 190 184    CMP Latest Ref Rng & Units 10/07/2018 10/06/2018 10/05/2018  Glucose 70 - 99 mg/dL 162(H) 127(H) 111(H)  BUN 8 - 23 mg/dL 50(H) 61(H) 66(H)  Creatinine 0.44 - 1.00 mg/dL 1.62(H) 1.86(H) 2.34(H)  Sodium 135 - 145 mmol/L  147(H) 148(H) 149(H)  Potassium 3.5 - 5.1 mmol/L 4.3 4.1 4.1  Chloride 98 - 111 mmol/L 123(H) 121(H) 123(H)  CO2 22 - 32 mmol/L 19(L) 18(L) 18(L)  Calcium 8.9 - 10.3 mg/dL 8.2(L) 8.0(L) 8.2(L)  Total Protein 6.5 - 8.1 g/dL - - -  Total Bilirubin 0.3 - 1.2 mg/dL - - -  Alkaline Phos 38 - 126 U/L - - -  AST 15 - 41 U/L - - -  ALT 0 - 44 U/L - - -    Lab Results  Component Value Date   CALCIUM 8.2 (L) 10/07/2018   CAION 1.19 08/07/2015   PHOS 4.6 09/29/2018       Component Value Date/Time   COLORURINE AMBER (A) 09/28/2018 0759   APPEARANCEUR CLOUDY (A) 09/28/2018 0759   LABSPEC 1.015 09/28/2018 0759   PHURINE 6.0 09/28/2018 0759   GLUCOSEU NEGATIVE 09/28/2018 0759   HGBUR LARGE (A) 09/28/2018 0759   BILIRUBINUR NEGATIVE 09/28/2018 0759   KETONESUR NEGATIVE 09/28/2018 0759   PROTEINUR 100 (A) 09/28/2018 0759   UROBILINOGEN 1.0 06/08/2015 1724   NITRITE NEGATIVE 09/28/2018 0759   LEUKOCYTESUR MODERATE (A) 09/28/2018 0759      Component Value Date/Time   PHART 7.371 09/27/2018 0902   PCO2ART  32.3 09/27/2018 0902   PO2ART 104 09/27/2018 0902   HCO3 18.2 (L) 09/27/2018 0902   TCO2 22 08/07/2015 2329   ACIDBASEDEF 5.8 (H) 09/27/2018 0902   O2SAT 97.8 09/27/2018 0902       Component Value Date/Time   IRON 144 09/24/2018 0605   TIBC 358 09/24/2018 0605   FERRITIN 139 09/24/2018 0605   IRONPCTSAT 40 (H) 09/24/2018 0605       ASSESSMENT/PLAN:       1.  Chronic kidney disease stage III with a baseline serum creatinine around 1.3-1.6.  This is likely on the basis of longstanding hypertension and atherosclerotic cardiovascular disease.  2.  Acute kidney injury.  This is likely on the basis of decreased renal perfusion in the setting of volume depletion.  Serum creatinine is improved to baseline today.  From the bag foley urine it appears UOP is adequate just not documented into chart.   3.  Hypernatremia.  Likely secondary to poor p.o. intake.  Continue to supplement free  water via cortrak.  Would follow serial sodiums and adjust volume.   4.  Hypertension.  She originally presented with hypertensive urgency, which has somewhat improved.  BPs improve in past 18hrs after hydralazine increased (IV) - 130s-140s generally.  Would resume oral meds via tube if able.  5.  Acute on chronic diastolic congestive heart failure.  Avoid sodium excess via tube feeds.   As she is back to baseline renal function nephrology will sign off for now.  Please let us know if we can be helpful with anything.  I discussed with son.   Jannifer Hick MD

## 2018-10-07 NOTE — Care Management Important Message (Signed)
Important Message  Patient Details  Name: SOUMYA COLSON MRN: [id number] Date of Birth: March 30, 1941   Medicare Important Message Given:  Yes    [name] 10/07/2018, 3:36 PM

## 2018-10-07 NOTE — Evaluation (Signed)
Occupational Therapy Evaluation Patient Details Name: Katie Mosley MRN: [id number] DOB: [birth year] Today's Date: 10/07/2018    History of Present Illness 78 yo female admitted to ED on 1/28 for weakness and fatigue, with medical diagnosis of sepsis with possible PNA. PMH includes adrenal insufficiency, hypothyroid, CHF, HTN, hypothyroidism, dyspnea, CKDIII.    Clinical Impression   Patient presenting with decreased I in self care, balance, functional mobility/transfer, cognition, communication, and safety awareness.  Patient lives with son and daughter in law and per chart pt performed all self care tasks independently PTA. She did however also have an aide 3 hours each day.  Patient currently functioning at total A of 2 for bed mobility. Pt inconsistent with one step commands and vision likely affected as well and responds better to tactile cuing.  Patient will benefit from acute OT to increase overall independence in the areas of ADLs, functional mobility, reduce caregiver burden in order to safely discharge to next venue of care. Pt is more alert today but continues to not be functional in abilities. If family not agreeable to SNF, pt may go home with family but will need training on caring for pt likely from bed level with 24/7 Supervision/Assist at discharge needed.     Follow Up Recommendations  SNF;Supervision/Assistance - 24 hour    Equipment Recommendations  Other (comment)(defer to next venue of care)    Recommendations for Other Services Other (comment)(none needed at this time)     Precautions / Restrictions Precautions Precautions: Fall Precaution Comments: pt is [place], son translates Restrictions Weight Bearing Restrictions: No      Mobility Bed Mobility Overal bed mobility: Needs Assistance Bed Mobility: Supine to Sit;Sit to Supine     Supine to sit: Total assist;+2 for physical assistance Sit to supine: Total assist;+2 for physical assistance       Transfers     General transfer comment: not performed secondary to safety concerns    Balance Overall balance assessment: Needs assistance Sitting-balance support: Bilateral upper extremity supported;Feet supported Sitting balance-Leahy Scale: Poor Sitting balance - Comments: balance with mod - total A Postural control: Posterior lean;Right lateral lean         ADL either performed or assessed with clinical judgement   ADL Overall ADL's : Needs assistance/impaired   Eating/Feeding Details (indicate cue type and reason): cortrack Grooming: Wash/dry hands;Wash/dry face;Oral care;Total assistance;Bed level   Upper Body Bathing: Total assistance;Bed level   Lower Body Bathing: +2 for physical assistance;Total assistance;Bed level   Upper Body Dressing : Total assistance   Lower Body Dressing: Total assistance;+2 for physical assistance;Bed level      General ADL Comments: pt needing total - +2 assistance for self care tasks from bed level secondary to safety     Vision Baseline Vision/History: No visual deficits Patient Visual Report: Other (comment)(unable to verbalize) Vision Assessment?: Vision impaired- to be further tested in functional context            Pertinent Vitals/Pain Pain Assessment: Faces Faces Pain Scale: No hurt Pain Intervention(s): Monitored during session     Hand Dominance Right   Extremity/Trunk Assessment Upper Extremity Assessment Upper Extremity Assessment: Generalized weakness;Difficult to assess due to impaired cognition   Lower Extremity Assessment Lower Extremity Assessment: Defer to PT evaluation   Cervical / Trunk Assessment Cervical / Trunk Assessment: Kyphotic   Communication Communication Communication: Prefers language other than English   Cognition Arousal/Alertness: Awake/alert Behavior During Therapy: Flat affect Overall Cognitive Status: Impaired/Different from baseline Area of  Impairment:  Orientation;Attention;Memory;Safety/judgement;Following commands;Problem solving;Awareness        Following Commands: Follows one step commands inconsistently Safety/Judgement: Decreased awareness of safety;Decreased awareness of deficits   Problem Solving: Requires verbal cues;Requires tactile cues;Difficulty sequencing;Decreased initiation;Slow processing General Comments: did seem to respond better to tactile quing and likely experiencing visual deficits              Home Living Family/patient expects to be discharged to:: Private residence Living Arrangements: Children(son and daughter in law) Available Help at Discharge: Family;Available 24 hours/day(per chart daughter in law available 24/7 and aide comes 3 hrs/daily for pt) Type of Home: House Home Access: Stairs to enter [employer] of Steps: 5 Entrance Stairs-Rails: Right;Left;Can reach both Home Layout: One level     Bathroom Shower/Tub: Teacher, early years/pre: Standard     Home Equipment: Environmental consultant - 2 wheels          Prior Functioning/Environment Level of Independence: Needs assistance  Gait / Transfers Assistance Needed: Per pt's son, pt was independent with mobility and did not need AD or physical assist to perform mobility tasks, no history of falls. ADL's / Homemaking Assistance Needed: Pt with personal care aide 3 hours/day to assist with bathing and cleaning pt's room, but pt's son also states she was independent with bathing/dressing             OT Problem List: Decreased strength;Impaired balance (sitting and/or standing);Decreased cognition;Decreased knowledge of precautions;Pain;Impaired vision/perception;Decreased safety awareness;Decreased activity tolerance;Decreased coordination;Decreased knowledge of use of DME or AE;Impaired sensation;Impaired UE functional use      OT Treatment/Interventions: Self-care/ADL training;Manual therapy;Therapeutic exercise;Patient/family  education;Neuromuscular education;Balance training;Energy conservation;Therapeutic activities;DME and/or AE instruction;Cognitive remediation/compensation    OT Goals(Current goals can be found in the care plan section) Acute Rehab OT Goals Patient Stated Goal: to reduce caregiver burden OT Goal Formulation: With family Time For Goal Achievement: 10/21/18 Potential to Achieve Goals: Good ADL Goals Pt Will Perform Grooming: with mod assist Pt Will Perform Upper Body Bathing: with mod assist Pt Will Perform Lower Body Bathing: with max assist;bed level Pt Will Perform Upper Body Dressing: with mod assist Pt Will Perform Lower Body Dressing: with max assist  OT Frequency: Min 2X/week   Barriers to D/C: Other (comment)  pt will need total A from bed level       Co-evaluation PT/OT/SLP Co-Evaluation/Treatment: Yes Reason for Co-Treatment: Complexity of the patient's impairments (multi-system involvement);Necessary to address cognition/behavior during functional activity;For patient/therapist safety   OT goals addressed during session: ADL's and self-care;Other (comment)(balance/mobility)      AM-PAC OT "6 Clicks" Daily Activity     Outcome Measure Help from another person eating meals?: Total Help from another person taking care of personal grooming?: Total Help from another person toileting, which includes using toliet, bedpan, or urinal?: Total Help from another person bathing (including washing, rinsing, drying)?: Total Help from another person to put on and taking off regular upper body clothing?: Total Help from another person to put on and taking off regular lower body clothing?: Total 6 Click Score: 6   End of Session    Activity Tolerance: Patient limited by fatigue Patient left: in bed;with call bell/phone within reach;with bed alarm set;with family/visitor present;with restraints reapplied  OT Visit Diagnosis: Muscle weakness (generalized) (M62.81)                 Time: 7353-2992 OT Time Calculation (min): 30 min Charges:  OT General Charges $OT Visit: 1 Visit OT Evaluation $OT Eval  High Complexity: 1 High  Jenna Ardoin P, MS, OTR/L 10/07/2018, 3:55 PM

## 2018-10-08 DIAGNOSIS — Z515 Encounter for palliative care: Secondary | ICD-10-CM

## 2018-10-08 DIAGNOSIS — E870 Hyperosmolality and hypernatremia: Secondary | ICD-10-CM

## 2018-10-08 DIAGNOSIS — R627 Adult failure to thrive: Secondary | ICD-10-CM

## 2018-10-08 LAB — BASIC METABOLIC PANEL
Anion gap: 6 (ref 5–15)
BUN: 49 mg/dL — ABNORMAL HIGH (ref 8–23)
CALCIUM: 7.8 mg/dL — AB (ref 8.9–10.3)
CO2: 19 mmol/L — ABNORMAL LOW (ref 22–32)
CREATININE: 1.38 mg/dL — AB (ref 0.44–1.00)
Chloride: 119 mmol/L — ABNORMAL HIGH (ref 98–111)
GFR calc Af Amer: 42 mL/min — ABNORMAL LOW (ref >60)
GFR calc non Af Amer: 37 mL/min — ABNORMAL LOW (ref >60)
Glucose, Bld: 160 mg/dL — ABNORMAL HIGH (ref 70–99)
Potassium: 4.5 mmol/L (ref 3.5–5.1)
Sodium: 144 mmol/L (ref 135–145)

## 2018-10-08 LAB — GLUCOSE, CAPILLARY
GLUCOSE-CAPILLARY: 144 mg/dL — AB (ref 70–99)
Glucose-Capillary: 133 mg/dL — ABNORMAL HIGH (ref 70–99)
Glucose-Capillary: 139 mg/dL — ABNORMAL HIGH (ref 70–99)
Glucose-Capillary: 144 mg/dL — ABNORMAL HIGH (ref 70–99)
Glucose-Capillary: 150 mg/dL — ABNORMAL HIGH (ref 70–99)
Glucose-Capillary: 162 mg/dL — ABNORMAL HIGH (ref 70–99)

## 2018-10-08 MED ORDER — ENOXAPARIN SODIUM 40 MG/0.4ML ~~LOC~~ SOLN
40.0000 mg | SUBCUTANEOUS | Status: DC
  Administered 2018-10-08: 40 mg via SUBCUTANEOUS
  Filled 2018-10-08: qty 0.4

## 2018-10-08 MED ORDER — METOPROLOL TARTRATE 25 MG/10 ML ORAL SUSPENSION
25.0000 mg | Freq: Two times a day (BID) | ORAL | Status: DC
  Administered 2018-10-08 – 2018-10-17 (×17): 25 mg
  Filled 2018-10-08 (×19): qty 10

## 2018-10-08 MED ORDER — FREE WATER
250.0000 mL | Freq: Four times a day (QID) | Status: DC
  Administered 2018-10-08 – 2018-10-14 (×23): 250 mL

## 2018-10-08 MED ORDER — LEVOTHYROXINE SODIUM 75 MCG PO TABS
75.0000 ug | ORAL_TABLET | Freq: Every day | ORAL | Status: DC
  Administered 2018-10-09 – 2018-10-17 (×8): 75 ug via NASOGASTRIC
  Filled 2018-10-08 (×9): qty 1

## 2018-10-08 MED ORDER — ENOXAPARIN SODIUM 30 MG/0.3ML ~~LOC~~ SOLN
30.0000 mg | SUBCUTANEOUS | Status: DC
  Administered 2018-10-09 – 2018-10-11 (×3): 30 mg via SUBCUTANEOUS
  Filled 2018-10-08 (×3): qty 0.3

## 2018-10-08 NOTE — Progress Notes (Signed)
SLP Cancellation Note  Patient Details Name: Katie Mosley MRN: [id number] DOB: 10-Aug-1941   Cancelled treatment:       Reason Eval/Treat Not Completed: Patient at procedure or test/unavailable. Pt with other therapy at this time. SLP will follow up.    Horton Marshall 10/08/2018, 5:14 PM

## 2018-10-08 NOTE — Progress Notes (Signed)
Patient ID: Katie Mosley, female   DOB: Jan 16, 1941, 78 y.o.   MRN: [id number]  This NP visited patient at the bedside as a follow up for palliative medicine needs and emotional support. Son at bedside   Patient is more awake and follows commands from son.                Patient's is very pleased with what he sees as great progress, family remains hopeful for continued improvemetn.  Patient with multiple comorbidities, and high risk for decompensation.  Overall failure to thrive.  Currently with Cortrak for nutritional support and tolerating well.   Will likely be ongoing discussion with family addressing long-term nutritional needs.  We discussed the possibility that sometimes medicine never can find a specific root cause for these kinds of situations.  Sometimes the body simply begins to fail to thrive within the context of human mortality.  Discussed the mutations of medical interventions within the context of human mortality and failure to thrive.  Discussed the difference between an aggressive medical intervention path and a palliative comfort path for this patient at this time in this situation. Family are open to all offered and available medical interventions to prolong life and are hopeful for improvement.  Discussed with family  the importance of continued conversation with his family and the medical providers regarding overall plan of care and treatment options,  ensuring decisions are within the context of the patients values and GOCs.  Questions and concerns addressed   PMT will continue to support holistically  Total time spent on the unit was 20 minutes       Greater than 50% of the time was spent in counseling and coordination of care  Wadie Lessen NP  Palliative Medicine Team Team Phone # [phone] Pager [phone]

## 2018-10-08 NOTE — Progress Notes (Signed)
PROGRESS NOTE    Katie Mosley  QPR:[id number] DOB: 05-08-1941 DOA: 09/23/2018 PCP: Nolene Ebbs, MD    Brief Narrative:  78 year old female who presented with dyspnea and weakness.  She does have significant past medical history for diastolic heart failure, chronic hyponatremia, chronic kidney disease stage III, chronic anemia, hypothyroidism and hypertension.  Patient reported fatigue and dyspnea for about 7 days prior to hospitalization, associated with edema of her lower extremities, easy fatigability and generalized weakness. On her nitial physical examination blood pressure 191/67, heart rate 57, respiratory rate 13, oxygen saturation 98%.  Her lungs had mild rales bilaterally, heart S1-S2 present and rhythmic, abdomen soft nontender, positive lower extremity edema.  Chest x-ray had vascular congestion.  EKG normal sinus rhythm, normal axis normal intervals.  Patient was admitted to the hospital working diagnosis acute on chronic diastolic heart failure.   09/27/2018. Patient developed worsening encephalopathy and was transferred to the intensive care unit.  She was nonverbal, very confused and unable to answer questions.  10/01/2018.  Transferred to [facility] stepdown unit for further neurological work-up.  Encephalopathy likely multifactorial.  Clinically improving, she continued to have swallow dysfunction, she is n.p.o., family had declined PEG tube.   Assessment & Plan:   Principal Problem:   Sepsis ([place]) Active Problems:   Hyponatremia   Hypothyroidism   Hypothermia   Acute encephalopathy   Normocytic anemia   Leukopenia   CKD (chronic kidney disease), stage IV ([place])   DNR (do not resuscitate) discussion   1. Acute encephalopathy, toxic combined metabolic. Patient is more awake and reactive, still not back to baseline, no consistent comprehensive speech per her son at the bedside. Continue to be NPO per speech therapy. Will continue supportive medical therapy, patient  will need SNF. I explained the patient's son, the complications related to prolonged feedings per NG and the benefits from PEG tube. He will discuss this with the rest of the family and will come to a decision. Patient is very weak and deconditioned, will need SNF.   2. Right sided pneumonia. (present on admission). Patient has completed 5 days of antibiotic therapy. Continue aspiration precautions.   3. AKI on CKD stage 3, complicated with Hypernatremia. Na today at 144, with Cl at 119 and serum bicarbonate at 19 with non anion gap metabolic acidosis. Will continue free water flushes per Ng, and follow electrolytes in am. Renal function with serum cr down to 1,38.   5. Hypertension (urgency). Stable blood pressure with systolic 993, continue blood pressure control with clonidine patch. IV metoprolol will be dc for now.   6. Acute on chronic diastolic heart failure decompensation. Patient clinically euvolemic, will continue to hold on diuretic therapy for now. Will resume enteral metoprolol 25 mg bid  7. Hypothyroid. Will continue levothyroxine per ng tube.    DVT prophylaxis: enoxaparin   Code Status:  full Family Communication: I spoke with patient's son at the bedside and all questions were addressed.  Disposition Plan/ discharge barriers:  Pending snf placement, final decision on PEG tube per patient's family.   Body mass index is 23.56 kg/m. Malnutrition Type:  Nutrition Problem: Inadequate oral intake Etiology: inability to eat   Malnutrition Characteristics:  Signs/Symptoms: NPO status   Nutrition Interventions:  Interventions: Refer to RD note for recommendations  RN Pressure Injury Documentation:     Consultants:   Neurology   Procedures:     Antimicrobials:       Subjective: Patient continue to be poorly reactive, able  to communicate with her son, but continue to have persistent incomprehensive speech. Continue to be NPO due to aspiration risk.    Objective: Vitals:   10/08/18 0440 10/08/18 0728 10/08/18 0817 10/08/18 1207  BP: (!) 123/52 (!) 137/56  (!) 114/94  Pulse: 75     Resp: 17 17    Temp: 97.6 F (36.4 C) 98.9 F (37.2 C)  98.9 F (37.2 C)  TempSrc: Oral Axillary  Axillary  SpO2: 100% 99% 100% 98%  Weight:      Height:        Intake/Output Summary (Last 24 hours) at 10/08/2018 1354 Last data filed at 10/08/2018 0300 Gross per 24 hour  Intake 1257.81 ml  Output -  Net 1257.81 ml   Filed Weights   09/27/18 0500 09/28/18 0500 10/01/18 0500  Weight: 52.4 kg 52 kg 52.9 kg    Examination:   General: deconditioned and ill looking appearing  Neurology: Awake and alert, non focal  E ENT: no pallor, no icterus, oral mucosa moist/ NG tube in place.  Cardiovascular: No JVD. S1-S2 present, rhythmic, no gallops, rubs, or murmurs. No lower extremity edema. Pulmonary: positive breath sounds bilaterally, decreased air movement due to poor inspiratory effort, no wheezing, rhonchi or rales. Gastrointestinal. Abdomen with no organomegaly, non tender, no rebound or guarding Skin. No rashes Musculoskeletal: no joint deformities     Data Reviewed: I have personally reviewed following labs and imaging studies  CBC: Recent Labs  Lab 10/02/18 0815 10/04/18 1015 10/05/18 0627 10/07/18 0434  WBC 4.2 4.3 4.4 2.0*  NEUTROABS 2.8 2.8 3.0 1.8  HGB 8.4* 8.9* 9.1* 9.6*  HCT 26.6* 28.5* 29.0* 29.1*  MCV 95.3 96.9 95.1 94.5  PLT 205 184 190 161   Basic Metabolic Panel: Recent Labs  Lab 10/04/18 1015 10/05/18 0627 10/06/18 0314 10/07/18 0434 10/08/18 0454  NA 150* 149* 148* 147* 144  K 3.8 4.1 4.1 4.3 4.5  CL 123* 123* 121* 123* 119*  CO2 17* 18* 18* 19* 19*  GLUCOSE 156* 111* 127* 162* 160*  BUN 69* 66* 61* 50* 49*  CREATININE 2.38* 2.34* 1.86* 1.62* 1.38*  CALCIUM 8.2* 8.2* 8.0* 8.2* 7.8*  MG  --  2.0  --   --   --    GFR: Estimated Creatinine Clearance: 25 mL/min (A) (by C-G formula based on SCr of 1.38  mg/dL (H)). Liver Function Tests: Recent Labs  Lab 10/04/18 1015  AST 31  ALT 17  ALKPHOS 53  BILITOT 0.9  PROT 5.6*  ALBUMIN 1.9*   No results for input(s): LIPASE, AMYLASE in the last 168 hours. No results for input(s): AMMONIA in the last 168 hours. Coagulation Profile: Recent Labs  Lab 10/03/18 1215  INR 1.13   Cardiac Enzymes: No results for input(s): CKTOTAL, CKMB, CKMBINDEX, TROPONINI in the last 168 hours. BNP (last 3 results) No results for input(s): PROBNP in the last 8760 hours. HbA1C: No results for input(s): HGBA1C in the last 72 hours. CBG: Recent Labs  Lab 10/07/18 1545 10/07/18 1933 10/08/18 0043 10/08/18 0726 10/08/18 1121  GLUCAP 162* 125* 139* 150* 162*   Lipid Profile: No results for input(s): CHOL, HDL, LDLCALC, TRIG, CHOLHDL, LDLDIRECT in the last 72 hours. Thyroid Function Tests: No results for input(s): TSH, T4TOTAL, FREET4, T3FREE, THYROIDAB in the last 72 hours. Anemia Panel: No results for input(s): VITAMINB12, FOLATE, FERRITIN, TIBC, IRON, RETICCTPCT in the last 72 hours.    Radiology Studies: I have reviewed all of the imaging during this hospital  visit personally     Scheduled Meds: . budesonide (PULMICORT) nebulizer solution  0.25 mg Nebulization BID  . chlorhexidine  15 mL Mouth Rinse BID  . cloNIDine  0.3 mg Transdermal Weekly  . [name] butt cream   Topical BID  . ipratropium-albuterol  3 mL Nebulization BID  . levothyroxine  37.5 mcg Intravenous Daily  . LORazepam  2 mg Intravenous Once  . mouth rinse  15 mL Mouth Rinse q12n4p  . metoprolol tartrate  10 mg Intravenous Q6H   Continuous Infusions: . dextrose 5 % with KCl 20 mEq / L 50 mL/hr at 10/08/18 0300  . feeding supplement (OSMOLITE 1.2 CAL) 1,000 mL (10/08/18 0455)     LOS: 14 days         Gerome Apley, MD

## 2018-10-08 NOTE — Progress Notes (Signed)
Occupational Therapy Treatment Patient Details Name: Katie Mosley MRN: [id number] DOB: February 02, 1941 Today's Date: 10/08/2018    History of present illness 78 yo female admitted to ED on 1/28 for weakness and fatigue, with medical diagnosis of sepsis with possible PNA. PMH includes adrenal insufficiency, hypothyroid, CHF, HTN, hypothyroidism, dyspnea, CKDIII.    OT comments  Patient cooperative.  Remains total assist for bed mobility, but able to sustain sitting EOB with min assist initially fading to min guard to engage in self care tasks.  Limited participation in self care, but with hand over hand support able to wash hands and apply lotion (difficult to determine if patient not wanting to engage in tasks or not comprehending commands).  Sit to stand x 2 with max to total assist +2 for peri care and changing pads.  Patient following commands inconsistently throughout session. With increased mobility, patient verbalized wanting to sit up for longer but due to safety assisted to supine.  Supine in bed upon exit with HOB elevated and patient comfortable.  Family present, translated throughout session.    Follow Up Recommendations  SNF;Supervision/Assistance - 24 hour    Equipment Recommendations  Other (comment)(TBD at next venue of care)    Recommendations for Other Services      Precautions / Restrictions Precautions Precautions: Fall Precaution Comments: pt is [place], family translates Required Braces or Orthoses: Other Brace Other Brace: L PRAFO Restrictions Weight Bearing Restrictions: No       Mobility Bed Mobility Overal bed mobility: Needs Assistance Bed Mobility: Supine to Sit;Sit to Supine     Supine to sit: Total assist;+2 for physical assistance Sit to supine: Total assist;+2 for physical assistance   General bed mobility comments: total assist using bed pads to transition to/from EOB, pt not assisting or resisting movement   Transfers Overall transfer level:  Needs assistance Equipment used: None Transfers: Sit to/from Stand Sit to Stand: +2 physical assistance;+2 safety/equipment;Total assist         General transfer comment: +2 total assist to transition to standing x 2 trials for peri care and changing pads; patient requires able to activate quads in standing when cued to stand erect    Balance Overall balance assessment: Needs assistance Sitting-balance support: No upper extremity supported;Feet supported Sitting balance-Leahy Scale: Fair Sitting balance - Comments: static sitting EOB with min assist to min guard for safety; posterior lean with increased fatigue    Standing balance support: Bilateral upper extremity supported;During functional activity Standing balance-Leahy Scale: Poor Standing balance comment: sit to stand x 2 with therapist and tech- requires total assist and cueing to maintain erect position to change pads and complete peri care                            ADL either performed or assessed with clinical judgement   ADL Overall ADL's : Needs assistance/impaired   Eating/Feeding Details (indicate cue type and reason): cortrack Grooming: Wash/dry hands;Sitting;Moderate assistance(applying lotion to hands )                     Toilet Transfer Details (indicate cue type and reason): deferred due to safety         Functional mobility during ADLs: Total assistance;+2 for physical assistance General ADL Comments: continues to be limited by cognition and generalized weakness, fatigues easily sitting EOB     Vision       Perception     Praxis  Cognition Arousal/Alertness: Awake/alert Behavior During Therapy: Flat affect Overall Cognitive Status: Impaired/Different from baseline Area of Impairment: Following commands;Problem solving                       Following Commands: Follows one step commands inconsistently;Follows one step commands with increased time     Problem  Solving: Requires tactile cues;Difficulty sequencing;Decreased initiation;Slow processing;Requires verbal cues General Comments: difficult to assess due to language barrier, but following some commands during grooming tasks; family provided interpretation- she responds and smiles throughout session        Exercises     Shoulder Instructions       General Comments      Pertinent Vitals/ Pain       Pain Assessment: Faces Faces Pain Scale: Hurts a little bit Pain Location: back Pain Descriptors / Indicators: Discomfort Pain Intervention(s): Monitored during session;Repositioned  Home Living                                          Prior Functioning/Environment              Frequency  Min 2X/week        Progress Toward Goals  OT Goals(current goals can now be found in the care plan section)  Progress towards OT goals: Progressing toward goals  Acute Rehab OT Goals Patient Stated Goal: to get to the chair  OT Goal Formulation: With patient/family Time For Goal Achievement: 10/21/18 Potential to Achieve Goals: Good  Plan Discharge plan remains appropriate;Frequency remains appropriate    Co-evaluation                 AM-PAC OT "6 Clicks" Daily Activity     Outcome Measure   Help from another person eating meals?: Total Help from another person taking care of personal grooming?: A Lot Help from another person toileting, which includes using toliet, bedpan, or urinal?: Total Help from another person bathing (including washing, rinsing, drying)?: Total Help from another person to put on and taking off regular upper body clothing?: Total Help from another person to put on and taking off regular lower body clothing?: Total 6 Click Score: 7    End of Session    OT Visit Diagnosis: Muscle weakness (generalized) (M62.81)   Activity Tolerance Patient tolerated treatment well   Patient Left in bed;with call bell/phone within reach;with  family/visitor present   Nurse Communication Mobility status        Time: 1701-1730 OT Time Calculation (min): 29 min  Charges: OT General Charges $OT Visit: 1 Visit OT Treatments $Self Care/Home Management : 8-22 mins $Therapeutic Activity: 8-22 mins  Delight Stare, OT Acute Rehabilitation Services Pager [phone] Office [phone]    Delight Stare 10/08/2018, 5:42 PM

## 2018-10-08 NOTE — Progress Notes (Signed)
Nutrition Follow-up  INTERVENTION:  Continue advancing tube feeding via Cortrak NGT using Osmolite 1.2 formula by 10 ml every 8 hours to goal rate of 55 ml/hr.   Tube feeding regimen to provide 1584 kcal, 73 grams of protein, and 1082 ml water.  Monitor magnesium, potassium, and phosphorus daily for at least 3 days, MD to replete as needed, as pt is at risk for refeeding syndrome given prolonged poor po intake/NPO for 1 week.  NUTRITION DIAGNOSIS:   Inadequate oral intake related to inability to eat as evidenced by NPO status; ongoing  GOAL:   Patient will meet greater than or equal to 90% of their needs; progressing  MONITOR:   Weight trends, Labs, I & O's, Other (Comment)(plan for route of nutrition (oral vs enteral))  REASON FOR ASSESSMENT:   Consult Assessment of nutrition requirement/status, Enteral/tube feeding initiation and management  ASSESSMENT:   78 y.o. female with history of CHF with grade 1 diastolic dysfunction, chronic hyponatremia, CKD stage III, chronic anemia, hypothyroidism, and HTN. She presented to the ED with complaints of fatigue and SOB for the past 1 week. Patient also noticed increasing swelling in the lower extremities. Has had some productive cough.  Patient also has been feeling weak and fatigued easily.  Cortrak NGT placed 2/10.  Pt has been tolerating her tube feeds well. Rate at 40 ml/hr during time of visit. Slow advancement of tube feeding ongoing q 8 hours to aid in prevention of refeeding syndrome. Monitor magnesium, potassium, and phosphorus daily for at least 3 days, MD to replete as needed, as pt is at risk for refeeding syndrome given prolonged poor po intake/NPO for 1 week. Goal of care discussion regarding PEG tube ongoing with family. Per Palliative, family open to medical interventions to prolong pt life.  Labs and medications reviewed.   Diet Order:   Diet Order            Diet NPO time specified  Diet effective now               EDUCATION NEEDS:   Not appropriate for education at this time  Skin:  Skin Assessment: Reviewed RN Assessment  Last BM:  2/12  Height:   Ht Readings from Last 1 Encounters:  09/23/18 4\' 11"  (1.499 m)    Weight:   Wt Readings from Last 1 Encounters:  10/01/18 52.9 kg    Ideal Body Weight:  42.91 kg  BMI:  Body mass index is 23.56 kg/m.  Estimated Nutritional Needs:   Kcal:  1560-1820 kcal  Protein:  65-75 grams  Fluid:  >/= 1.6 L/day    Corrin Parker, MS, RD, LDN Pager # [phone] After hours/ weekend pager # [phone]

## 2018-10-09 DIAGNOSIS — R627 Adult failure to thrive: Secondary | ICD-10-CM

## 2018-10-09 DIAGNOSIS — G9340 Encephalopathy, unspecified: Secondary | ICD-10-CM

## 2018-10-09 LAB — BASIC METABOLIC PANEL
ANION GAP: 7 (ref 5–15)
BUN: 43 mg/dL — ABNORMAL HIGH (ref 8–23)
CO2: 19 mmol/L — ABNORMAL LOW (ref 22–32)
CREATININE: 1.3 mg/dL — AB (ref 0.44–1.00)
Calcium: 7.8 mg/dL — ABNORMAL LOW (ref 8.9–10.3)
Chloride: 114 mmol/L — ABNORMAL HIGH (ref 98–111)
GFR calc Af Amer: 46 mL/min — ABNORMAL LOW (ref >60)
GFR calc non Af Amer: 39 mL/min — ABNORMAL LOW (ref >60)
Glucose, Bld: 100 mg/dL — ABNORMAL HIGH (ref 70–99)
Potassium: 5.1 mmol/L (ref 3.5–5.1)
Sodium: 140 mmol/L (ref 135–145)

## 2018-10-09 LAB — GLUCOSE, CAPILLARY
Glucose-Capillary: 138 mg/dL — ABNORMAL HIGH (ref 70–99)
Glucose-Capillary: 118 mg/dL — ABNORMAL HIGH (ref 70–99)
Glucose-Capillary: 130 mg/dL — ABNORMAL HIGH (ref 70–99)
Glucose-Capillary: 124 mg/dL — ABNORMAL HIGH (ref 70–99)

## 2018-10-09 NOTE — Progress Notes (Addendum)
Daily Progress Note   Patient Name: Katie Mosley       Date: 10/09/2018 DOB: 07-Apr-1941  Age: 78 y.o. MRN#: [id number] Attending Physician: Tawni Millers Primary Care Physician: Nolene Ebbs, MD Admit Date: 09/23/2018  Reason for Consultation/Follow-up: Establishing goals of care  Subjective: 78 year old female, resting comfortably in bed. Sleeping on and off, cortrak in place   Assessment: Spoke with patient's son at bedside. He continues to be very hopeful for improvement back to baseline. He speaks of his Mother's progress over the past few days. He reports that she stood with help and a walker yesterday.   We specifically discussed her swallowing. He is hopeful to see improvement over the next 3 days with speech evaluation. He has not made a decision on a PEG tube yet. He would like to wait and see if she improves at all over the next 3 days. He understands the risks of the cortrak and that it is NOT a long term solution.   We discussed what life looks outside of the [facility] for Katie Mosley. Her son is hopeful to take her home with home health and would prefer that over SNF placement. We introduced the idea of hospice at home, however patient's son would like to take her home with home health first to see if she will improve with therapy before trying hospice.   We will continue to follow and encourage ongoing discussion about disposition and PEG vs careful hand feeding.   Patient Profile/HPI: 78 y.o. female who speaks somalian with past medical history of hypothyroidism, leukopenia, grade 1 diastolic heart failure, and CKD stg 3 who was admitted on 09/23/2018 with lethargy and an inability to swallow.  CT head was negative for acute stroke. Patient has continued to be  encephalopathic with unknown etiology throughout hospitalization. Continued deconditioning.   Length of Stay: 15  Current Medications: Scheduled Meds:  . budesonide (PULMICORT) nebulizer solution  0.25 mg Nebulization BID  . chlorhexidine  15 mL Mouth Rinse BID  . cloNIDine  0.3 mg Transdermal Weekly  . enoxaparin (LOVENOX) injection  30 mg Subcutaneous Q24H  . free water  250 mL Per Tube Q6H  . [name] butt cream   Topical BID  . ipratropium-albuterol  3 mL Nebulization BID  . levothyroxine  75 mcg Per NG tube  [id number]  . LORazepam  2 mg Intravenous Once  . mouth rinse  15 mL Mouth Rinse q12n4p  . metoprolol tartrate  25 mg Per Tube BID    Continuous Infusions: . feeding supplement (OSMOLITE 1.2 CAL) 1,000 mL (10/08/18 0455)    PRN Meds: acetaminophen **OR** acetaminophen, glycopyrrolate, hydrALAZINE  Physical Exam     General: elderly female, resting comfortably in bed, cortrak in place Lungs: bilateral rhonchi Heart: regular rate and rhythm, no m/r/g Abdomen: soft, non distended, + bowel sounds Extremities: wound on left heel  Vital Signs: BP (!) 159/64 (BP Location: Left Arm)   Pulse 77   Temp 97.7 F (36.5 C) (Axillary)   Resp 18   Ht 4\' 11"  (1.499 m)   Wt 54 kg   SpO2 100%   BMI 24.04 kg/m  SpO2: SpO2: 100 % O2 Device: O2 Device: Room Air O2 Flow Rate: O2 Flow Rate (L/min): 1 L/min  Intake/output summary:   Intake/Output Summary (Last 24 hours) at 10/09/2018 1001 Last data filed at 10/09/2018 0755 Gross per 24 hour  Intake 1736.33 ml  Output -  Net 1736.33 ml   LBM: Last BM Date: 10/08/18 Baseline Weight: Weight: 53.5 kg Most recent weight: Weight: 54 kg       Palliative Assessment/Data: 20%    Flowsheet Rows     Most Recent Value  Intake Tab  Referral Department  Hospitalist  Unit at Time of Referral  ER  Palliative Care Primary Diagnosis  Cardiac  Date Notified  09/24/18  Palliative Care Type  New Palliative care  Reason for referral   Clarify Goals of Care, Psychosocial or Spiritual support  Date of Admission  09/23/18  Date first seen by Palliative Care  09/24/18  # of days Palliative referral response time  0 Day(s)  # of days IP prior to Palliative referral  1  Clinical Assessment  Psychosocial & Spiritual Assessment  Palliative Care Outcomes      Patient Active Problem List   Diagnosis Date Noted  . Hypernatremia 10/08/2018  . Adult failure to thrive   . Palliative care by specialist   . Sepsis ([place]) 09/25/2018  . DNR (do not resuscitate) discussion   . Dysphagia   . Normocytic anemia 09/07/2018  . Leukopenia 09/07/2018  . CKD (chronic kidney disease), stage IV (Katie [facility]) 09/07/2018  . Constipation   . Acute encephalopathy   . Altered mental status 08/08/2015  . Secondary adrenal insufficiency (Katie Mosley) 08/08/2015  . AKI (acute kidney injury) (Katie Mosley) 08/08/2015  . Hypothermia 02/15/2015  . Pulmonary hypertension (Katie Mosley) 02/14/2015  . Elevated TSH 02/14/2015  . Hyperkalemia 02/14/2015  . Pleuritic chest pain 02/13/2015  . GERD (gastroesophageal reflux disease)   . Asthma   . Congestive heart disease (Katie Mosley)   . Asthma, mild intermittent   . Ischemic colitis (Katie Mosley) 10/31/2014  . Hyponatremia 10/31/2014  . Benign essential HTN 10/31/2014  . Hypothyroidism 10/31/2014    Palliative Care Plan    Recommendations/Plan:  Continue full scope/aggressive treatment  Son is hopeful for further recovery  Continue to reassess swallow function over the next 3 days. Then, son will make decision about PEG  We will continue to follow for support  Goals of Care and Additional Recommendations:  Limitations on Scope of Treatment: Full Scope Treatment  Code Status:  Full code  Prognosis:   < 6 months , given persistent encephalopathy, severe deconditioning, and inability to swallow.   Discharge Planning:  To Be Determined  Care plan was discussed with  patient's son  Thank you for allowing the Palliative Medicine  Team to assist in the care of this patient.  Total time spent:  25 minutes     Greater than 50%  of this time was spent counseling and coordinating care related to the above assessment and plan.   Ferrel Logan, PA-S 10/09/2018  Florentina Jenny, PA-C Palliative Medicine  Please contact Palliative MedicineTeam phone at [phone] for questions and concerns between 7 am - 7 pm.   Please see AMION for individual provider pager numbers.

## 2018-10-09 NOTE — Progress Notes (Signed)
PROGRESS NOTE    Katie Mosley  IDP:[id number] DOB: 09/16/1940 DOA: 09/23/2018 PCP: Nolene Ebbs, MD    Brief Narrative:  78 year old female who presented with dyspnea and weakness.  She does have significant past medical history for diastolic heart failure, chronic hyponatremia, chronic kidney disease stage III, chronic anemia, hypothyroidism and hypertension.  Patient reported fatigue and dyspnea for about 7 days prior to hospitalization, associated with edema of her lower extremities, easy fatigability and generalized weakness. On her nitial physical examination blood pressure 191/67, heart rate 57, respiratory rate 13, oxygen saturation 98%.  Her lungs had mild rales bilaterally, heart S1-S2 present and rhythmic, abdomen soft nontender, positive lower extremity edema.  Chest x-ray had vascular congestion.  EKG normal sinus rhythm, normal axis normal intervals.  Patient was admitted to the hospital working diagnosis acute on chronic diastolic heart failure.   09/27/2018. Patient developed worsening encephalopathy and was transferred to the intensive care unit.  She was nonverbal, very confused and unable to answer questions.  10/01/2018.  Transferred to [facility] stepdown unit for further neurological work-up.  Encephalopathy likely multifactorial.  Clinically improving, she continued to have swallow dysfunction, she is n.p.o., family had declined PEG tube.    Assessment & Plan:   Principal Problem:   Sepsis ([place]) Active Problems:   Hyponatremia   Hypothyroidism   Congestive heart disease ([facility])   Hypothermia   AKI (acute kidney injury) ([place])   Acute encephalopathy   Normocytic anemia   Leukopenia   CKD (chronic kidney disease), stage IV ([facility])   DNR (do not resuscitate) discussion   Adult failure to thrive   Palliative care by specialist   Hypernatremia  1. Acute encephalopathy, toxic combined metabolic. No significant change in her mentation over last 24H, continue  tolerating well feedings per NG. Family wants to continue speech therapy over the next few days, if persistent swallow dysfunction then they will be open for peg tube discussion.   2. Right sided pneumonia. (present on admission). Continue aspiration precautions.   3. AKI on CKD stage 3, complicated with Hypernatremia. Stable renal function with serum cr at 1,30 from 1,38, Na is down to 140 and K is at 5,1 with serum bicarbonate at 19, Cl 114 with no anion gap. Will continue free water flushes per ng 250 ml q 6 H, discontinue IV fluids and will follow on renal panel in am. Avoid hypotension and nephrotoxic medications.   5. Hypertension (urgency).  Continue with clonidine patch., metoprolol has been changed to enteral, blood pressure systolic 443 with HR 73.   6. Acute on chronic diastolic heart failure decompensation. Stable with no signs of decompensation, will hold on IV fluids and continue metoprolol per Ng tube with good toleartion.  7. Hypothyroid. Levothyroxine now per ng, with good toleration.    DVT prophylaxis: enoxaparin   Code Status:  full Family Communication: I spoke with patient's son at the bedside and all questions were addressed.  Disposition Plan/ discharge barriers:  Pending snf placement, final decision on PEG tube per patient's family.     Body mass index is 24.04 kg/m. Malnutrition Type:  Nutrition Problem: Inadequate oral intake Etiology: inability to eat   Malnutrition Characteristics:  Signs/Symptoms: NPO status   Nutrition Interventions:  Interventions: Refer to RD note for recommendations  RN Pressure Injury Documentation:     Consultants:     Procedures:     Antimicrobials:       Subjective: Patient continue to be very weak and deconditioned,  per her son at the bedside she has been communicative but not yet back to baseline.   Objective: Vitals:   10/09/18 0450 10/09/18 0801 10/09/18 0814 10/09/18 1201  BP:   (!)  159/64 (!) 111/99  Pulse:   77 73  Resp: 20  18 18   Temp:   97.7 F (36.5 C) 97.8 F (36.6 C)  TempSrc:   Axillary Oral  SpO2: 99% 100% 100%   Weight:      Height:        Intake/Output Summary (Last 24 hours) at 10/09/2018 1208 Last data filed at 10/09/2018 0755 Gross per 24 hour  Intake 1736.33 ml  Output -  Net 1736.33 ml   Filed Weights   09/28/18 0500 10/01/18 0500 10/09/18 0344  Weight: 52 kg 52.9 kg 54 kg    Examination:   General: deconditioned  Neurology: Awake and alert, non focal. Not able to verbal communicate due to language barrier and confusion.  E ENT: positive pallor, no icterus, oral mucosa moist. NG tube in place.  Cardiovascular: No JVD. S1-S2 present, rhythmic, no gallops, rubs, or murmurs. No lower extremity edema. Pulmonary: positive breath sounds bilaterally, adequate air movement, no wheezing, rhonchi or rales. Gastrointestinal. Abdomen with no organomegaly, non tender, no rebound or guarding Skin. No rashes Musculoskeletal: no joint deformities     Data Reviewed: I have personally reviewed following labs and imaging studies  CBC: Recent Labs  Lab 10/04/18 1015 10/05/18 0627 10/07/18 0434  WBC 4.3 4.4 2.0*  NEUTROABS 2.8 3.0 1.8  HGB 8.9* 9.1* 9.6*  HCT 28.5* 29.0* 29.1*  MCV 96.9 95.1 94.5  PLT 184 190 762   Basic Metabolic Panel: Recent Labs  Lab 10/05/18 0627 10/06/18 0314 10/07/18 0434 10/08/18 0454 10/09/18 0610  NA 149* 148* 147* 144 140  K 4.1 4.1 4.3 4.5 5.1  CL 123* 121* 123* 119* 114*  CO2 18* 18* 19* 19* 19*  GLUCOSE 111* 127* 162* 160* 100*  BUN 66* 61* 50* 49* 43*  CREATININE 2.34* 1.86* 1.62* 1.38* 1.30*  CALCIUM 8.2* 8.0* 8.2* 7.8* 7.8*  MG 2.0  --   --   --   --    GFR: Estimated Creatinine Clearance: 26.7 mL/min (A) (by C-G formula based on SCr of 1.3 mg/dL (H)). Liver Function Tests: Recent Labs  Lab 10/04/18 1015  AST 31  ALT 17  ALKPHOS 53  BILITOT 0.9  PROT 5.6*  ALBUMIN 1.9*   No results  for input(s): LIPASE, AMYLASE in the last 168 hours. No results for input(s): AMMONIA in the last 168 hours. Coagulation Profile: Recent Labs  Lab 10/03/18 1215  INR 1.13   Cardiac Enzymes: No results for input(s): CKTOTAL, CKMB, CKMBINDEX, TROPONINI in the last 168 hours. BNP (last 3 results) No results for input(s): PROBNP in the last 8760 hours. HbA1C: No results for input(s): HGBA1C in the last 72 hours. CBG: Recent Labs  Lab 10/08/18 1731 10/08/18 2014 10/08/18 2321 10/09/18 0347 10/09/18 0805  GLUCAP 133* 144* 144* 138* 118*   Lipid Profile: No results for input(s): CHOL, HDL, LDLCALC, TRIG, CHOLHDL, LDLDIRECT in the last 72 hours. Thyroid Function Tests: No results for input(s): TSH, T4TOTAL, FREET4, T3FREE, THYROIDAB in the last 72 hours. Anemia Panel: No results for input(s): VITAMINB12, FOLATE, FERRITIN, TIBC, IRON, RETICCTPCT in the last 72 hours.    Radiology Studies: I have reviewed all of the imaging during this hospital visit personally     Scheduled Meds: . budesonide (PULMICORT) nebulizer solution  0.25 mg Nebulization BID  . chlorhexidine  15 mL Mouth Rinse BID  . cloNIDine  0.3 mg Transdermal Weekly  . enoxaparin (LOVENOX) injection  30 mg Subcutaneous Q24H  . free water  250 mL Per Tube Q6H  . [name] butt cream   Topical BID  . ipratropium-albuterol  3 mL Nebulization BID  . levothyroxine  75 mcg Per NG tube Q0600  . LORazepam  2 mg Intravenous Once  . mouth rinse  15 mL Mouth Rinse q12n4p  . metoprolol tartrate  25 mg Per Tube BID   Continuous Infusions: . feeding supplement (OSMOLITE 1.2 CAL) 1,000 mL (10/08/18 0455)     LOS: 15 days        Kenslei Hearty Gerome Apley, MD

## 2018-10-09 NOTE — Progress Notes (Signed)
  Speech Language Pathology Treatment: Dysphagia  Patient Details Name: Katie Mosley MRN: [id number] DOB: Jan 30, 1941 Today's Date: 10/09/2018 Time: 0321-2248 SLP Time Calculation (min) (ACUTE ONLY): 18 min  Assessment / Plan / Recommendation Clinical Impression  Pt was seen for dysphagia tx to assess improvement in swallow function. She was alert and cooperative throughout the session. Translation and interpretation was provided by the pt's son per family's request. Pt has demonstrated improvement in swallow function considering the fact that she is now demonstrating a volitional swallow. However, she inconsistently demonstrated throat clearing and coughing with nectar thick liquids via tsp despite the use of chin tuck posture which was recommended following the modified barium swallow study of 09/25/18; mod-max verbal and tactile cues were needed for the pt to consistently implement this. Additional trials of varying consistencies were deferred due to the pt's document risk of aspiration with those consistencies due to increased pharyngeal residue. It is recommended that a modified barium swallow study be conduced to further assess swallow function, determine the least restrictive diet, assess the need for long-term non-oral alimentation, and to help guide the conversation concerning goals of care.    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  Pt with h/o CHF, renal dysfunction. 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  MBS was conducted on 09/25/18 which revealed the following: Oral deficits significant mostly with liquids resulting in liquids spilling into pharynx not controlled and resulting in aspiration before the swallow with nectar.  Aspiration after the swallow from pyriform sinus spillage did elicit a  delayed cough but not adequately strong to clear pharynx.  Chin tuck posture improved airway protection significantly and prevented aspiration as well as decreased pharyngeal residuals.  Cued dry swallows helpful to clear oral residuals. A full liquid diet *nectar* diet was recommended at that time with allowance of thin water between meals. On 09/27/18 patient developed worsening encephalopathy and was transferred to the intensive care unit.  She was nonverbal, very confused and unable to answer questions. MRI of 09/29/18 was negative for acute changes and cortrak was placed on 09/30/18 since lethargy prohibity the pt from safely tolerating a p.o. diet.       SLP Plan  Continue with current plan of care;New goals to be determined pending instrumental study       Recommendations  Diet recommendations: NPO Liquids provided via: Teaspoon Medication Administration: Via alternative means                Oral Care Recommendations: Oral care QID Follow up Recommendations: Skilled Nursing facility SLP Visit Diagnosis: Dysphagia, oropharyngeal phase (R13.12) Plan: Continue with current plan of care;New goals to be determined pending instrumental study       Elayjah Chaney I. Hardin Negus, [place], [place] Office number [phone] Pager [place] 10/09/2018, 4:44 PM

## 2018-10-10 ENCOUNTER — Inpatient Hospital Stay (HOSPITAL_COMMUNITY): Payer: Medicare Other

## 2018-10-10 DIAGNOSIS — N179 Acute kidney failure, unspecified: Secondary | ICD-10-CM

## 2018-10-10 DIAGNOSIS — R1310 Dysphagia, unspecified: Secondary | ICD-10-CM

## 2018-10-10 LAB — BASIC METABOLIC PANEL
ANION GAP: 3 — AB (ref 5–15)
BUN: 41 mg/dL — ABNORMAL HIGH (ref 8–23)
CO2: 20 mmol/L — ABNORMAL LOW (ref 22–32)
Calcium: 7.4 mg/dL — ABNORMAL LOW (ref 8.9–10.3)
Chloride: 114 mmol/L — ABNORMAL HIGH (ref 98–111)
Creatinine, Ser: 1.19 mg/dL — ABNORMAL HIGH (ref 0.44–1.00)
GFR calc non Af Amer: 44 mL/min — ABNORMAL LOW (ref >60)
GFR, EST AFRICAN AMERICAN: 51 mL/min — AB (ref >60)
Glucose, Bld: 127 mg/dL — ABNORMAL HIGH (ref 70–99)
Potassium: 5.4 mmol/L — ABNORMAL HIGH (ref 3.5–5.1)
Sodium: 137 mmol/L (ref 135–145)

## 2018-10-10 LAB — GLUCOSE, CAPILLARY
Glucose-Capillary: 104 mg/dL — ABNORMAL HIGH (ref 70–99)
Glucose-Capillary: 115 mg/dL — ABNORMAL HIGH (ref 70–99)
Glucose-Capillary: 118 mg/dL — ABNORMAL HIGH (ref 70–99)
Glucose-Capillary: 122 mg/dL — ABNORMAL HIGH (ref 70–99)
Glucose-Capillary: 132 mg/dL — ABNORMAL HIGH (ref 70–99)
Glucose-Capillary: 95 mg/dL (ref 70–99)

## 2018-10-10 NOTE — Progress Notes (Signed)
Physical Therapy Treatment Patient Details Name: Katie Mosley MRN: [id number] DOB: [birth year] Today's Date: 10/10/2018    History of Present Illness 78 yo female admitted to ED on 1/28 for weakness and fatigue, with medical diagnosis of sepsis with possible PNA. PMH includes adrenal insufficiency, hypothyroid, CHF, HTN, hypothyroidism, dyspnea, CKDIII.     PT Comments    Pt performed transfer to edge of bed and OOB into sitting/ standing with use of sara stedy.  Pt presents with poor trunk control in sitting and in sara stedy.  Pt performed seated exercises in chair.  Plan next session for continued strengthening and progression of functional mobility.  Pt continues to benefit from SNF placement.     Follow Up Recommendations  SNF     Equipment Recommendations  None recommended by PT    Recommendations for Other Services       Precautions / Restrictions Precautions Precautions: Fall Precaution Comments: pt is [place], family translates Required Braces or Orthoses: Other Brace Other Brace: L PRAFO Restrictions Weight Bearing Restrictions: No    Mobility  Bed Mobility Overal bed mobility: Needs Assistance Bed Mobility: Rolling;Sidelying to Sit Rolling: Max assist;+2 for physical assistance Sidelying to sit: Max assist;+2 for physical assistance       General bed mobility comments: Cues for rolling, LE advancement and trunk elevation.    Transfers Overall transfer level: Needs assistance Equipment used: (sara stedy) Transfers: Sit to/from Stand Sit to Stand: +2 physical assistance;Max assist         General transfer comment: Pt required tactile cues for hand placement, facilitation into hip ext and trunk ext.  Pt required max +2 for external assistance to achieve standing and placement into sara stedy.    Ambulation/Gait Ambulation/Gait assistance: (NT unable )               Stairs             Wheelchair Mobility    Modified Rankin (Stroke  Patients Only)       Balance Overall balance assessment: Needs assistance   Sitting balance-Leahy Scale: Fair       Standing balance-Leahy Scale: Poor                              Cognition Arousal/Alertness: Awake/alert Behavior During Therapy: Flat affect Overall Cognitive Status: Impaired/Different from baseline Area of Impairment: Following commands;Problem solving                 Orientation Level: Person;Place;Time;Situation   Memory: Decreased short-term memory Following Commands: Follows one step commands inconsistently;Follows one step commands with increased time Safety/Judgement: Decreased awareness of safety;Decreased awareness of deficits   Problem Solving: Requires tactile cues;Difficulty sequencing;Decreased initiation;Slow processing;Requires verbal cues General Comments: difficult to assess due to language barrier, but following some commands during session.        Exercises General Exercises - Lower Extremity Long Arc Quad: AAROM;Both;10 reps;Seated Hip Flexion/Marching: Both;10 reps;AAROM;Seated    General Comments        Pertinent Vitals/Pain Pain Assessment: Faces Faces Pain Scale: No hurt    Home Living                      Prior Function            PT Goals (current goals can now be found in the care plan section) Acute Rehab PT Goals Patient Stated Goal: to get to the chair  Potential to Achieve Goals: Poor Progress towards PT goals: Progressing toward goals    Frequency    Min 2X/week      PT Plan Current plan remains appropriate    Co-evaluation              AM-PAC PT "6 Clicks" Mobility   Outcome Measure  Help needed turning from your back to your side while in a flat bed without using bedrails?: Total Help needed moving from lying on your back to sitting on the side of a flat bed without using bedrails?: Total Help needed moving to and from a bed to a chair (including a wheelchair)?:  Total Help needed standing up from a chair using your arms (e.g., wheelchair or bedside chair)?: Total Help needed to walk in hospital room?: Total Help needed climbing 3-5 steps with a railing? : Total 6 Click Score: 6    End of Session Equipment Utilized During Treatment: Gait belt Activity Tolerance: Patient tolerated treatment well Patient left: in bed;with call bell/phone within reach;with family/visitor present Nurse Communication: Mobility status PT Visit Diagnosis: Other abnormalities of gait and mobility (R26.89);Difficulty in walking, not elsewhere classified (R26.2)     Time: 2979-8921 PT Time Calculation (min) (ACUTE ONLY): 24 min  Charges:  $Therapeutic Activity: 23-37 mins                     Governor Rooks, PTA Acute Rehabilitation Services Pager [phone] Office [phone]     Sheree Lalla Eli Hose 10/10/2018, 4:12 PM

## 2018-10-10 NOTE — Progress Notes (Signed)
  Speech Language Pathology Treatment:    Patient Details Name: AVEENA BARI MRN: [id number] DOB: [birth year] Today's Date: 10/10/2018 Time: 1749-4496 SLP Time Calculation (min) (ACUTE ONLY): 7 min  Assessment / Plan / Recommendation Clinical Impression  Pt was seen for treatment to assess consistency in swallow function and her candidacy for modified barium swallow study based on whether she is still demonstrating a volitional swallow. Pt was alert and translation was provided by the pt's family. Pt accepted trials of nectar thick liquids and exhibited a volitional swallow but continues to demonstrate throat clearing and coughing. SLP will proceed with the modified barium swallow study which is currently scheduled for this morning.    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  Pt with h/o CHF, renal dysfunction. 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  MBS was conducted on 09/25/18 which revealed the following: Oral deficits significant mostly with liquids resulting in liquids spilling into pharynx not controlled and resulting in aspiration before the swallow with nectar.  Aspiration after the swallow from pyriform sinus spillage did elicit a delayed cough but not adequately strong to clear pharynx.  Chin tuck posture improved airway protection significantly and prevented aspiration as well as decreased pharyngeal residuals.  Cued dry swallows helpful to clear oral residuals. A full liquid diet *nectar* diet was recommended at that time with allowance of thin water between meals. On 09/27/18 patient developed worsening encephalopathy and was transferred to the intensive care unit.  She was nonverbal, very confused and unable to answer questions. MRI of 09/29/18 was negative for acute changes and cortrak was placed on  09/30/18 since lethargy prohibity the pt from safely tolerating a p.o. diet.       SLP Plan  Continue with current plan of care;New goals to be determined pending instrumental study       Recommendations  Liquids provided via: Teaspoon Medication Administration: Via alternative means                Oral Care Recommendations: Oral care QID Follow up Recommendations: Skilled Nursing facility SLP Visit Diagnosis: Dysphagia, oropharyngeal phase (R13.12) Plan: Continue with current plan of care;New goals to be determined pending instrumental study       [place] I. Hardin Negus, [place], [place] Office number [phone] Pager [place] 10/10/2018, 8:35 AM

## 2018-10-10 NOTE — Progress Notes (Signed)
Modified Barium Swallow Progress Note  Patient Details  Name: Katie Mosley MRN: [id number] Date of Birth: 09-26-1940  Today's Date: 10/10/2018  Modified Barium Swallow completed.  Full report located under Chart Review in the Imaging Section.  Brief recommendations include the following:  Clinical Impression  Pt presents with moderate-severe oropharyngeal dysphagia characterized by oral holding, a severely increased oral transit time, a pharyngeal delay, reduced lingual retraction which resulted in moderate vallecular residue, and pyriform sinus residue secondary to reduced laryngeal excursion. No instances of penetrations/aspiration were noted during deglution but thin liquids barium was noted on the laryngeal surface of the epiglottis after the pt was layed flat for repositioning. A liquid wash was effective in reducing pharyngeal residue to a more functional level and amount of residue  Increased with bolus size. Pt is at high risk for aspiration after deglutition due to pharyngeal residue if swallowing precautions are not consistently observed. It is recommended that a puree diet with nectar thick liquids be initiated at this time with strict observance of swallowing precautions. However, with consideration of the level of support the pt needed to consistently trigger a volitional swallow, adequacy of oral intake is questioned and supplimental non-oral alimentation (e.g., G-tube) may be necessary. Pt's son, RN Kasandra Knudsen), and Dr. Cathlean Sauer were all educated regarding the results and recommendations.    Swallow Evaluation Recommendations       SLP Diet Recommendations: Alternative means - long-term;Dysphagia 1 (Puree) solids;Nectar thick liquid   Liquid Administration via: Cup;Straw   Medication Administration: Crushed with puree   Supervision: Full supervision/cueing for compensatory strategies   Compensations: Minimize environmental distractions;Slow rate;Small sips/bites;Multiple dry  swallows after each bite/sip;Follow solids with liquid   Postural Changes: Seated upright at 90 degrees   Oral Care Recommendations: Oral care BID;Oral care before and after PO;Staff/trained caregiver to provide oral care   Other Recommendations: Order thickener from pharmacy  [place] I. Hardin Negus, [place], [place] Office number [phone] Pager [phone]  Katie Mosley 10/10/2018,11:49 AM

## 2018-10-10 NOTE — Progress Notes (Signed)
Daily Progress Note   Patient Name: Katie Mosley       Date: 10/10/2018 DOB: August 19, 1941  Age: 78 y.o. MRN#: [id number] Attending Physician: Tawni Millers Primary Care Physician: Nolene Ebbs, MD Admit Date: 09/23/2018  Reason for Consultation/Follow-up: Establishing goals of care  Subjective: Patient's grand daughters are at bedside.  1 is from [place] and is a Therapist, music.  The other is an ICU RN.  After discharge the patient is going to live with her son and the ICU RN.   They do not want her to go to SNF.  We discussed her nutrition.  The two grand daughters are against PEG tube.  They feel the patient would be against PEG tube.  They are hopeful her eating will improve and it will not be an issue.  [place] dtr who is a GP in [place] tells me that when a similar situation happened to the patient 2-3 years ago the etiology was felt to be endocrine.  I mentioned that the patient's dose of Levothyroxine has been increased this admission from 50 mcg to 75 mcg.  We discussed home health vs hospice.  The family is in agreement they want her discharged with PT/OT/SLP.  If that fails then they will consider Hospice.  Patient complains of mid back pain.   Assessment: 78 yo somalian female slowly returning to baseline.  Quite deconditioned at this point.  Slowly beginning to eat again.   Length of Stay: 16  Current Medications: Scheduled Meds:  . budesonide (PULMICORT) nebulizer solution  0.25 mg Nebulization BID  . chlorhexidine  15 mL Mouth Rinse BID  . cloNIDine  0.3 mg Transdermal Weekly  . enoxaparin (LOVENOX) injection  30 mg Subcutaneous Q24H  . free water  250 mL Per Tube Q6H  . [name] butt cream   Topical BID  . ipratropium-albuterol  3 mL  Nebulization BID  . levothyroxine  75 mcg Per NG tube Q0600  . LORazepam  2 mg Intravenous Once  . mouth rinse  15 mL Mouth Rinse q12n4p  . metoprolol tartrate  25 mg Per Tube BID    Continuous Infusions: . feeding supplement (OSMOLITE 1.2 CAL) 1,000 mL (10/08/18 0455)    PRN Meds: acetaminophen **OR** acetaminophen, glycopyrrolate, hydrALAZINE  Physical Exam       Well developed elderly  female, awake, talking.  Was up to chair earlier today CV rrr resp no distress Abdomen soft, nt, nd M/S lower thoracic back mildly tender to palpation (near LP site) Skin without rash, bruise or lesion  Vital Signs: BP (!) 141/66   Pulse 74   Temp 97.6 F (36.4 C) (Axillary)   Resp 18   Ht 4\' 11"  (1.499 m)   Wt 54 kg   SpO2 99%   BMI 24.04 kg/m  SpO2: SpO2: 99 % O2 Device: O2 Device: Room Air O2 Flow Rate: O2 Flow Rate (L/min): 1 L/min  Intake/output summary:   Intake/Output Summary (Last 24 hours) at 10/10/2018 1448 Last data filed at 10/10/2018 5573 Gross per 24 hour  Intake 0 ml  Output -  Net 0 ml   LBM: Last BM Date: 10/09/18 Baseline Weight: Weight: 53.5 kg Most recent weight: Weight: 54 kg       Palliative Assessment/Data: 40%    Flowsheet Rows     Most Recent Value  Intake Tab  Referral Department  Hospitalist  Unit at Time of Referral  ER  Palliative Care Primary Diagnosis  Cardiac  Date Notified  09/24/18  Palliative Care Type  New Palliative care  Reason for referral  Clarify Goals of Care, Psychosocial or Spiritual support  Date of Admission  09/23/18  Date first seen by Palliative Care  09/24/18  # of days Palliative referral response time  0 Day(s)  # of days IP prior to Palliative referral  1  Clinical Assessment  Psychosocial & Spiritual Assessment  Palliative Care Outcomes      Patient Active Problem List   Diagnosis Date Noted  . Hypernatremia 10/08/2018  . Adult failure to thrive   . Palliative care by specialist   . Sepsis ([place])  09/25/2018  . DNR (do not resuscitate) discussion   . Dysphagia   . Normocytic anemia 09/07/2018  . Leukopenia 09/07/2018  . CKD (chronic kidney disease), stage IV ([place]) 09/07/2018  . Constipation   . Acute encephalopathy   . Altered mental status 08/08/2015  . 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 AKI (acute kidney injury) ([place]) 08/08/2015  . Hypothermia 02/15/2015  . Pulmonary hypertension ([place]) 02/14/2015  . Elevated TSH 02/14/2015  . Hyperkalemia 02/14/2015  . Pleuritic chest pain 02/13/2015  . GERD (gastroesophageal reflux disease)   . Asthma   . Congestive heart disease ([place])   . Asthma, mild intermittent   . Ischemic colitis ([place]) 10/31/2014  . Hyponatremia 10/31/2014  . Benign essential HTN 10/31/2014  . Hypothyroidism 10/31/2014    Palliative Care Plan    Recommendations/Plan:  Continue current care.   Patient slowly improving. Family leaning against PEG - would much prefer for the patient to take sips and bites on her own Family requests home with home health therapies at discharge  PMT will follow at a distance - chart check.  Please do not hesitate to call us back if we can be of assistance.  Goals of Care and Additional Recommendations:  Limitations on Scope of Treatment: Full Scope Treatment  Code Status:  Full code  Prognosis:   Unable to determine   Discharge Planning:  Home with [place] was discussed with Attending, and family.  Thank you for allowing the Palliative Medicine Team to assist in the care of this patient.  Total time spent:  35 min     Greater than 50%  of this time was spent counseling and coordinating care related to  the above assessment and plan.  Florentina Jenny, PA-C Palliative Medicine  Please contact Palliative MedicineTeam phone at [phone] for questions and concerns between 7 am - 7 pm.   Please see AMION for individual provider pager numbers.

## 2018-10-10 NOTE — Plan of Care (Signed)
Pt is progressing toward desired goal 

## 2018-10-10 NOTE — Progress Notes (Signed)
PROGRESS NOTE    Katie Mosley  SHF:[id number] DOB: 07/12/1941 DOA: 09/23/2018 PCP: Nolene Ebbs, MD    Brief Narrative:  78 year old female who presented with dyspnea and weakness. She does have significant past medical history for diastolic heart failure, chronic hyponatremia, chronic kidney disease stage III, chronic anemia, hypothyroidism and hypertension. Patient reported fatigue and dyspnea for about 7 days prior to hospitalization, associated with edema of her lower extremities, easy fatigability and generalized weakness. On hernitial physical examination blood pressure 191/67, heart rate 57, respiratoryrate13, oxygen saturation 98%.Her lungs had mild rales bilaterally, heart S1-S2 present and rhythmic, abdomen soft nontender, positive lower extremity edema. Chest x-ray had vascular congestion. EKG normal sinus rhythm, normal axis normal intervals.  Patient was admitted to the hospital working diagnosis acute on chronic diastolic heart failure.   09/27/2018.Patient developed worsening encephalopathy and was transferred to the intensive care unit. She was nonverbal, very confused and unable to answer questions.  10/01/2018.Transferred to [facility] stepdown unit for further neurological work-up. Encephalopathy likely multifactorial.Clinically improving, she continued to have swallow dysfunction, she is n.p.o., family had declined PEG tube.   Assessment & Plan:   Principal Problem:   Sepsis ([place]) Active Problems:   Hyponatremia   Hypothyroidism   Congestive heart disease (H[facility])   Hypothermia   AKI (acute kidney injury) ([place])   Acute encephalopathy   Normocytic anemia   Leukopenia   CKD (chronic kidney disease), stage IV (H[facility])   DNR (do not resuscitate) discussion   Adult failure to thrive   Palliative care by specialist   Hypernatremia   1. Acute encephalopathy, toxic combined metabolic. today her granddaughter is at bedside and patient seems to be  more interactive, has been tolerating tube feedings, had swallow evaluation, continue to retain food in her mouth for long time, but no significant aspiration, now she is allowed to eat pureed per speech recommendations. Will plan to remove NG tube in the next 48 H, and continue oral feedings. Patient's family continue to declined PEG tube for now. Continue aspiration precautions.   2. Right sided pneumonia. (present on admission). Patient oxygenating well, continue aspiration precautions.  3.AKI on CKD stage 3, complicated with Hypernatremia. Now off Iv fluids, tolerating feedings and free water per ng tube. Serum Na at 137 with K at 5,4 and serum bicarbonate at 20, with serum cr at 1,19. Will follow renal panel in 48 H.   5. Hypertension (urgency).  On clonidine patch and metoprolol, blood pressure 502 systolic with HR 68.   6. Acute on chronic diastolic heart failure decompensation. Stable with no further decompensation, continue b blockade and blood pressure control with clonidine.  7. Hypothyroid. On levothyroxine.     DVT prophylaxis:enoxaparin Code Status:full Family Communication:I spoke with patient'sgranddaughterat the bedside and all questions were addressed.  Disposition Plan/ discharge barriers:Plan to remove Ng in 48 H, then possible dc home with home health services.   Body mass index is 24.04 kg/m. Malnutrition Type:  Nutrition Problem: Inadequate oral intake Etiology: inability to eat   Malnutrition Characteristics:  Signs/Symptoms: NPO status   Nutrition Interventions:  Interventions: Refer to RD note for recommendations  RN Pressure Injury Documentation:     Consultants:     Procedures:     Antimicrobials:       Subjective: Patient is more awake and interactive, no apparent pain or dyspnea, positive deconditioning. Limited history due to language barrier.   Objective: Vitals:   10/10/18 7741 10/10/18 2878 10/10/18 0734  10/10/18 6767  BP: (!) 168/80 138/64    Pulse: 74     Resp: (!) 22     Temp: 97.6 F (36.4 C)     TempSrc: Axillary     SpO2: 100%  100% 100%  Weight:      Height:        Intake/Output Summary (Last 24 hours) at 10/10/2018 1027 Last data filed at 10/10/2018 2947 Gross per 24 hour  Intake 0 ml  Output -  Net 0 ml   Filed Weights   09/28/18 0500 10/01/18 0500 10/09/18 0344  Weight: 52 kg 52.9 kg 54 kg    Examination:   General: Not in pain or dyspnea, deconditioned and ill looking appearing  Neurology: Awake and alert, non focal  E ENT: no pallor, no icterus, oral mucosa moist/ ng tube in place.  Cardiovascular: No JVD. S1-S2 present, rhythmic, no gallops, rubs, or murmurs. No lower extremity edema. Pulmonary: positive breath sounds bilaterally, poor inspiratory effort, no wheezing, rhonchi or rales. Gastrointestinal. Abdomen with no organomegaly, non tender, no rebound or guarding Skin. No rashes Musculoskeletal: no joint deformities     Data Reviewed: I have personally reviewed following labs and imaging studies  CBC: Recent Labs  Lab 10/04/18 1015 10/05/18 0627 10/07/18 0434  WBC 4.3 4.4 2.0*  NEUTROABS 2.8 3.0 1.8  HGB 8.9* 9.1* 9.6*  HCT 28.5* 29.0* 29.1*  MCV 96.9 95.1 94.5  PLT 184 190 654   Basic Metabolic Panel: Recent Labs  Lab 10/05/18 0627 10/06/18 0314 10/07/18 0434 10/08/18 0454 10/09/18 0610 10/10/18 0625  NA 149* 148* 147* 144 140 137  K 4.1 4.1 4.3 4.5 5.1 5.4*  CL 123* 121* 123* 119* 114* 114*  CO2 18* 18* 19* 19* 19* 20*  GLUCOSE 111* 127* 162* 160* 100* 127*  BUN 66* 61* 50* 49* 43* 41*  CREATININE 2.34* 1.86* 1.62* 1.38* 1.30* 1.19*  CALCIUM 8.2* 8.0* 8.2* 7.8* 7.8* 7.4*  MG 2.0  --   --   --   --   --    GFR: Estimated Creatinine Clearance: 29.2 mL/min (A) (by C-G formula based on SCr of 1.19 mg/dL (H)). Liver Function Tests: Recent Labs  Lab 10/04/18 1015  AST 31  ALT 17  ALKPHOS 53  BILITOT 0.9  PROT 5.6*  ALBUMIN  1.9*   No results for input(s): LIPASE, AMYLASE in the last 168 hours. No results for input(s): AMMONIA in the last 168 hours. Coagulation Profile: Recent Labs  Lab 10/03/18 1215  INR 1.13   Cardiac Enzymes: No results for input(s): CKTOTAL, CKMB, CKMBINDEX, TROPONINI in the last 168 hours. BNP (last 3 results) No results for input(s): PROBNP in the last 8760 hours. HbA1C: No results for input(s): HGBA1C in the last 72 hours. CBG: Recent Labs  Lab 10/09/18 1203 10/09/18 2032 10/10/18 0022 10/10/18 0448 10/10/18 0842  GLUCAP 130* 124* 122* 118* 132*   Lipid Profile: No results for input(s): CHOL, HDL, LDLCALC, TRIG, CHOLHDL, LDLDIRECT in the last 72 hours. Thyroid Function Tests: No results for input(s): TSH, T4TOTAL, FREET4, T3FREE, THYROIDAB in the last 72 hours. Anemia Panel: No results for input(s): VITAMINB12, FOLATE, FERRITIN, TIBC, IRON, RETICCTPCT in the last 72 hours.    Radiology Studies: I have reviewed all of the imaging during this hospital visit personally     Scheduled Meds: . budesonide (PULMICORT) nebulizer solution  0.25 mg Nebulization BID  . chlorhexidine  15 mL Mouth Rinse BID  . cloNIDine  0.3 mg Transdermal Weekly  . enoxaparin (  LOVENOX) injection  30 mg Subcutaneous Q24H  . free water  250 mL Per Tube Q6H  . [name] butt cream   Topical BID  . ipratropium-albuterol  3 mL Nebulization BID  . levothyroxine  75 mcg Per NG tube Q0600  . LORazepam  2 mg Intravenous Once  . mouth rinse  15 mL Mouth Rinse q12n4p  . metoprolol tartrate  25 mg Per Tube BID   Continuous Infusions: . feeding supplement (OSMOLITE 1.2 CAL) 1,000 mL (10/08/18 0455)     LOS: 16 days        Mauricio Gerome Apley, MD

## 2018-10-11 LAB — GLUCOSE, CAPILLARY
GLUCOSE-CAPILLARY: 83 mg/dL (ref 70–99)
Glucose-Capillary: 113 mg/dL — ABNORMAL HIGH (ref 70–99)
Glucose-Capillary: 116 mg/dL — ABNORMAL HIGH (ref 70–99)
Glucose-Capillary: 119 mg/dL — ABNORMAL HIGH (ref 70–99)
Glucose-Capillary: 80 mg/dL (ref 70–99)
Glucose-Capillary: 85 mg/dL (ref 70–99)
Glucose-Capillary: 96 mg/dL (ref 70–99)

## 2018-10-11 MED ORDER — ENOXAPARIN SODIUM 40 MG/0.4ML ~~LOC~~ SOLN
40.0000 mg | SUBCUTANEOUS | Status: DC
  Administered 2018-10-12 – 2018-10-14 (×3): 40 mg via SUBCUTANEOUS
  Filled 2018-10-11 (×3): qty 0.4

## 2018-10-11 MED ORDER — OSMOLITE 1.2 CAL PO LIQD: 1000.0000 mL | ORAL | Status: DC

## 2018-10-11 NOTE — Progress Notes (Signed)
PROGRESS NOTE    Katie Mosley  AST:[id number] DOB: 1941-06-18 DOA: 09/23/2018 PCP: Nolene Ebbs, MD    Brief Narrative:  78 year old female who presented with dyspnea and weakness. She does have significant past medical history for diastolic heart failure, chronic hyponatremia, chronic kidney disease stage III, chronic anemia, hypothyroidism and hypertension. Patient reported fatigue and dyspnea for about 7 days prior to hospitalization, associated with edema of her lower extremities, easy fatigability and generalized weakness. On hernitial physical examination blood pressure 191/67, heart rate 57, respiratoryrate13, oxygen saturation 98%.Her lungs had mild rales bilaterally, heart S1-S2 present and rhythmic, abdomen soft nontender, positive lower extremity edema. Chest x-ray had vascular congestion. EKG normal sinus rhythm, normal axis normal intervals.  Patient was admitted to the hospital working diagnosis acute on chronic diastolic heart failure.   09/27/2018.Patient developed worsening encephalopathy and was transferred to the intensive care unit. She was nonverbal, very confused and unable to answer questions.  10/01/2018.Transferred to [facility] stepdown unit for further neurological work-up. Encephalopathy likely multifactorial.Clinically improving, she continued to have swallow dysfunction, she is n.p.o., family had declined PEG tube.  Patient no able to eat dysphagia 1 diet, patient's family have decided not to proceed with PEG tube for now.    Assessment & Plan:   Principal Problem:   Sepsis ([place]) Active Problems:   Hyponatremia   Hypothyroidism   Congestive heart disease ([place])   AKI (acute kidney injury) ([place])   Acute encephalopathy   Normocytic anemia   Leukopenia   CKD (chronic kidney disease), stage IV (HCC)   DNR (do not resuscitate) discussion   Adult failure to thrive   Palliative care by specialist   Hypernatremia   Severe swallowing  dysfunction   1. Acute encephalopathy, toxic combined metabolic.Patient continue very deconditioned, this am not much interactive. Will continue supportive feedings per ng tube continue to encourage po intake, continue aspiration precautions and neuro checks per unit protocol. Plan to remove NG tube on Monday.   2. Right sided pneumonia. (present on admission).On aspiration precautions. Clinically pneumonia has resolved.   3.AKI on CKD stage 3, complicated with Hypernatremia. continue tube feedings and free water flushes, will check renal panel in am. Avoid hypotension and nephrotoxic medications.   5. Hypertension (urgency).Tolerating wellclonidine patch and enteral metoprolol.  6. Acute on chronic diastolic heart failure decompensation.stable, no active decompensation, will discontinue telemetry. Continue metoprolol. Clinically euvolemic.   7. Hypothyroid. Continue with levothyroxine.    DVT prophylaxis:enoxaparin Code Status:full Family Communication:no family at the bedside.  Disposition Plan/ discharge barriers:Patient will be discharge home next week, without NG tube, under the care of her family members.    Body mass index is 42.62 kg/m. Malnutrition Type:  Nutrition Problem: Inadequate oral intake Etiology: inability to eat   Malnutrition Characteristics:  Signs/Symptoms: NPO status   Nutrition Interventions:  Interventions: Refer to RD note for recommendations  RN Pressure Injury Documentation:     Consultants:     Procedures:     Antimicrobials:       Subjective: Patient continue to be very weak and deconditioned, this am is somnolent on my examination, there is no family at her bedside, apparently has been tolerating tube feedings well, and by mouth feedings.   Objective: Vitals:   10/10/18 2037 10/10/18 2358 10/10/18 2359 10/11/18 0500  BP:  (!) 132/48    Pulse: 68 65    Resp: 18 14 15    Temp:  97.6 F (36.4 C)      TempSrc:  Axillary    SpO2: 100% 100%    Weight:    95.7 kg  Height:        Intake/Output Summary (Last 24 hours) at 10/11/2018 0810 Last data filed at 10/11/2018 8502 Gross per 24 hour  Intake 0 ml  Output 500 ml  Net -500 ml   Filed Weights   10/01/18 0500 10/09/18 0344 10/11/18 0500  Weight: 52.9 kg 54 kg 95.7 kg    Examination:   General: deconditioned  Neurology: Patient somnolent with no signs of acute distress.  E ENT: mild pallor, no icterus, oral mucosa moist/ NG tube in place.  Cardiovascular: No JVD. S1-S2 present, rhythmic, no gallops, rubs, or murmurs. No lower extremity edema. Pulmonary: positive breath sounds bilaterally,poor air movement, no wheezing, rhonchi or rales. Gastrointestinal. Abdomen with no organomegaly, non tender, no rebound or guarding Skin. No rashes Musculoskeletal: no joint deformities     Data Reviewed: I have personally reviewed following labs and imaging studies  CBC: Recent Labs  Lab 10/04/18 1015 10/05/18 0627 10/07/18 0434  WBC 4.3 4.4 2.0*  NEUTROABS 2.8 3.0 1.8  HGB 8.9* 9.1* 9.6*  HCT 28.5* 29.0* 29.1*  MCV 96.9 95.1 94.5  PLT 184 190 774   Basic Metabolic Panel: Recent Labs  Lab 10/05/18 0627 10/06/18 0314 10/07/18 0434 10/08/18 0454 10/09/18 0610 10/10/18 0625  NA 149* 148* 147* 144 140 137  K 4.1 4.1 4.3 4.5 5.1 5.4*  CL 123* 121* 123* 119* 114* 114*  CO2 18* 18* 19* 19* 19* 20*  GLUCOSE 111* 127* 162* 160* 100* 127*  BUN 66* 61* 50* 49* 43* 41*  CREATININE 2.34* 1.86* 1.62* 1.38* 1.30* 1.19*  CALCIUM 8.2* 8.0* 8.2* 7.8* 7.8* 7.4*  MG 2.0  --   --   --   --   --    GFR: Estimated Creatinine Clearance: 39.5 mL/min (A) (by C-G formula based on SCr of 1.19 mg/dL (H)). Liver Function Tests: Recent Labs  Lab 10/04/18 1015  AST 31  ALT 17  ALKPHOS 53  BILITOT 0.9  PROT 5.6*  ALBUMIN 1.9*   No results for input(s): LIPASE, AMYLASE in the last 168 hours. No results for input(s): AMMONIA in the last  168 hours. Coagulation Profile: No results for input(s): INR, PROTIME in the last 168 hours. Cardiac Enzymes: No results for input(s): CKTOTAL, CKMB, CKMBINDEX, TROPONINI in the last 168 hours. BNP (last 3 results) No results for input(s): PROBNP in the last 8760 hours. HbA1C: No results for input(s): HGBA1C in the last 72 hours. CBG: Recent Labs  Lab 10/10/18 1635 10/10/18 2005 10/11/18 0046 10/11/18 0359 10/11/18 0759  GLUCAP 95 104* 83 85 80   Lipid Profile: No results for input(s): CHOL, HDL, LDLCALC, TRIG, CHOLHDL, LDLDIRECT in the last 72 hours. Thyroid Function Tests: No results for input(s): TSH, T4TOTAL, FREET4, T3FREE, THYROIDAB in the last 72 hours. Anemia Panel: No results for input(s): VITAMINB12, FOLATE, FERRITIN, TIBC, IRON, RETICCTPCT in the last 72 hours.    Radiology Studies: I have reviewed all of the imaging during this hospital visit personally     Scheduled Meds: . budesonide (PULMICORT) nebulizer solution  0.25 mg Nebulization BID  . chlorhexidine  15 mL Mouth Rinse BID  . cloNIDine  0.3 mg Transdermal Weekly  . enoxaparin (LOVENOX) injection  30 mg Subcutaneous Q24H  . free water  250 mL Per Tube Q6H  . [name] butt cream   Topical BID  . ipratropium-albuterol  3 mL Nebulization BID  .  levothyroxine  75 mcg Per NG tube Q0600  . LORazepam  2 mg Intravenous Once  . mouth rinse  15 mL Mouth Rinse q12n4p  . metoprolol tartrate  25 mg Per Tube BID   Continuous Infusions: . feeding supplement (OSMOLITE 1.2 CAL) 1,000 mL (10/08/18 0455)     LOS: 17 days        Yarel Rushlow Gerome Apley, MD

## 2018-10-12 DIAGNOSIS — I5032 Chronic diastolic (congestive) heart failure: Secondary | ICD-10-CM

## 2018-10-12 LAB — BASIC METABOLIC PANEL
Anion gap: 8 (ref 5–15)
BUN: 29 mg/dL — ABNORMAL HIGH (ref 8–23)
CO2: 18 mmol/L — ABNORMAL LOW (ref 22–32)
Calcium: 8.3 mg/dL — ABNORMAL LOW (ref 8.9–10.3)
Chloride: 107 mmol/L (ref 98–111)
Creatinine, Ser: 1.04 mg/dL — ABNORMAL HIGH (ref 0.44–1.00)
GFR calc non Af Amer: 51 mL/min — ABNORMAL LOW (ref >60)
GFR, EST AFRICAN AMERICAN: 60 mL/min — AB (ref >60)
Glucose, Bld: 108 mg/dL — ABNORMAL HIGH (ref 70–99)
Potassium: 5.4 mmol/L — ABNORMAL HIGH (ref 3.5–5.1)
Sodium: 133 mmol/L — ABNORMAL LOW (ref 135–145)

## 2018-10-12 LAB — GLUCOSE, CAPILLARY
GLUCOSE-CAPILLARY: 119 mg/dL — AB (ref 70–99)
GLUCOSE-CAPILLARY: 145 mg/dL — AB (ref 70–99)
Glucose-Capillary: 115 mg/dL — ABNORMAL HIGH (ref 70–99)
Glucose-Capillary: 116 mg/dL — ABNORMAL HIGH (ref 70–99)
Glucose-Capillary: 123 mg/dL — ABNORMAL HIGH (ref 70–99)
Glucose-Capillary: 120 mg/dL — ABNORMAL HIGH (ref 70–99)

## 2018-10-12 MED ORDER — ONDANSETRON HCL 4 MG/2ML IJ SOLN
4.0000 mg | Freq: Four times a day (QID) | INTRAMUSCULAR | Status: DC | PRN
  Administered 2018-10-12: 4 mg via INTRAVENOUS
  Filled 2018-10-12: qty 2

## 2018-10-12 NOTE — Plan of Care (Signed)
Patient stable, discussed POC with patient and family, agreeable with plan, denies question/concerns at this time.  

## 2018-10-12 NOTE — Progress Notes (Signed)
PROGRESS NOTE    Katie Mosley  UXL:[id number] DOB: 11/27/1940 DOA: 09/23/2018 PCP: Nolene Ebbs, MD    Brief Narrative:  78 year old female who presented with dyspnea and weakness. She does have significant past medical history for diastolic heart failure, chronic hyponatremia, chronic kidney disease stage III, chronic anemia, hypothyroidism and hypertension. Patient reported fatigue and dyspnea for about 7 days prior to hospitalization, associated with edema of her lower extremities, easy fatigability and generalized weakness. On hernitial physical examination blood pressure 191/67, heart rate 57, respiratoryrate13, oxygen saturation 98%.Her lungs had mild rales bilaterally, heart S1-S2 present and rhythmic, abdomen soft nontender, positive lower extremity edema. Chest x-ray had vascular congestion. EKG normal sinus rhythm, normal axis normal intervals.  Patient was admitted to the hospital working diagnosis acute on chronic diastolic heart failure.   09/27/2018.Patient developed worsening encephalopathy and was transferred to the intensive care unit. She was nonverbal, very confused and unable to answer questions.  10/01/2018.Transferred to [facility] stepdown unit for further neurological work-up. Encephalopathy likely multifactorial.Clinically improving, she continued to have swallow dysfunction, she is n.p.o., family had declined PEG tube.  Patient no able to eat dysphagia 1 diet, patient's family have decided not to proceed with PEG tube for now.    Assessment & Plan:   Principal Problem:   Sepsis ([place]) Active Problems:   Hyponatremia   Hypothyroidism   Congestive heart disease ([place])   AKI (acute kidney injury) ([place])   Acute encephalopathy   Normocytic anemia   Leukopenia   CKD (chronic kidney disease), stage IV (HCC)   DNR (do not resuscitate) discussion   Adult failure to thrive   Palliative care by specialist   Hypernatremia   Severe swallowing  dysfunction    1. Acute encephalopathy, toxic combined metabolic.Patient continue to be poorly reactive, yesterday with not much po intake. Will need more intense nutrition and hydration to prevent worsening condition. I spoke with patient's family at the bedside about nutritional options including peg tube, risks and benefits. All questions were addressed. They may make a decision about peg in am. Continue aspiration precautions.   2. Right sided pneumonia. (present on admission).Completed antibiotic therapy. Clinically resolved, continue aspiration precautions.   3.AKI on CKD stage 3, complicated with Hypernatremia.Stable renal function with serum cr at 1,0 with K at 5,4 and serum Na at 133. Will continue tube feedings and free water flushes. Patient has a high risk for dehydration with no enteral feedings.   5. Hypertension (urgency).Continue blood pressure control withclonidine patchandenteral metoprolol.  6. Acute on chronic diastolic heart failure decompensation.Continue metoprolol. Clinically stable with no signs of decompensation.  7. Hypothyroid. On levothyroxine.   DVT prophylaxis:enoxaparin Code Status:full Family Communication:I spoke with patient's family at the bedside and all questions were addressed.   Disposition Plan/ discharge barriers:Plan to discharge home in 24 to 48 H.  Body mass index is 42.41 kg/m. Malnutrition Type:  Nutrition Problem: Inadequate oral intake Etiology: inability to eat   Malnutrition Characteristics:  Signs/Symptoms: NPO status   Nutrition Interventions:  Interventions: Refer to RD note for recommendations  RN Pressure Injury Documentation:     Consultants:     Procedures:     Antimicrobials:       Subjective: Patient yesterday was more somnolent and less reactive, no significant po intake, reported per her family at the bedside. This am she opens her eyes to touch and has mumbling words.     Objective: Vitals:   10/12/18 0333 10/12/18 0500 10/12/18 0730 10/12/18 5366  BP: (!) 162/56  (!) 149/52   Pulse: 66  62   Resp: 20  16   Temp: 97.6 F (36.4 C)  (!) 97.4 F (36.3 C)   TempSrc: Oral  Oral   SpO2: 100%  100% 98%  Weight:  95.3 kg    Height:        Intake/Output Summary (Last 24 hours) at 10/12/2018 0929 Last data filed at 10/12/2018 0932 Gross per 24 hour  Intake 289.58 ml  Output 800 ml  Net -510.42 ml   Filed Weights   10/09/18 0344 10/11/18 0500 10/12/18 0500  Weight: 54 kg 95.7 kg 95.3 kg    Examination:   General: continue to be very deconditioned and ill looking appearing Neurology: somnolent, open eyes to voice, mumbling words.  E ENT: positive pallor, no icterus, oral mucosa moist/ NG tube in place.  Cardiovascular: No JVD. S1-S2 present, rhythmic, no gallops, rubs, or murmurs. No lower extremity edema. Pulmonary: positive breath sounds bilaterally, poor inspiratory effort, no wheezing, rhonchi or rales. On anterior auscultation.  Gastrointestinal. Abdomen with no organomegaly, non tender, no rebound or guarding Skin. No rashes Musculoskeletal: no joint deformities     Data Reviewed: I have personally reviewed following labs and imaging studies  CBC: Recent Labs  Lab 10/07/18 0434  WBC 2.0*  NEUTROABS 1.8  HGB 9.6*  HCT 29.1*  MCV 94.5  PLT 671   Basic Metabolic Panel: Recent Labs  Lab 10/06/18 0314 10/07/18 0434 10/08/18 0454 10/09/18 0610 10/10/18 0625  NA 148* 147* 144 140 137  K 4.1 4.3 4.5 5.1 5.4*  CL 121* 123* 119* 114* 114*  CO2 18* 19* 19* 19* 20*  GLUCOSE 127* 162* 160* 100* 127*  BUN 61* 50* 49* 43* 41*  CREATININE 1.86* 1.62* 1.38* 1.30* 1.19*  CALCIUM 8.0* 8.2* 7.8* 7.8* 7.4*   GFR: Estimated Creatinine Clearance: 39.4 mL/min (A) (by C-G formula based on SCr of 1.19 mg/dL (H)). Liver Function Tests: No results for input(s): AST, ALT, ALKPHOS, BILITOT, PROT, ALBUMIN in the last 168 hours. No results for  input(s): LIPASE, AMYLASE in the last 168 hours. No results for input(s): AMMONIA in the last 168 hours. Coagulation Profile: No results for input(s): INR, PROTIME in the last 168 hours. Cardiac Enzymes: No results for input(s): CKTOTAL, CKMB, CKMBINDEX, TROPONINI in the last 168 hours. BNP (last 3 results) No results for input(s): PROBNP in the last 8760 hours. HbA1C: No results for input(s): HGBA1C in the last 72 hours. CBG: Recent Labs  Lab 10/11/18 1645 10/11/18 2114 10/11/18 2344 10/12/18 0329 10/12/18 0750  GLUCAP 96 119* 113* 115* 119*   Lipid Profile: No results for input(s): CHOL, HDL, LDLCALC, TRIG, CHOLHDL, LDLDIRECT in the last 72 hours. Thyroid Function Tests: No results for input(s): TSH, T4TOTAL, FREET4, T3FREE, THYROIDAB in the last 72 hours. Anemia Panel: No results for input(s): VITAMINB12, FOLATE, FERRITIN, TIBC, IRON, RETICCTPCT in the last 72 hours.    Radiology Studies: I have reviewed all of the imaging during this hospital visit personally     Scheduled Meds: . budesonide (PULMICORT) nebulizer solution  0.25 mg Nebulization BID  . chlorhexidine  15 mL Mouth Rinse BID  . cloNIDine  0.3 mg Transdermal Weekly  . enoxaparin (LOVENOX) injection  40 mg Subcutaneous Q24H  . free water  250 mL Per Tube Q6H  . [name] butt cream   Topical BID  . ipratropium-albuterol  3 mL Nebulization BID  . levothyroxine  75 mcg Per NG tube Q0600  .  LORazepam  2 mg Intravenous Once  . mouth rinse  15 mL Mouth Rinse q12n4p  . metoprolol tartrate  25 mg Per Tube BID   Continuous Infusions: . feeding supplement (OSMOLITE 1.2 CAL) 1,000 mL (10/11/18 1225)     LOS: 18 days        Shannen Vernon Gerome Apley, MD

## 2018-10-13 LAB — GLUCOSE, CAPILLARY
GLUCOSE-CAPILLARY: 168 mg/dL — AB (ref 70–99)
Glucose-Capillary: 138 mg/dL — ABNORMAL HIGH (ref 70–99)
Glucose-Capillary: 100 mg/dL — ABNORMAL HIGH (ref 70–99)
Glucose-Capillary: 137 mg/dL — ABNORMAL HIGH (ref 70–99)
Glucose-Capillary: 155 mg/dL — ABNORMAL HIGH (ref 70–99)

## 2018-10-13 NOTE — Progress Notes (Addendum)
PROGRESS NOTE    Katie Mosley  VHQ:[id number] DOB: [birth year] DOA: 09/23/2018 PCP: Nolene Ebbs, MD    Brief Narrative:  78 year old female who presented with dyspnea and weakness. She does have significant past medical history for diastolic heart failure, chronic hyponatremia, chronic kidney disease stage III, chronic anemia, hypothyroidism and hypertension. Patient reported fatigue and dyspnea for about 7 days prior to hospitalization, associated with edema of her lower extremities, easy fatigability and generalized weakness. On hernitial physical examination blood pressure 191/67, heart rate 57, respiratoryrate13, oxygen saturation 98%.Her lungs had mild rales bilaterally, heart S1-S2 present and rhythmic, abdomen soft nontender, positive lower extremity edema. Chest x-ray had vascular congestion. EKG normal sinus rhythm, normal axis normal intervals.  Patient was admitted to the hospital working diagnosis acute on chronic diastolic heart failure.   09/27/2018.Patient developed worsening encephalopathy and was transferred to the intensive care unit. She was nonverbal, very confused and unable to answer questions.  10/01/2018.Transferred to [facility] stepdown unit for further neurological work-up. Encephalopathy likely multifactorial.Clinically improving, she continued to have swallow dysfunction, she is n.p.o., family had declined PEG tube.  Patient no able to eat dysphagia 1 diet, patient's family have decided not to proceed with PEG tube for now.    Assessment & Plan:   Principal Problem:   Sepsis ([place]) Active Problems:   Hyponatremia   Hypothyroidism   Congestive heart disease ([place])   AKI (acute kidney injury) ([place])   Acute encephalopathy   Normocytic anemia   Leukopenia   CKD (chronic kidney disease), stage IV (HCC)   DNR (do not resuscitate) discussion   Adult failure to thrive   Palliative care by specialist   Hypernatremia   Severe  swallowing dysfunction  1. Acute encephalopathy, toxic combined metabolic.patient has been tolerating tube feedings, continue to be not significantly awake and reactive to have sufficient po intake. Her son is at bedside and discussed with him need for more stable port of nutrition, peg tube. He will take a decision today at 16:00.   2. Right sided pneumonia. (present on admission).Continue aspiration precautions.    3.AKI on CKD stage 3, complicated with Hypernatremia. Checking renal panel every 48 H, will check metabolic panel in am, patient is tolerating well tube feedings and free water flushes, she has very poor oral intake and high risk for dehydration and electrolyte abnormalities with no established nutritional port.   5. Hypertension (urgency).tolerating well metoprolol, per ng tube. Continue clonidine patch.    6. Acute on chronic diastolic heart failure decompensation.Chronic and stable with no clinical signs of exacerbation.  7. Hypothyroid.Continue withlevothyroxine.   DVT prophylaxis:enoxaparin Code Status:full Family Communication:I spoke with patient's family at the bedside and all questions were addressed.   Disposition Plan/ discharge barriers:Plan to discharge home in 24 to 48 H.  Body mass index is 42.41 kg/m. Malnutrition Type:  Nutrition Problem: Inadequate oral intake Etiology: inability to eat   Malnutrition Characteristics:  Signs/Symptoms: NPO status   Nutrition Interventions:  Interventions: Refer to RD note for recommendations  RN Pressure Injury Documentation:     Consultants:     Procedures:     Antimicrobials:       Subjective: Patient continue to be hyporeactive, very poor oral intake, no apparent nausea and no vomiting, has been tolerating well tube feedings. No chest pain. Her son is at the bedside.   Objective: Vitals:   10/13/18 0355 10/13/18 0736 10/13/18 0816 10/13/18 0919  BP: (!) 140/48   (!) 164/54 (!) 124/53  Pulse: 60  60 64  Resp:   18   Temp: 98.2 F (36.8 C)     TempSrc: Oral     SpO2: 99% 98% 96%   Weight:      Height:        Intake/Output Summary (Last 24 hours) at 10/13/2018 1033 Last data filed at 10/13/2018 0558 Gross per 24 hour  Intake -  Output 600 ml  Net -600 ml   Filed Weights   10/09/18 0344 10/11/18 0500 10/12/18 0500  Weight: 54 kg 95.7 kg 95.3 kg    Examination:   General: deconditioned  Neurology: somnolent, opens eyes to voice with non focal  E ENT: positive pallor, no icterus, oral mucosa moist/ NG tube in place.  Cardiovascular: No JVD. S1-S2 present, rhythmic, no gallops, rubs, or murmurs. No lower extremity edema. Pulmonary: positive breath sounds bilaterally, poor inspiratory effort, no wheezing, rhonchi or rales. Gastrointestinal. Abdomen with no organomegaly, non tender, no rebound or guarding Skin. No rashes Musculoskeletal: no joint deformities     Data Reviewed: I have personally reviewed following labs and imaging studies  CBC: Recent Labs  Lab 10/07/18 0434  WBC 2.0*  NEUTROABS 1.8  HGB 9.6*  HCT 29.1*  MCV 94.5  PLT 381   Basic Metabolic Panel: Recent Labs  Lab 10/07/18 0434 10/08/18 0454 10/09/18 0610 10/10/18 0625 10/12/18 0812  NA 147* 144 140 137 133*  K 4.3 4.5 5.1 5.4* 5.4*  CL 123* 119* 114* 114* 107  CO2 19* 19* 19* 20* 18*  GLUCOSE 162* 160* 100* 127* 108*  BUN 50* 49* 43* 41* 29*  CREATININE 1.62* 1.38* 1.30* 1.19* 1.04*  CALCIUM 8.2* 7.8* 7.8* 7.4* 8.3*   GFR: Estimated Creatinine Clearance: 45 mL/min (A) (by C-G formula based on SCr of 1.04 mg/dL (H)). Liver Function Tests: No results for input(s): AST, ALT, ALKPHOS, BILITOT, PROT, ALBUMIN in the last 168 hours. No results for input(s): LIPASE, AMYLASE in the last 168 hours. No results for input(s): AMMONIA in the last 168 hours. Coagulation Profile: No results for input(s): INR, PROTIME in the last 168 hours. Cardiac Enzymes: No  results for input(s): CKTOTAL, CKMB, CKMBINDEX, TROPONINI in the last 168 hours. BNP (last 3 results) No results for input(s): PROBNP in the last 8760 hours. HbA1C: No results for input(s): HGBA1C in the last 72 hours. CBG: Recent Labs  Lab 10/12/18 1611 10/12/18 1958 10/12/18 2319 10/13/18 0359 10/13/18 0804  GLUCAP 116* 123* 120* 138* 100*   Lipid Profile: No results for input(s): CHOL, HDL, LDLCALC, TRIG, CHOLHDL, LDLDIRECT in the last 72 hours. Thyroid Function Tests: No results for input(s): TSH, T4TOTAL, FREET4, T3FREE, THYROIDAB in the last 72 hours. Anemia Panel: No results for input(s): VITAMINB12, FOLATE, FERRITIN, TIBC, IRON, RETICCTPCT in the last 72 hours.    Radiology Studies: I have reviewed all of the imaging during this hospital visit personally     Scheduled Meds: . budesonide (PULMICORT) nebulizer solution  0.25 mg Nebulization BID  . chlorhexidine  15 mL Mouth Rinse BID  . cloNIDine  0.3 mg Transdermal Weekly  . enoxaparin (LOVENOX) injection  40 mg Subcutaneous Q24H  . free water  250 mL Per Tube Q6H  . [name] butt cream   Topical BID  . ipratropium-albuterol  3 mL Nebulization BID  . levothyroxine  75 mcg Per NG tube Q0600  . LORazepam  2 mg Intravenous Once  . mouth rinse  15 mL Mouth Rinse q12n4p  . metoprolol tartrate  25 mg  Per Tube BID   Continuous Infusions: . feeding supplement (OSMOLITE 1.2 CAL) 45 mL/hr at 10/13/18 5830     LOS: 19 days        Mauricio Gerome Apley, MD

## 2018-10-13 NOTE — Progress Notes (Signed)
Family meeting today, decision was made to proceed with peg tube. All questions were addressed, will call IR for procedure.

## 2018-10-13 NOTE — Progress Notes (Signed)
Occupational Therapy Treatment Patient Details Name: Katie Mosley MRN: [id number] DOB: 11/18/1940 Today's Date: 10/13/2018    History of present illness 78 yo female admitted to ED on 1/28 for weakness and fatigue, with medical diagnosis of sepsis with possible PNA. PMH includes adrenal insufficiency, hypothyroid, CHF, HTN, hypothyroidism, dyspnea, CKDIII.    OT comments  Upon entering the room, pt supine in bed and son present in room. Focus of skilled OT intervention on family education in anticipation of discharge from hospital soon. OT continues to recommend SNF but family requesting to take pt home. OT educated caregiver on safety concerns and that pt's self care needs to be performed from bed level for pt and caregiver safety. I recommended he not try to transfer her or sit her on EOB due to increased safety concerns and he verbalized understanding. OT recommending hospital bed and OT educated caregiver on positioning bed and himself for good body mechanics. If possible self care and rolling to be performed with two people to be much easier and safer as well. Son did not agree to do hands on this session and insisted that he has a daughter who is a Marine scientist. However, many people involved in care need to be able to properly care for pt but he continued to decline hands on. Pt remained in bed at end of session with call bell and all needed items within reach.   Follow Up Recommendations  SNF;Supervision/Assistance - 24 hour    Equipment Recommendations  Hospital bed;Other (comment)(family may need hoyer lift)    Recommendations for Other Services      Precautions / Restrictions Precautions Precautions: Fall Precaution Comments: pt is [place], family translates Required Braces or Orthoses: Other Brace Other Brace: L PRAFO Restrictions Weight Bearing Restrictions: No       Mobility Bed Mobility Overal bed mobility: Needs Assistance Bed Mobility: Rolling;Sidelying to Sit Rolling:  Max assist;+2 for physical assistance                   ADL either performed or assessed with clinical judgement        Vision   Vision Assessment?: Vision impaired- to be further tested in functional context          Cognition Arousal/Alertness: Awake/alert Behavior During Therapy: Flat affect Overall Cognitive Status: Impaired/Different from baseline Area of Impairment: Following commands;Problem solving                 Orientation Level: Person;Place;Time;Situation   Memory: Decreased short-term memory Following Commands: Follows one step commands inconsistently;Follows one step commands with increased time Safety/Judgement: Decreased awareness of safety;Decreased awareness of deficits   Problem Solving: Requires tactile cues;Difficulty sequencing;Decreased initiation;Slow processing;Requires verbal cues                     Pertinent Vitals/ Pain       Pain Assessment: Faces Faces Pain Scale: Hurts a little bit Pain Location: back Pain Descriptors / Indicators: Discomfort Pain Intervention(s): Monitored during session;Repositioned         Frequency  Min 2X/week        Progress Toward Goals  OT Goals(current goals can now be found in the care plan section)  Progress towards OT goals: Progressing toward goals  Acute Rehab OT Goals Patient Stated Goal: none stated this session  Plan Discharge plan remains appropriate;Frequency remains appropriate       AM-PAC OT "6 Clicks" Daily Activity     Outcome Measure   Help  from another person eating meals?: Total Help from another person taking care of personal grooming?: Total Help from another person toileting, which includes using toliet, bedpan, or urinal?: Total Help from another person bathing (including washing, rinsing, drying)?: Total Help from another person to put on and taking off regular upper body clothing?: Total Help from another person to put on and taking off regular lower  body clothing?: Total 6 Click Score: 6    End of Session    OT Visit Diagnosis: Muscle weakness (generalized) (M62.81)   Activity Tolerance Patient tolerated treatment well   Patient Left in bed;with call bell/phone within reach;with family/visitor present   Nurse Communication Other (comment)(discharge plan)        Time: 7616-0737 OT Time Calculation (min): 12 min  Charges: OT General Charges $OT Visit: 1 Visit OT Treatments $Self Care/Home Management : 8-22 mins   Calisha Tindel P, MS, OTR/L 10/13/2018, 10:26 AM

## 2018-10-13 NOTE — Progress Notes (Signed)
Physical Therapy Treatment Patient Details Name: Katie Mosley MRN: [id number] DOB: 12-Apr-1941 Today's Date: 10/13/2018    History of Present Illness 78 yo female admitted to ED on 1/28 for weakness and fatigue, with medical diagnosis of sepsis with possible PNA. PMH includes adrenal insufficiency, hypothyroid, CHF, HTN, hypothyroidism, dyspnea, CKDIII.     PT Comments    Pt performed PROM and active assisted ROM in supine.  She was limited in her participation and wax and wain during session.  Pt positioned into chair position to improve lung function and tolerance to sitting.  Pt with audible rattle when breathing.  She presents with significant decline in function from previous session.  Continue to recommend SNF placement, family continues to refuse placement.     Follow Up Recommendations  SNF(Family refusing SNF placement)     Equipment Recommendations  None recommended by PT    Recommendations for Other Services       Precautions / Restrictions Precautions Precautions: Fall Precaution Comments: pt is [place], family translates Restrictions Weight Bearing Restrictions: No    Mobility  Bed Mobility Overal bed mobility: Needs Assistance Bed Mobility: Rolling Rolling: Max assist;+2 for physical assistance         General bed mobility comments: Rolling to L and R to adjust bed pad.  Positioned in supine with scoot to [facility] with total +2 assistance.  Pt positioned in chair position to improve lung function and promote airway clearance.  She was limited in her ability to follow commands with son present.   Transfers                 General transfer comment: Deferred due to lethargy and innability to follow commands  Ambulation/Gait                 Stairs             Wheelchair Mobility    Modified Rankin (Stroke Patients Only)       Balance                                            Cognition Arousal/Alertness:  Awake/alert Behavior During Therapy: Restless Overall Cognitive Status: Difficult to assess                                        Exercises General Exercises - Lower Extremity Ankle Circles/Pumps: PROM;Both;10 reps;Supine;AROM(1x10 AROM when able to rouse and 1x10 passive when not following commands) Quad Sets: (attempted but unable to follow commands) Short Arc Quad: AAROM;Both;10 reps;Supine Heel Slides: PROM;Both;10 reps;Supine Hip ABduction/ADduction: PROM;Both;10 reps;Supine Shoulder Exercises Shoulder Flexion: AAROM;10 reps;Supine;Both Elbow Flexion: AAROM;PROM;Both;10 reps    General Comments        Pertinent Vitals/Pain Pain Assessment: Faces Pain Score: 0-No pain    Home Living                      Prior Function            PT Goals (current goals can now be found in the care plan section) Acute Rehab PT Goals Patient Stated Goal: none stated this session Potential to Achieve Goals: Poor Progress towards PT goals: Not progressing toward goals - comment(decline in alertness and function.  )    Frequency  Min 2X/week      PT Plan Current plan remains appropriate    Co-evaluation              AM-PAC PT "6 Clicks" Mobility   Outcome Measure  Help needed turning from your back to your side while in a flat bed without using bedrails?: Total Help needed moving from lying on your back to sitting on the side of a flat bed without using bedrails?: Total Help needed moving to and from a bed to a chair (including a wheelchair)?: Total Help needed standing up from a chair using your arms (e.g., wheelchair or bedside chair)?: Total Help needed to walk in hospital room?: Total Help needed climbing 3-5 steps with a railing? : Total 6 Click Score: 6    End of Session   Activity Tolerance: Patient limited by lethargy Patient left: in bed;with call bell/phone within reach Nurse Communication: Mobility status(informed nursing to  reset bed alarm) PT Visit Diagnosis: Other abnormalities of gait and mobility (R26.89);Difficulty in walking, not elsewhere classified (R26.2)     Time: 4462-8638 PT Time Calculation (min) (ACUTE ONLY): 20 min  Charges:  $Therapeutic Exercise: 8-22 mins                     Governor Rooks, PTA Acute Rehabilitation Services Pager [phone] Office [phone]     Hendrix Console Eli Hose 10/13/2018, 4:04 PM

## 2018-10-13 NOTE — Progress Notes (Signed)
Pt noted to has thrush on her tongue; MD left a sticky note to address. Delia Heady RN

## 2018-10-14 LAB — BASIC METABOLIC PANEL
ANION GAP: 7 (ref 5–15)
BUN: 28 mg/dL — ABNORMAL HIGH (ref 8–23)
CO2: 20 mmol/L — AB (ref 22–32)
Calcium: 8.3 mg/dL — ABNORMAL LOW (ref 8.9–10.3)
Chloride: 101 mmol/L (ref 98–111)
Creatinine, Ser: 1.26 mg/dL — ABNORMAL HIGH (ref 0.44–1.00)
GFR calc Af Amer: 47 mL/min — ABNORMAL LOW (ref >60)
GFR calc non Af Amer: 41 mL/min — ABNORMAL LOW (ref >60)
Glucose, Bld: 124 mg/dL — ABNORMAL HIGH (ref 70–99)
Potassium: 5.4 mmol/L — ABNORMAL HIGH (ref 3.5–5.1)
Sodium: 128 mmol/L — ABNORMAL LOW (ref 135–145)

## 2018-10-14 LAB — GLUCOSE, CAPILLARY
GLUCOSE-CAPILLARY: 118 mg/dL — AB (ref 70–99)
Glucose-Capillary: 118 mg/dL — ABNORMAL HIGH (ref 70–99)
Glucose-Capillary: 121 mg/dL — ABNORMAL HIGH (ref 70–99)
Glucose-Capillary: 130 mg/dL — ABNORMAL HIGH (ref 70–99)
Glucose-Capillary: 143 mg/dL — ABNORMAL HIGH (ref 70–99)
Glucose-Capillary: 96 mg/dL (ref 70–99)

## 2018-10-14 MED ORDER — FREE WATER
250.0000 mL | Freq: Three times a day (TID) | Status: DC
  Administered 2018-10-14 – 2018-10-16 (×2): 250 mL

## 2018-10-14 NOTE — Progress Notes (Signed)
PROGRESS NOTE    Katie Mosley  TFT:[id number] DOB: 09-10-1940 DOA: 09/23/2018 PCP: Nolene Ebbs, MD    Brief Narrative:  78 year old female who presented with dyspnea and weakness. She does have significant past medical history for diastolic heart failure, chronic hyponatremia, chronic kidney disease stage III, chronic anemia, hypothyroidism and hypertension. Patient reported fatigue and dyspnea for about 7 days prior to hospitalization, associated with edema of her lower extremities, easy fatigability and generalized weakness. On hernitial physical examination blood pressure 191/67, heart rate 57, respiratoryrate13, oxygen saturation 98%.Her lungs had mild rales bilaterally, heart S1-S2 present and rhythmic, abdomen soft nontender, positive lower extremity edema. Chest x-ray had vascular congestion. EKG normal sinus rhythm, normal axis normal intervals.  Patient was admitted to the hospital working diagnosis acute on chronic diastolic heart failure.   09/27/2018.Patient developed worsening encephalopathy and was transferred to the intensive care unit. She was nonverbal, very confused and unable to answer questions.  10/01/2018.Transferred to [facility] stepdown unit for further neurological work-up. Encephalopathy likely multifactorial.Clinically improving, she continued to have swallow dysfunction, she is n.p.o., family had declined PEG tube.  Patient no able to eat dysphagia 1 diet, patient's family have decided to proceed with PEG tube.    Assessment & Plan:   Principal Problem:   Sepsis ([place]) Active Problems:   Hyponatremia   Hypothyroidism   Congestive heart disease ([place])   AKI (acute kidney injury) ([place])   Acute encephalopathy   Normocytic anemia   Leukopenia   CKD (chronic kidney disease), stage IV (HCC)   DNR (do not resuscitate) discussion   Adult failure to thrive   Palliative care by specialist   Hypernatremia   Severe swallowing  dysfunction   1. Acute encephalopathy, toxic combined metabolic, complicated with swallow dysfunction.Patient continue tolerating tube feedings. IR has been consulted for peg tube, but family is concerned about the length of the physical feeding tube. I spoke with them about the importance of using the less invasive procedure as a first option. I spoke with GI on call, and procedure was deferred to IR. Will have IR re consult.   2. Right sided pneumonia. (present on admission).On aspiration precautions.   3.AKI on CKD stage 3, complicated with Hypernatremia. stable renal function with serum cr 1,2, with K at 5,4. Na now down to 128, will decrease free water flushes and will follow on renal panel in am. Patient has very poor oral intake and hight risk for dehydration and electrolyte abnormalities without a secure enteral port.   5. Hypertension (urgency).Continue with metoprolol and clonidine patch.    6. Acute on chronic diastolic heart failure decompensation.Has remained stable with no clinical signs of acute exacerbation.  7. Hypothyroid.On levothyroxine.   DVT prophylaxis:enoxaparin Code Status:full Family Communication:I spoke with patient's family at the bedside and all questions were addressed.   Disposition Plan/ discharge barriers:Plan to discharge home in 24 to 48 H.  Body mass index is 42.41 kg/m. Malnutrition Type:  Nutrition Problem: Inadequate oral intake Etiology: inability to eat   Malnutrition Characteristics:  Signs/Symptoms: NPO status   Nutrition Interventions:  Interventions: Refer to RD note for recommendations  RN Pressure Injury Documentation:     Consultants:   IR  Procedures:     Antimicrobials:       Subjective: Patient is awake, very weak and deconditioned, no apparent nausea or dyspnea, has been tolerating tube feedings.   Objective: Vitals:   10/14/18 0335 10/14/18 0721 10/14/18 0742 10/14/18 1130   BP: (!) 174/61  Marland Kitchen)  185/71 (!) 173/106  Pulse: 70  80 85  Resp: 18  18 19   Temp: 98.2 F (36.8 C)  99.2 F (37.3 C) 98.2 F (36.8 C)  TempSrc: Oral  Oral Axillary  SpO2: 100% 97% 97% 95%  Weight:      Height:        Intake/Output Summary (Last 24 hours) at 10/14/2018 1243 Last data filed at 10/14/2018 9371 Gross per 24 hour  Intake 5222.08 ml  Output 300 ml  Net 4922.08 ml   Filed Weights   10/09/18 0344 10/11/18 0500 10/12/18 0500  Weight: 54 kg 95.7 kg 95.3 kg    Examination:   General: Not in pain or dyspnea, deconditioned and ill looking appearing.  Neurology: Awake and alert, non focal  E ENT: positive pallor, no icterus, oral mucosa moist Cardiovascular: No JVD. S1-S2 present, rhythmic, no gallops, rubs, or murmurs. No lower extremity edema. Pulmonary: positive breath sounds bilaterally, positive wheezing, scattered rhonchi and rales, on anterior auscultation.  Gastrointestinal. Abdomen with no organomegaly, non tender, no rebound or guarding Skin. No rashes Musculoskeletal: no joint deformities     Data Reviewed: I have personally reviewed following labs and imaging studies  CBC: No results for input(s): WBC, NEUTROABS, HGB, HCT, MCV, PLT in the last 168 hours. Basic Metabolic Panel: Recent Labs  Lab 10/08/18 0454 10/09/18 0610 10/10/18 0625 10/12/18 0812 10/14/18 0449  NA 144 140 137 133* 128*  K 4.5 5.1 5.4* 5.4* 5.4*  CL 119* 114* 114* 107 101  CO2 19* 19* 20* 18* 20*  GLUCOSE 160* 100* 127* 108* 124*  BUN 49* 43* 41* 29* 28*  CREATININE 1.38* 1.30* 1.19* 1.04* 1.26*  CALCIUM 7.8* 7.8* 7.4* 8.3* 8.3*   GFR: Estimated Creatinine Clearance: 37.2 mL/min (A) (by C-G formula based on SCr of 1.26 mg/dL (H)). Liver Function Tests: No results for input(s): AST, ALT, ALKPHOS, BILITOT, PROT, ALBUMIN in the last 168 hours. No results for input(s): LIPASE, AMYLASE in the last 168 hours. No results for input(s): AMMONIA in the last 168 hours. Coagulation  Profile: No results for input(s): INR, PROTIME in the last 168 hours. Cardiac Enzymes: No results for input(s): CKTOTAL, CKMB, CKMBINDEX, TROPONINI in the last 168 hours. BNP (last 3 results) No results for input(s): PROBNP in the last 8760 hours. HbA1C: No results for input(s): HGBA1C in the last 72 hours. CBG: Recent Labs  Lab 10/13/18 1951 10/14/18 0009 10/14/18 0409 10/14/18 0740 10/14/18 1115  GLUCAP 155* 143* 118* 130* 118*   Lipid Profile: No results for input(s): CHOL, HDL, LDLCALC, TRIG, CHOLHDL, LDLDIRECT in the last 72 hours. Thyroid Function Tests: No results for input(s): TSH, T4TOTAL, FREET4, T3FREE, THYROIDAB in the last 72 hours. Anemia Panel: No results for input(s): VITAMINB12, FOLATE, FERRITIN, TIBC, IRON, RETICCTPCT in the last 72 hours.    Radiology Studies: I have reviewed all of the imaging during this hospital visit personally     Scheduled Meds: . budesonide (PULMICORT) nebulizer solution  0.25 mg Nebulization BID  . chlorhexidine  15 mL Mouth Rinse BID  . cloNIDine  0.3 mg Transdermal Weekly  . enoxaparin (LOVENOX) injection  40 mg Subcutaneous Q24H  . free water  250 mL Per Tube Q6H  . levothyroxine  75 mcg Per NG tube Q0600  . LORazepam  2 mg Intravenous Once  . mouth rinse  15 mL Mouth Rinse q12n4p  . metoprolol tartrate  25 mg Per Tube BID   Continuous Infusions: . feeding supplement (OSMOLITE 1.2 CAL) 1,000  mL (10/13/18 2222)     LOS: 20 days        Jahmar Mckelvy Gerome Apley, MD

## 2018-10-14 NOTE — Care Management Important Message (Signed)
Important Message  Patient Details  Name: Katie Mosley MRN: [id number] Date of Birth: August 21, 1941   Medicare Important Message Given:  Yes    Hamed Debella P Bono 10/14/2018, 12:17 PM

## 2018-10-14 NOTE — Progress Notes (Signed)
I spoke with patient's family at the bedside about PEG tube, benefits of a less invasive procedure through interventional radiology.  I did communicate to them that the gastrointestinal team prefers noninvasive approach by IR.  Family was hoping to have a shorter tube in place but considering the benefits of a less invasive procedure they agreed to proceed with current PEG tube plans.  Interventional radiology has been notified.

## 2018-10-14 NOTE — Progress Notes (Signed)
Nutrition Follow-up  INTERVENTION:  Continue Osmolite 1.2 formula via Cortrak NGT at goal rate of 55 ml/hr.   Continue free water flushes of 250 ml q 8 hours. (MD to adjust)  Tube feeding regimen to provide 1584 kcal, 73 grams of protein, and 1832 ml water.  Once PEG is placed and ready for use,  Recommend restarting regimen using Osmolite 1.2 formula at 35 ml/hr and increase by 10 ml every 4 hours to goal rate of 55 ml/hr.   NUTRITION DIAGNOSIS:   Inadequate oral intake related to inability to eat as evidenced by NPO status; diet advanced; ongoing  GOAL:   Patient will meet greater than or equal to 90% of their needs; met with TF  MONITOR:   PO intake, TF tolerance, Labs, Skin, Weight trends, I & O's  REASON FOR ASSESSMENT:   Consult Assessment of nutrition requirement/status, Enteral/tube feeding initiation and management  ASSESSMENT:   78 y.o. female with history of CHF with grade 1 diastolic dysfunction, chronic hyponatremia, CKD stage III, chronic anemia, hypothyroidism, and HTN. She presented to the ED with complaints of fatigue and SOB for the past 1 week. Patient also noticed increasing swelling in the lower extremities. Has had some productive cough.  Patient also has been feeling weak and fatigued easily.   Diet has been advanced to a dysphagia 1 diet with nectar thick liquids. Family at bedside reports pt has been refusing PO. MD reports pt with oral thrush. Medication to aid in oral thrush in place. Pt has been tolerating her tube feeds well. Family has decided on proceeding with PEG placement for pt. IR has been consulted. Recommend continuation of current tube feeding orders. Labs and medications reviewed.   Diet Order:   Diet Order            DIET - DYS 1 Room service appropriate? Yes; Fluid consistency: Nectar Thick  Diet effective now              EDUCATION NEEDS:   Not appropriate for education at this time  Skin:  Skin Assessment: Skin Integrity  Issues: Skin Integrity Issues:: Other (Comment) Other: non-pressure wound to buttocks  Last BM:  2/16  Height:   Ht Readings from Last 1 Encounters:  09/23/18 4' 11"  (1.499 m)    Weight:   Wt Readings from Last 1 Encounters:  10/12/18 95.3 kg    Ideal Body Weight:  42.91 kg  BMI:  Body mass index is 42.41 kg/m.  Estimated Nutritional Needs:   Kcal:  1560-1820 kcal  Protein:  65-75 grams  Fluid:  >/= 1.6 L/day    Corrin Parker, MS, RD, LDN Pager # [phone] After hours/ weekend pager # [phone]

## 2018-10-14 NOTE — Progress Notes (Signed)
SLP Cancellation Note  Patient Details Name: Katie Mosley MRN: [id number] DOB: 08/14/1941   Cancelled treatment:       Reason Eval/Treat Not Completed: Other (comment) Pt's son who typically translates for pt is not at bedside and the family member at bedside reported that her English "is not good" and requested that SLP follow up when the pt's son returns. SLP will follow up as able.    Horton Marshall 10/14/2018, 5:21 PM

## 2018-10-14 NOTE — Progress Notes (Signed)
Patient approved for gastrostomy tube placement - presented to room for consultation today, spoke with family member at bedside as well as another family member on the phone. They voiced numerous concerns regarding the procedure for a pull through gastrostomy tube including that it needs to be pulled through her mouth, that the tube will be sticking out of her abdomen, the thickness/length of the tube and that she may pull the tube out.  They requested that button feeding tube be placed as they wish to not have any tubes protruding from her body due to concern for care as well as patient pulling at tubes. Discussed that we do not typically place button tubes as the initial feeding tube and that she would require a typical pull through g-tube initially which can then be replaced for a button feeding tube in the future. Suggested possibly using an abdominal binder after g-tube placement if pulling at the tube is a concern. Family also questioning if there is a difference between PEG vs pull through gastrostomy - explained that PEG tube is typically the same general type of tube however it is placed with endoscopic guidance. They are interested in the option of endoscopically placed g-tube but are concerned about the level of sedation that would be used - they are very adamant that they do not want her put completely to sleep for any procedure.   Family member is requesting that GI be contacted for possible button tube placement - I explained that I do not believe that these are placed as the initial feeding tube by any service, however I am happy to discuss this with patient's primary doctor. Spoke with Dr. Cathlean Sauer via phone regarding above and he will follow up with family/consult GI if he feels this is necessary.   If family decides on pull through g-tube and wishes for this to be placed in IR we may be able to place afternoon of 2/19 or sometime on 2/20. No consent was signed today.   Will continue to  follow.   Candiss Norse, PA-C

## 2018-10-15 ENCOUNTER — Inpatient Hospital Stay (HOSPITAL_COMMUNITY): Payer: Medicare Other

## 2018-10-15 ENCOUNTER — Encounter (HOSPITAL_COMMUNITY): Payer: Self-pay | Admitting: Interventional Radiology

## 2018-10-15 HISTORY — PX: IR GASTROSTOMY TUBE MOD SED: IMG625

## 2018-10-15 LAB — CBC
HCT: 22.6 % — ABNORMAL LOW (ref 36.0–46.0)
Hemoglobin: 7.4 g/dL — ABNORMAL LOW (ref 12.0–15.0)
MCH: 30.5 pg (ref 26.0–34.0)
MCHC: 32.7 g/dL (ref 30.0–36.0)
MCV: 93 fL (ref 80.0–100.0)
Platelets: 249 10*3/uL (ref 150–400)
RBC: 2.43 MIL/uL — ABNORMAL LOW (ref 3.87–5.11)
RDW: 16.4 % — AB (ref 11.5–15.5)
WBC: 3.1 10*3/uL — ABNORMAL LOW (ref 4.0–10.5)
nRBC: 0 % (ref 0.0–0.2)

## 2018-10-15 LAB — BASIC METABOLIC PANEL
Anion gap: 5 (ref 5–15)
BUN: 29 mg/dL — ABNORMAL HIGH (ref 8–23)
CO2: 24 mmol/L (ref 22–32)
Calcium: 8.1 mg/dL — ABNORMAL LOW (ref 8.9–10.3)
Chloride: 101 mmol/L (ref 98–111)
Creatinine, Ser: 1.23 mg/dL — ABNORMAL HIGH (ref 0.44–1.00)
GFR calc Af Amer: 49 mL/min — ABNORMAL LOW (ref >60)
GFR calc non Af Amer: 42 mL/min — ABNORMAL LOW (ref >60)
Glucose, Bld: 84 mg/dL (ref 70–99)
Potassium: 5.5 mmol/L — ABNORMAL HIGH (ref 3.5–5.1)
SODIUM: 130 mmol/L — AB (ref 135–145)

## 2018-10-15 LAB — GLUCOSE, CAPILLARY
Glucose-Capillary: 103 mg/dL — ABNORMAL HIGH (ref 70–99)
Glucose-Capillary: 109 mg/dL — ABNORMAL HIGH (ref 70–99)
Glucose-Capillary: 80 mg/dL (ref 70–99)
Glucose-Capillary: 95 mg/dL (ref 70–99)
Glucose-Capillary: 97 mg/dL (ref 70–99)
Glucose-Capillary: 99 mg/dL (ref 70–99)

## 2018-10-15 LAB — PROTIME-INR
INR: 1
Prothrombin Time: 13.1 seconds (ref 11.4–15.2)

## 2018-10-15 MED ORDER — IOPAMIDOL (ISOVUE-300) INJECTION 61%
INTRAVENOUS | Status: AC
  Administered 2018-10-15: 30 mL
  Filled 2018-10-15: qty 50

## 2018-10-15 MED ORDER — LIDOCAINE HCL 1 % IJ SOLN
INTRAMUSCULAR | Status: AC
  Administered 2018-10-15: 15:00:00
  Filled 2018-10-15: qty 20

## 2018-10-15 MED ORDER — GLUCAGON HCL RDNA (DIAGNOSTIC) 1 MG IJ SOLR
INTRAMUSCULAR | Status: AC
  Administered 2018-10-15: 15:00:00
  Filled 2018-10-15: qty 1

## 2018-10-15 MED ORDER — FENTANYL CITRATE (PF) 100 MCG/2ML IJ SOLN
INTRAMUSCULAR | Status: AC
  Administered 2018-10-15: 15:00:00
  Filled 2018-10-15: qty 2

## 2018-10-15 MED ORDER — FENTANYL CITRATE (PF) 100 MCG/2ML IJ SOLN
INTRAMUSCULAR | Status: AC | PRN
  Administered 2018-10-15: 50 ug via INTRAVENOUS

## 2018-10-15 MED ORDER — ONDANSETRON HCL 4 MG/2ML IJ SOLN
INTRAMUSCULAR | Status: AC | PRN
  Administered 2018-10-15: 4 mg via INTRAVENOUS

## 2018-10-15 MED ORDER — SODIUM POLYSTYRENE SULFONATE 15 GM/60ML PO SUSP
15.0000 g | Freq: Once | ORAL | Status: DC
  Filled 2018-10-15: qty 60

## 2018-10-15 MED ORDER — DEXTROSE 10 % IV SOLN
INTRAVENOUS | Status: DC
  Administered 2018-10-15: 05:00:00 via INTRAVENOUS

## 2018-10-15 MED ORDER — LIDOCAINE HCL (PF) 1 % IJ SOLN
INTRAMUSCULAR | Status: AC | PRN
  Administered 2018-10-15: 5 mL

## 2018-10-15 MED ORDER — CEFAZOLIN SODIUM-DEXTROSE 1-4 GM/50ML-% IV SOLN
INTRAVENOUS | Status: AC | PRN
  Administered 2018-10-15: 2 g via INTRAVENOUS

## 2018-10-15 MED ORDER — MIDAZOLAM HCL 2 MG/2ML IJ SOLN
INTRAMUSCULAR | Status: AC | PRN
  Administered 2018-10-15: 2 mg via INTRAVENOUS

## 2018-10-15 MED ORDER — ONDANSETRON HCL 4 MG/2ML IJ SOLN
INTRAMUSCULAR | Status: AC
  Administered 2018-10-15: 15:00:00
  Filled 2018-10-15: qty 2

## 2018-10-15 MED ORDER — MIDAZOLAM HCL 2 MG/2ML IJ SOLN
INTRAMUSCULAR | Status: AC
  Administered 2018-10-15: 15:00:00
  Filled 2018-10-15: qty 2

## 2018-10-15 MED ORDER — ENOXAPARIN SODIUM 40 MG/0.4ML ~~LOC~~ SOLN
40.0000 mg | SUBCUTANEOUS | Status: DC
  Administered 2018-10-16 – 2018-10-17 (×2): 40 mg via SUBCUTANEOUS
  Filled 2018-10-15 (×2): qty 0.4

## 2018-10-15 MED ORDER — CEFAZOLIN SODIUM-DEXTROSE 2-4 GM/100ML-% IV SOLN
INTRAVENOUS | Status: AC
  Administered 2018-10-15: 15:00:00
  Filled 2018-10-15: qty 100

## 2018-10-15 NOTE — Progress Notes (Addendum)
[facility]  PROGRESS NOTE  Katie Mosley PPI:[id number] DOB: Jan 21, 1941 DOA: 09/23/2018 PCP: Nolene Ebbs, MD   Brief HPI:   78 year old female with a history diastolic heart failure, chronic hyponatremia, CKD stage III, chronic anemia, hypothyroidism, hypertension presents with dyspnea and weakness.Patient developed worsening encephalopathy, and was transferred to the ICU.  She was nonverbal, confused and unable to answer questions. 10/01/2018-patient was transferred to [facility] stepdown unit for further neurological work-up.  Encephalopathy was likely multifactorial, clinically she is improved, continue to have dysphagia initially family declined PEG tube but now they are okay to proceed with PEG tube.  Plan for PEG tube placement today per IR.    Subjective   Patient seen and examined, continues to be lethargic, opens eyes to verbal stimuli.   Assessment/Plan:     1. Metabolic encephalopathy-slowly improving, unclear etiology.  Likely from right-sided pneumonia.Neurology was consulted, EEG and LP were negative.  Continue supportive care.  2. Dysphagia- NG tube feeding was started, family has agreed for PEG tube placement.  PEG tube placement per IR.  3. Acute kidney injury on CKD stage III-  Creatinine stable at 1.23.  4. Hypertension-continue metoprolol, clonidine patch.  5. Acute on chronic diastolic CHF-no signs of acute exacerbation.  6. Hypothyroidism-continue Synthroid  7. Hyperkalemia-potassium is 5.5, will give 1 dose of Kayexalate 15 g p.o. x1.  Follow BMP in a.m.     CBG: Recent Labs  Lab 10/14/18 1604 10/14/18 2327 10/15/18 0316 10/15/18 0737 10/15/18 1134  GLUCAP 121* 96 80 95 109*    CBC: Recent Labs  Lab 10/15/18 0712  WBC 3.1*  HGB 7.4*  HCT 22.6*  MCV 93.0  PLT 063    Basic Metabolic Panel: Recent Labs  Lab 10/09/18 0610 10/10/18 0625 10/12/18 0812 10/14/18 0449 10/15/18 0407  NA 140 137 133* 128* 130*  K 5.1 5.4* 5.4*  5.4* 5.5*  CL 114* 114* 107 101 101  CO2 19* 20* 18* 20* 24  GLUCOSE 100* 127* 108* 124* 84  BUN 43* 41* 29* 28* 29*  CREATININE 1.30* 1.19* 1.04* 1.26* 1.23*  CALCIUM 7.8* 7.4* 8.3* 8.3* 8.1*     DVT prophylaxis: Lovenox  Code Status: Full code  Family Communication: No family at bedside  Disposition Plan: likely home when medically ready for discharge     Consultants:  IR  Procedures:     Antibiotics:   Anti-infectives (From admission, onward)   Start     Dose/Rate Route Frequency Ordered Stop   10/15/18 1400  ceFAZolin (ANCEF) IVPB 1 g/50 mL premix     over 30 Minutes  Continuous PRN 10/15/18 1405 10/15/18 1400   10/15/18 1358  ceFAZolin (ANCEF) 2-4 GM/100ML-% IVPB    Note to Pharmacy:  Manuela Neptune   : cabinet override      10/15/18 1358 10/15/18 1510   10/01/18 2100  vancomycin (VANCOCIN) IVPB 750 mg/150 ml premix  Status:  Discontinued     750 mg 150 mL/hr over 60 Minutes Intravenous Every 48 hours 10/01/18 1959 10/02/18 1224   09/30/18 1431  vancomycin variable dose per unstable renal function (pharmacist dosing)  Status:  Discontinued      Does not apply See admin instructions 09/30/18 1431 10/01/18 1959   09/30/18 1430  acyclovir (ZOVIRAX) 500 mg in dextrose 5 % 100 mL IVPB  Status:  Discontinued     500 mg 110 mL/hr over 60 Minutes Intravenous Every 24 hours 09/30/18 1413 10/02/18 1224   09/30/18 1415  vancomycin (VANCOCIN)  IVPB 1000 mg/200 mL premix     1,000 mg 200 mL/hr over 60 Minutes Intravenous  Once 09/30/18 1413 09/30/18 1834   09/30/18 1400  cefTRIAXone (ROCEPHIN) 2 g in sodium chloride 0.9 % 100 mL IVPB  Status:  Discontinued     2 g 200 mL/hr over 30 Minutes Intravenous Every 12 hours 09/30/18 1325 10/02/18 1224   09/30/18 1400  ampicillin (OMNIPEN) 2 g in sodium chloride 0.9 % 100 mL IVPB  Status:  Discontinued     2 g 300 mL/hr over 20 Minutes Intravenous Every 6 hours 09/30/18 1325 10/02/18 1224   09/25/18 1800  ceFEPIme (MAXIPIME) 2  g in sodium chloride 0.9 % 100 mL IVPB  Status:  Discontinued     2 g 200 mL/hr over 30 Minutes Intravenous Every 24 hours 09/25/18 1239 09/29/18 0801   09/25/18 1230  ceFEPIme (MAXIPIME) 2 g in sodium chloride 0.9 % 100 mL IVPB  Status:  Discontinued     2 g 200 mL/hr over 30 Minutes Intravenous Every 24 hours 09/25/18 1219 09/25/18 1238   09/25/18 0600  ceFEPIme (MAXIPIME) 2 g in sodium chloride 0.9 % 100 mL IVPB  Status:  Discontinued     2 g 200 mL/hr over 30 Minutes Intravenous Every 12 hours 09/24/18 1634 09/25/18 1219   09/24/18 1645  ceFEPIme (MAXIPIME) 2 g in sodium chloride 0.9 % 100 mL IVPB     2 g 200 mL/hr over 30 Minutes Intravenous STAT 09/24/18 1634 09/24/18 2000   09/24/18 1630  meropenem (MERREM) 1 g in sodium chloride 0.9 % 100 mL IVPB  Status:  Discontinued     1 g 200 mL/hr over 30 Minutes Intravenous Every 12 hours 09/24/18 1523 09/24/18 1630       Objective   Vitals:   10/15/18 1430 10/15/18 1435 10/15/18 1440 10/15/18 1450  BP: 117/63 (!) 156/88 (!) 151/68 (!) 141/73  Pulse: 71 74 73 72  Resp: 12 12 12 13   Temp:      TempSrc:      SpO2: 96% 100% 99% 96%  Weight:      Height:        Intake/Output Summary (Last 24 hours) at 10/15/2018 1647 Last data filed at 10/15/2018 0504 Gross per 24 hour  Intake 1319.25 ml  Output 800 ml  Net 519.25 ml   Filed Weights   10/09/18 0344 10/11/18 0500 10/12/18 0500  Weight: 54 kg 95.7 kg 95.3 kg     Physical Examination:    General: Appears in no acute distress  Cardiovascular: S1-S2, regular, no murmur auscultated  Respiratory: Clear to auscultation  Abdomen: Abdomen is soft, nontender, no organomegaly  Extremities: No edema in the lower extremities  Neurologic: Somnolent but arousable, not oriented x3.     Data Reviewed: I have personally reviewed following labs and imaging studies   No results found for this or any previous visit (from the past 240 hour(s)).   Liver Function Tests: No  results for input(s): AST, ALT, ALKPHOS, BILITOT, PROT, ALBUMIN in the last 168 hours. No results for input(s): LIPASE, AMYLASE in the last 168 hours. No results for input(s): AMMONIA in the last 168 hours.  Cardiac Enzymes: No results for input(s): CKTOTAL, CKMB, CKMBINDEX, TROPONINI in the last 168 hours. BNP (last 3 results) Recent Labs    09/24/18 1513 09/27/18 1017  BNP 1,030.5* 2,549.5*    ProBNP (last 3 results) No results for input(s): PROBNP in the last 8760 hours.    Studies:  Ir Gastrostomy Tube Mod Sed  Result Date: 10/15/2018 CLINICAL DATA:  Encephalopathy, poor oral intake and need for percutaneous gastrostomy tube for nutrition. EXAM: PERCUTANEOUS GASTROSTOMY TUBE PLACEMENT ANESTHESIA/SEDATION: 1.0 mg IV Versed; 50 mcg IV Fentanyl. Total Moderate Sedation Time 30 minutes. The patient's level of consciousness and physiologic status were continuously monitored during the procedure by Radiology nursing. CONTRAST:  20 mL Isovue-300 MEDICATIONS: 2 g IV Ancef. IV antibiotic was administered in an appropriate time interval prior to needle puncture of the skin. FLUOROSCOPY TIME:  1 minutes and 54 seconds. PROCEDURE: The procedure, risks, benefits, and alternatives were explained to the patient's family. Questions regarding the procedure were encouraged and answered. The patient's son understands and consents to the procedure. A 5-French catheter was then advanced through the patient's mouth under fluoroscopy into the esophagus and to the level of the stomach. This catheter was used to insufflate the stomach with air under fluoroscopy. The abdominal wall was prepped with chlorhexidine in a sterile fashion, and a sterile drape was applied covering the operative field. A sterile gown and sterile gloves were used for the procedure. Local anesthesia was provided with 1% Lidocaine. A skin incision was made in the upper abdominal wall. Under fluoroscopy, an 18 gauge trocar needle was advanced  into the stomach. Contrast injection was performed to confirm intraluminal position of the needle tip. A single T tack was then deployed in the lumen of the stomach. This was brought up to tension at the skin surface. Over a guidewire, a 9-French sheath was advanced into the lumen of the stomach. The wire was left in place as a safety wire. A loop snare device from a percutaneous gastrostomy kit was then advanced into the stomach. A floppy guide wire was advanced through the orogastric catheter under fluoroscopy in the stomach. The loop snare advanced through the percutaneous gastric access was used to snare the guide wire. This allowed withdrawal of the loop snare out of the patient's mouth by retraction of the orogastric catheter and wire. A 20-French bumper retention gastrostomy tube was looped around the snare device. It was then pulled back through the patient's mouth. The retention bumper was brought up to the anterior gastric wall. The T tack suture was cut at the skin. The exiting gastrostomy tube was cut to appropriate length and a feeding adapter applied. The catheter was injected with contrast material to confirm position and a fluoroscopic spot image saved. The tube was then flushed with saline. A dressing was applied over the gastrostomy exit site. COMPLICATIONS: None. FINDINGS: The stomach distended well with air allowing safe placement of the gastrostomy tube. After placement, the tip of the gastrostomy tube lies in the body of the stomach. IMPRESSION: Percutaneous gastrostomy with placement of a 20-French bumper retention tube in the body of the stomach. This tube can be used for percutaneous feeds beginning in 24 hours after placement. Electronically Signed   By: Aletta Edouard M.D.   On: 10/15/2018 15:01    Scheduled Meds: . budesonide (PULMICORT) nebulizer solution  0.25 mg Nebulization BID  . chlorhexidine  15 mL Mouth Rinse BID  . cloNIDine  0.3 mg Transdermal Weekly  . [START ON  10/16/2018] enoxaparin (LOVENOX) injection  40 mg Subcutaneous Q24H  . free water  250 mL Per Tube Q8H  . levothyroxine  75 mcg Per NG tube Q0600  . LORazepam  2 mg Intravenous Once  . mouth rinse  15 mL Mouth Rinse q12n4p  . metoprolol tartrate  25 mg  Per Tube BID  . sodium polystyrene  15 g Oral Once    Admission status: Inpatient: Based on patients clinical presentation and evaluation of above clinical data, I have made determination that patient meets Inpatient criteria at this time.  Time spent: 20 min  [facility] Pager [phone]. If 7PM-7AM, please contact night-coverage at [email or web address], Office  [phone]  password [email or web address]  10/15/2018, 4:47 PM  LOS: 21 days

## 2018-10-15 NOTE — Consult Note (Signed)
Chief Complaint: Patient was seen in consultation today for gastrostomy tube placement.  Referring Physician(s): Dr. Sander Radon  Supervising Physician: Aletta Edouard  Patient Status: [facility] - In-pt  History of Present Illness: Katie Mosley is a 78 y.o. female with a past medical history significant for asthma, adrenal insufficiency, glaucoma, GERD, hypothyroidism, chronic hyponatremia, CKD, anemia, HTN and CHF who was recently admitted to [facility] due to acute encephalopathy and aspiration PNA. She had displayed poor PO intake while admitted and an Cortrak was placed for additional nutritional support. She was recently evaluated by speech therapy who recommended consideration of long term means of supplemental nutrition. IR has been consulted for gastrostomy tube placement. Family was initially hesitant about g-tube placement - particularly the method used to place the g-tube, the length of the g-tube protruding from her abdomen and further care of the g-tube at home. After extensive discussion between myself, the family, her primary team and GI the family has elected to proceed with pull through g-tube placement with moderate sedation today in IR.  Patient is non-verbal, is alert and tracks examiner at times. Per family she speaks no English so will not follow commands in [place], she will follow commands in her native language. Family states they feel as if she has been improving lately and are hopeful that after the g-tube is placed that she can return home. They are concerned about how long the NG tube will need to remain after the procedure as well as care for the g-tube at home. After discussion regarding these issues consent was provided by the patient's son.   Past Medical History:  Diagnosis Date  . Adrenal insufficiency ([place])   . Adrenal insufficiency ([place])    Dx'd [facility] 05/2015  . Asthma   . CHF (congestive heart failure) ([place])   . GERD (gastroesophageal reflux disease)   .  Glaucoma   . Headache   . HTN (hypertension)   . Hypertension   . Hypothyroidism   . Normocytic anemia 09/07/2018  . Shortness of breath dyspnea   . Thyroid disease    not know in detail    Past Surgical History:  Procedure Laterality Date  . APPENDECTOMY    . BACK SURGERY    . CAPSULOTOMY  08/23/2011   Procedure: MINOR CAPSULOTOMY;  Surgeon: Myrtha Mantis.;  Location: [place];  Service: Ophthalmology;  Laterality: Left;  YAG Capsulotomy Left Eye  . ESOPHAGOGASTRODUODENOSCOPY N/A 03/17/2013   Procedure: ESOPHAGOGASTRODUODENOSCOPY (EGD);  Surgeon: Irene Shipper, MD;  Location: [facility] ENDOSCOPY;  Service: Endoscopy;  Laterality: N/A;  . LEFT CATARACT EXTRACTION     2007  . RIGHT CATARACT EXTRACTION     2006    Allergies: Patient has no known allergies.  Medications: Prior to Admission medications   Medication Sig Start Date End Date Taking? Authorizing Provider  acetaminophen (TYLENOL) 325 MG tablet Take 325 mg by mouth every 6 (six) hours as needed for moderate pain.    Yes [provider]  albuterol (PROVENTIL HFA;VENTOLIN HFA) 108 (90 Base) MCG/ACT inhaler Inhale 2 puffs into the lungs. Every 4 to 6 hours as needed for shortness of breath and wheezing 09/07/18  Yes [provider]  aspirin EC 81 MG tablet Take 81 mg by mouth daily.   Yes [provider]  docusate sodium (COLACE) 100 MG capsule Take 1 capsule (100 mg total) by mouth every 12 (twelve) hours. 10/31/16  Yes Isla Pence, MD  furosemide (LASIX) 40 MG tablet Take 40  mg by mouth daily. 08/05/18  Yes [provider]  HYDROcodone-acetaminophen (NORCO/VICODIN) 5-325 MG tablet Take 1 tablet by mouth every 4 (four) hours as needed. Patient taking differently: Take 1 tablet by mouth every 4 (four) hours as needed for moderate pain.  10/31/16  Yes Isla Pence, MD  Iron-FA-B Cmp-C-Biot-Probiotic (FUSION PLUS) CAPS Take 1 tablet by mouth daily.   Yes [provider]    levothyroxine (SYNTHROID, LEVOTHROID) 50 MCG tablet Take 1 tablet (50 mcg total) by mouth daily before breakfast. 02/18/15  Yes Velvet Bathe, MD  metoprolol succinate (TOPROL-XL) 50 MG 24 hr tablet Take 50 mg by mouth daily. 09/05/14  Yes [provider]  pantoprazole (PROTONIX) 40 MG tablet Take 1 tablet (40 mg total) by mouth daily. 11/05/15  Yes Ghimire, Henreitta Leber, MD     History reviewed. No pertinent family history.  Social History   Socioeconomic History  . Marital status: Widowed    Spouse name: Not on file  . Number of children: Not on file  . Years of education: Not on file  . Highest education level: Not on file  Occupational History  . Not on file  Social Needs  . Financial resource strain: Not on file  . Food insecurity:    Worry: Not on file    Inability: Not on file  . Transportation needs:    Medical: Not on file    Non-medical: Not on file  Tobacco Use  . Smoking status: Never Smoker  . Smokeless tobacco: Never Used  Substance and Sexual Activity  . Alcohol use: No  . Drug use: No  . Sexual activity: Never  Lifestyle  . Physical activity:    Days per week: Not on file    Minutes per session: Not on file  . Stress: Not on file  Relationships  . Social connections:    Talks on phone: Not on file    Gets together: Not on file    Attends religious service: Not on file    Active member of club or organization: Not on file    Attends meetings of clubs or organizations: Not on file    Relationship status: Not on file  Other Topics Concern  . Not on file  Social History Narrative   ** Merged History Encounter **         Review of Systems: A 12 point ROS discussed and pertinent positives are indicated in the HPI above.  All other systems are negative.  Review of Systems  Unable to perform ROS: Patient nonverbal    Vital Signs: BP (!) 121/54   Pulse 66   Temp 98 F (36.7 C) (Oral)   Resp 20   Ht 4\' 11"  (1.499 m)   Wt 210 lb (95.3 kg)    SpO2 94%   BMI 42.41 kg/m   Physical Exam Vitals signs and nursing note reviewed.  Constitutional:      General: She is not in acute distress.    Comments: Frail, elderly female. Poor eye contact, does track examiner a little. Follows commands from family at bedside. Smiles occasionally.   HENT:     Head: Normocephalic.  Cardiovascular:     Rate and Rhythm: Normal rate and regular rhythm.  Pulmonary:     Effort: Pulmonary effort is normal.     Breath sounds: Normal breath sounds.  Abdominal:     General: There is no distension.     Palpations: Abdomen is soft.     Tenderness:  There is no abdominal tenderness.     Comments: (+) NG in place  Skin:    General: Skin is warm and dry.  Neurological:     Mental Status: She is alert. Mental status is at baseline.      MD Evaluation Airway: WNL(small white area on back portion of tongue) Heart: WNL Abdomen: WNL Chest/ Lungs: WNL ASA  Classification: 3 Mallampati/Airway Score: Two   Imaging: Dg Chest 2 View  Result Date: 09/24/2018 CLINICAL DATA:  Shortness of breath. EXAM: CHEST - 2 VIEW COMPARISON:  06/22/2018 and 10/16/2017 FINDINGS: There is new pulmonary vascular congestion. There is new peripheral haziness in the right lung cyst pulmonary edema. No discrete effusions. Heart is within normal limits of size considering the AP portable technique. Aortic atherosclerosis.  No acute bone abnormalities. IMPRESSION: New pulmonary vascular congestion with slight peripheral pulmonary edema on the right. Electronically Signed   By: Lorriane Shire M.D.   On: 09/24/2018 04:12   Dg Abd 1 View  Result Date: 09/28/2018 CLINICAL DATA:  Ileus EXAM: ABDOMEN - 1 VIEW COMPARISON:  09/27/2018 FINDINGS: Nonobstructive bowel gas pattern. Mild residual contrast within ascending and transverse colon. No colonic dilatation. Degenerative changes of the lumbar spine. IMPRESSION: Unremarkable abdominal radiograph. Electronically Signed   By: Julian Hy M.D.   On: 09/28/2018 05:08   Dg Abd 1 View  Result Date: 09/27/2018 CLINICAL DATA:  Encounter for feeding tube placement. EXAM: ABDOMEN - 1 VIEW COMPARISON:  Radiograph of same day.  CT scan of June 22, 2018. FINDINGS: No small bowel dilatation is noted. Residual contrast is seen in the colon. Colonic dilatation is noted suggesting possible ileus. No abnormal calcifications are noted. IMPRESSION: Colonic dilatation is noted in left side of abdomen concerning for possible ileus. Electronically Signed   By: Marijo Conception, M.D.   On: 09/27/2018 12:21   Ct Head Wo Contrast  Result Date: 09/27/2018 CLINICAL DATA:  Neurological deficits with involuntary movements. EXAM: CT HEAD WITHOUT CONTRAST TECHNIQUE: Contiguous axial images were obtained from the base of the skull through the vertex without intravenous contrast. COMPARISON:  09/24/2018 FINDINGS: Brain: Diffuse cerebral atrophy. Ventricular dilatation consistent with central atrophy. Low-attenuation changes in the deep white matter consistent with small vessel ischemia. Old parenchymal calcifications in the basal ganglia and left cerebellum without change. No mass effect or midline shift. No abnormal extra-axial fluid collections. Gray-white matter junctions are distinct. Basal cisterns are not effaced. No acute intracranial hemorrhage. Vascular: Prominent intracranial arterial vascular calcifications are present. Skull: Calvarium appears intact. No acute displaced fractures identified. Sinuses/Orbits: Paranasal sinuses and mastoid air cells are clear. Other: No significant change since prior study. IMPRESSION: No acute intracranial abnormalities. Chronic atrophy and small vessel ischemic changes. Electronically Signed   By: Lucienne Capers M.D.   On: 09/27/2018 03:56   Ct Head Wo Contrast  Result Date: 09/24/2018 CLINICAL DATA:  78 y/o  F; 3 days of weakness. EXAM: CT HEAD WITHOUT CONTRAST TECHNIQUE: Contiguous axial images were obtained  from the base of the skull through the vertex without intravenous contrast. COMPARISON:  10/16/2017 CT head. FINDINGS: Brain: No evidence of acute infarction, hemorrhage, hydrocephalus, extra-axial collection or mass lesion/mass effect. Stable dystrophic calcifications in the basal ganglia and calcifications in the left cerebellar hemisphere. 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 Stable chronic microvascular ischemic changes and volume loss of the brain. Vascular: Calcific atherosclerosis of carotid siphons. No hyperdense vessel identified. Skull: Normal. Negative for fracture or focal lesion.  Sinuses/Orbits: No acute finding. Other: Bilateral intra-ocular lens replacement. IMPRESSION: 1. No acute intracranial abnormality identified. 2. Stable chronic microvascular ischemic changes and volume loss of the brain. Stable small chronic lacunar infarct in the right caudate nucleus. Electronically Signed   By: Kristine Garbe M.D.   On: 09/24/2018 05:15   Mr Brain Wo Contrast  Result Date: 09/29/2018 CLINICAL DATA:  Acute encephalopathy. Jerking movements. Progressive weakness. EXAM: MRI HEAD WITHOUT CONTRAST TECHNIQUE: Multiplanar, multiecho pulse sequences of the brain and surrounding structures were obtained without intravenous contrast. COMPARISON:  Head CT 09/27/2018 and MRI 08/08/2015 FINDINGS: Brain: There is no evidence of acute infarct, intracranial hemorrhage, mass, midline shift, or extra-axial fluid collection. Patchy to confluent T2 hyperintensities in the cerebral white matter are slightly progressed from the prior MRI and are nonspecific but compatible with moderate chronic small vessel ischemic disease. Mild T2 hyperintensity in the basal ganglia and thalami is similar to the prior MRI. 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 There is a small remote insult in the left cerebellum with associated calcification on CT.  Vascular: Major intracranial vascular flow voids are preserved. Skull and upper cervical spine: Unremarkable bone marrow signal. Sinuses/Orbits: Bilateral cataract extraction. Clear paranasal sinuses. Trace mastoid effusions. Other: None. IMPRESSION: 1. No acute intracranial abnormality. 2. Moderate chronic small vessel ischemic disease and cerebral atrophy. Electronically Signed   By: Logan Bores M.D.   On: 09/29/2018 13:22   Dg Chest Port 1 View  Result Date: 10/06/2018 CLINICAL DATA:  CHF EXAM: PORTABLE CHEST 1 VIEW COMPARISON:  Chest x-rays dated 10/04/2018, 09/30/2018 and 09/24/2018 FINDINGS: Stable cardiomegaly. Persistent central pulmonary vascular congestion but improved aeration of both lungs compared to the most recent study of 10/04/2018. Persistent small bilateral pleural effusions and probable associated atelectasis. Enteric tube passes below the diaphragm. IMPRESSION: 1. Improved aeration compared to the most recent chest x-ray of 10/04/2018, presumably resolving pulmonary edema related to improved fluid status. 2. Stable bibasilar opacities, likely a combination of small pleural effusions and atelectasis. Electronically Signed   By: Franki Cabot M.D.   On: 10/06/2018 20:11   Dg Chest Port 1 View  Result Date: 10/04/2018 CLINICAL DATA:  Shortness of Breath EXAM: PORTABLE CHEST 1 VIEW COMPARISON:  September 30, 2018 FINDINGS: There are pleural effusions bilaterally. There appears to be loculated pleural effusion on the left, a change from previous study. There is cardiomegaly with pulmonary venous hypertension. There is a degree of underlying interstitial edema. There is aortic atherosclerosis. No bone lesions evident. IMPRESSION: Pulmonary vascular congestion with interstitial edema and pleural effusions. There is apparent loculated effusion on the left. A degree of superimposed airspace consolidation in this area can not be excluded. The overall appearance is felt to be most consistent with  congestive heart failure. There is aortic atherosclerosis. Electronically Signed   By: Lowella Grip III M.D.   On: 10/04/2018 10:29   Dg Chest Port 1 View  Result Date: 09/30/2018 CLINICAL DATA:  Fever. EXAM: PORTABLE CHEST 1 VIEW COMPARISON:  Chest x-ray 09/27/2018. FINDINGS: Cardiomegaly with bilateral interstitial prominence. Improved aeration from prior exam. Persistent right lower lobe alveolar infiltrate. This could represent asymmetric pulmonary edema and/or pneumonia. Small bilateral pleural effusions. IMPRESSION: 1. Cardiomegaly with bilateral interstitial prominence, improved aeration from prior exam. Findings suggesting improving CHF. Small bilateral pleural effusions. 2. Persistent right base infiltrate. This could represent asymmetric pulmonary edema and/or pneumonia. Similar findings on prior exam. Electronically Signed   By: Marcello Moores  Register   On: 09/30/2018 13:34   Dg Chest Port 1 View  Result Date: 09/27/2018 CLINICAL DATA:  Acute respiratory failure. EXAM: PORTABLE CHEST 1 VIEW COMPARISON:  Chest x-ray dated September 25, 2018. FINDINGS: The patient is rotated to the left, limiting evaluation. Stable cardiomegaly. Unchanged mild interstitial edema, small bilateral pleural effusions, and bibasilar atelectasis. Apparent increased density at the right lung base is likely due to rotation. No pneumothorax. No acute osseous abnormality. IMPRESSION: 1. Unchanged mild interstitial edema, small bilateral pleural effusions, and bibasilar atelectasis. Electronically Signed   By: Titus Dubin M.D.   On: 09/27/2018 10:33   Dg Chest Port 1 View  Result Date: 09/25/2018 CLINICAL DATA:  Follow-up swallow study. EXAM: PORTABLE CHEST 1 VIEW COMPARISON:  Radiographs of September 24, 2018. FINDINGS: Stable cardiomegaly is noted. Atherosclerosis of thoracic aorta is noted. No pneumothorax is noted. Minimal bibasilar subsegmental atelectasis is noted with minimal pleural effusions. Bony thorax is  unremarkable. Triangular area of increased density is seen in the region of the upper thoracic spine; is uncertain if this represents retained contrast from prior swallowing study. Rounded barium tablet is not clearly identified. IMPRESSION: Minimal bibasilar subsegmental atelectasis is noted with minimal pleural effusions. Triangular area of increased density is seen projected over upper thoracic spine which potentially may represent residual contrast from prior swallowing study. Aortic Atherosclerosis (ICD10-I70.0). Electronically Signed   By: Marijo Conception, M.D.   On: 09/25/2018 13:18   Dg Abd Portable 1v  Result Date: 10/06/2018 CLINICAL DATA:  Evaluate feeding tube position. EXAM: PORTABLE ABDOMEN - 1 VIEW COMPARISON:  09/28/2018 FINDINGS: Feeding tube has been placed in the interval. This terminates at the antral/pyloric region. Contrast remains in normal caliber colon. Bibasilar airspace disease. No gross free intraperitoneal air IMPRESSION: Feeding tube terminating at the antral/pyloric region. Electronically Signed   By: Abigail Miyamoto M.D.   On: 10/06/2018 18:14   Dg Swallowing Func-speech Pathology  Result Date: 10/10/2018 Objective Swallowing Evaluation: Type of Study: MBS-Modified Barium Swallow Study  Patient Details Name: Katie Mosley MRN: [id number] Date of Birth: 1941/08/08 Today's Date: 10/10/2018 Time: SLP Start Time (ACUTE ONLY): 0935 -SLP Stop Time (ACUTE ONLY): 1000 SLP Time Calculation (min) (ACUTE ONLY): 25 min Past Medical History: Past Medical History: Diagnosis Date . Adrenal insufficiency ([place])  . Adrenal insufficiency ([place])   Dx'd [facility] 05/2015 . Asthma  . CHF (congestive heart failure) ([place])  . GERD (gastroesophageal reflux disease)  . Glaucoma  . Headache  . HTN (hypertension)  . Hypertension  . Hypothyroidism  . Normocytic anemia 09/07/2018 . Shortness of breath dyspnea  . Thyroid disease   not know in detail Past Surgical History: Past Surgical History: Procedure Laterality  Date . APPENDECTOMY   . BACK SURGERY   . CAPSULOTOMY  08/23/2011  Procedure: MINOR CAPSULOTOMY;  Surgeon: Myrtha Mantis.;  Location: [place];  Service: Ophthalmology;  Laterality: Left;  YAG Capsulotomy Left Eye . ESOPHAGOGASTRODUODENOSCOPY N/A 03/17/2013  Procedure: ESOPHAGOGASTRODUODENOSCOPY (EGD);  Surgeon: Irene Shipper, MD;  Location: [facility] ENDOSCOPY;  Service: Endoscopy;  Laterality: N/A; . LEFT CATARACT EXTRACTION    2007 . RIGHT CATARACT EXTRACTION    2006 HPI: Pt is a 78 yo female adm to [facility] with AMS x3 days, CT head + basal ganglia and left cerebellar calcification, 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  Pt with h/o CHF, renal dysfunction. 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  MBS  was conducted on 09/25/18 which revealed the following: Oral deficits significant mostly with liquids resulting in liquids spilling into pharynx not controlled and resulting in aspiration before the swallow with nectar.  Aspiration after the swallow from pyriform sinus spillage did elicit a delayed cough but not adequately strong to clear pharynx.  Chin tuck posture improved airway protection significantly and prevented aspiration as well as decreased pharyngeal residuals.  Cued dry swallows helpful to clear oral residuals. A full liquid diet *nectar* diet was recommended at that time with allowance of thin water between meals. On 09/27/18 patient developed worsening encephalopathy and was transferred to the intensive care unit.  She was nonverbal, very confused and unable to answer questions. MRI of 09/29/18 was negative for acute changes and cortrak was placed on 09/30/18 since lethargy prohibity the pt from safely tolerating a p.o. diet.  Subjective: Pt awake, but significanly weak. Granddaughter present Assessment / Plan / Recommendation CHL IP CLINICAL IMPRESSIONS 10/10/2018 Clinical Impression Pt presents with moderate-severe oropharyngeal dysphagia  characterized by oral holding, a severely increased oral transit time, a pharyngeal delay, reduced lingual retraction which resulted in moderate vallecular residue, and pyriform sinus residue secondary to reduced laryngeal excursion. No instances of penetrations/aspiration were noted during deglution but thin liquids barium was noted on the laryngeal surface of the epiglottis after the pt was layed flat for repositioning. A liquid wash was effective in reducing pharyngeal residue to a more functional level and amount of residue  Increased with bolus size. Pt is at high risk for aspiration after deglutition due to pharyngeal residue if swallowing precautions are not consistently observed. It is recommended that a puree diet with nectar thick liquids be initiated at this time with strict observance of swallowing precautions. However, with consideration of the level of support the pt needed to consistently trigger a volitional swallow, adequacy of oral intake is questioned and supplimental non-oral alimentation (e.g., G-tube) may be necessary. Pt's son, RN Kasandra Knudsen), and Dr. Cathlean Sauer were all educated regarding the results and recommendations.  SLP Visit Diagnosis Dysphagia, oropharyngeal phase (R13.12) Attention and concentration deficit following -- Frontal lobe and executive function deficit following -- Impact on safety and function Moderate aspiration risk;Severe aspiration risk;Risk for inadequate nutrition/hydration   CHL IP TREATMENT RECOMMENDATION 10/10/2018 Treatment Recommendations Therapy as outlined in treatment plan below   Prognosis 10/10/2018 Prognosis for Safe Diet Advancement Fair Barriers to Reach Goals Severity of deficits;Cognitive deficits;Behavior Barriers/Prognosis Comment -- CHL IP DIET RECOMMENDATION 10/10/2018 SLP Diet Recommendations Alternative means - long-term;Dysphagia 1 (Puree) solids;Nectar thick liquid Liquid Administration via Cup;Straw Medication Administration Crushed with puree  Compensations Minimize environmental distractions;Slow rate;Small sips/bites;Multiple dry swallows after each bite/sip;Follow solids with liquid Postural Changes Seated upright at 90 degrees   CHL IP OTHER RECOMMENDATIONS 10/10/2018 Recommended Consults -- Oral Care Recommendations Oral care BID;Oral care before and after PO;Staff/trained caregiver to provide oral care Other Recommendations Order thickener from pharmacy   CHL IP FOLLOW UP RECOMMENDATIONS 10/10/2018 Follow up Recommendations Skilled Nursing facility   [facility] IP FREQUENCY AND DURATION 10/10/2018 Speech Therapy Frequency (ACUTE ONLY) min 2x/week Treatment Duration 2 weeks      CHL IP ORAL PHASE 10/10/2018 Oral Phase Impaired Oral - Pudding Teaspoon -- Oral - Pudding Cup -- Oral - Honey Teaspoon -- Oral - Honey Cup -- Oral - Nectar Teaspoon Delayed oral transit;Holding of bolus;Lingual pumping;Lingual/palatal residue Oral - Nectar Cup -- Oral - Nectar Straw Delayed oral transit;Holding of bolus;Lingual pumping;Lingual/palatal residue Oral - Thin Teaspoon Delayed oral transit;Holding of bolus;Lingual  pumping;Lingual/palatal residue Oral - Thin Cup -- Oral - Thin Straw Delayed oral transit;Holding of bolus;Lingual pumping;Lingual/palatal residue Oral - Puree Delayed oral transit;Holding of bolus;Lingual pumping;Lingual/palatal residue Oral - Mech Soft Impaired mastication;Lingual pumping;Holding of bolus Oral - Regular -- Oral - Multi-Consistency -- Oral - Pill NT Oral Phase - Comment --  CHL IP PHARYNGEAL PHASE 10/10/2018 Pharyngeal Phase Impaired Pharyngeal- Pudding Teaspoon -- Pharyngeal -- Pharyngeal- Pudding Cup -- Pharyngeal -- Pharyngeal- Honey Teaspoon -- Pharyngeal -- Pharyngeal- Honey Cup -- Pharyngeal -- Pharyngeal- Nectar Teaspoon Delayed swallow initiation-vallecula;Delayed swallow initiation-pyriform sinuses;Reduced laryngeal elevation;Reduced airway/laryngeal closure;Reduced tongue base retraction;Pharyngeal residue - valleculae;Pharyngeal residue  - pyriform;Reduced epiglottic inversion Pharyngeal -- Pharyngeal- Nectar Cup -- Pharyngeal -- Pharyngeal- Nectar Straw Delayed swallow initiation-vallecula;Delayed swallow initiation-pyriform sinuses;Reduced laryngeal elevation;Reduced airway/laryngeal closure;Reduced tongue base retraction;Pharyngeal residue - valleculae;Pharyngeal residue - pyriform Pharyngeal -- Pharyngeal- Thin Teaspoon Delayed swallow initiation-vallecula;Delayed swallow initiation-pyriform sinuses;Reduced laryngeal elevation;Reduced airway/laryngeal closure;Reduced tongue base retraction;Pharyngeal residue - valleculae;Pharyngeal residue - pyriform;Penetration/Apiration after swallow Pharyngeal -- Pharyngeal- Thin Cup -- Pharyngeal -- Pharyngeal- Thin Straw Delayed swallow initiation-vallecula;Delayed swallow initiation-pyriform sinuses;Reduced laryngeal elevation;Reduced airway/laryngeal closure;Reduced tongue base retraction;Pharyngeal residue - valleculae;Pharyngeal residue - pyriform Pharyngeal Material enters airway, remains ABOVE vocal cords and not ejected out Pharyngeal- Puree Delayed swallow initiation-vallecula;Reduced laryngeal elevation;Reduced airway/laryngeal closure;Reduced tongue base retraction;Pharyngeal residue - valleculae;Pharyngeal residue - pyriform Pharyngeal -- Pharyngeal- Mechanical Soft NT Pharyngeal -- Pharyngeal- Regular -- Pharyngeal -- Pharyngeal- Multi-consistency -- Pharyngeal -- Pharyngeal- Pill NT Pharyngeal -- Pharyngeal Comment --  CHL IP CERVICAL ESOPHAGEAL PHASE 10/10/2018 Cervical Esophageal Phase WFL Pudding Teaspoon -- Pudding Cup -- Honey Teaspoon -- Honey Cup -- Nectar Teaspoon -- Nectar Cup -- Nectar Straw -- Thin Teaspoon -- Thin Cup -- Thin Straw -- Puree -- Mechanical Soft -- Regular -- Multi-consistency -- Pill -- Cervical Esophageal Comment -- Shanika I. Hardin Negus, [place], [place] Office number [phone] Pager [place] 10/10/2018, 11:56 AM               Dg Swallowing Func-speech Pathology  Result Date: 09/25/2018 Objective Swallowing Evaluation: Type of Study: MBS-Modified Barium Swallow Study  Patient Details Name: Katie Mosley MRN: [id number] Date of Birth: [birth year] Today's Date: 09/25/2018 Time: SLP Start Time (ACUTE ONLY): 0750 -SLP Stop Time (ACUTE ONLY): 0804 SLP Time Calculation (min) (ACUTE ONLY): 14 min Past Medical History: Past Medical History: Diagnosis Date . Adrenal insufficiency ([place])  . Adrenal insufficiency ([place])   Dx'd [facility] 05/2015 . Asthma  . CHF (congestive heart failure) ([place])  . GERD (gastroesophageal reflux disease)  . Glaucoma  . Headache  . HTN (hypertension)  . Hypertension  . Hypothyroidism  . Normocytic anemia 09/07/2018 . Shortness of breath dyspnea  . Thyroid disease   not know in detail Past Surgical History: Past Surgical History: Procedure Laterality Date . APPENDECTOMY   . BACK SURGERY   . CAPSULOTOMY  08/23/2011  Procedure: MINOR CAPSULOTOMY;  Surgeon: Myrtha Mantis.;  Location: [place];  Service: Ophthalmology;  Laterality: Left;  YAG Capsulotomy Left Eye . ESOPHAGOGASTRODUODENOSCOPY N/A 03/17/2013  Procedure: ESOPHAGOGASTRODUODENOSCOPY (EGD);  Surgeon: Irene Shipper, MD;  Location: [facility] ENDOSCOPY;  Service: Endoscopy;  Laterality: N/A; . LEFT CATARACT EXTRACTION    2007 . 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  Pt with h/o CHF, renal dysfunction. Swallow evauluation ordered as pt had  difficulty swallowing a pill this am.  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   Subjective: pt awake in chair Assessment / Plan / Recommendation CHL IP CLINICAL IMPRESSIONS 09/25/2018 Clinical Impression Pt's swallow function was inconsistent throughout MBS - neck extension posture significantly worsened pharyngeal contraction and  thus resulted in gross residuals of puree/thin. Oral deficits significant mostly with liquids resulting in liquids spilling into pharynx not controlled and resulting in aspiration before the swallow with nectar.  Aspiration after the swallow from pyriform sinus spillage did elicit a delayed cough but not adequately strong to clear pharynx.  Chin tuck posture improved airway protection significantly and prevented aspiration as well as decreased pharyngeal residuals.  Cued dry swallows helpful to clear oral residuals.  Of note, pt appeared with significant stasis in proximal esophagus without sensation *just below UES without awareness.  Pudding consistency with tablet did not clear this area despite follow up liquid swallows.  Dependent on family's goal for pt, consideration for GI referral recommended given son's report of chronicity of dysphagia for "a long time".  Recommend to start full liquid diet *nectar* and allow thin water between meals. Use straw to faciliate chin down posture for pt.  Son present and wishes to interpret *not ipad interpreter* therefore allowed in xray.  SLP will follow up for pt tolerance, family education.   SLP Visit Diagnosis Dysphagia, oropharyngeal phase (R13.12);Dysphagia, pharyngoesophageal phase (R13.14) Attention and concentration deficit following -- Frontal lobe and executive function deficit following -- Impact on safety and function Moderate aspiration risk;Risk for inadequate nutrition/hydration   CHL IP TREATMENT RECOMMENDATION 09/25/2018 Treatment Recommendations Therapy as outlined in treatment plan below   Prognosis 09/25/2018 Prognosis for Safe Diet Advancement Fair Barriers to Reach Goals Time post onset Barriers/Prognosis Comment -- CHL IP DIET RECOMMENDATION 09/25/2018 SLP Diet Recommendations Nectar thick liquid Liquid Administration via -- Medication Administration Crushed with puree Compensations Slow rate;Small sips/bites Postural Changes --   CHL IP OTHER  RECOMMENDATIONS 09/25/2018 Recommended Consults Consider GI evaluation Oral Care Recommendations Oral care before and after PO Other Recommendations Order thickener from pharmacy;Have oral suction available   CHL IP FOLLOW UP RECOMMENDATIONS 09/25/2018 Follow up Recommendations (No Data)   CHL IP FREQUENCY AND DURATION 09/25/2018 Speech Therapy Frequency (ACUTE ONLY) min 2x/week Treatment Duration 1 week      CHL IP ORAL PHASE 09/25/2018 Oral Phase Impaired Oral - Pudding Teaspoon -- Oral - Pudding Cup -- Oral - Honey Teaspoon -- Oral - Honey Cup -- Oral - Nectar Teaspoon Weak lingual manipulation;Decreased bolus cohesion;Reduced posterior propulsion;Premature spillage Oral - Nectar Cup -- Oral - Nectar Straw Weak lingual manipulation;Decreased bolus cohesion;Reduced posterior propulsion;Premature spillage;Delayed oral transit Oral - Thin Teaspoon Decreased bolus cohesion;Weak lingual manipulation;Reduced posterior propulsion;Premature spillage;Lingual/palatal residue;Delayed oral transit Oral - Thin Cup -- Oral - Thin Straw Reduced posterior propulsion;Weak lingual manipulation;Decreased bolus cohesion;Premature spillage;Lingual/palatal residue;Delayed oral transit Oral - Puree Decreased bolus cohesion;Weak lingual manipulation;Lingual pumping;Premature spillage;Lingual/palatal residue;Delayed oral transit Oral - Mech Soft Decreased bolus cohesion;Premature spillage;Delayed oral transit Oral - Regular -- Oral - Multi-Consistency -- Oral - Pill Decreased bolus cohesion;Premature spillage;Weak lingual manipulation;Lingual pumping;Delayed oral transit Oral Phase - Comment --  CHL IP PHARYNGEAL PHASE 09/25/2018 Pharyngeal Phase Impaired Pharyngeal- Pudding Teaspoon -- Pharyngeal -- Pharyngeal- Pudding Cup -- Pharyngeal -- Pharyngeal- Honey Teaspoon -- Pharyngeal -- Pharyngeal- Honey Cup -- Pharyngeal -- Pharyngeal- Nectar Teaspoon -- Pharyngeal -- Pharyngeal- Nectar Cup -- Pharyngeal -- Pharyngeal- Nectar Straw [facility]  Pharyngeal -- Pharyngeal- Thin Teaspoon Penetration/Aspiration before swallow Pharyngeal Material enters airway, passes BELOW cords without attempt by patient to eject out (silent aspiration) Pharyngeal- Thin Cup -- Pharyngeal -- Pharyngeal- Thin Straw Penetration/Apiration after swallow;Pharyngeal residue - valleculae;Pharyngeal residue - pyriform Pharyngeal Material enters airway, passes BELOW cords without attempt by patient to eject out (silent aspiration);Material enters airway, passes BELOW cords and not ejected out despite cough attempt by patient Pharyngeal- Puree Reduced tongue base retraction;Reduced airway/laryngeal closure;Reduced laryngeal elevation;Pharyngeal residue - valleculae;Pharyngeal residue - pyriform;Reduced pharyngeal peristalsis Pharyngeal -- Pharyngeal- Mechanical Soft Reduced tongue base retraction;Pharyngeal residue - valleculae Pharyngeal -- Pharyngeal- Regular -- Pharyngeal -- Pharyngeal- Multi-consistency -- Pharyngeal -- Pharyngeal- Pill Pharyngeal residue - valleculae Pharyngeal -- Pharyngeal Comment pt with gross residuals in pharynx intermittently - most notably with head extended upward with swallow, dry swallow attempts are essentially not initiated as very minimal movement observed, aspiration of thin occured after the swallow as pyriform sinus residual spill into open airway - posterior trach,   CHL IP CERVICAL ESOPHAGEAL PHASE 09/25/2018 Cervical Esophageal Phase Impaired Pudding Teaspoon -- Pudding Cup -- Honey Teaspoon -- Honey Cup -- Nectar Teaspoon -- Nectar Cup -- Nectar Straw -- Thin Teaspoon -- Thin Cup -- Thin Straw -- Puree -- Mechanical Soft -- Regular -- Multi-consistency -- Pill -- Cervical Esophageal Comment pt appeared with significant residuals below UES WITHOUT awareness, much more prominent with puree than liquids Macario Golds 09/25/2018, 10:45 AM Luanna Salk, MS [facility] SLP Acute Rehab Services Pager [phone] Office [phone]              Vas [place]  Lower Extremity Venous (dvt)  Result Date: 09/24/2018  Lower Venous Study Indications: Edema.  Performing Technologist: Oliver Hum RVT  Examination Guidelines: A complete evaluation includes B-mode imaging, spectral Doppler, color Doppler, and power Doppler as needed of all accessible portions of each vessel. Bilateral testing is considered an integral part of a complete examination. Limited examinations for reoccurring indications may be performed as noted.  Right Venous Findings: +---------+---------------+---------+-----------+----------+-------+          CompressibilityPhasicitySpontaneityPropertiesSummary +---------+---------------+---------+-----------+----------+-------+ CFV      Full           Yes      Yes                          +---------+---------------+---------+-----------+----------+-------+ SFJ      Full                                                 +---------+---------------+---------+-----------+----------+-------+ FV Prox  Full                                                 +---------+---------------+---------+-----------+----------+-------+ FV Mid   Full                                                 +---------+---------------+---------+-----------+----------+-------+ FV DistalFull                                                 +---------+---------------+---------+-----------+----------+-------+  PFV      Full                                                 +---------+---------------+---------+-----------+----------+-------+ POP      Full           Yes      Yes                          +---------+---------------+---------+-----------+----------+-------+ PTV      Full                                                 +---------+---------------+---------+-----------+----------+-------+ PERO     Full                                                 +---------+---------------+---------+-----------+----------+-------+  Left  Venous Findings: +---------+---------------+---------+-----------+----------+-------+          CompressibilityPhasicitySpontaneityPropertiesSummary +---------+---------------+---------+-----------+----------+-------+ CFV      Full           Yes      Yes                          +---------+---------------+---------+-----------+----------+-------+ SFJ      Full                                                 +---------+---------------+---------+-----------+----------+-------+ FV Prox  Full                                                 +---------+---------------+---------+-----------+----------+-------+ FV Mid   Full                                                 +---------+---------------+---------+-----------+----------+-------+ FV DistalFull                                                 +---------+---------------+---------+-----------+----------+-------+ PFV      Full                                                 +---------+---------------+---------+-----------+----------+-------+ POP      Full           Yes      Yes                          +---------+---------------+---------+-----------+----------+-------+  PTV      Full                                                 +---------+---------------+---------+-----------+----------+-------+ PERO     Full                                                 +---------+---------------+---------+-----------+----------+-------+    Summary: Right: There is no evidence of deep vein thrombosis in the lower extremity. No cystic structure found in the popliteal fossa. Left: There is no evidence of deep vein thrombosis in the lower extremity. No cystic structure found in the popliteal fossa.  *See table(s) above for measurements and observations. Electronically signed by Monica Martinez MD on 09/24/2018 at 3:52:06 PM.    Final     Labs:  CBC: Recent Labs    10/04/18 1015 10/05/18 5809  10/07/18 0434 10/15/18 0712  WBC 4.3 4.4 2.0* 3.1*  HGB 8.9* 9.1* 9.6* 7.4*  HCT 28.5* 29.0* 29.1* 22.6*  PLT 184 190 159 249    COAGS: Recent Labs    10/03/18 1215 10/15/18 0712  INR 1.13 1.00    BMP: Recent Labs    10/10/18 0625 10/12/18 0812 10/14/18 0449 10/15/18 0407  NA 137 133* 128* 130*  K 5.4* 5.4* 5.4* 5.5*  CL 114* 107 101 101  CO2 20* 18* 20* 24  GLUCOSE 127* 108* 124* 84  BUN 41* 29* 28* 29*  CALCIUM 7.4* 8.3* 8.3* 8.1*  CREATININE 1.19* 1.04* 1.26* 1.23*  GFRNONAA 44* 51* 41* 42*  GFRAA 51* 60* 47* 49*    LIVER FUNCTION TESTS: Recent Labs    09/27/18 0411  09/29/18 0354 09/30/18 0258 10/01/18 0948 10/04/18 1015  BILITOT 0.6  --   --  1.4* 0.9 0.9  AST 59*  --   --  45* 43* 31  ALT 25  --   --  25 24 17   ALKPHOS 64  --   --  59 57 53  PROT 5.9*  --   --  6.1* 5.9* 5.6*  ALBUMIN 2.3*   < > 2.3* 2.4* 2.1* 1.9*   < > = values in this interval not displayed.    TUMOR MARKERS: No results for input(s): AFPTM, CEA, CA199, CHROMGRNA in the last 8760 hours.  Assessment and Plan:  78 y/o F admitted for acute encephalopathy with ongoing poor PO intake requiring supplemental nutrition via NG tube. Speech has evaluated patient and recommends pursuing more permanent means of supplemental nutrition - IR has been consulted for g-tube placement. After many discussions with family members they are agreeable to proceed with pull through gastrostomy tube placement with moderate sedation today in IR.  Patient has been NPO since 0700 today, last dose of Lovenox 1448 on 2/18. Afebrile, WBC 3.1, hgb 7.4, plt 249, INR 1.00.  Risks and benefits discussed with the patient's family including, but not limited to the need for a barium enema during the procedure, bleeding, infection, peritonitis, or damage to adjacent structures.  All of the patient's family's questions were answered, patient's family is agreeable to proceed.  Consent signed by patient's son and is in  chart.  Thank you for this interesting consult.  I greatly enjoyed  meeting Katie Mosley and look forward to participating in their care.  A copy of this report was sent to the requesting provider on this date.  Electronically Signed: Joaquim Nam, PA-C 10/15/2018, 1:47 PM   I spent a total of 40 Minutes in face to face in clinical consultation, greater than 50% of which was counseling/coordinating care for gastrostomy tube placement.

## 2018-10-15 NOTE — Sedation Documentation (Signed)
Patient mouth breathing. ETCO2 unable to properly assessed.

## 2018-10-15 NOTE — Progress Notes (Signed)
Patient off unit for procedure.

## 2018-10-15 NOTE — Procedures (Signed)
Interventional Radiology Procedure Note  Procedure: Gastrostomy tube placement  Complications: None  Estimated Blood Loss: < 10 mL  Findings: 20 Fr bumper retention gastrostomy tube placed with tip in body of stomach. OK to use in 24 hours.  Taylan Mayhan T. Annikah Lovins, M.D Pager:  [phone]   

## 2018-10-15 NOTE — Sedation Documentation (Signed)
Patient extremely agitated. Trying to climb out of bed and fighting staff members trying to complete patient care. Kathlene Cote, MD notified.

## 2018-10-15 NOTE — Progress Notes (Signed)
SLP Cancellation Note  Patient Details Name: Katie Mosley MRN: [id number] DOB: [birth year]   Cancelled treatment:       Reason Eval/Treat Not Completed: Medical issues which prohibited therapy. Pt has been NPO for PEG placement on this date.    Horton Marshall 10/15/2018, 5:44 PM

## 2018-10-15 NOTE — Progress Notes (Signed)
OT Cancellation Note  Patient Details Name: Katie Mosley MRN: [id number] DOB: February 23, 1941   Cancelled Treatment:    Reason Eval/Treat Not Completed: Patient at procedure or test/ unavailable. Pt off unit for PEG placement. Will attempt 2/20 when appropriate.  Darleen Crocker P 10/15/2018, 2:08 PM

## 2018-10-16 DIAGNOSIS — E875 Hyperkalemia: Secondary | ICD-10-CM

## 2018-10-16 LAB — GLUCOSE, CAPILLARY
Glucose-Capillary: 101 mg/dL — ABNORMAL HIGH (ref 70–99)
Glucose-Capillary: 103 mg/dL — ABNORMAL HIGH (ref 70–99)
Glucose-Capillary: 117 mg/dL — ABNORMAL HIGH (ref 70–99)
Glucose-Capillary: 118 mg/dL — ABNORMAL HIGH (ref 70–99)
Glucose-Capillary: 119 mg/dL — ABNORMAL HIGH (ref 70–99)
Glucose-Capillary: 98 mg/dL (ref 70–99)

## 2018-10-16 LAB — BASIC METABOLIC PANEL
ANION GAP: 9 (ref 5–15)
BUN: 24 mg/dL — ABNORMAL HIGH (ref 8–23)
CO2: 20 mmol/L — ABNORMAL LOW (ref 22–32)
Calcium: 8.5 mg/dL — ABNORMAL LOW (ref 8.9–10.3)
Chloride: 100 mmol/L (ref 98–111)
Creatinine, Ser: 1.2 mg/dL — ABNORMAL HIGH (ref 0.44–1.00)
GFR calc Af Amer: 50 mL/min — ABNORMAL LOW (ref >60)
GFR, EST NON AFRICAN AMERICAN: 43 mL/min — AB (ref >60)
Glucose, Bld: 115 mg/dL — ABNORMAL HIGH (ref 70–99)
Potassium: 4.8 mmol/L (ref 3.5–5.1)
Sodium: 129 mmol/L — ABNORMAL LOW (ref 135–145)

## 2018-10-16 MED ORDER — FREE WATER
250.0000 mL | Freq: Three times a day (TID) | Status: DC
  Administered 2018-10-17 (×2): 250 mL

## 2018-10-16 MED ORDER — OSMOLITE 1.5 CAL PO LIQD
265.0000 mL | Freq: Four times a day (QID) | ORAL | Status: DC
  Administered 2018-10-16: 265 mL
  Administered 2018-10-16: 50 mL
  Administered 2018-10-17 (×2): 265 mL
  Filled 2018-10-16 (×5): qty 474

## 2018-10-16 NOTE — Progress Notes (Signed)
Occupational Therapy Treatment Patient Details Name: Katie Mosley MRN: [id number] DOB: Jan 18, 1941 Today's Date: 10/16/2018    History of present illness 78 yo female admitted to ED on 1/28 for weakness and fatigue, with medical diagnosis of sepsis with possible PNA. PMH includes adrenal insufficiency, hypothyroid, CHF, HTN, hypothyroidism, dyspnea, CKDIII.    OT comments  Pt appears to be more alert this session and able to follow 1 step verbal commands more consistently with min- mod multimodal cuing for tasks. Pt able to wash face with L UE this session with min cuing but then began to perseverate on task and needing mod cuing for redirection. Pt able to apply lotion to B hands and UEs with increased time and cuing as well. OT provided demonstration to son on how to don/doff B prevalon heel protectors for home. OT continues to recommend SNF but family requesting to take pt home with [place].  Follow Up Recommendations  SNF;Supervision/Assistance - 24 hour;Home health OT    Equipment Recommendations  Hospital bed;Other (comment)(hoyer lift)    Recommendations for Other Services      Precautions / Restrictions Precautions Precaution Comments: pt is [place], family translates Restrictions Weight Bearing Restrictions: No              ADL either performed or assessed with clinical judgement   ADL       Grooming: Wash/dry face;Cueing for sequencing;Set up                    Cognition Arousal/Alertness: Awake/alert Behavior During Therapy: Restless Overall Cognitive Status: Difficult to assess Area of Impairment: Following commands;Problem solving           Orientation Level: Person;Place;Time;Situation     Following Commands: Follows one step commands with increased time Safety/Judgement: Decreased awareness of safety;Decreased awareness of deficits Awareness: Intellectual Problem Solving: Requires tactile cues;Difficulty sequencing;Decreased initiation;Slow  processing;Requires verbal cues General Comments: difficult to assess due to language barrier, but following more commands this session.                     Pertinent Vitals/ Pain       Pain Assessment: Faces Faces Pain Scale: No hurt         Frequency  Min 2X/week        Progress Toward Goals  OT Goals(current goals can now be found in the care plan section)  Progress towards OT goals: Progressing toward goals  Acute Rehab OT Goals Patient Stated Goal: none stated this session  Plan Frequency remains appropriate;Discharge plan needs to be updated       AM-PAC OT "6 Clicks" Daily Activity     Outcome Measure   Help from another person eating meals?: Total Help from another person taking care of personal grooming?: Total Help from another person toileting, which includes using toliet, bedpan, or urinal?: Total Help from another person bathing (including washing, rinsing, drying)?: Total Help from another person to put on and taking off regular upper body clothing?: Total Help from another person to put on and taking off regular lower body clothing?: Total 6 Click Score: 6    End of Session    OT Visit Diagnosis: Muscle weakness (generalized) (M62.81)   Activity Tolerance Patient tolerated treatment well   Patient Left in bed;with call bell/phone within reach;with family/visitor present           Time: 1430-1450 OT Time Calculation (min): 20 min  Charges: OT General Charges $OT Visit: 1 Visit  OT Treatments $Self Care/Home Management : 8-22 mins    BradsherLatanya Presser, MS, OTR/L 10/16/2018, 3:03 PM

## 2018-10-16 NOTE — Progress Notes (Signed)
Referring Physician(s): Dr. Cathlean Sauer  Supervising Physician: Arne Cleveland  Patient Status:  [facility] - In-pt  Chief Complaint: Dysphagia  Subjective: S/p percutaneous gastrostomy tube placement 2/19 by Dr. Kathlene Cote. Lying comfortably in bed. Family at bedside.  Allergies: Patient has no known allergies.  Medications: Prior to Admission medications   Medication Sig Start Date End Date Taking? Authorizing Provider  acetaminophen (TYLENOL) 325 MG tablet Take 325 mg by mouth every 6 (six) hours as needed for moderate pain.    Yes [provider]  albuterol (PROVENTIL HFA;VENTOLIN HFA) 108 (90 Base) MCG/ACT inhaler Inhale 2 puffs into the lungs. Every 4 to 6 hours as needed for shortness of breath and wheezing 09/07/18  Yes [provider]  aspirin EC 81 MG tablet Take 81 mg by mouth daily.   Yes [provider]  docusate sodium (COLACE) 100 MG capsule Take 1 capsule (100 mg total) by mouth every 12 (twelve) hours. 10/31/16  Yes Isla Pence, MD  furosemide (LASIX) 40 MG tablet Take 40 mg by mouth daily. 08/05/18  Yes [provider]  HYDROcodone-acetaminophen (NORCO/VICODIN) 5-325 MG tablet Take 1 tablet by mouth every 4 (four) hours as needed. Patient taking differently: Take 1 tablet by mouth every 4 (four) hours as needed for moderate pain.  10/31/16  Yes Isla Pence, MD  Iron-FA-B Cmp-C-Biot-Probiotic (FUSION PLUS) CAPS Take 1 tablet by mouth daily.   Yes [provider]  levothyroxine (SYNTHROID, LEVOTHROID) 50 MCG tablet Take 1 tablet (50 mcg total) by mouth daily before breakfast. 02/18/15  Yes Velvet Bathe, MD  metoprolol succinate (TOPROL-XL) 50 MG 24 hr tablet Take 50 mg by mouth daily. 09/05/14  Yes [provider]  pantoprazole (PROTONIX) 40 MG tablet Take 1 tablet (40 mg total) by mouth daily. 11/05/15  Yes Ghimire, Henreitta Leber, MD     Vital Signs: BP 133/89 (BP Location: Right Arm)   Pulse 68   Temp (!) 97.4 F (36.3  C) (Axillary)   Resp 20   Ht 4' 11"  (1.499 m)   Wt 214 lb (97.1 kg)   SpO2 97%   BMI 43.22 kg/m   Physical Exam  NAD, resting comfortably Abdomen: soft, NT/ND.  Gastrostomy tube in place.  Insertion site c/d/i. No bleeding. Ready for use.   Imaging: Ir Gastrostomy Tube Mod Sed  Result Date: 10/15/2018 CLINICAL DATA:  Encephalopathy, poor oral intake and need for percutaneous gastrostomy tube for nutrition. EXAM: PERCUTANEOUS GASTROSTOMY TUBE PLACEMENT ANESTHESIA/SEDATION: 1.0 mg IV Versed; 50 mcg IV Fentanyl. Total Moderate Sedation Time 30 minutes. The patient's level of consciousness and physiologic status were continuously monitored during the procedure by Radiology nursing. CONTRAST:  20 mL Isovue-300 MEDICATIONS: 2 g IV Ancef. IV antibiotic was administered in an appropriate time interval prior to needle puncture of the skin. FLUOROSCOPY TIME:  1 minutes and 54 seconds. PROCEDURE: The procedure, risks, benefits, and alternatives were explained to the patient's family. Questions regarding the procedure were encouraged and answered. The patient's son understands and consents to the procedure. A 5-French catheter was then advanced through the patient's mouth under fluoroscopy into the esophagus and to the level of the stomach. This catheter was used to insufflate the stomach with air under fluoroscopy. The abdominal wall was prepped with chlorhexidine in a sterile fashion, and a sterile drape was applied covering the operative field. A sterile gown and sterile gloves were used for the procedure. Local anesthesia was provided with 1% Lidocaine. A skin incision was made in the upper  abdominal wall. Under fluoroscopy, an 18 gauge trocar needle was advanced into the stomach. Contrast injection was performed to confirm intraluminal position of the needle tip. A single T tack was then deployed in the lumen of the stomach. This was brought up to tension at the skin surface. Over a guidewire, a 9-French  sheath was advanced into the lumen of the stomach. The wire was left in place as a safety wire. A loop snare device from a percutaneous gastrostomy kit was then advanced into the stomach. A floppy guide wire was advanced through the orogastric catheter under fluoroscopy in the stomach. The loop snare advanced through the percutaneous gastric access was used to snare the guide wire. This allowed withdrawal of the loop snare out of the patient's mouth by retraction of the orogastric catheter and wire. A 20-French bumper retention gastrostomy tube was looped around the snare device. It was then pulled back through the patient's mouth. The retention bumper was brought up to the anterior gastric wall. The T tack suture was cut at the skin. The exiting gastrostomy tube was cut to appropriate length and a feeding adapter applied. The catheter was injected with contrast material to confirm position and a fluoroscopic spot image saved. The tube was then flushed with saline. A dressing was applied over the gastrostomy exit site. COMPLICATIONS: None. FINDINGS: The stomach distended well with air allowing safe placement of the gastrostomy tube. After placement, the tip of the gastrostomy tube lies in the body of the stomach. IMPRESSION: Percutaneous gastrostomy with placement of a 20-French bumper retention tube in the body of the stomach. This tube can be used for percutaneous feeds beginning in 24 hours after placement. Electronically Signed   By: Aletta Edouard M.D.   On: 10/15/2018 15:01    Labs:  CBC: Recent Labs    10/04/18 1015 10/05/18 0627 10/07/18 0434 10/15/18 0712  WBC 4.3 4.4 2.0* 3.1*  HGB 8.9* 9.1* 9.6* 7.4*  HCT 28.5* 29.0* 29.1* 22.6*  PLT 184 190 159 249    COAGS: Recent Labs    10/03/18 1215 10/15/18 0712  INR 1.13 1.00    BMP: Recent Labs    10/12/18 0812 10/14/18 0449 10/15/18 0407 10/16/18 0614  NA 133* 128* 130* 129*  K 5.4* 5.4* 5.5* 4.8  CL 107 101 101 100  CO2 18*  20* 24 20*  GLUCOSE 108* 124* 84 115*  BUN 29* 28* 29* 24*  CALCIUM 8.3* 8.3* 8.1* 8.5*  CREATININE 1.04* 1.26* 1.23* 1.20*  GFRNONAA 51* 41* 42* 43*  GFRAA 60* 47* 49* 50*    LIVER FUNCTION TESTS: Recent Labs    09/27/18 0411  09/29/18 0354 09/30/18 0258 10/01/18 0948 10/04/18 1015  BILITOT 0.6  --   --  1.4* 0.9 0.9  AST 59*  --   --  45* 43* 31  ALT 25  --   --  25 24 17   ALKPHOS 64  --   --  59 57 53  PROT 5.9*  --   --  6.1* 5.9* 5.6*  ALBUMIN 2.3*   < > 2.3* 2.4* 2.1* 1.9*   < > = values in this interval not displayed.    Assessment and Plan: Dysphagia s/p percutaneous gastrostomy tube placement 10/15/18 by Dr. Jamie Kato intact today.  Insertion site c/d/i without bleeding or irritation.  Abdomen is soft and non-tender.  Tube ready for use.  RN made aware. IR available if needed.  Electronically Signed: Docia Barrier, PA 10/16/2018, 12:05  PM   I spent a total of 15 Minutes at the the patient's bedside AND on the patient's [facility] floor or unit, greater than 50% of which was counseling/coordinating care for dysphagia.

## 2018-10-16 NOTE — Progress Notes (Signed)
[facility]  PROGRESS NOTE  Katie [facility] XLK:[id number] DOB: October 01, 1940 DOA: 09/23/2018 PCP: Nolene Ebbs, MD   Brief HPI:   78 year old female with a history diastolic heart failure, chronic hyponatremia, CKD stage III, chronic anemia, hypothyroidism, hypertension presents with dyspnea and weakness.Patient developed worsening encephalopathy, and was transferred to the ICU.  She was nonverbal, confused and unable to answer questions. 10/01/2018-patient was transferred to [facility] stepdown unit for further neurological work-up.  Encephalopathy was likely multifactorial, clinically she is improved, continue to have dysphagia initially family declined PEG tube but now they are okay to proceed with PEG tube.  Plan for PEG tube placement today per IR.    Subjective   Patient seen and examined, status post PEG tube placement yesterday.   Assessment/Plan:     1. Metabolic encephalopathy-slowly improving, unclear etiology.  Likely from right-sided pneumonia.Neurology was consulted, EEG and LP were negative.  Continue supportive care.  2. Dysphagia- NG tube feeding was started, family has agreed for PEG tube placement.  PEG tube placed per IR.  Will start PEG tube nutrition today as per nutrition recommendations.  3. Acute kidney injury on CKD stage III-  Creatinine stable at 1.23.  4. Hypertension-continue metoprolol, clonidine patch.  5. Acute on chronic diastolic CHF-no signs of acute exacerbation.  6. Hypothyroidism-continue Synthroid  7. Hyperkalemia-resolved, today potassium is 4.8.     CBG: Recent Labs  Lab 10/15/18 2103 10/15/18 2348 10/16/18 0411 10/16/18 0744 10/16/18 1143  GLUCAP 99 103* 98 101* 117*    CBC: Recent Labs  Lab 10/15/18 0712  WBC 3.1*  HGB 7.4*  HCT 22.6*  MCV 93.0  PLT 366    Basic Metabolic Panel: Recent Labs  Lab 10/10/18 0625 10/12/18 0812 10/14/18 0449 10/15/18 0407 10/16/18 0614  NA 137 133* 128* 130* 129*  K 5.4*  5.4* 5.4* 5.5* 4.8  CL 114* 107 101 101 100  CO2 20* 18* 20* 24 20*  GLUCOSE 127* 108* 124* 84 115*  BUN 41* 29* 28* 29* 24*  CREATININE 1.19* 1.04* 1.26* 1.23* 1.20*  CALCIUM 7.4* 8.3* 8.3* 8.1* 8.5*     DVT prophylaxis: Lovenox  Code Status: Full code  Family Communication: No family at bedside  Disposition Plan: likely home when medically ready for discharge     Consultants:  IR  Procedures:     Antibiotics:   Anti-infectives (From admission, onward)   Start     Dose/Rate Route Frequency Ordered Stop   10/15/18 1400  ceFAZolin (ANCEF) IVPB 1 g/50 mL premix     over 30 Minutes  Continuous PRN 10/15/18 1405 10/15/18 1400   10/15/18 1358  ceFAZolin (ANCEF) 2-4 GM/100ML-% IVPB    Note to Pharmacy:  Manuela Neptune   : cabinet override      10/15/18 1358 10/15/18 1510   10/01/18 2100  vancomycin (VANCOCIN) IVPB 750 mg/150 ml premix  Status:  Discontinued     750 mg 150 mL/hr over 60 Minutes Intravenous Every 48 hours 10/01/18 1959 10/02/18 1224   09/30/18 1431  vancomycin variable dose per unstable renal function (pharmacist dosing)  Status:  Discontinued      Does not apply See admin instructions 09/30/18 1431 10/01/18 1959   09/30/18 1430  acyclovir (ZOVIRAX) 500 mg in dextrose 5 % 100 mL IVPB  Status:  Discontinued     500 mg 110 mL/hr over 60 Minutes Intravenous Every 24 hours 09/30/18 1413 10/02/18 1224   09/30/18 1415  vancomycin (VANCOCIN) IVPB 1000 mg/200 mL premix  1,000 mg 200 mL/hr over 60 Minutes Intravenous  Once 09/30/18 1413 09/30/18 1834   09/30/18 1400  cefTRIAXone (ROCEPHIN) 2 g in sodium chloride 0.9 % 100 mL IVPB  Status:  Discontinued     2 g 200 mL/hr over 30 Minutes Intravenous Every 12 hours 09/30/18 1325 10/02/18 1224   09/30/18 1400  ampicillin (OMNIPEN) 2 g in sodium chloride 0.9 % 100 mL IVPB  Status:  Discontinued     2 g 300 mL/hr over 20 Minutes Intravenous Every 6 hours 09/30/18 1325 10/02/18 1224   09/25/18 1800  ceFEPIme  (MAXIPIME) 2 g in sodium chloride 0.9 % 100 mL IVPB  Status:  Discontinued     2 g 200 mL/hr over 30 Minutes Intravenous Every 24 hours 09/25/18 1239 09/29/18 0801   09/25/18 1230  ceFEPIme (MAXIPIME) 2 g in sodium chloride 0.9 % 100 mL IVPB  Status:  Discontinued     2 g 200 mL/hr over 30 Minutes Intravenous Every 24 hours 09/25/18 1219 09/25/18 1238   09/25/18 0600  ceFEPIme (MAXIPIME) 2 g in sodium chloride 0.9 % 100 mL IVPB  Status:  Discontinued     2 g 200 mL/hr over 30 Minutes Intravenous Every 12 hours 09/24/18 1634 09/25/18 1219   09/24/18 1645  ceFEPIme (MAXIPIME) 2 g in sodium chloride 0.9 % 100 mL IVPB     2 g 200 mL/hr over 30 Minutes Intravenous STAT 09/24/18 1634 09/24/18 2000   09/24/18 1630  meropenem (MERREM) 1 g in sodium chloride 0.9 % 100 mL IVPB  Status:  Discontinued     1 g 200 mL/hr over 30 Minutes Intravenous Every 12 hours 09/24/18 1523 09/24/18 1630       Objective   Vitals:   10/16/18 0550 10/16/18 0747 10/16/18 0825 10/16/18 1146  BP: 133/63 (!) 141/77  133/89  Pulse: 76 77  68  Resp:  15  20  Temp:  97.8 F (36.6 C)  (!) 97.4 F (36.3 C)  TempSrc:  Axillary  Axillary  SpO2:  98% 96% 97%  Weight:      Height:        Intake/Output Summary (Last 24 hours) at 10/16/2018 1411 Last data filed at 10/16/2018 0600 Gross per 24 hour  Intake 110 ml  Output -  Net 110 ml   Filed Weights   10/11/18 0500 10/12/18 0500 10/16/18 0500  Weight: 95.7 kg 95.3 kg 97.1 kg     Physical Examination:   General: Appears in no acute distress  Cardiovascular: S1-S2, regular, no murmur auscultated  Respiratory: Clear to auscultation bilaterally  Abdomen: Abdomen is soft, nontender, no organomegaly  Extremities: No edema of the lower extremities  Neurologic: Alert, answering questions      Data Reviewed: I have personally reviewed following labs and imaging studies   No results found for this or any previous visit (from the past 240 hour(s)).    Liver Function Tests: No results for input(s): AST, ALT, ALKPHOS, BILITOT, PROT, ALBUMIN in the last 168 hours. No results for input(s): LIPASE, AMYLASE in the last 168 hours. No results for input(s): AMMONIA in the last 168 hours.  Cardiac Enzymes: No results for input(s): CKTOTAL, CKMB, CKMBINDEX, TROPONINI in the last 168 hours. BNP (last 3 results) Recent Labs    09/24/18 1513 09/27/18 1017  BNP 1,030.5* 2,549.5*    ProBNP (last 3 results) No results for input(s): PROBNP in the last 8760 hours.    Studies: Ir Gastrostomy Tube Mod Sed  Result  Date: 10/15/2018 CLINICAL DATA:  Encephalopathy, poor oral intake and need for percutaneous gastrostomy tube for nutrition. EXAM: PERCUTANEOUS GASTROSTOMY TUBE PLACEMENT ANESTHESIA/SEDATION: 1.0 mg IV Versed; 50 mcg IV Fentanyl. Total Moderate Sedation Time 30 minutes. The patient's level of consciousness and physiologic status were continuously monitored during the procedure by Radiology nursing. CONTRAST:  20 mL Isovue-300 MEDICATIONS: 2 g IV Ancef. IV antibiotic was administered in an appropriate time interval prior to needle puncture of the skin. FLUOROSCOPY TIME:  1 minutes and 54 seconds. PROCEDURE: The procedure, risks, benefits, and alternatives were explained to the patient's family. Questions regarding the procedure were encouraged and answered. The patient's son understands and consents to the procedure. A 5-French catheter was then advanced through the patient's mouth under fluoroscopy into the esophagus and to the level of the stomach. This catheter was used to insufflate the stomach with air under fluoroscopy. The abdominal wall was prepped with chlorhexidine in a sterile fashion, and a sterile drape was applied covering the operative field. A sterile gown and sterile gloves were used for the procedure. Local anesthesia was provided with 1% Lidocaine. A skin incision was made in the upper abdominal wall. Under fluoroscopy, an 18 gauge  trocar needle was advanced into the stomach. Contrast injection was performed to confirm intraluminal position of the needle tip. A single T tack was then deployed in the lumen of the stomach. This was brought up to tension at the skin surface. Over a guidewire, a 9-French sheath was advanced into the lumen of the stomach. The wire was left in place as a safety wire. A loop snare device from a percutaneous gastrostomy kit was then advanced into the stomach. A floppy guide wire was advanced through the orogastric catheter under fluoroscopy in the stomach. The loop snare advanced through the percutaneous gastric access was used to snare the guide wire. This allowed withdrawal of the loop snare out of the patient's mouth by retraction of the orogastric catheter and wire. A 20-French bumper retention gastrostomy tube was looped around the snare device. It was then pulled back through the patient's mouth. The retention bumper was brought up to the anterior gastric wall. The T tack suture was cut at the skin. The exiting gastrostomy tube was cut to appropriate length and a feeding adapter applied. The catheter was injected with contrast material to confirm position and a fluoroscopic spot image saved. The tube was then flushed with saline. A dressing was applied over the gastrostomy exit site. COMPLICATIONS: None. FINDINGS: The stomach distended well with air allowing safe placement of the gastrostomy tube. After placement, the tip of the gastrostomy tube lies in the body of the stomach. IMPRESSION: Percutaneous gastrostomy with placement of a 20-French bumper retention tube in the body of the stomach. This tube can be used for percutaneous feeds beginning in 24 hours after placement. Electronically Signed   By: Aletta Edouard M.D.   On: 10/15/2018 15:01    Scheduled Meds: . budesonide (PULMICORT) nebulizer solution  0.25 mg Nebulization BID  . chlorhexidine  15 mL Mouth Rinse BID  . cloNIDine  0.3 mg Transdermal  Weekly  . enoxaparin (LOVENOX) injection  40 mg Subcutaneous Q24H  . free water  250 mL Per Tube Q8H  . levothyroxine  75 mcg Per NG tube Q0600  . LORazepam  2 mg Intravenous Once  . mouth rinse  15 mL Mouth Rinse q12n4p  . metoprolol tartrate  25 mg Per Tube BID  . sodium polystyrene  15 g  Oral Once    Admission status: Inpatient: Based on patients clinical presentation and evaluation of above clinical data, I have made determination that patient meets Inpatient criteria at this time.  Time spent: 20 min  [facility] Pager [phone]. If 7PM-7AM, please contact night-coverage at [email or web address], Office  [phone]  password [email or web address]  10/16/2018, 2:11 PM  LOS: 22 days

## 2018-10-16 NOTE — Progress Notes (Addendum)
Family wants to take patient home at d/c. They are requesting [facility] services. CM provided choice and [facility] selected.  Per therapies pt will need: hospital bed, wheelchair, lift for home.  MD please place F2F and [place] orders with orders for hospital bed, lift and wheelchair. Pt to start on TF via PEG today. If patient will required pump for home will need order for that and the TF. If pt is going to have bolus TF then will just need orders for TF.  CM following for further d/c needs and to arrange the above.   Addendum (1600): CM called orders for bed and wheelchair in to [place] with [facility] D[facility]. CM also informed Butch Penny with [facility] selection and they accepted. CM following for further needs.

## 2018-10-16 NOTE — Progress Notes (Signed)
Nutrition Follow-up   INTERVENTION:  Stop continuous tube feeds for anticipation of transitioning to bolus tube feeds.   At 1800, initiate bolus tube feeds using Osmolite 1.5 formula via PEG at starting bolus of 50 ml and increase by 50 ml every 6 hours to goal volume of 265 ml QID.  Continue free water flushes of 250 ml q 8 hours given in between bolus feeds per tube  Recommend home bolus tube feeding regimen to provide 1590 kcal (100% of needs), 66 grams of protein, 1556 ml water.   NUTRITION DIAGNOSIS:   Inadequate oral intake related to inability to eat as evidenced by NPO status; ongoing  GOAL:   Patient will meet greater than or equal to 90% of their needs; progressing  MONITOR:   PO intake, TF tolerance, Labs, Skin, Weight trends, I & O's  REASON FOR ASSESSMENT:   Consult Assessment of nutrition requirement/status, Enteral/tube feeding initiation and management  ASSESSMENT:   78 y.o. female with history of CHF with grade 1 diastolic dysfunction, chronic hyponatremia, CKD stage III, chronic anemia, hypothyroidism, and HTN. She presented to the ED with complaints of fatigue and SOB for the past 1 week. Patient also noticed increasing swelling in the lower extremities. Has had some productive cough.  Patient also has been feeling weak and fatigued easily.  PEG placed yesterday. Tube cleared by IR for use today. RD contacted via MD regarding transitioning to bolus tube feeds in anticipation of possible discharge home tomorrow. RD to modify orders. Labs and medications reviewed.   Diet Order:   Diet Order            DIET - DYS 1 Room service appropriate? Yes; Fluid consistency: Nectar Thick  Diet effective now              EDUCATION NEEDS:   Not appropriate for education at this time  Skin:  Skin Assessment: Skin Integrity Issues: Skin Integrity Issues:: Other (Comment) Other: non-pressure wound to buttocks  Last BM:  2/19  Height:   Ht Readings from Last 1  Encounters:  09/23/18 4\' 11"  (1.499 m)    Weight:   Wt Readings from Last 1 Encounters:  10/16/18 97.1 kg    Ideal Body Weight:  42.91 kg  BMI:  Body mass index is 43.22 kg/m.  Estimated Nutritional Needs:   Kcal:  1560-1820 kcal  Protein:  65-75 grams  Fluid:  >/= 1.6 L/day    Corrin Parker, MS, RD, LDN Pager # [phone] After hours/ weekend pager # [phone]

## 2018-10-16 NOTE — Progress Notes (Addendum)
    Durable Medical Equipment  (From admission, onward)         Start     Ordered   10/16/18 1447  For home use only DME lightweight manual wheelchair with seat cushion  Once    Comments:  Patient suffers from Metabolic encephalopathy which impairs their ability to perform daily activities like walking in the home.  A stick  will not resolve  issue with performing activities of daily living. A wheelchair will allow patient to safely perform daily activities. Patient is not able to propel themselves in the home using a standard weight wheelchair due to weakness. Patient can self propel in the lightweight wheelchair.  Accessories: elevating leg rests (ELRs), wheel locks, extensions and anti-tippers.   10/16/18 1447   10/16/18 1417  For home use only DME Hospital bed  Once    Question:  Bed type  Answer:  Semi-electric   10/16/18 [place], PTA Acute Rehabilitation Services Pager [phone] Office [phone]  10/16/2018  Donnella Sham, [place] [phone]  (pager) [phone]  (office)

## 2018-10-16 NOTE — Plan of Care (Signed)
  Problem: Education: Goal: Knowledge of General Education information will improve Description Including pain rating scale, medication(s)/side effects and non-pharmacologic comfort measures Outcome: Progressing   Problem: Health Behavior/Discharge Planning: Goal: Ability to manage health-related needs will improve Outcome: Progressing Note:  Discharge planning currently underway with family during my shift.    Problem: Clinical Measurements: Goal: Will remain free from infection Outcome: Progressing Note:  Pt has shown no signs of infection during my care.    Problem: Pain Managment: Goal: General experience of comfort will improve Outcome: Progressing Note:  Pt showing no signs of pain during my care,    Problem: Nutrition: Goal: Adequate nutrition will be maintained Outcome: Not Progressing Note:  Pt currently NPO sinc PEG placement during my shift. Pt receiving IVF until tube feeds are resumed.

## 2018-10-16 NOTE — Progress Notes (Signed)
  Speech Language Pathology Treatment: Dysphagia  Patient Details Name: Katie Mosley MRN: [id number] DOB: 27-Sep-1940 Today's Date: 10/16/2018 Time: 4081-4481 SLP Time Calculation (min) (ACUTE ONLY): 17 min  Assessment / Plan / Recommendation Clinical Impression  Pt was seen for skilled dysphagia treatment today. Pt's son was present and served as Veterinary surgeon throughout session. Pt accepted teaspoons of nectar thick liquid with min anterior loss and no overt s/s aspiration observed. Pt attempted 1 trial of puree, however after several attempts to form bolus and initiate swallow sequence, pt ultimately expectorated puree. Pt refused further trials. SLP educated pt and son regarding swallow precautions, purpose of PO trials, and recommendation for [facility] ST upon d/c home. Recommend resume dysphagia 1 (puree) diet (Per MBS and MD recc), nectar thick liquids with full supervision to assist with feeding and cue pt to use safe swallow strategies (multiple dry swallows after each bite and follow solids with liquid. ST will continue to follow pt to provide dysphagia intervention to ensure diet safety, efficiency, and determine potential for advancements.    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  Pt with h/o CHF, renal dysfunction. 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  MBS was conducted on 09/25/18 which revealed the following: Oral deficits significant mostly with liquids resulting in liquids spilling into pharynx not controlled and resulting in aspiration before the swallow with nectar.  Aspiration after the swallow from pyriform sinus spillage did elicit a delayed cough but not adequately strong to clear pharynx.  Chin tuck posture improved airway protection significantly and prevented aspiration as well as decreased  pharyngeal residuals.  Cued dry swallows helpful to clear oral residuals. A full liquid diet *nectar* diet was recommended at that time with allowance of thin water between meals. On 09/27/18 patient developed worsening encephalopathy and was transferred to the intensive care unit.  She was nonverbal, very confused and unable to answer questions. MRI of 09/29/18 was negative for acute changes and cortrak was placed on 09/30/18 since lethargy prohibity the pt from safely tolerating a p.o. diet.       SLP Plan  Continue with current plan of care       Recommendations                   Oral Care Recommendations: Oral care QID Follow up Recommendations: Skilled Nursing facility SLP Visit Diagnosis: Dysphagia, oropharyngeal phase (R13.12) Plan: Continue with current plan of care       Jettie Booze, Student SLP                 Jettie Booze 10/16/2018, 3:22 PM

## 2018-10-17 LAB — GLUCOSE, CAPILLARY
Glucose-Capillary: 122 mg/dL — ABNORMAL HIGH (ref 70–99)
Glucose-Capillary: 92 mg/dL (ref 70–99)
Glucose-Capillary: 175 mg/dL — ABNORMAL HIGH (ref 70–99)

## 2018-10-17 MED ORDER — BUDESONIDE 0.25 MG/2ML IN SUSP: 0.2500 mg | Freq: Two times a day (BID) | RESPIRATORY_TRACT | 12 refills | Status: AC

## 2018-10-17 MED ORDER — CLONIDINE HCL 0.1 MG PO TABS: 0.1000 mg | ORAL_TABLET | Freq: Three times a day (TID) | ORAL | 11 refills | Status: AC

## 2018-10-17 MED ORDER — LEVOTHYROXINE SODIUM 75 MCG PO TABS: 75.0000 ug | ORAL_TABLET | Freq: Every day | ORAL | 2 refills | Status: AC

## 2018-10-17 MED ORDER — OSMOLITE 1.5 CAL PO LIQD: 265.0000 mL | Freq: Four times a day (QID) | ORAL | 3 refills | Status: AC

## 2018-10-17 MED ORDER — DOCUSATE SODIUM 60 MG/15ML PO SYRP: 60.0000 mg | ORAL_SOLUTION | Freq: Two times a day (BID) | ORAL | 0 refills | Status: AC | PRN

## 2018-10-17 MED ORDER — FREE WATER: 250.0000 mL | Freq: Three times a day (TID) | Status: AC

## 2018-10-17 MED ORDER — METOPROLOL TARTRATE 25 MG/10 ML ORAL SUSPENSION: 25.0000 mg | Freq: Two times a day (BID) | ORAL | 3 refills | Status: AC

## 2018-10-17 NOTE — Discharge Summary (Addendum)
Physician Discharge Summary  Katie Mosley FVC:[id number] DOB: 04/02/1941 DOA: 09/23/2018  PCP: Nolene Ebbs, MD  Admit date: 09/23/2018 Discharge date: 10/17/2018  Time spent: 45 minutes  Recommendations for Outpatient Follow-up:  1. Follow CBC and BMP in two weeks    Discharge Diagnoses:  Principal Problem:   Sepsis ([place]) Active Problems:   Hyponatremia   Hypothyroidism   Congestive heart disease (HCC)   AKI (acute kidney injury) ([place])   Acute encephalopathy   Normocytic anemia   Leukopenia   CKD (chronic kidney disease), stage IV (HCC)   DNR (do not resuscitate) discussion   Adult failure to thrive   Palliative care by specialist   Hypernatremia   Severe swallowing dysfunction   Discharge Condition: Stable  Diet recommendation: Started on tube feedings  Filed Weights   10/11/18 0500 10/12/18 0500 10/16/18 0500  Weight: 95.7 kg 95.3 kg 97.1 kg    History of present illness:  78 year old female with a history diastolic heart failure, chronic hyponatremia, CKD stage III, chronic anemia, hypothyroidism, hypertension presents with dyspnea and weakness.Patient developed worsening encephalopathy, and was transferred to the ICU.  She was nonverbal, confused and unable to answer questions. 10/01/2018-patient was transferred to [facility] stepdown unit for further neurological work-up.  Encephalopathy was likely multifactorial, clinically she is improved, continue to have dysphagia initially family declined PEG tube but now they are okay to proceed with PEG tube.  Plan for PEG tube placement today per IR.  Patient was admitted to the hospital working diagnosis acute on chronic diastolic heart failure.   09/27/2018. Patient developed worsening encephalopathy and was transferred to the intensive care unit.  She was nonverbal, very confused and unable to answer questions.  10/01/2018.  Transferred to [facility] stepdown unit for further neurological work-up.  Encephalopathy  likely multifactorial.  Clinically improving, she continued to have swallow dysfunction, she is n.p.o., family had declined PEG tube.   Hospital Course:   Metabolic encephalopathy-slowly improving, patient was seen by neurology and all the work-up has been negative.  EEG and LP were negative.  Right-sided pneumonia-patient completed 5 days of antibiotic therapy.  Resolved.  Acute kidney injury on CKD stage III-creatinine slowly improving after holding diuretics.  Today creatinine is 1.20.  Anemia-patient's hemoglobin has dropped to 7.4, likely from poor p.o. intake.  She has been started on tube feeds.  She will need a follow-up CBC as outpatient in 2 weeks.  Acute on chronic diastolic CHF-resolved, currently patient is euvolemic.  Diuretics have been on hold due to poor p.o. intake.  Continue metoprolol 25 mg p.o. twice daily.  Hypertension-blood pressure was not well controlled, patient started on clonidine patch in the hospital.  Will convert clonidine patch to Catapres 0.1 mg p.o. 3 times daily, metoprolol 25 mg p.o. twice daily.  Hypothyroidism-Dose of Synthroid has been changed from 50 to 75 mcg p.o. daily as she had elevated TSH of 9.8.Marland KitchenContinue Synthroid 75 mcg p.o. daily.  Dysphagia-NG tube feeding was started, family agreed for PEG tube placement PEG tube was placed per IR and started on Osmolite 1.5, 265 mL 4 times a day.  Also will continue with 250 cc water every 8 hours.  Procedures:  PEG tube placement  Consultations:  IR  Neurology  Discharge Exam: Vitals:   10/17/18 0954 10/17/18 1133  BP: (!) 186/81 (!) 166/84  Pulse: 72 72  Resp: 20 20  Temp: 98 F (36.7 C) 97.8 F (36.6 C)  SpO2: 99% 98%    General: Appears  in no acute distress Cardiovascular: S1-S2, regular Respiratory: Clear to auscultation bilaterally  Discharge Instructions   Discharge Instructions    Diet - low sodium heart healthy   Complete by:  As directed    Increase activity slowly    Complete by:  As directed      Allergies as of 10/17/2018   No Known Allergies     Medication List    STOP taking these medications   albuterol 108 (90 Base) MCG/ACT inhaler Commonly known as:  PROVENTIL HFA;VENTOLIN HFA   aspirin EC 81 MG tablet   docusate sodium 100 MG capsule Commonly known as:  COLACE Replaced by:  docusate 60 MG/15ML syrup   furosemide 40 MG tablet Commonly known as:  LASIX   HYDROcodone-acetaminophen 5-325 MG tablet Commonly known as:  NORCO/VICODIN   metoprolol succinate 50 MG 24 hr tablet Commonly known as:  TOPROL-XL     TAKE these medications   acetaminophen 325 MG tablet Commonly known as:  TYLENOL Take 325 mg by mouth every 6 (six) hours as needed for moderate pain.   budesonide 0.25 MG/2ML nebulizer solution Commonly known as:  PULMICORT Take 2 mLs (0.25 mg total) by nebulization 2 (two) times daily.   cloNIDine 0.1 MG tablet Commonly known as:  CATAPRES Take 1 tablet (0.1 mg total) by mouth 3 (three) times daily.   docusate 60 MG/15ML syrup Commonly known as:  COLACE Take 15 mLs (60 mg total) by mouth 2 (two) times daily as needed. Replaces:  docusate sodium 100 MG capsule   feeding supplement (OSMOLITE 1.5 CAL) Liqd Place 265 mLs into feeding tube 4 (four) times daily.   free water Soln Place 250 mLs into feeding tube every 8 (eight) hours.   FUSION PLUS Caps Take 1 tablet by mouth daily.   levothyroxine 75 MCG tablet Commonly known as:  SYNTHROID, LEVOTHROID 1 tablet (75 mcg total) by Per NG tube route daily at 6 (six) AM. Start taking on:  October 18, 2018 What changed:    medication strength  how much to take  how to take this  when to take this   metoprolol tartrate 25 mg/10 mL Susp Commonly known as:  LOPRESSOR Place 10 mLs (25 mg total) into feeding tube 2 (two) times daily.   pantoprazole 40 MG tablet Commonly known as:  PROTONIX Take 1 tablet (40 mg total) by mouth daily.            Durable  Medical Equipment  (From admission, onward)         Start     Ordered   10/17/18 0845  For home use only DME Tube feeding  Once    Comments:  Initiate bolus tube feeds using Osmolite 1.5 formula via PEG at starting bolus of 50 ml and increase by 50 ml every 6 hours to goal volume of 265 ml QID.  Continue free water flushes of 250 ml q 8 hours given in between bolus feeds per tube  Recommend home bolus tube feeding regimen to provide 1590 kcal (100% of needs), 66 grams of protein, 1556 ml water.     10/17/18 0845   10/16/18 1557  For home use only DME Other see comment  Once    Comments:  Hoyer lift   10/16/18 1557   10/16/18 1447  For home use only DME lightweight manual wheelchair with seat cushion  Once    Comments:  Patient suffers from Metabolic encephalopathy which impairs their ability to perform daily activities  like walking in the home.  A stick  will not resolve  issue with performing activities of daily living. A wheelchair will allow patient to safely perform daily activities. Patient is not able to propel themselves in the home using a standard weight wheelchair due to weakness. Patient can self propel in the lightweight wheelchair.  Accessories: elevating leg rests (ELRs), wheel locks, extensions and anti-tippers.   10/16/18 1447   10/16/18 1417  For home use only DME Hospital bed  Once    Question:  Bed type  Answer:  Semi-electric   10/16/18 1419         No Known Allergies Follow-up Information    Nolene Ebbs, MD Follow up in 2 week(s).   Specialty:  Internal Medicine Why:  Check CBC and BMP in 2 weeks Contact information: [address] [place] [address] [phone]            The results of significant diagnostics from this hospitalization (including imaging, microbiology, ancillary and laboratory) are listed below for reference.    Significant Diagnostic Studies: Dg Chest 2 View  Result Date: 09/24/2018 CLINICAL DATA:  Shortness of  breath. EXAM: CHEST - 2 VIEW COMPARISON:  06/22/2018 and 10/16/2017 FINDINGS: There is new pulmonary vascular congestion. There is new peripheral haziness in the right lung cyst pulmonary edema. No discrete effusions. Heart is within normal limits of size considering the AP portable technique. Aortic atherosclerosis.  No acute bone abnormalities. IMPRESSION: New pulmonary vascular congestion with slight peripheral pulmonary edema on the right. Electronically Signed   By: Lorriane Shire M.D.   On: 09/24/2018 04:12   Dg Abd 1 View  Result Date: 09/28/2018 CLINICAL DATA:  Ileus EXAM: ABDOMEN - 1 VIEW COMPARISON:  09/27/2018 FINDINGS: Nonobstructive bowel gas pattern. Mild residual contrast within ascending and transverse colon. No colonic dilatation. Degenerative changes of the lumbar spine. IMPRESSION: Unremarkable abdominal radiograph. Electronically Signed   By: Julian Hy M.D.   On: 09/28/2018 05:08   Dg Abd 1 View  Result Date: 09/27/2018 CLINICAL DATA:  Encounter for feeding tube placement. EXAM: ABDOMEN - 1 VIEW COMPARISON:  Radiograph of same day.  CT scan of June 22, 2018. FINDINGS: No small bowel dilatation is noted. Residual contrast is seen in the colon. Colonic dilatation is noted suggesting possible ileus. No abnormal calcifications are noted. IMPRESSION: Colonic dilatation is noted in left side of abdomen concerning for possible ileus. Electronically Signed   By: Marijo Conception, M.D.   On: 09/27/2018 12:21   Ct Head Wo Contrast  Result Date: 09/27/2018 CLINICAL DATA:  Neurological deficits with involuntary movements. EXAM: CT HEAD WITHOUT CONTRAST TECHNIQUE: Contiguous axial images were obtained from the base of the skull through the vertex without intravenous contrast. COMPARISON:  09/24/2018 FINDINGS: Brain: Diffuse cerebral atrophy. Ventricular dilatation consistent with central atrophy. Low-attenuation changes in the deep white matter consistent with small vessel ischemia. Old  parenchymal calcifications in the basal ganglia and left cerebellum without change. No mass effect or midline shift. No abnormal extra-axial fluid collections. Gray-white matter junctions are distinct. Basal cisterns are not effaced. No acute intracranial hemorrhage. Vascular: Prominent intracranial arterial vascular calcifications are present. Skull: Calvarium appears intact. No acute displaced fractures identified. Sinuses/Orbits: Paranasal sinuses and mastoid air cells are clear. Other: No significant change since prior study. IMPRESSION: No acute intracranial abnormalities. Chronic atrophy and small vessel ischemic changes. Electronically Signed   By: Lucienne Capers M.D.   On: 09/27/2018 03:56   Ct Head Wo Contrast  Result Date: 09/24/2018 CLINICAL DATA:  78 y/o  F; 3 days of weakness. EXAM: CT HEAD WITHOUT CONTRAST TECHNIQUE: Contiguous axial images were obtained from the base of the skull through the vertex without intravenous contrast. COMPARISON:  10/16/2017 CT head. FINDINGS: Brain: No evidence of acute infarction, hemorrhage, hydrocephalus, extra-axial collection or mass lesion/mass effect. Stable dystrophic calcifications in the basal ganglia and calcifications in the left cerebellar hemisphere. 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 Stable chronic microvascular ischemic changes and volume loss of the brain. Vascular: Calcific atherosclerosis of carotid siphons. No hyperdense vessel identified. Skull: Normal. Negative for fracture or focal lesion. Sinuses/Orbits: No acute finding. Other: Bilateral intra-ocular lens replacement. IMPRESSION: 1. No acute intracranial abnormality identified. 2. Stable chronic microvascular ischemic changes and volume loss of the brain. Stable small chronic lacunar infarct in the right caudate nucleus. Electronically Signed   By: Kristine Garbe M.D.   On: 09/24/2018 05:15   Mr Brain Wo Contrast  Result Date: 09/29/2018 CLINICAL DATA:   Acute encephalopathy. Jerking movements. Progressive weakness. EXAM: MRI HEAD WITHOUT CONTRAST TECHNIQUE: Multiplanar, multiecho pulse sequences of the brain and surrounding structures were obtained without intravenous contrast. COMPARISON:  Head CT 09/27/2018 and MRI 08/08/2015 FINDINGS: Brain: There is no evidence of acute infarct, intracranial hemorrhage, mass, midline shift, or extra-axial fluid collection. Patchy to confluent T2 hyperintensities in the cerebral white matter are slightly progressed from the prior MRI and are nonspecific but compatible with moderate chronic small vessel ischemic disease. Mild T2 hyperintensity in the basal ganglia and thalami is similar to the prior MRI. 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 There is a small remote insult in the left cerebellum with associated calcification on CT. Vascular: Major intracranial vascular flow voids are preserved. Skull and upper cervical spine: Unremarkable bone marrow signal. Sinuses/Orbits: Bilateral cataract extraction. Clear paranasal sinuses. Trace mastoid effusions. Other: None. IMPRESSION: 1. No acute intracranial abnormality. 2. Moderate chronic small vessel ischemic disease and cerebral atrophy. Electronically Signed   By: Logan Bores M.D.   On: 09/29/2018 13:22   Ir Gastrostomy Tube Mod Sed  Result Date: 10/15/2018 CLINICAL DATA:  Encephalopathy, poor oral intake and need for percutaneous gastrostomy tube for nutrition. EXAM: PERCUTANEOUS GASTROSTOMY TUBE PLACEMENT ANESTHESIA/SEDATION: 1.0 mg IV Versed; 50 mcg IV Fentanyl. Total Moderate Sedation Time 30 minutes. The patient's level of consciousness and physiologic status were continuously monitored during the procedure by Radiology nursing. CONTRAST:  20 mL Isovue-300 MEDICATIONS: 2 g IV Ancef. IV antibiotic was administered in an appropriate time interval prior to needle puncture of the skin. FLUOROSCOPY TIME:  1 minutes and 54  seconds. PROCEDURE: The procedure, risks, benefits, and alternatives were explained to the patient's family. Questions regarding the procedure were encouraged and answered. The patient's son understands and consents to the procedure. A 5-French catheter was then advanced through the patient's mouth under fluoroscopy into the esophagus and to the level of the stomach. This catheter was used to insufflate the stomach with air under fluoroscopy. The abdominal wall was prepped with chlorhexidine in a sterile fashion, and a sterile drape was applied covering the operative field. A sterile gown and sterile gloves were used for the procedure. Local anesthesia was provided with 1% Lidocaine. A skin incision was made in the upper abdominal wall. Under fluoroscopy, an 18 gauge trocar needle was advanced into the stomach. Contrast injection was performed to confirm intraluminal position of the needle tip. A single T tack  was then deployed in the lumen of the stomach. This was brought up to tension at the skin surface. Over a guidewire, a 9-French sheath was advanced into the lumen of the stomach. The wire was left in place as a safety wire. A loop snare device from a percutaneous gastrostomy kit was then advanced into the stomach. A floppy guide wire was advanced through the orogastric catheter under fluoroscopy in the stomach. The loop snare advanced through the percutaneous gastric access was used to snare the guide wire. This allowed withdrawal of the loop snare out of the patient's mouth by retraction of the orogastric catheter and wire. A 20-French bumper retention gastrostomy tube was looped around the snare device. It was then pulled back through the patient's mouth. The retention bumper was brought up to the anterior gastric wall. The T tack suture was cut at the skin. The exiting gastrostomy tube was cut to appropriate length and a feeding adapter applied. The catheter was injected with contrast material to confirm  position and a fluoroscopic spot image saved. The tube was then flushed with saline. A dressing was applied over the gastrostomy exit site. COMPLICATIONS: None. FINDINGS: The stomach distended well with air allowing safe placement of the gastrostomy tube. After placement, the tip of the gastrostomy tube lies in the body of the stomach. IMPRESSION: Percutaneous gastrostomy with placement of a 20-French bumper retention tube in the body of the stomach. This tube can be used for percutaneous feeds beginning in 24 hours after placement. Electronically Signed   By: Aletta Edouard M.D.   On: 10/15/2018 15:01   Dg Chest Port 1 View  Result Date: 10/06/2018 CLINICAL DATA:  CHF EXAM: PORTABLE CHEST 1 VIEW COMPARISON:  Chest x-rays dated 10/04/2018, 09/30/2018 and 09/24/2018 FINDINGS: Stable cardiomegaly. Persistent central pulmonary vascular congestion but improved aeration of both lungs compared to the most recent study of 10/04/2018. Persistent small bilateral pleural effusions and probable associated atelectasis. Enteric tube passes below the diaphragm. IMPRESSION: 1. Improved aeration compared to the most recent chest x-ray of 10/04/2018, presumably resolving pulmonary edema related to improved fluid status. 2. Stable bibasilar opacities, likely a combination of small pleural effusions and atelectasis. Electronically Signed   By: Franki Cabot M.D.   On: 10/06/2018 20:11   Dg Chest Port 1 View  Result Date: 10/04/2018 CLINICAL DATA:  Shortness of Breath EXAM: PORTABLE CHEST 1 VIEW COMPARISON:  September 30, 2018 FINDINGS: There are pleural effusions bilaterally. There appears to be loculated pleural effusion on the left, a change from previous study. There is cardiomegaly with pulmonary venous hypertension. There is a degree of underlying interstitial edema. There is aortic atherosclerosis. No bone lesions evident. IMPRESSION: Pulmonary vascular congestion with interstitial edema and pleural effusions. There is  apparent loculated effusion on the left. A degree of superimposed airspace consolidation in this area can not be excluded. The overall appearance is felt to be most consistent with congestive heart failure. There is aortic atherosclerosis. Electronically Signed   By: Lowella Grip III M.D.   On: 10/04/2018 10:29   Dg Chest Port 1 View  Result Date: 09/30/2018 CLINICAL DATA:  Fever. EXAM: PORTABLE CHEST 1 VIEW COMPARISON:  Chest x-ray 09/27/2018. FINDINGS: Cardiomegaly with bilateral interstitial prominence. Improved aeration from prior exam. Persistent right lower lobe alveolar infiltrate. This could represent asymmetric pulmonary edema and/or pneumonia. Small bilateral pleural effusions. IMPRESSION: 1. Cardiomegaly with bilateral interstitial prominence, improved aeration from prior exam. Findings suggesting improving CHF. Small bilateral pleural effusions. 2. Persistent right  base infiltrate. This could represent asymmetric pulmonary edema and/or pneumonia. Similar findings on prior exam. Electronically Signed   By: Hostetter   On: 09/30/2018 13:34   Dg Chest Port 1 View  Result Date: 09/27/2018 CLINICAL DATA:  Acute respiratory failure. EXAM: PORTABLE CHEST 1 VIEW COMPARISON:  Chest x-ray dated September 25, 2018. FINDINGS: The patient is rotated to the left, limiting evaluation. Stable cardiomegaly. Unchanged mild interstitial edema, small bilateral pleural effusions, and bibasilar atelectasis. Apparent increased density at the right lung base is likely due to rotation. No pneumothorax. No acute osseous abnormality. IMPRESSION: 1. Unchanged mild interstitial edema, small bilateral pleural effusions, and bibasilar atelectasis. Electronically Signed   By: Titus Dubin M.D.   On: 09/27/2018 10:33   Dg Chest Port 1 View  Result Date: 09/25/2018 CLINICAL DATA:  Follow-up swallow study. EXAM: PORTABLE CHEST 1 VIEW COMPARISON:  Radiographs of September 24, 2018. FINDINGS: Stable cardiomegaly is  noted. Atherosclerosis of thoracic aorta is noted. No pneumothorax is noted. Minimal bibasilar subsegmental atelectasis is noted with minimal pleural effusions. Bony thorax is unremarkable. Triangular area of increased density is seen in the region of the upper thoracic spine; is uncertain if this represents retained contrast from prior swallowing study. Rounded barium tablet is not clearly identified. IMPRESSION: Minimal bibasilar subsegmental atelectasis is noted with minimal pleural effusions. Triangular area of increased density is seen projected over upper thoracic spine which potentially may represent residual contrast from prior swallowing study. Aortic Atherosclerosis (ICD10-I70.0). Electronically Signed   By: Marijo Conception, M.D.   On: 09/25/2018 13:18   Dg Abd Portable 1v  Result Date: 10/06/2018 CLINICAL DATA:  Evaluate feeding tube position. EXAM: PORTABLE ABDOMEN - 1 VIEW COMPARISON:  09/28/2018 FINDINGS: Feeding tube has been placed in the interval. This terminates at the antral/pyloric region. Contrast remains in normal caliber colon. Bibasilar airspace disease. No gross free intraperitoneal air IMPRESSION: Feeding tube terminating at the antral/pyloric region. Electronically Signed   By: Abigail Miyamoto M.D.   On: 10/06/2018 18:14   Dg Swallowing Func-speech Pathology  Result Date: 10/10/2018 Objective Swallowing Evaluation: Type of Study: MBS-Modified Barium Swallow Study  Patient Details Name: RENLY GUEDES MRN: [id number] Date of Birth: 02-12-1941 Today's Date: 10/10/2018 Time: SLP Start Time (ACUTE ONLY): 0935 -SLP Stop Time (ACUTE ONLY): 1000 SLP Time Calculation (min) (ACUTE ONLY): 25 min Past Medical History: Past Medical History: Diagnosis Date . Adrenal insufficiency ([place])  . Adrenal insufficiency ([place])   Dx'd [facility] 05/2015 . Asthma  . CHF (congestive heart failure) ([place])  . GERD (gastroesophageal reflux disease)  . Glaucoma  . Headache  . HTN (hypertension)  . Hypertension  .  Hypothyroidism  . Normocytic anemia 09/07/2018 . Shortness of breath dyspnea  . Thyroid disease   not know in detail Past Surgical History: Past Surgical History: Procedure Laterality Date . APPENDECTOMY   . BACK SURGERY   . CAPSULOTOMY  08/23/2011  Procedure: MINOR CAPSULOTOMY;  Surgeon: Myrtha Mantis.;  Location: [place];  Service: Ophthalmology;  Laterality: Left;  YAG Capsulotomy Left Eye . ESOPHAGOGASTRODUODENOSCOPY N/A 03/17/2013  Procedure: ESOPHAGOGASTRODUODENOSCOPY (EGD);  Surgeon: Irene Shipper, MD;  Location: [facility] ENDOSCOPY;  Service: Endoscopy;  Laterality: N/A; . LEFT CATARACT EXTRACTION    2007 . RIGHT CATARACT EXTRACTION    2006 HPI: Pt is a 78 yo female adm to [facility] with AMS x3 days, CT head + basal ganglia and left cerebellar calcification, 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  Pt with h/o  CHF, renal dysfunction. 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  MBS was conducted on 09/25/18 which revealed the following: Oral deficits significant mostly with liquids resulting in liquids spilling into pharynx not controlled and resulting in aspiration before the swallow with nectar.  Aspiration after the swallow from pyriform sinus spillage did elicit a delayed cough but not adequately strong to clear pharynx.  Chin tuck posture improved airway protection significantly and prevented aspiration as well as decreased pharyngeal residuals.  Cued dry swallows helpful to clear oral residuals. A full liquid diet *nectar* diet was recommended at that time with allowance of thin water between meals. On 09/27/18 patient developed worsening encephalopathy and was transferred to the intensive care unit.  She was nonverbal, very confused and unable to answer questions. MRI of 09/29/18 was negative for acute changes and cortrak was placed on 09/30/18 since lethargy prohibity the pt from safely tolerating a p.o. diet.  Subjective: Pt awake, but  significanly weak. Granddaughter present Assessment / Plan / Recommendation CHL IP CLINICAL IMPRESSIONS 10/10/2018 Clinical Impression Pt presents with moderate-severe oropharyngeal dysphagia characterized by oral holding, a severely increased oral transit time, a pharyngeal delay, reduced lingual retraction which resulted in moderate vallecular residue, and pyriform sinus residue secondary to reduced laryngeal excursion. No instances of penetrations/aspiration were noted during deglution but thin liquids barium was noted on the laryngeal surface of the epiglottis after the pt was layed flat for repositioning. A liquid wash was effective in reducing pharyngeal residue to a more functional level and amount of residue  Increased with bolus size. Pt is at high risk for aspiration after deglutition due to pharyngeal residue if swallowing precautions are not consistently observed. It is recommended that a puree diet with nectar thick liquids be initiated at this time with strict observance of swallowing precautions. However, with consideration of the level of support the pt needed to consistently trigger a volitional swallow, adequacy of oral intake is questioned and supplimental non-oral alimentation (e.g., G-tube) may be necessary. Pt's son, RN Kasandra Knudsen), and Dr. Cathlean Sauer were all educated regarding the results and recommendations.  SLP Visit Diagnosis Dysphagia, oropharyngeal phase (R13.12) Attention and concentration deficit following -- Frontal lobe and executive function deficit following -- Impact on safety and function Moderate aspiration risk;Severe aspiration risk;Risk for inadequate nutrition/hydration   CHL IP TREATMENT RECOMMENDATION 10/10/2018 Treatment Recommendations Therapy as outlined in treatment plan below   Prognosis 10/10/2018 Prognosis for Safe Diet Advancement Fair Barriers to Reach Goals Severity of deficits;Cognitive deficits;Behavior Barriers/Prognosis Comment -- CHL IP DIET RECOMMENDATION 10/10/2018  SLP Diet Recommendations Alternative means - long-term;Dysphagia 1 (Puree) solids;Nectar thick liquid Liquid Administration via Cup;Straw Medication Administration Crushed with puree Compensations Minimize environmental distractions;Slow rate;Small sips/bites;Multiple dry swallows after each bite/sip;Follow solids with liquid Postural Changes Seated upright at 90 degrees   CHL IP OTHER RECOMMENDATIONS 10/10/2018 Recommended Consults -- Oral Care Recommendations Oral care BID;Oral care before and after PO;Staff/trained caregiver to provide oral care Other Recommendations Order thickener from pharmacy   CHL IP FOLLOW UP RECOMMENDATIONS 10/10/2018 Follow up Recommendations Skilled Nursing facility   [facility] IP FREQUENCY AND DURATION 10/10/2018 Speech Therapy Frequency (ACUTE ONLY) min 2x/week Treatment Duration 2 weeks      CHL IP ORAL PHASE 10/10/2018 Oral Phase Impaired Oral - Pudding Teaspoon -- Oral - Pudding Cup -- Oral - Honey Teaspoon -- Oral - Honey Cup -- Oral - Nectar Teaspoon Delayed oral transit;Holding of bolus;Lingual pumping;Lingual/palatal residue Oral - Nectar  Cup -- Oral - Nectar Straw Delayed oral transit;Holding of bolus;Lingual pumping;Lingual/palatal residue Oral - Thin Teaspoon Delayed oral transit;Holding of bolus;Lingual pumping;Lingual/palatal residue Oral - Thin Cup -- Oral - Thin Straw Delayed oral transit;Holding of bolus;Lingual pumping;Lingual/palatal residue Oral - Puree Delayed oral transit;Holding of bolus;Lingual pumping;Lingual/palatal residue Oral - Mech Soft Impaired mastication;Lingual pumping;Holding of bolus Oral - Regular -- Oral - Multi-Consistency -- Oral - Pill NT Oral Phase - Comment --  CHL IP PHARYNGEAL PHASE 10/10/2018 Pharyngeal Phase Impaired Pharyngeal- Pudding Teaspoon -- Pharyngeal -- Pharyngeal- Pudding Cup -- Pharyngeal -- Pharyngeal- Honey Teaspoon -- Pharyngeal -- Pharyngeal- Honey Cup -- Pharyngeal -- Pharyngeal- Nectar Teaspoon Delayed swallow  initiation-vallecula;Delayed swallow initiation-pyriform sinuses;Reduced laryngeal elevation;Reduced airway/laryngeal closure;Reduced tongue base retraction;Pharyngeal residue - valleculae;Pharyngeal residue - pyriform;Reduced epiglottic inversion Pharyngeal -- Pharyngeal- Nectar Cup -- Pharyngeal -- Pharyngeal- Nectar Straw Delayed swallow initiation-vallecula;Delayed swallow initiation-pyriform sinuses;Reduced laryngeal elevation;Reduced airway/laryngeal closure;Reduced tongue base retraction;Pharyngeal residue - valleculae;Pharyngeal residue - pyriform Pharyngeal -- Pharyngeal- Thin Teaspoon Delayed swallow initiation-vallecula;Delayed swallow initiation-pyriform sinuses;Reduced laryngeal elevation;Reduced airway/laryngeal closure;Reduced tongue base retraction;Pharyngeal residue - valleculae;Pharyngeal residue - pyriform;Penetration/Apiration after swallow Pharyngeal -- Pharyngeal- Thin Cup -- Pharyngeal -- Pharyngeal- Thin Straw Delayed swallow initiation-vallecula;Delayed swallow initiation-pyriform sinuses;Reduced laryngeal elevation;Reduced airway/laryngeal closure;Reduced tongue base retraction;Pharyngeal residue - valleculae;Pharyngeal residue - pyriform Pharyngeal Material enters airway, remains ABOVE vocal cords and not ejected out Pharyngeal- Puree Delayed swallow initiation-vallecula;Reduced laryngeal elevation;Reduced airway/laryngeal closure;Reduced tongue base retraction;Pharyngeal residue - valleculae;Pharyngeal residue - pyriform Pharyngeal -- Pharyngeal- Mechanical Soft NT Pharyngeal -- Pharyngeal- Regular -- Pharyngeal -- Pharyngeal- Multi-consistency -- Pharyngeal -- Pharyngeal- Pill NT Pharyngeal -- Pharyngeal Comment --  CHL IP CERVICAL ESOPHAGEAL PHASE 10/10/2018 Cervical Esophageal Phase WFL Pudding Teaspoon -- Pudding Cup -- Honey Teaspoon -- Honey Cup -- Nectar Teaspoon -- Nectar Cup -- Nectar Straw -- Thin Teaspoon -- Thin Cup -- Thin Straw -- Puree -- Mechanical Soft -- Regular --  Multi-consistency -- Pill -- Cervical Esophageal Comment -- Shanika I. Hardin Negus, [place], [place] Office number [phone] Pager [place] 10/10/2018, 11:56 AM              Dg Swallowing Func-speech Pathology  Result Date: 09/25/2018 Objective Swallowing Evaluation: Type of Study: MBS-Modified Barium Swallow Study  Patient Details Name: MAYRELI ALDEN MRN: [id number] Date of Birth: 09/08/1940 Today's Date: 09/25/2018 Time: SLP Start Time (ACUTE ONLY): 0750 -SLP Stop Time (ACUTE ONLY): 0804 SLP Time Calculation (min) (ACUTE ONLY): 14 min Past Medical History: Past Medical History: Diagnosis Date . Adrenal insufficiency ([place])  . Adrenal insufficiency ([place])   Dx'd [facility] 05/2015 . Asthma  . CHF (congestive heart failure) ([place])  . GERD (gastroesophageal reflux disease)  . Glaucoma  . Headache  . HTN (hypertension)  . Hypertension  . Hypothyroidism  . Normocytic anemia 09/07/2018 . Shortness of breath dyspnea  . Thyroid disease   not know in detail Past Surgical History: Past Surgical History: Procedure Laterality Date . APPENDECTOMY   . BACK SURGERY   . CAPSULOTOMY  08/23/2011  Procedure: MINOR CAPSULOTOMY;  Surgeon: Myrtha Mantis.;  Location: [place];  Service: Ophthalmology;  Laterality: Left;  YAG Capsulotomy Left Eye . ESOPHAGOGASTRODUODENOSCOPY N/A 03/17/2013  Procedure: ESOPHAGOGASTRODUODENOSCOPY (EGD);  Surgeon: Irene Shipper, MD;  Location: [facility] ENDOSCOPY;  Service: Endoscopy;  Laterality: N/A; . LEFT CATARACT EXTRACTION    2007 . 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  Pt with h/o CHF, renal dysfunction. Swallow evauluation ordered as pt had difficulty swallowing a pill this am.  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   Subjective: pt awake in chair  Assessment / Plan / Recommendation CHL IP CLINICAL IMPRESSIONS 09/25/2018 Clinical Impression Pt's swallow function was inconsistent throughout MBS - neck extension posture significantly worsened pharyngeal contraction and thus resulted in gross residuals of puree/thin. Oral deficits significant mostly with liquids resulting in liquids spilling into pharynx not controlled and resulting in aspiration before the swallow with nectar.  Aspiration after the swallow from pyriform sinus spillage did elicit a delayed cough but not adequately strong to clear pharynx.  Chin tuck posture improved airway protection significantly and prevented aspiration as well as decreased pharyngeal residuals.  Cued dry swallows helpful to clear oral residuals.  Of note, pt appeared with significant stasis in proximal esophagus without sensation *just below UES without awareness.  Pudding consistency with tablet did not clear this area despite follow up liquid swallows.  Dependent on family's goal for pt, consideration for GI referral recommended given son's report of chronicity of dysphagia for "a long time".  Recommend to start full liquid diet *nectar* and allow thin water between meals. Use straw to faciliate chin down posture for pt.  Son present and wishes to interpret *not ipad interpreter* therefore allowed in xray.  SLP will follow up for pt tolerance, family education.   SLP Visit Diagnosis Dysphagia, oropharyngeal phase (R13.12);Dysphagia, pharyngoesophageal phase (R13.14) Attention and concentration deficit following -- Frontal lobe and executive function deficit following -- Impact on safety and function Moderate aspiration risk;Risk for inadequate nutrition/hydration   CHL IP TREATMENT RECOMMENDATION 09/25/2018 Treatment Recommendations Therapy as outlined in treatment plan below   Prognosis 09/25/2018 Prognosis for Safe Diet Advancement Fair Barriers to Reach Goals Time post onset Barriers/Prognosis Comment -- CHL IP DIET  RECOMMENDATION 09/25/2018 SLP Diet Recommendations Nectar thick liquid Liquid Administration via -- Medication Administration Crushed with puree Compensations Slow rate;Small sips/bites Postural Changes --   CHL IP OTHER RECOMMENDATIONS 09/25/2018 Recommended Consults Consider GI evaluation Oral Care Recommendations Oral care before and after PO Other Recommendations Order thickener from pharmacy;Have oral suction available   CHL IP FOLLOW UP RECOMMENDATIONS 09/25/2018 Follow up Recommendations (No Data)   CHL IP FREQUENCY AND DURATION 09/25/2018 Speech Therapy Frequency (ACUTE ONLY) min 2x/week Treatment Duration 1 week      CHL IP ORAL PHASE 09/25/2018 Oral Phase Impaired Oral - Pudding Teaspoon -- Oral - Pudding Cup -- Oral - Honey Teaspoon -- Oral - Honey Cup -- Oral - Nectar Teaspoon Weak lingual manipulation;Decreased bolus cohesion;Reduced posterior propulsion;Premature spillage Oral - Nectar Cup -- Oral - Nectar Straw Weak lingual manipulation;Decreased bolus cohesion;Reduced posterior propulsion;Premature spillage;Delayed oral transit Oral - Thin Teaspoon Decreased bolus cohesion;Weak lingual manipulation;Reduced posterior propulsion;Premature spillage;Lingual/palatal residue;Delayed oral transit Oral - Thin Cup -- Oral - Thin Straw Reduced posterior propulsion;Weak lingual manipulation;Decreased bolus cohesion;Premature spillage;Lingual/palatal residue;Delayed oral transit Oral - Puree Decreased bolus cohesion;Weak lingual manipulation;Lingual pumping;Premature spillage;Lingual/palatal residue;Delayed oral transit Oral - Mech Soft Decreased bolus cohesion;Premature spillage;Delayed oral transit Oral - Regular -- Oral - Multi-Consistency -- Oral - Pill Decreased bolus cohesion;Premature spillage;Weak lingual manipulation;Lingual pumping;Delayed oral transit Oral Phase - Comment --  CHL IP PHARYNGEAL PHASE 09/25/2018 Pharyngeal Phase Impaired Pharyngeal- Pudding Teaspoon -- Pharyngeal -- Pharyngeal- Pudding Cup --  Pharyngeal -- Pharyngeal- Honey Teaspoon -- Pharyngeal --  Pharyngeal- Honey Cup -- Pharyngeal -- Pharyngeal- Nectar Teaspoon -- Pharyngeal -- Pharyngeal- Nectar Cup -- Pharyngeal -- Pharyngeal- Nectar Straw WFL Pharyngeal -- Pharyngeal- Thin Teaspoon Penetration/Aspiration before swallow Pharyngeal Material enters airway, passes BELOW cords without attempt by patient to eject out (silent aspiration) Pharyngeal- Thin Cup -- Pharyngeal -- Pharyngeal- Thin Straw Penetration/Apiration after swallow;Pharyngeal residue - valleculae;Pharyngeal residue - pyriform Pharyngeal Material enters airway, passes BELOW cords without attempt by patient to eject out (silent aspiration);Material enters airway, passes BELOW cords and not ejected out despite cough attempt by patient Pharyngeal- Puree Reduced tongue base retraction;Reduced airway/laryngeal closure;Reduced laryngeal elevation;Pharyngeal residue - valleculae;Pharyngeal residue - pyriform;Reduced pharyngeal peristalsis Pharyngeal -- Pharyngeal- Mechanical Soft Reduced tongue base retraction;Pharyngeal residue - valleculae Pharyngeal -- Pharyngeal- Regular -- Pharyngeal -- Pharyngeal- Multi-consistency -- Pharyngeal -- Pharyngeal- Pill Pharyngeal residue - valleculae Pharyngeal -- Pharyngeal Comment pt with gross residuals in pharynx intermittently - most notably with head extended upward with swallow, dry swallow attempts are essentially not initiated as very minimal movement observed, aspiration of thin occured after the swallow as pyriform sinus residual spill into open airway - posterior trach,   CHL IP CERVICAL ESOPHAGEAL PHASE 09/25/2018 Cervical Esophageal Phase Impaired Pudding Teaspoon -- Pudding Cup -- Honey Teaspoon -- Honey Cup -- Nectar Teaspoon -- Nectar Cup -- Nectar Straw -- Thin Teaspoon -- Thin Cup -- Thin Straw -- Puree -- Mechanical Soft -- Regular -- Multi-consistency -- Pill -- Cervical Esophageal Comment pt appeared with significant residuals below UES  WITHOUT awareness, much more prominent with puree than liquids Macario Golds 09/25/2018, 10:45 AM Luanna Salk, MS [facility] SLP Acute Rehab Services Pager [phone] Office [phone]              Vas [place] Lower Extremity Venous (dvt)  Result Date: 09/24/2018  Lower Venous Study Indications: Edema.  Performing Technologist: Oliver Hum RVT  Examination Guidelines: A complete evaluation includes B-mode imaging, spectral Doppler, color Doppler, and power Doppler as needed of all accessible portions of each vessel. Bilateral testing is considered an integral part of a complete examination. Limited examinations for reoccurring indications may be performed as noted.  Right Venous Findings: +---------+---------------+---------+-----------+----------+-------+          CompressibilityPhasicitySpontaneityPropertiesSummary +---------+---------------+---------+-----------+----------+-------+ CFV      Full           Yes      Yes                          +---------+---------------+---------+-----------+----------+-------+ SFJ      Full                                                 +---------+---------------+---------+-----------+----------+-------+ FV Prox  Full                                                 +---------+---------------+---------+-----------+----------+-------+ FV Mid   Full                                                 +---------+---------------+---------+-----------+----------+-------+ FV DistalFull                                                 +---------+---------------+---------+-----------+----------+-------+  PFV      Full                                                 +---------+---------------+---------+-----------+----------+-------+ POP      Full           Yes      Yes                          +---------+---------------+---------+-----------+----------+-------+ PTV      Full                                                  +---------+---------------+---------+-----------+----------+-------+ PERO     Full                                                 +---------+---------------+---------+-----------+----------+-------+  Left Venous Findings: +---------+---------------+---------+-----------+----------+-------+          CompressibilityPhasicitySpontaneityPropertiesSummary +---------+---------------+---------+-----------+----------+-------+ CFV      Full           Yes      Yes                          +---------+---------------+---------+-----------+----------+-------+ SFJ      Full                                                 +---------+---------------+---------+-----------+----------+-------+ FV Prox  Full                                                 +---------+---------------+---------+-----------+----------+-------+ FV Mid   Full                                                 +---------+---------------+---------+-----------+----------+-------+ FV DistalFull                                                 +---------+---------------+---------+-----------+----------+-------+ PFV      Full                                                 +---------+---------------+---------+-----------+----------+-------+ POP      Full           Yes      Yes                          +---------+---------------+---------+-----------+----------+-------+  PTV      Full                                                 +---------+---------------+---------+-----------+----------+-------+ PERO     Full                                                 +---------+---------------+---------+-----------+----------+-------+    Summary: Right: There is no evidence of deep vein thrombosis in the lower extremity. No cystic structure found in the popliteal fossa. Left: There is no evidence of deep vein thrombosis in the lower extremity. No cystic structure found in the popliteal fossa.  *See  table(s) above for measurements and observations. Electronically signed by Monica Martinez MD on 09/24/2018 at 3:52:06 PM.    Final     Microbiology: No results found for this or any previous visit (from the past 240 hour(s)).   Labs: Basic Metabolic Panel: Recent Labs  Lab 10/12/18 0812 10/14/18 0449 10/15/18 0407 10/16/18 0614  NA 133* 128* 130* 129*  K 5.4* 5.4* 5.5* 4.8  CL 107 101 101 100  CO2 18* 20* 24 20*  GLUCOSE 108* 124* 84 115*  BUN 29* 28* 29* 24*  CREATININE 1.04* 1.26* 1.23* 1.20*  CALCIUM 8.3* 8.3* 8.1* 8.5*   Liver Function Tests: No results for input(s): AST, ALT, ALKPHOS, BILITOT, PROT, ALBUMIN in the last 168 hours. No results for input(s): LIPASE, AMYLASE in the last 168 hours. No results for input(s): AMMONIA in the last 168 hours. CBC: Recent Labs  Lab 10/15/18 0712  WBC 3.1*  HGB 7.4*  HCT 22.6*  MCV 93.0  PLT 249   Cardiac Enzymes: No results for input(s): CKTOTAL, CKMB, CKMBINDEX, TROPONINI in the last 168 hours. BNP: BNP (last 3 results) Recent Labs    09/24/18 1513 09/27/18 1017  BNP 1,030.5* 2,549.5*    ProBNP (last 3 results) No results for input(s): PROBNP in the last 8760 hours.  CBG: Recent Labs  Lab 10/16/18 2009 10/16/18 2337 10/17/18 0410 10/17/18 0740 10/17/18 1139  GLUCAP 118* 119* 122* 92 175*       Signed:  Oswald Hillock MD.  [facility] 10/17/2018, 12:48 PM

## 2018-10-17 NOTE — Care Management Note (Signed)
Case Management Note  Patient Details  Name: Katie Mosley MRN: [id number] Date of Birth: 02/14/1941  Subjective/Objective:                    Action/Plan: Pt discharging home with [facility] services today. Dan with [facility] made aware of the d/c. CM spoke with patient's son and the DME has been delivered to the home. Son is requesting [facility] to transport home. CM spoke with bedside RN and 2 pm agreed upon. [facility] called and transport set up. Transport form at the desk.  Son made aware of where prescriptions sent. Bedside RN provided him the pill crusher and syringes for the feedings.  CM spoke to [facility] with [facility] and they will deliver the tube feedings to the home.   Expected Discharge Date:  10/17/18               Expected Discharge Plan:  [place]  In-House Referral:     Discharge planning Services  CM Consult  Post Acute Care Choice:  Durable Medical Equipment, Home Health Choice offered to:  Adult Children  DME Arranged:  Hospital bed, Wheelchair manual, Other see comment(lift) DME Agency:  [place]:  RN, PT, OT, Nurse's Aide, Social Work [employer] Agency:  [place]  Status of Service:  Completed, signed off  If discussed at [employer], dates discussed:    Additional Comments:  Pollie Friar, RN 10/17/2018, 1:30 PM

## 2018-10-17 NOTE — Progress Notes (Signed)
Patient is discharged home via [facility] with family, patient's son at bedside, education given to patient's son on PEG tube feeding and medication administration. Medication crusher and irrigation tray given to patient's son.  Katie Mosley, demostrate knowledge of feeding though PEG tube by feeding his mother at 66.

## 2018-10-17 NOTE — Progress Notes (Signed)
SLP Cancellation Note  Patient Details Name: MILANNI AYUB MRN: [id number] DOB: 03-09-1941   Cancelled treatment:       Reason Eval/Treat Not Completed: Patient at procedure or test/unavailable. Pt has been discharged from facility.    Horton Marshall 10/17/2018, 3:49 PM

## 2018-10-19 ENCOUNTER — Emergency Department (HOSPITAL_COMMUNITY): Payer: Medicare Other

## 2018-10-19 ENCOUNTER — Inpatient Hospital Stay (HOSPITAL_COMMUNITY)
Admission: EM | Admit: 2018-10-19 | Discharge: 2018-10-26 | DRG: 208 | Disposition: E | Payer: Medicare Other | Attending: Pulmonary Disease | Admitting: Pulmonary Disease

## 2018-10-19 DIAGNOSIS — J9601 Acute respiratory failure with hypoxia: Principal | ICD-10-CM | POA: Diagnosis present

## 2018-10-19 DIAGNOSIS — N184 Chronic kidney disease, stage 4 (severe): Secondary | ICD-10-CM | POA: Diagnosis present

## 2018-10-19 DIAGNOSIS — Z66 Do not resuscitate: Secondary | ICD-10-CM | POA: Diagnosis not present

## 2018-10-19 DIAGNOSIS — R4182 Altered mental status, unspecified: Secondary | ICD-10-CM | POA: Diagnosis present

## 2018-10-19 DIAGNOSIS — Z6839 Body mass index (BMI) 39.0-39.9, adult: Secondary | ICD-10-CM | POA: Diagnosis not present

## 2018-10-19 DIAGNOSIS — R627 Adult failure to thrive: Secondary | ICD-10-CM | POA: Diagnosis present

## 2018-10-19 DIAGNOSIS — E2740 Unspecified adrenocortical insufficiency: Secondary | ICD-10-CM | POA: Diagnosis present

## 2018-10-19 DIAGNOSIS — D631 Anemia in chronic kidney disease: Secondary | ICD-10-CM | POA: Diagnosis present

## 2018-10-19 DIAGNOSIS — I130 Hypertensive heart and chronic kidney disease with heart failure and stage 1 through stage 4 chronic kidney disease, or unspecified chronic kidney disease: Secondary | ICD-10-CM | POA: Diagnosis present

## 2018-10-19 DIAGNOSIS — J9600 Acute respiratory failure, unspecified whether with hypoxia or hypercapnia: Secondary | ICD-10-CM

## 2018-10-19 DIAGNOSIS — K219 Gastro-esophageal reflux disease without esophagitis: Secondary | ICD-10-CM | POA: Diagnosis present

## 2018-10-19 DIAGNOSIS — H409 Unspecified glaucoma: Secondary | ICD-10-CM | POA: Diagnosis present

## 2018-10-19 DIAGNOSIS — Z7189 Other specified counseling: Secondary | ICD-10-CM | POA: Diagnosis not present

## 2018-10-19 DIAGNOSIS — Z7951 Long term (current) use of inhaled steroids: Secondary | ICD-10-CM

## 2018-10-19 DIAGNOSIS — I509 Heart failure, unspecified: Secondary | ICD-10-CM | POA: Diagnosis present

## 2018-10-19 DIAGNOSIS — J690 Pneumonitis due to inhalation of food and vomit: Secondary | ICD-10-CM | POA: Diagnosis present

## 2018-10-19 DIAGNOSIS — N179 Acute kidney failure, unspecified: Secondary | ICD-10-CM | POA: Diagnosis present

## 2018-10-19 DIAGNOSIS — R509 Fever, unspecified: Secondary | ICD-10-CM

## 2018-10-19 DIAGNOSIS — Z79890 Hormone replacement therapy (postmenopausal): Secondary | ICD-10-CM

## 2018-10-19 DIAGNOSIS — J9602 Acute respiratory failure with hypercapnia: Secondary | ICD-10-CM

## 2018-10-19 DIAGNOSIS — E039 Hypothyroidism, unspecified: Secondary | ICD-10-CM | POA: Diagnosis present

## 2018-10-19 DIAGNOSIS — J4520 Mild intermittent asthma, uncomplicated: Secondary | ICD-10-CM | POA: Diagnosis present

## 2018-10-19 DIAGNOSIS — Z515 Encounter for palliative care: Secondary | ICD-10-CM | POA: Diagnosis not present

## 2018-10-19 DIAGNOSIS — R1310 Dysphagia, unspecified: Secondary | ICD-10-CM | POA: Diagnosis present

## 2018-10-19 DIAGNOSIS — Z931 Gastrostomy status: Secondary | ICD-10-CM | POA: Diagnosis not present

## 2018-10-19 DIAGNOSIS — G9340 Encephalopathy, unspecified: Secondary | ICD-10-CM | POA: Diagnosis present

## 2018-10-19 LAB — CBC
HCT: 27 % — ABNORMAL LOW (ref 36.0–46.0)
Hemoglobin: 8.5 g/dL — ABNORMAL LOW (ref 12.0–15.0)
MCH: 30.5 pg (ref 26.0–34.0)
MCHC: 31.5 g/dL (ref 30.0–36.0)
MCV: 96.8 fL (ref 80.0–100.0)
Platelets: 343 10*3/uL (ref 150–400)
RBC: 2.79 MIL/uL — ABNORMAL LOW (ref 3.87–5.11)
RDW: 16.5 % — ABNORMAL HIGH (ref 11.5–15.5)
WBC: 6.3 10*3/uL (ref 4.0–10.5)
nRBC: 0 % (ref 0.0–0.2)

## 2018-10-19 LAB — I-STAT BETA HCG BLOOD, ED (MC, WL, AP ONLY): I-stat hCG, quantitative: <5 m[IU]/mL (ref <5)

## 2018-10-19 LAB — COMPREHENSIVE METABOLIC PANEL
ALT: 13 U/L (ref 0–44)
AST: 32 U/L (ref 15–41)
Albumin: 2.7 g/dL — ABNORMAL LOW (ref 3.5–5.0)
Alkaline Phosphatase: 76 U/L (ref 38–126)
Anion gap: 9 (ref 5–15)
BUN: 28 mg/dL — ABNORMAL HIGH (ref 8–23)
CO2: 23 mmol/L (ref 22–32)
Calcium: 8.7 mg/dL — ABNORMAL LOW (ref 8.9–10.3)
Chloride: 100 mmol/L (ref 98–111)
Creatinine, Ser: 1.53 mg/dL — ABNORMAL HIGH (ref 0.44–1.00)
GFR calc Af Amer: 37 mL/min — ABNORMAL LOW (ref >60)
GFR calc non Af Amer: 32 mL/min — ABNORMAL LOW (ref >60)
Glucose, Bld: 169 mg/dL — ABNORMAL HIGH (ref 70–99)
POTASSIUM: 5.1 mmol/L (ref 3.5–5.1)
SODIUM: 132 mmol/L — AB (ref 135–145)
Total Bilirubin: 0.7 mg/dL (ref 0.3–1.2)
Total Protein: 6.9 g/dL (ref 6.5–8.1)

## 2018-10-19 LAB — I-STAT TROPONIN, ED: Troponin i, poc: 0.01 ng/mL (ref 0.00–0.08)

## 2018-10-19 LAB — LACTIC ACID, PLASMA: Lactic Acid, Venous: 2 mmol/L (ref 0.5–1.9)

## 2018-10-19 LAB — BRAIN NATRIURETIC PEPTIDE: B Natriuretic Peptide: 2962.7 pg/mL — ABNORMAL HIGH (ref 0.0–100.0)

## 2018-10-19 MED ORDER — ROCURONIUM BROMIDE 50 MG/5ML IV SOLN
INTRAVENOUS | Status: AC | PRN
  Administered 2018-10-19: 50 mg via INTRAVENOUS

## 2018-10-19 MED ORDER — SODIUM CHLORIDE 0.9 % IV SOLN: INTRAVENOUS | Status: DC

## 2018-10-19 MED ORDER — VANCOMYCIN HCL IN DEXTROSE 1-5 GM/200ML-% IV SOLN: 1000.0000 mg | Freq: Once | INTRAVENOUS | Status: DC

## 2018-10-19 MED ORDER — SODIUM CHLORIDE 0.9 % IV SOLN: 500.0000 mg | INTRAVENOUS | Status: DC

## 2018-10-19 MED ORDER — MIDAZOLAM HCL 5 MG/5ML IJ SOLN
INTRAMUSCULAR | Status: AC | PRN
  Administered 2018-10-19: 2 mg via INTRAVENOUS

## 2018-10-19 MED ORDER — LEVOTHYROXINE SODIUM 75 MCG PO TABS: 75.0000 ug | ORAL_TABLET | Freq: Every day | ORAL | Status: DC

## 2018-10-19 MED ORDER — FENTANYL CITRATE (PF) 100 MCG/2ML IJ SOLN: 50.0000 ug | INTRAMUSCULAR | Status: DC | PRN

## 2018-10-19 MED ORDER — FAMOTIDINE IN NACL 20-0.9 MG/50ML-% IV SOLN: 20.0000 mg | Freq: Two times a day (BID) | INTRAVENOUS | Status: DC

## 2018-10-19 MED ORDER — ETOMIDATE 2 MG/ML IV SOLN
INTRAVENOUS | Status: AC | PRN
  Administered 2018-10-19: 20 mg via INTRAVENOUS

## 2018-10-19 MED ORDER — MIDAZOLAM HCL 2 MG/2ML IJ SOLN
INTRAMUSCULAR | Status: AC
  Filled 2018-10-19: qty 2

## 2018-10-19 MED ORDER — FENTANYL CITRATE (PF) 100 MCG/2ML IJ SOLN
INTRAMUSCULAR | Status: AC | PRN
  Administered 2018-10-19: 100 ug via INTRAVENOUS

## 2018-10-19 MED ORDER — PROPOFOL 1000 MG/100ML IV EMUL
INTRAVENOUS | Status: AC
  Filled 2018-10-19: qty 100

## 2018-10-19 MED ORDER — MORPHINE 100MG IN NS 100ML (1MG/ML) PREMIX INFUSION
5.0000 mg/h | INTRAVENOUS | Status: DC
  Administered 2018-10-19: 5 mg/h via INTRAVENOUS
  Filled 2018-10-19: qty 100

## 2018-10-19 MED ORDER — IPRATROPIUM BROMIDE 0.02 % IN SOLN
RESPIRATORY_TRACT | Status: AC
  Filled 2018-10-19: qty 2.5

## 2018-10-19 MED ORDER — FENTANYL CITRATE (PF) 100 MCG/2ML IJ SOLN
INTRAMUSCULAR | Status: AC
  Filled 2018-10-19: qty 2

## 2018-10-19 MED ORDER — PROPOFOL 1000 MG/100ML IV EMUL: 0.0000 ug/kg/min | INTRAVENOUS | Status: DC

## 2018-10-19 MED ORDER — INSULIN ASPART 100 UNIT/ML ~~LOC~~ SOLN: 0.0000 [IU] | SUBCUTANEOUS | Status: DC

## 2018-10-19 MED ORDER — FREE WATER: 250.0000 mL | Freq: Three times a day (TID) | Status: DC

## 2018-10-19 MED ORDER — SODIUM CHLORIDE 0.9 % IV SOLN
2.0000 g | Freq: Once | INTRAVENOUS | Status: AC
  Administered 2018-10-19: 2 g via INTRAVENOUS
  Filled 2018-10-19: qty 2

## 2018-10-19 MED ORDER — VANCOMYCIN HCL 10 G IV SOLR
2000.0000 mg | Freq: Once | INTRAVENOUS | Status: DC
  Filled 2018-10-19: qty 2000

## 2018-10-19 MED ORDER — PROPOFOL 1000 MG/100ML IV EMUL
5.0000 ug/kg/min | INTRAVENOUS | Status: DC
  Administered 2018-10-19: 5 ug/kg/min via INTRAVENOUS

## 2018-10-19 MED ORDER — ALBUTEROL SULFATE (2.5 MG/3ML) 0.083% IN NEBU
INHALATION_SOLUTION | RESPIRATORY_TRACT | Status: AC
  Filled 2018-10-19: qty 6

## 2018-10-19 MED ORDER — HEPARIN SODIUM (PORCINE) 5000 UNIT/ML IJ SOLN: 5000.0000 [IU] | Freq: Three times a day (TID) | INTRAMUSCULAR | Status: DC

## 2018-10-21 ENCOUNTER — Telehealth: Payer: Self-pay | Admitting: *Deleted

## 2018-10-21 NOTE — Telephone Encounter (Signed)
Received a non-original D/C from [facility]-D/C forwarded to Dr.Yacoub to be signed.

## 2018-10-24 LAB — CULTURE, BLOOD (ROUTINE X 2)
Culture: NO GROWTH
Culture: NO GROWTH
Special Requests: ADEQUATE
Special Requests: ADEQUATE

## 2018-10-26 NOTE — ED Notes (Signed)
Lots of family at bedside , morphine gtt started , family updated on plan of car e, pt resting comfortably at this time

## 2018-10-26 NOTE — ED Notes (Signed)
Pt placed on Purewick at this time.

## 2018-10-26 NOTE — ED Provider Notes (Signed)
[facility] EMERGENCY DEPARTMENT Provider Note   CSN: [id number] Arrival date & time: 10-20-18  1249    History   Chief Complaint Chief Complaint  Patient presents with  . Altered Mental Status  . Respiratory Distress    HPI IYANIA DENNE is a 78 y.o. female.     Patient presents via EMS with altered mental status, fever, and respiratory distress. Recent d/c from hospital 2 days ago with sepsis, encephalopathy. Pt was noted at home by home health caregiver to be in respiratory distress and altered. Pt not verbally responsive - level 5 caveat. EMS noted glucose 160.   The history is provided by the patient and the EMS personnel. The history is limited by the condition of the patient.  Altered Mental Status  Associated symptoms: fever     Past Medical History:  Diagnosis Date  . Adrenal insufficiency ([place])   . Adrenal insufficiency ([place])    Dx'd [facility] 05/2015  . Asthma   . CHF (congestive heart failure) ([place])   . GERD (gastroesophageal reflux disease)   . Glaucoma   . Headache   . HTN (hypertension)   . Hypertension   . Hypothyroidism   . Normocytic anemia 09/07/2018  . Shortness of breath dyspnea   . Thyroid disease    not know in detail    Patient Active Problem List   Diagnosis Date Noted  . Acute respiratory failure ([place]) 2018-10-20  . Severe swallowing dysfunction 10/10/2018  . Hypernatremia 10/08/2018  . Adult failure to thrive   . Palliative care by specialist   . Sepsis ([place]) 09/25/2018  . DNR (do not resuscitate) discussion   . Dysphagia   . Normocytic anemia 09/07/2018  . Leukopenia 09/07/2018  . CKD (chronic kidney disease), stage IV ([place]) 09/07/2018  . Constipation   . Acute encephalopathy   . Altered mental status 08/08/2015  . 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 AKI (acute kidney injury) ([place]) 08/08/2015  . Hypothermia 02/15/2015  . Pulmonary hypertension ([place]) 02/14/2015  . Elevated TSH 02/14/2015  .  Hyperkalemia 02/14/2015  . Pleuritic chest pain 02/13/2015  . GERD (gastroesophageal reflux disease)   . Asthma   . Congestive heart disease ([place])   . Asthma, mild intermittent   . Ischemic colitis ([place]) 10/31/2014  . Hyponatremia 10/31/2014  . Benign essential HTN 10/31/2014  . Hypothyroidism 10/31/2014    Past Surgical History:  Procedure Laterality Date  . APPENDECTOMY    . BACK SURGERY    . CAPSULOTOMY  08/23/2011   Procedure: MINOR CAPSULOTOMY;  Surgeon: Myrtha Mantis.;  Location: [facility];  Service: Ophthalmology;  Laterality: Left;  YAG Capsulotomy Left Eye  . ESOPHAGOGASTRODUODENOSCOPY N/A 03/17/2013   Procedure: ESOPHAGOGASTRODUODENOSCOPY (EGD);  Surgeon: Irene Shipper, MD;  Location: [facility] ENDOSCOPY;  Service: Endoscopy;  Laterality: N/A;  . IR GASTROSTOMY TUBE MOD SED  10/15/2018  . LEFT CATARACT EXTRACTION     2007  . RIGHT CATARACT EXTRACTION     2006     OB History    Gravida  0   Para  0   Term  0   Preterm  0   AB  0   Living        SAB  0   TAB  0   Ectopic  0   Multiple      Live Births               Home Medications    Prior  to Admission medications   Medication Sig Start Date End Date Taking? Authorizing Provider  acetaminophen (TYLENOL) 325 MG tablet Take 325 mg by mouth every 6 (six) hours as needed for moderate pain.     [provider]  budesonide (PULMICORT) 0.25 MG/2ML nebulizer solution Take 2 mLs (0.25 mg total) by nebulization 2 (two) times daily. 10/17/18   Oswald Hillock, MD  cloNIDine (CATAPRES) 0.1 MG tablet Take 1 tablet (0.1 mg total) by mouth 3 (three) times daily. 10/17/18 10/17/19  Oswald Hillock, MD  docusate (COLACE) 60 MG/15ML syrup Take 15 mLs (60 mg total) by mouth 2 (two) times daily as needed. 10/17/18   Oswald Hillock, MD  Iron-FA-B Cmp-C-Biot-Probiotic (FUSION PLUS) CAPS Take 1 tablet by mouth daily.    [provider]  levothyroxine (SYNTHROID, LEVOTHROID) 75 MCG tablet 1 tablet (75 mcg total)  by Per NG tube route daily at 6 (six) AM. 10/18/18   Oswald Hillock, MD  metoprolol tartrate (LOPRESSOR) 25 mg/10 mL SUSP Place 10 mLs (25 mg total) into feeding tube 2 (two) times daily. 10/17/18 02/14/19  Oswald Hillock, MD  Nutritional Supplements (FEEDING SUPPLEMENT, OSMOLITE 1.5 CAL,) LIQD Place 265 mLs into feeding tube 4 (four) times daily. 10/17/18 02/14/19  Oswald Hillock, MD  pantoprazole (PROTONIX) 40 MG tablet Take 1 tablet (40 mg total) by mouth daily. 11/05/15   Ghimire, Henreitta Leber, MD  Water For Irrigation, Sterile (FREE WATER) SOLN Place 250 mLs into feeding tube every 8 (eight) hours. 10/17/18   Oswald Hillock, MD    Family History No family history on file.  Social History Social History   Tobacco Use  . Smoking status: Never Smoker  . Smokeless tobacco: Never Used  Substance Use Topics  . Alcohol use: No  . Drug use: No     Allergies   Patient has no known allergies.   Review of Systems Review of Systems  Unable to perform ROS: Patient unresponsive  Constitutional: Positive for fever.  patient unresponsive - level 5 caveat.    Physical Exam Updated Vital Signs BP (!) 185/124   Pulse (!) 113   Resp (!) 24   Ht 1.575 m (5\' 2" )   SpO2 100%   BMI 39.14 kg/m   Physical Exam Vitals signs and nursing note reviewed.  Constitutional:      General: She is in acute distress.     Appearance: Normal appearance. She is well-developed. She is ill-appearing.  HENT:     Head: Atraumatic.     Nose: Nose normal.     Mouth/Throat:     Mouth: Mucous membranes are moist.  Eyes:     General: No scleral icterus.    Conjunctiva/sclera: Conjunctivae normal.     Pupils: Pupils are equal, round, and reactive to light.  Neck:     Musculoskeletal: Normal range of motion and neck supple. No neck rigidity or muscular tenderness.     Trachea: No tracheal deviation.  Cardiovascular:     Rate and Rhythm: Regular rhythm. Tachycardia present.     Pulses: Normal pulses.     Heart  sounds: Normal heart sounds. No murmur. No friction rub. No gallop.   Pulmonary:     Effort: Respiratory distress present.     Breath sounds: Rhonchi and rales present.  Abdominal:     General: Bowel sounds are normal. There is no distension.     Palpations: Abdomen is soft.     Tenderness: There is no  abdominal tenderness. There is no guarding.     Comments: Feeding tube site without sign of infection.   Genitourinary:    Comments: No cva tenderness.  Musculoskeletal:        General: No swelling.  Skin:    General: Skin is warm and dry.     Findings: No rash.  Neurological:     Comments: Restless. Making purposeful movement. Not verbally responsive.   Psychiatric:     Comments: In distress. Altered.       ED Treatments / Results  Labs (all labs ordered are listed, but only abnormal results are displayed) Results for orders placed or performed during the hospital encounter of 11/14/18  CBC  Result Value Ref Range   WBC 6.3 4.0 - 10.5 K/uL   RBC 2.79 (L) 3.87 - 5.11 MIL/uL   Hemoglobin 8.5 (L) 12.0 - 15.0 g/dL   HCT 27.0 (L) 36.0 - 46.0 %   MCV 96.8 80.0 - 100.0 fL   MCH 30.5 26.0 - 34.0 pg   MCHC 31.5 30.0 - 36.0 g/dL   RDW 16.5 (H) 11.5 - 15.5 %   Platelets 343 150 - 400 K/uL   nRBC 0.0 0.0 - 0.2 %  Comprehensive metabolic panel  Result Value Ref Range   Sodium 132 (L) 135 - 145 mmol/L   Potassium 5.1 3.5 - 5.1 mmol/L   Chloride 100 98 - 111 mmol/L   CO2 23 22 - 32 mmol/L   Glucose, Bld 169 (H) 70 - 99 mg/dL   BUN 28 (H) 8 - 23 mg/dL   Creatinine, Ser 1.53 (H) 0.44 - 1.00 mg/dL   Calcium 8.7 (L) 8.9 - 10.3 mg/dL   Total Protein 6.9 6.5 - 8.1 g/dL   Albumin 2.7 (L) 3.5 - 5.0 g/dL   AST 32 15 - 41 U/L   ALT 13 0 - 44 U/L   Alkaline Phosphatase 76 38 - 126 U/L   Total Bilirubin 0.7 0.3 - 1.2 mg/dL   GFR calc non Af Amer 32 (L) >60 mL/min   GFR calc Af Amer 37 (L) >60 mL/min   Anion gap 9 5 - 15  Lactic acid, plasma  Result Value Ref Range   Lactic Acid,  Venous 2.0 (HH) 0.5 - 1.9 mmol/L  I-stat troponin, ED  Result Value Ref Range   Troponin i, poc 0.01 0.00 - 0.08 ng/mL   Comment 3          I-Stat beta hCG blood, ED ([facility], [facility], [facility] only)  Result Value Ref Range   I-stat hCG, quantitative <5.0 <5 mIU/mL   Comment 3            EKG EKG Interpretation  Date/Time:  2018-11-14 12:58:56 EST Ventricular Rate:  93 PR Interval:    QRS Duration: 74 QT Interval:  341 QTC Calculation: 425 R Axis:   14 Text Interpretation:  Sinus rhythm Baseline wander No significant change since last tracing Confirmed by Lajean Saver [phone]) on 11-14-18 2:16:34 PM   Radiology Dg Chest Port 1 View  Result Date: Nov 14, 2018 CLINICAL DATA:  Status post repositioning of endotracheal tube. EXAM: PORTABLE CHEST 1 VIEW COMPARISON:  Single-view of the chest approximately 10 minutes ago. FINDINGS: Endotracheal tube has been pulled back with its tip now approximately 2 cm above the carina. No other change. IMPRESSION: ET tube now in good position.  No new abnormality Electronically Signed   By: Inge Rise M.D.   On: 2018/11/14 13:34  Dg Chest Port 1 View  Result Date: October 20, 2018 CLINICAL DATA:  Status post ET tube placement today. EXAM: PORTABLE CHEST 1 VIEW COMPARISON:  Single-view of the chest 10/06/2018. FINDINGS: Endotracheal tube is in place with its tip just within the right mainstem bronchus. The tube should be withdrawn 2.5-3 cm for better positioning. OG tube tip is in the stomach. Small bilateral pleural effusions appears somewhat decreased. There is cardiomegaly and vascular congestion. Right basilar and upper lobe airspace disease is noted. IMPRESSION: ETT tip projects just within the right mainstem bronchus. Decreased small bilateral pleural effusions. Cardiomegaly and interstitial edema. Right mid and lower lung zone airspace disease could be due to pneumonia or atelectasis. Electronically Signed   By: Inge Rise M.D.   On: 10/20/2018  13:33    Procedures Procedures (including critical care time)  Medications Ordered in ED Medications  ipratropium (ATROVENT) 0.02 % nebulizer solution (has no administration in time range)  albuterol (PROVENTIL) (2.5 MG/3ML) 0.083% nebulizer solution (has no administration in time range)  fentaNYL (SUBLIMAZE) 100 MCG/2ML injection (has no administration in time range)  midazolam (VERSED) 2 MG/2ML injection (has no administration in time range)  propofol (DIPRIVAN) 1000 MG/100ML infusion (has no administration in time range)  ceFEPIme (MAXIPIME) 2 g in sodium chloride 0.9 % 100 mL IVPB (2 g Intravenous New Bag/Given October 20, 2018 1326)  vancomycin (VANCOCIN) 2,000 mg in sodium chloride 0.9 % 500 mL IVPB (has no administration in time range)  propofol (DIPRIVAN) 1000 MG/100ML infusion (5 mcg/kg/min  97.1 kg Intravenous New Bag/Given October 20, 2018 1328)  levothyroxine (SYNTHROID, LEVOTHROID) tablet 75 mcg (has no administration in time range)  free water 250 mL (has no administration in time range)  heparin injection 5,000 Units (has no administration in time range)  famotidine (PEPCID) IVPB 20 mg premix (has no administration in time range)  0.9 %  sodium chloride infusion (has no administration in time range)  insulin aspart (novoLOG) injection 0-15 Units (has no administration in time range)  propofol (DIPRIVAN) 1000 MG/100ML infusion (has no administration in time range)  fentaNYL (SUBLIMAZE) injection 50 mcg (has no administration in time range)  fentaNYL (SUBLIMAZE) injection 50 mcg (has no administration in time range)  etomidate (AMIDATE) injection (20 mg Intravenous Given Oct 20, 2018 1300)  rocuronium (ZEMURON) injection (50 mg Intravenous Given 2018-10-20 1300)  fentaNYL (SUBLIMAZE) injection (100 mcg Intravenous Given 10/20/2018 1300)  midazolam (VERSED) 5 MG/5ML injection (2 mg Intravenous Given 10-20-2018 1300)     Initial Impression / Assessment and Plan / ED Course  I have reviewed the triage  vital signs and the nursing notes.  Pertinent labs & imaging results that were available during my care of the patient were reviewed by me and considered in my medical decision making (see chart for details).  Iv ns. Continuous pulse ox and monitor. Stat labs. Ecg.   Respiratory therapy consulted. resp support and bipap provided.   EMS reports pt febrile, 101. Code sepsis activated. Cultures sent. Iv antibiotics given.   Critical care team consulted - they intubated in ED. Etomidate/Roc IV.  Bilateral bs confirmed, co2 color change. Stat pcxr.   CXR reviewed - tube position adjusted, repeat cxr.   OG placed.   Labs reviewed - lactate mildly elev - ns bolus.   CT, and additional labs pending.   CRITICAL CARE RE: acute respiratory failure requiring intubation/mechanical ventilation, altered mental status, fever, dehydration, AKI, r/o sepsis.  Performed by: Mirna Mires Total critical care time: 75 minutes Critical care time was exclusive  of separately billable procedures and treating other patients. Critical care was necessary to treat or prevent imminent or life-threatening deterioration. Critical care was time spent personally by me on the following activities: development of treatment plan with patient and/or surrogate as well as nursing, discussions with consultants, evaluation of patient's response to treatment, examination of patient, obtaining history from patient or surrogate, ordering and performing treatments and interventions, ordering and review of laboratory studies, ordering and review of radiographic studies, pulse oximetry and re-evaluation of patient's condition.  Additional labs return - AKI. Ns bolus.  Additional workup, UA, labs, CT head per admitting/ICU team.    Final Clinical Impressions(s) / ED Diagnoses   Final diagnoses:  Acute respiratory failure ([place])  Acute respiratory failure, unspecified whether with hypoxia or hypercapnia [facility])    ED Discharge  Orders    None       Lajean Saver, MD 2018/11/06 1419

## 2018-10-26 NOTE — Progress Notes (Signed)
Dr Nelda Marseille spoke w/ family. Now full DNR w/ focus on comfort Will extubate Start morphine Admit to medsurg.   Erick Colace ACNP-BC [place] Pager # [phone] OR # [phone] if no answer

## 2018-10-26 NOTE — Progress Notes (Signed)
Patient arrived to 6N25 unresponsive.  Noted to have [place] IV intact and saline locked, RH IV intact and infusing morphine drip of 51ml/hr, Peg tube noted in LLQ and clamped, pink foam present on inner left thigh and pink foam present on sacrum.  Patient placed on 1 L O2 via [place], purwick in place, and hung 253ml bag of NS to ensure patency of IV receiving morphine.  Many family member present at bedside.  Nurse called to patient room at 1553.  Patient found to have no respirations and no pulse.  TOD 1555.  Pronounced by Samantha Crimes, RN and BB Derald Macleod, RN.  Post mortem checklist completed over time due to large number of family members in and out visiting with patient.  Per family request, all IV's, and peg tube removed, as well as pink foams.  Wasted 84.2 ml of remaining morphine drip in med room A stericycle with BB Derald Macleod at [phone].

## 2018-10-26 NOTE — ED Notes (Signed)
Report given to 6N RN. 

## 2018-10-26 NOTE — ED Triage Notes (Signed)
Pt  Here from home with aloc and sob , pt found by home health nurse this morning, cbg 160

## 2018-10-26 NOTE — Procedures (Signed)
OGT Placement my MD  Placed under direct laryngoscopy and confirmed by auscultation  Rush Farmer, M.D. [facility] Pulmonary/Critical Care Medicine. Pager: [phone]. After hours pager: [phone].

## 2018-10-26 NOTE — ED Notes (Signed)
Istat B-hcg lab mistakenly preformed on credit charge for pt. ED-Lab

## 2018-10-26 NOTE — Procedures (Signed)
Intubation Procedure Note FIFI SCHINDLER [id number] 01-13-1941  Procedure: Intubation Indications: Airway protection and maintenance  Procedure Details Consent: Unable to obtain consent because of emergent medical necessity. Time Out: Verified patient identification, verified procedure, site/side was marked, verified correct patient position, special equipment/implants available, medications/allergies/relevent history reviewed, required imaging and test results available.  Performed  Maximum sterile technique was used including gloves, hand hygiene and mask.  MAC    Evaluation Hemodynamic Status: Transient hypotension resolved spontaneously; O2 sats: stable throughout Patient's Current Condition: stable Complications: No apparent complications Patient did tolerate procedure well. Chest X-ray ordered to verify placement.  CXR: tube position acceptable.   Jennet Maduro November 06, 2018

## 2018-10-26 NOTE — H&P (Addendum)
NAME:  Katie Mosley, MRN:  [id number], DOB:  Nov 16, 1940, LOS: 0 ADMISSION DATE:  10/23/2018, CONSULTATION DATE:  2/23 REFERRING MD:  Ashok Cordia, CHIEF COMPLAINT:  Acute respiratory failure    Brief History   78 year old female being admitted 2/23 with acute respiratory failure and working diagnosis of aspiration versus healthcare associated pneumonia  History of present illness   78 year old female who was just recently discharged on February 21 following a prolonged hospitalization for multifactorial encephalopathy, failure to thrive, and Dysphagia.  She was discharged to home under the care of her son.  She was reported as a DO NOT RESUSCITATE during that hospitalization.  She presents to the hospital under the care of her son on 2/23 with worsening mental status and acute respiratory failure with respiratory rate in the 40s and extreme distress.  She was emergently intubated.  Critical care asked to admit.  Past Medical History  Subacute multifactorial encephalopathy, hypothyroidism, CKD stage IV, adult failure to thrive, normocytic anemia, hyponatremia, dysphagia.  Significant Hospital Events     Consults:    Procedures:  oett 2/23  Significant Diagnostic Tests:    Micro Data:  bcx2 2/23 uc 2/23 Respiratory culture 2/23  Antimicrobials:  vanc 2/23>>> Cefepime 2/23>>>  Interim history/subjective:    Objective   Blood pressure (Abnormal) 185/124, pulse (Abnormal) 113, resp. rate (Abnormal) 24, height 5\' 2"  (1.575 m), SpO2 100 %.    Vent Mode: PRVC FiO2 (%):  [100 %] 100 % Set Rate:  [16 bmp] 16 bmp Vt Set:  [400 mL] 400 mL PEEP:  [5 cmH20] 5 cmH20 Plateau Pressure:  [20 cmH20] 20 cmH20  No intake or output data in the 24 hours ending 23-Oct-2018 1330 There were no vitals filed for this visit.  Examination: General: 78 year old chronically ill-appearing sedated female currently on full ventilator support HENT: Temporal wasting, orally intubated mucous membranes  dry Lungs: Scattered rhonchi respirations more rhonchorous right than left Cardiovascular: Tachycardic regular rhythm Abdomen: Soft nontender PEG tube unremarkable Extremities: Warm and dry, pulses palpable, trace edema Neuro: Currently sedated GU: Due to void Dermatology: Stage I ulceration on left buttocks, also left upper posterior leg  Resolved Hospital Problem list     hypoxic respiratory failure in the setting of pneumonia.  Presumptively HCAP versus aspiration Plan   Sputum culture Full ventilator support Broad spectrum antibiotics VAP bundle  Probable sepsis secondary to pneumonia.  Currently not meeting shock criteria Plan Gentle IV hydration Culture as mentioned above Antibiotics as above  Acute/subacute encephalopathy.  This is further complicated by underlying language barrier.  Is not clear that she ever return to baseline function did have significant failure to thrive at time of discharge. Plan Holding sedating medications Serial neuro exams  Adult Failure to thrive: Review of medical records little puzzling.  Has had DNR and full CODE STATUS in past.  I suspect language barrier playing a role here, although it certainly seems as functional status has progressively worsened flexed Plan Cont supportive care Will need to work ion goals of care  CKD stage III Plan F/u pending labs  At risk for fluid and electrolyte imbalance Plan Trend chemistry  Anemia of chronic disease-> hgb 7.4 Plan Trend cbc  Hypothyroidism  Plan Cont synthroid   Dysphagia Plan tubefeeds      Best  practice:  Diet: tubefeeds Pain/Anxiety/Delirium protocol (if indicated): PAD protocol VAP protocol (if indicated): ordered DVT prophylaxis: Alsey heparin GI prophylaxis: PPI Glucose control: ssi Mobility: BR Code Status: full code; but talking  to family Family Communication: pending Disposition: critically ill but also chronically ill. Appears to have new PNA resulting in  respiratory failure   Labs   CBC: Recent Labs  Lab 10/15/18 0712  WBC 3.1*  HGB 7.4*  HCT 22.6*  MCV 93.0  PLT 557    Basic Metabolic Panel: Recent Labs  Lab 10/14/18 0449 10/15/18 0407 10/16/18 0614  NA 128* 130* 129*  K 5.4* 5.5* 4.8  CL 101 101 100  CO2 20* 24 20*  GLUCOSE 124* 84 115*  BUN 28* 29* 24*  CREATININE 1.26* 1.23* 1.20*  CALCIUM 8.3* 8.1* 8.5*   GFR: Estimated Creatinine Clearance: 42 mL/min (A) (by C-G formula based on SCr of 1.2 mg/dL (H)). Recent Labs  Lab 10/15/18 0712  WBC 3.1*    Liver Function Tests: No results for input(s): AST, ALT, ALKPHOS, BILITOT, PROT, ALBUMIN in the last 168 hours. No results for input(s): LIPASE, AMYLASE in the last 168 hours. No results for input(s): AMMONIA in the last 168 hours.  ABG    Component Value Date/Time   PHART 7.371 09/27/2018 0902   PCO2ART 32.3 09/27/2018 0902   PO2ART 104 09/27/2018 0902   HCO3 18.2 (L) 09/27/2018 0902   TCO2 22 08/07/2015 2329   ACIDBASEDEF 5.8 (H) 09/27/2018 0902   O2SAT 97.8 09/27/2018 0902     Coagulation Profile: Recent Labs  Lab 10/15/18 0712  INR 1.00    Cardiac Enzymes: No results for input(s): CKTOTAL, CKMB, CKMBINDEX, TROPONINI in the last 168 hours.  HbA1C: Hgb A1c MFr Bld  Date/Time Value Ref Range Status  02/13/2015 06:34 AM 6.0 (H) 4.8 - 5.6 % Final    Comment:    (NOTE)         Pre-diabetes: 5.7 - 6.4         Diabetes: >6.4         Glycemic control for adults with diabetes: <7.0     CBG: Recent Labs  Lab 10/16/18 2009 10/16/18 2337 10/17/18 0410 10/17/18 0740 10/17/18 1139  GLUCAP 118* 119* 122* 92 175*    Review of Systems:   Not able   Past Medical History  She,  has a past medical history of Adrenal insufficiency ([place]), Adrenal insufficiency ([place]), Asthma, CHF (congestive heart failure) ([place]), GERD (gastroesophageal reflux disease), Glaucoma, Headache, HTN (hypertension), Hypertension, Hypothyroidism, Normocytic anemia  (09/07/2018), Shortness of breath dyspnea, and Thyroid disease.   Surgical History    Past Surgical History:  Procedure Laterality Date  . APPENDECTOMY    . BACK SURGERY    . CAPSULOTOMY  08/23/2011   Procedure: MINOR CAPSULOTOMY;  Surgeon: Myrtha Mantis.;  Location: [facility];  Service: Ophthalmology;  Laterality: Left;  YAG Capsulotomy Left Eye  . ESOPHAGOGASTRODUODENOSCOPY N/A 03/17/2013   Procedure: ESOPHAGOGASTRODUODENOSCOPY (EGD);  Surgeon: Irene Shipper, MD;  Location: [facility] ENDOSCOPY;  Service: Endoscopy;  Laterality: N/A;  . IR GASTROSTOMY TUBE MOD SED  10/15/2018  . LEFT CATARACT EXTRACTION     2007  . RIGHT CATARACT EXTRACTION     2006     Social History   reports that she has never smoked. She has never used smokeless tobacco. She reports that she does not drink alcohol or use drugs.   Family History   Her family history is not on file.   Allergies No Known Allergies   Home Medications  Prior to Admission medications   Medication Sig Start Date End Date Taking? Authorizing Provider  acetaminophen (TYLENOL) 325 MG tablet Take 325  mg by mouth every 6 (six) hours as needed for moderate pain.     [provider]  budesonide (PULMICORT) 0.25 MG/2ML nebulizer solution Take 2 mLs (0.25 mg total) by nebulization 2 (two) times daily. 10/17/18   Oswald Hillock, MD  cloNIDine (CATAPRES) 0.1 MG tablet Take 1 tablet (0.1 mg total) by mouth 3 (three) times daily. 10/17/18 10/17/19  Oswald Hillock, MD  docusate (COLACE) 60 MG/15ML syrup Take 15 mLs (60 mg total) by mouth 2 (two) times daily as needed. 10/17/18   Oswald Hillock, MD  Iron-FA-B Cmp-C-Biot-Probiotic (FUSION PLUS) CAPS Take 1 tablet by mouth daily.    [provider]  levothyroxine (SYNTHROID, LEVOTHROID) 75 MCG tablet 1 tablet (75 mcg total) by Per NG tube route daily at 6 (six) AM. 10/18/18   Oswald Hillock, MD  metoprolol tartrate (LOPRESSOR) 25 mg/10 mL SUSP Place 10 mLs (25 mg total) into feeding tube 2 (two)  times daily. 10/17/18 02/14/19  Oswald Hillock, MD  Nutritional Supplements (FEEDING SUPPLEMENT, OSMOLITE 1.5 CAL,) LIQD Place 265 mLs into feeding tube 4 (four) times daily. 10/17/18 02/14/19  Oswald Hillock, MD  pantoprazole (PROTONIX) 40 MG tablet Take 1 tablet (40 mg total) by mouth daily. 11/05/15   Ghimire, Henreitta Leber, MD  Water For Irrigation, Sterile (FREE WATER) SOLN Place 250 mLs into feeding tube every 8 (eight) hours. 10/17/18   Oswald Hillock, MD     Critical care time:  35 min     Erick Colace ACNP-BC [place] Pager # [phone] OR # [phone] if no answer  Attending Note:  78 year old female with extensive PMH who presents with a repeat aspiration and respiratory failure.  Patient was not protecting her airway on exam.  Patient was confirmed full code per EDP and was intubated.  30 minutes later the patient's family arrived.  I had an extensive conversation with them about her overall health and the fact that she has not been thriving.  After discussion, decision was made to make patient a full DNR and comfort care.  Patient was started on morphine and was extubated and maintained as comfort measures only at this point.  The patient is critically ill with multiple organ systems failure and requires high complexity decision making for assessment and support, frequent evaluation and titration of therapies, application of advanced monitoring technologies and extensive interpretation of multiple databases.   Critical Care Time devoted to patient care services described in this note is  45  Minutes. This time reflects time of care of this signee Dr Jennet Maduro. This critical care time does not reflect procedure time, or teaching time or supervisory time of PA/NP/Med student/Med Resident etc but could involve care discussion time.  Rush Farmer, M.D. [facility] Pulmonary/Critical Care Medicine. Pager: [phone]. After hours pager: [phone].

## 2018-10-26 DEATH — deceased

## 2018-10-27 ENCOUNTER — Telehealth: Payer: Self-pay

## 2018-10-27 NOTE — Telephone Encounter (Signed)
Received dc back from Doctor Byrum.  I called Vital records and mailed the dc to vital records.

## 2018-10-29 LAB — FUNGUS CULTURE RESULT

## 2018-10-29 LAB — FUNGAL ORGANISM REFLEX

## 2018-10-29 LAB — FUNGUS CULTURE WITH STAIN

## 2018-11-07 NOTE — Telephone Encounter (Signed)
Received original D/C -D/C will be forwarded to Dr.Byrum to be signed.

## 2018-11-11 ENCOUNTER — Telehealth: Payer: Self-pay | Admitting: Emergency Medicine

## 2018-11-26 NOTE — Death Summary Note (Signed)
DEATH SUMMARY   Patient Details  Name: Katie Mosley MRN: [id number] DOB: 02/08/1941  Admission/Discharge Information   Admit Date:  11-05-18  Date of Death: Date of Death: 2018-11-05  Time of Death: Time of Death: 11-05-53  Length of Stay: 1  Referring Physician: Nolene Ebbs, MD   Reason(s) for Hospitalization  Acute hypoxemic respiratory failure  Diagnoses  Preliminary cause of death:   Acute hypoxemic respiratory failure  Secondary Diagnoses (including complications and co-morbidities):  Active Problems:   Acute respiratory failure [facility]) Aspiration pneumonia   Brief Hospital Course (including significant findings, care, treatment, and services provided and events leading to death)  78 year old female who was just recently discharged on February 21 following a prolonged hospitalization for multifactorial encephalopathy, failure to thrive, and Dysphagia.  She was discharged to home under the care of her son.  She was reported as a DO NOT RESUSCITATE during that hospitalization.  She presents to the hospital under the care of her son on 11-05-22 with worsening mental status and acute respiratory failure with respiratory rate in the 40s and extreme distress.  She was emergently intubated.  Critical care asked to admit.  Met with family, they do not wish for any aggressive measures and opted for comfort.  Patient was started on morphine drip, extubated to expire shortly thereafter with the family bedside.    Pertinent Labs and Studies  Significant Diagnostic Studies Ir Gastrostomy Tube Mod Sed  Result Date: 10/15/2018 CLINICAL DATA:  Encephalopathy, poor oral intake and need for percutaneous gastrostomy tube for nutrition. EXAM: PERCUTANEOUS GASTROSTOMY TUBE PLACEMENT ANESTHESIA/SEDATION: 1.0 mg IV Versed; 50 mcg IV Fentanyl. Total Moderate Sedation Time 30 minutes. The patient's level of consciousness and physiologic status were continuously monitored during the procedure by  Radiology nursing. CONTRAST:  20 mL Isovue-300 MEDICATIONS: 2 g IV Ancef. IV antibiotic was administered in an appropriate time interval prior to needle puncture of the skin. FLUOROSCOPY TIME:  1 minutes and 54 seconds. PROCEDURE: The procedure, risks, benefits, and alternatives were explained to the patient's family. Questions regarding the procedure were encouraged and answered. The patient's son understands and consents to the procedure. A 5-French catheter was then advanced through the patient's mouth under fluoroscopy into the esophagus and to the level of the stomach. This catheter was used to insufflate the stomach with air under fluoroscopy. The abdominal wall was prepped with chlorhexidine in a sterile fashion, and a sterile drape was applied covering the operative field. A sterile gown and sterile gloves were used for the procedure. Local anesthesia was provided with 1% Lidocaine. A skin incision was made in the upper abdominal wall. Under fluoroscopy, an 18 gauge trocar needle was advanced into the stomach. Contrast injection was performed to confirm intraluminal position of the needle tip. A single T tack was then deployed in the lumen of the stomach. This was brought up to tension at the skin surface. Over a guidewire, a 9-French sheath was advanced into the lumen of the stomach. The wire was left in place as a safety wire. A loop snare device from a percutaneous gastrostomy kit was then advanced into the stomach. A floppy guide wire was advanced through the orogastric catheter under fluoroscopy in the stomach. The loop snare advanced through the percutaneous gastric access was used to snare the guide wire. This allowed withdrawal of the loop snare out of the patient's mouth by retraction of the orogastric catheter and wire. A 20-French bumper retention gastrostomy tube was looped around the snare device.  It was then pulled back through the patient's mouth. The retention bumper was brought up to the  anterior gastric wall. The T tack suture was cut at the skin. The exiting gastrostomy tube was cut to appropriate length and a feeding adapter applied. The catheter was injected with contrast material to confirm position and a fluoroscopic spot image saved. The tube was then flushed with saline. A dressing was applied over the gastrostomy exit site. COMPLICATIONS: None. FINDINGS: The stomach distended well with air allowing safe placement of the gastrostomy tube. After placement, the tip of the gastrostomy tube lies in the body of the stomach. IMPRESSION: Percutaneous gastrostomy with placement of a 20-French bumper retention tube in the body of the stomach. This tube can be used for percutaneous feeds beginning in 24 hours after placement. Electronically Signed   By: Aletta Edouard M.D.   On: 10/15/2018 15:01   Dg Chest Port 1 View  Result Date: 10-29-18 CLINICAL DATA:  Status post repositioning of endotracheal tube. EXAM: PORTABLE CHEST 1 VIEW COMPARISON:  Single-view of the chest approximately 10 minutes ago. FINDINGS: Endotracheal tube has been pulled back with its tip now approximately 2 cm above the carina. No other change. IMPRESSION: ET tube now in good position.  No new abnormality Electronically Signed   By: Inge Rise M.D.   On: 10/29/2018 13:34   Dg Chest Port 1 View  Result Date: Oct 29, 2018 CLINICAL DATA:  Status post ET tube placement today. EXAM: PORTABLE CHEST 1 VIEW COMPARISON:  Single-view of the chest 10/06/2018. FINDINGS: Endotracheal tube is in place with its tip just within the right mainstem bronchus. The tube should be withdrawn 2.5-3 cm for better positioning. OG tube tip is in the stomach. Small bilateral pleural effusions appears somewhat decreased. There is cardiomegaly and vascular congestion. Right basilar and upper lobe airspace disease is noted. IMPRESSION: ETT tip projects just within the right mainstem bronchus. Decreased small bilateral pleural effusions.  Cardiomegaly and interstitial edema. Right mid and lower lung zone airspace disease could be due to pneumonia or atelectasis. Electronically Signed   By: Inge Rise M.D.   On: 10/29/2018 13:33   Dg Chest Port 1 View  Result Date: 10/06/2018 CLINICAL DATA:  CHF EXAM: PORTABLE CHEST 1 VIEW COMPARISON:  Chest x-rays dated 10/04/2018, 09/30/2018 and 09/24/2018 FINDINGS: Stable cardiomegaly. Persistent central pulmonary vascular congestion but improved aeration of both lungs compared to the most recent study of 10/04/2018. Persistent small bilateral pleural effusions and probable associated atelectasis. Enteric tube passes below the diaphragm. IMPRESSION: 1. Improved aeration compared to the most recent chest x-ray of 10/04/2018, presumably resolving pulmonary edema related to improved fluid status. 2. Stable bibasilar opacities, likely a combination of small pleural effusions and atelectasis. Electronically Signed   By: Franki Cabot M.D.   On: 10/06/2018 20:11   Dg Chest Port 1 View  Result Date: 10/04/2018 CLINICAL DATA:  Shortness of Breath EXAM: PORTABLE CHEST 1 VIEW COMPARISON:  September 30, 2018 FINDINGS: There are pleural effusions bilaterally. There appears to be loculated pleural effusion on the left, a change from previous study. There is cardiomegaly with pulmonary venous hypertension. There is a degree of underlying interstitial edema. There is aortic atherosclerosis. No bone lesions evident. IMPRESSION: Pulmonary vascular congestion with interstitial edema and pleural effusions. There is apparent loculated effusion on the left. A degree of superimposed airspace consolidation in this area can not be excluded. The overall appearance is felt to be most consistent with congestive heart failure. There is aortic  atherosclerosis. Electronically Signed   By: Lowella Grip III M.D.   On: 10/04/2018 10:29   Dg Chest Port 1 View  Result Date: 09/30/2018 CLINICAL DATA:  Fever. EXAM: PORTABLE CHEST 1  VIEW COMPARISON:  Chest x-ray 09/27/2018. FINDINGS: Cardiomegaly with bilateral interstitial prominence. Improved aeration from prior exam. Persistent right lower lobe alveolar infiltrate. This could represent asymmetric pulmonary edema and/or pneumonia. Small bilateral pleural effusions. IMPRESSION: 1. Cardiomegaly with bilateral interstitial prominence, improved aeration from prior exam. Findings suggesting improving CHF. Small bilateral pleural effusions. 2. Persistent right base infiltrate. This could represent asymmetric pulmonary edema and/or pneumonia. Similar findings on prior exam. Electronically Signed   By: Marcello Moores  Register   On: 09/30/2018 13:34   Dg Abd Portable 1v  Result Date: 10/06/2018 CLINICAL DATA:  Evaluate feeding tube position. EXAM: PORTABLE ABDOMEN - 1 VIEW COMPARISON:  09/28/2018 FINDINGS: Feeding tube has been placed in the interval. This terminates at the antral/pyloric region. Contrast remains in normal caliber colon. Bibasilar airspace disease. No gross free intraperitoneal air IMPRESSION: Feeding tube terminating at the antral/pyloric region. Electronically Signed   By: Abigail Miyamoto M.D.   On: 10/06/2018 18:14   Dg Swallowing Func-speech Pathology  Result Date: 10/10/2018 Objective Swallowing Evaluation: Type of Study: MBS-Modified Barium Swallow Study  Patient Details Name: Katie Mosley MRN: [id number] Date of Birth: [birth year] Today's Date: 10/10/2018 Time: SLP Start Time (ACUTE ONLY): 0935 -SLP Stop Time (ACUTE ONLY): 1000 SLP Time Calculation (min) (ACUTE ONLY): 25 min Past Medical History: Past Medical History: Diagnosis Date . Adrenal insufficiency ([place])  . Adrenal insufficiency ([place])   Dx'd [facility] 05/2015 . Asthma  . CHF (congestive heart failure) ([place])  . GERD (gastroesophageal reflux disease)  . Glaucoma  . Headache  . HTN (hypertension)  . Hypertension  . Hypothyroidism  . Normocytic anemia 09/07/2018 . Shortness of breath dyspnea  . Thyroid disease   not know in detail  Past Surgical History: Past Surgical History: Procedure Laterality Date . APPENDECTOMY   . BACK SURGERY   . CAPSULOTOMY  08/23/2011  Procedure: MINOR CAPSULOTOMY;  Surgeon: Myrtha Mantis.;  Location: [facility];  Service: Ophthalmology;  Laterality: Left;  YAG Capsulotomy Left Eye . ESOPHAGOGASTRODUODENOSCOPY N/A 03/17/2013  Procedure: ESOPHAGOGASTRODUODENOSCOPY (EGD);  Surgeon: Irene Shipper, MD;  Location: [facility] ENDOSCOPY;  Service: Endoscopy;  Laterality: N/A; . LEFT CATARACT EXTRACTION    2007 . RIGHT CATARACT EXTRACTION    2006 HPI: Pt is a 78 yo female adm to 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  Pt with h/o CHF, renal dysfunction. 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  MBS was conducted on 09/25/18 which revealed the following: Oral deficits significant mostly with liquids resulting in liquids spilling into pharynx not controlled and resulting in aspiration before the swallow with nectar.  Aspiration after the swallow from pyriform sinus spillage did elicit a delayed cough but not adequately strong to clear pharynx.  Chin tuck posture improved airway protection significantly and prevented aspiration as well as decreased pharyngeal residuals.  Cued dry swallows helpful to clear oral residuals. A full liquid diet *nectar* diet was recommended at that time with allowance of thin water between meals. On 09/27/18 patient developed worsening encephalopathy and was transferred to the intensive care unit.  She was nonverbal, very confused and unable to answer questions. MRI of 09/29/18 was negative  for acute changes and cortrak was placed on 09/30/18 since lethargy prohibity the pt from safely tolerating a p.o. diet.  Subjective: Pt awake, but significanly weak. Granddaughter present Assessment / Plan / Recommendation CHL IP CLINICAL IMPRESSIONS 10/10/2018 Clinical  Impression Pt presents with moderate-severe oropharyngeal dysphagia characterized by oral holding, a severely increased oral transit time, a pharyngeal delay, reduced lingual retraction which resulted in moderate vallecular residue, and pyriform sinus residue secondary to reduced laryngeal excursion. No instances of penetrations/aspiration were noted during deglution but thin liquids barium was noted on the laryngeal surface of the epiglottis after the pt was layed flat for repositioning. A liquid wash was effective in reducing pharyngeal residue to a more functional level and amount of residue  Increased with bolus size. Pt is at high risk for aspiration after deglutition due to pharyngeal residue if swallowing precautions are not consistently observed. It is recommended that a puree diet with nectar thick liquids be initiated at this time with strict observance of swallowing precautions. However, with consideration of the level of support the pt needed to consistently trigger a volitional swallow, adequacy of oral intake is questioned and supplimental non-oral alimentation (e.g., G-tube) may be necessary. Pt's son, RN Kasandra Knudsen), and Dr. Cathlean Sauer were all educated regarding the results and recommendations.  SLP Visit Diagnosis Dysphagia, oropharyngeal phase (R13.12) Attention and concentration deficit following -- Frontal lobe and executive function deficit following -- Impact on safety and function Moderate aspiration risk;Severe aspiration risk;Risk for inadequate nutrition/hydration   CHL IP TREATMENT RECOMMENDATION 10/10/2018 Treatment Recommendations Therapy as outlined in treatment plan below   Prognosis 10/10/2018 Prognosis for Safe Diet Advancement Fair Barriers to Reach Goals Severity of deficits;Cognitive deficits;Behavior Barriers/Prognosis Comment -- CHL IP DIET RECOMMENDATION 10/10/2018 SLP Diet Recommendations Alternative means - long-term;Dysphagia 1 (Puree) solids;Nectar thick liquid Liquid Administration  via Cup;Straw Medication Administration Crushed with puree Compensations Minimize environmental distractions;Slow rate;Small sips/bites;Multiple dry swallows after each bite/sip;Follow solids with liquid Postural Changes Seated upright at 90 degrees   CHL IP OTHER RECOMMENDATIONS 10/10/2018 Recommended Consults -- Oral Care Recommendations Oral care BID;Oral care before and after PO;Staff/trained caregiver to provide oral care Other Recommendations Order thickener from pharmacy   CHL IP FOLLOW UP RECOMMENDATIONS 10/10/2018 Follow up Recommendations Skilled Nursing facility   [facility] IP FREQUENCY AND DURATION 10/10/2018 Speech Therapy Frequency (ACUTE ONLY) min 2x/week Treatment Duration 2 weeks      CHL IP ORAL PHASE 10/10/2018 Oral Phase Impaired Oral - Pudding Teaspoon -- Oral - Pudding Cup -- Oral - Honey Teaspoon -- Oral - Honey Cup -- Oral - Nectar Teaspoon Delayed oral transit;Holding of bolus;Lingual pumping;Lingual/palatal residue Oral - Nectar Cup -- Oral - Nectar Straw Delayed oral transit;Holding of bolus;Lingual pumping;Lingual/palatal residue Oral - Thin Teaspoon Delayed oral transit;Holding of bolus;Lingual pumping;Lingual/palatal residue Oral - Thin Cup -- Oral - Thin Straw Delayed oral transit;Holding of bolus;Lingual pumping;Lingual/palatal residue Oral - Puree Delayed oral transit;Holding of bolus;Lingual pumping;Lingual/palatal residue Oral - Mech Soft Impaired mastication;Lingual pumping;Holding of bolus Oral - Regular -- Oral - Multi-Consistency -- Oral - Pill NT Oral Phase - Comment --  CHL IP PHARYNGEAL PHASE 10/10/2018 Pharyngeal Phase Impaired Pharyngeal- Pudding Teaspoon -- Pharyngeal -- Pharyngeal- Pudding Cup -- Pharyngeal -- Pharyngeal- Honey Teaspoon -- Pharyngeal -- Pharyngeal- Honey Cup -- Pharyngeal -- Pharyngeal- Nectar Teaspoon Delayed swallow initiation-vallecula;Delayed swallow initiation-pyriform sinuses;Reduced laryngeal elevation;Reduced airway/laryngeal closure;Reduced tongue base  retraction;Pharyngeal residue - valleculae;Pharyngeal residue - pyriform;Reduced epiglottic inversion Pharyngeal -- Pharyngeal- Nectar Cup -- Pharyngeal -- Pharyngeal- Nectar Straw Delayed swallow initiation-vallecula;Delayed  swallow initiation-pyriform sinuses;Reduced laryngeal elevation;Reduced airway/laryngeal closure;Reduced tongue base retraction;Pharyngeal residue - valleculae;Pharyngeal residue - pyriform Pharyngeal -- Pharyngeal- Thin Teaspoon Delayed swallow initiation-vallecula;Delayed swallow initiation-pyriform sinuses;Reduced laryngeal elevation;Reduced airway/laryngeal closure;Reduced tongue base retraction;Pharyngeal residue - valleculae;Pharyngeal residue - pyriform;Penetration/Apiration after swallow Pharyngeal -- Pharyngeal- Thin Cup -- Pharyngeal -- Pharyngeal- Thin Straw Delayed swallow initiation-vallecula;Delayed swallow initiation-pyriform sinuses;Reduced laryngeal elevation;Reduced airway/laryngeal closure;Reduced tongue base retraction;Pharyngeal residue - valleculae;Pharyngeal residue - pyriform Pharyngeal Material enters airway, remains ABOVE vocal cords and not ejected out Pharyngeal- Puree Delayed swallow initiation-vallecula;Reduced laryngeal elevation;Reduced airway/laryngeal closure;Reduced tongue base retraction;Pharyngeal residue - valleculae;Pharyngeal residue - pyriform Pharyngeal -- Pharyngeal- Mechanical Soft NT Pharyngeal -- Pharyngeal- Regular -- Pharyngeal -- Pharyngeal- Multi-consistency -- Pharyngeal -- Pharyngeal- Pill NT Pharyngeal -- Pharyngeal Comment --  CHL IP CERVICAL ESOPHAGEAL PHASE 10/10/2018 Cervical Esophageal Phase WFL Pudding Teaspoon -- Pudding Cup -- Honey Teaspoon -- Honey Cup -- Nectar Teaspoon -- Nectar Cup -- Nectar Straw -- Thin Teaspoon -- Thin Cup -- Thin Straw -- Puree -- Mechanical Soft -- Regular -- Multi-consistency -- Pill -- Cervical Esophageal Comment -- Shanika I. Hardin Negus, [place], [place] Office number [phone]  Pager [place] 10/10/2018, 11:56 AM               Microbiology No results found for this or any previous visit (from the past 240 hour(s)).  Lab Basic Metabolic Panel: No results for input(s): NA, K, CL, CO2, GLUCOSE, BUN, CREATININE, CALCIUM, MG, PHOS in the last 168 hours. Liver Function Tests: No results for input(s): AST, ALT, ALKPHOS, BILITOT, PROT, ALBUMIN in the last 168 hours. No results for input(s): LIPASE, AMYLASE in the last 168 hours. No results for input(s): AMMONIA in the last 168 hours. CBC: No results for input(s): WBC, NEUTROABS, HGB, HCT, MCV, PLT in the last 168 hours. Cardiac Enzymes: No results for input(s): CKTOTAL, CKMB, CKMBINDEX, TROPONINI in the last 168 hours. Sepsis Labs: No results for input(s): PROCALCITON, WBC, LATICACIDVEN in the last 168 hours.  Procedures/Operations     , 10/30/2018, 7:51 AM

## 2019-02-21 IMAGING — XA IR PERC PLACEMENT GASTROSTOMY
1 series · 2 of 2 positions shown · non-contrast
Comparison: none

CLINICAL DATA: Encephalopathy, poor oral intake and need for
percutaneous gastrostomy tube for nutrition.

[Series 300: dsa body · 2 of 2 slices shown]
[im 1/2]
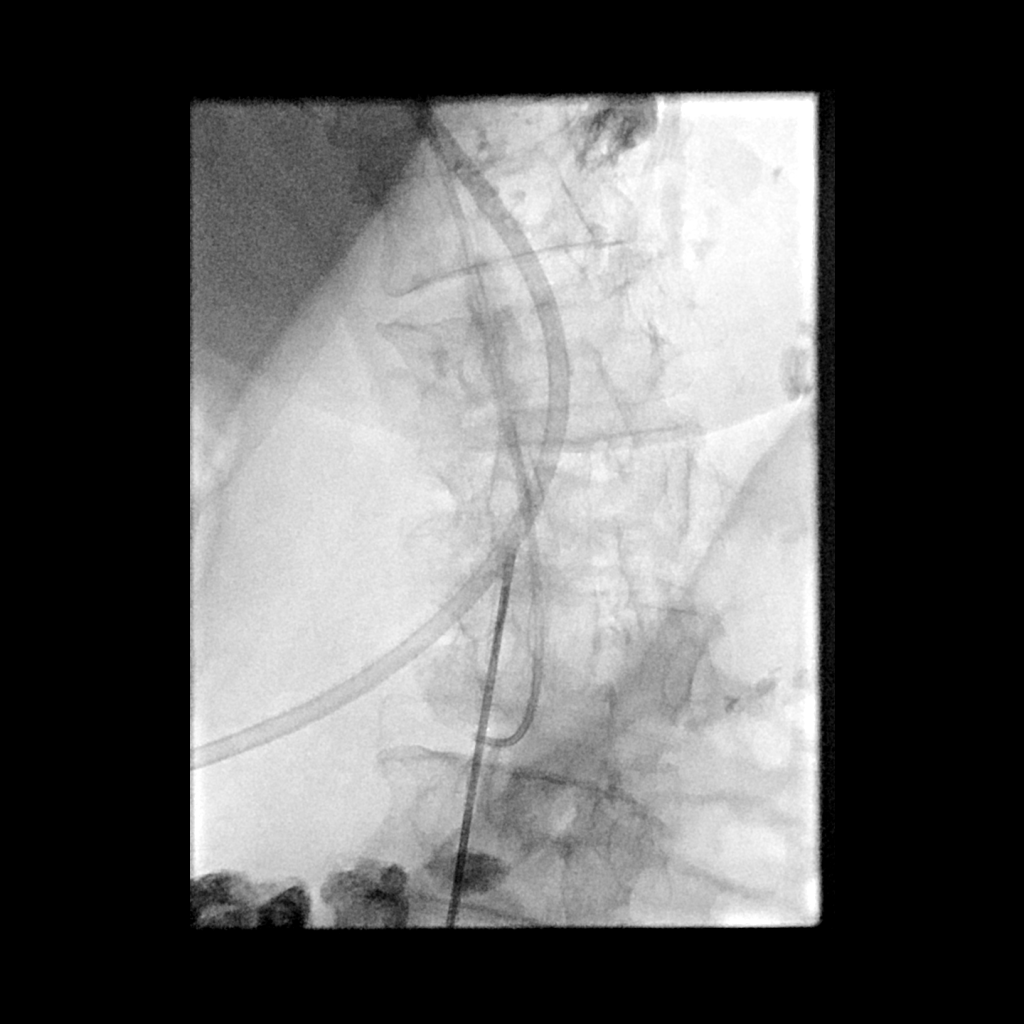
[im 2/2]
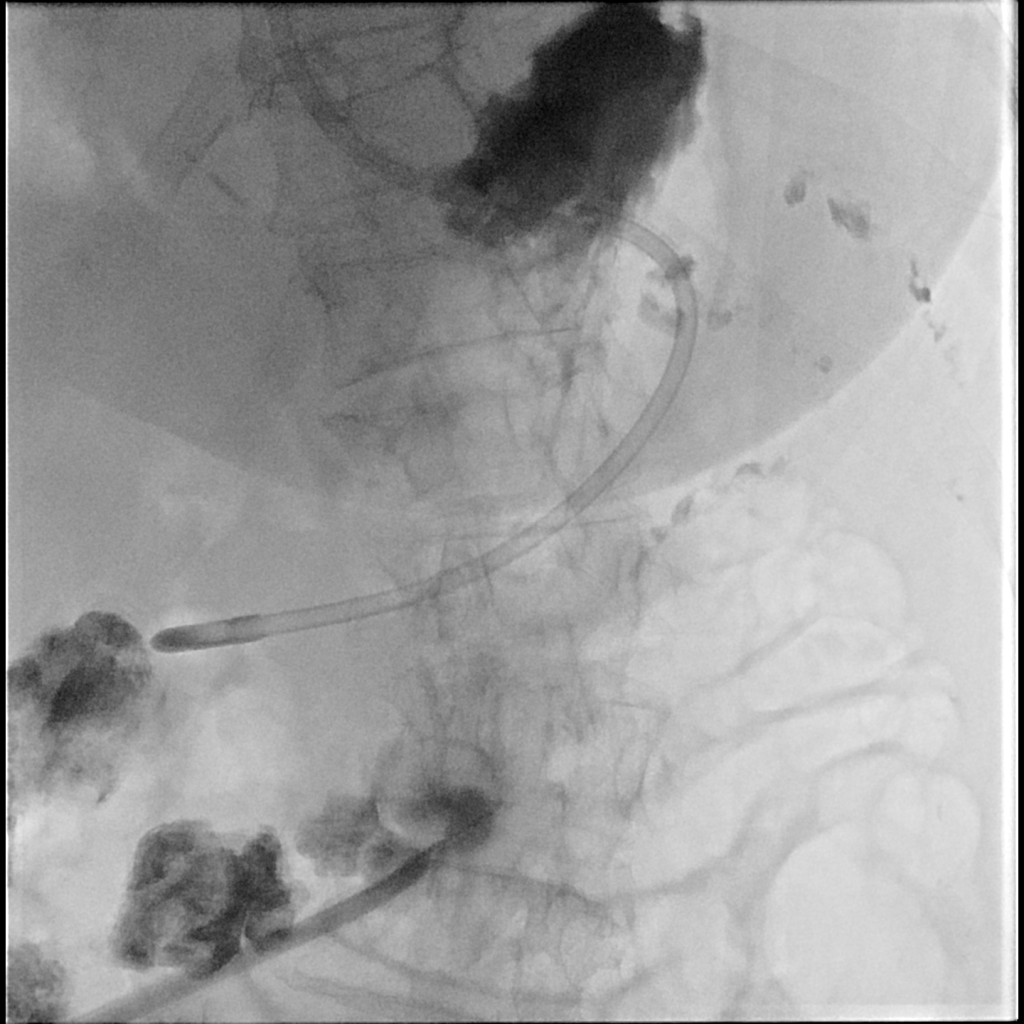

[2 of 2 positions shown; findings below may reference images not displayed]

EXAM:
PERCUTANEOUS GASTROSTOMY TUBE PLACEMENT

ANESTHESIA/SEDATION:
1.0 mg IV Versed; 50 mcg IV Fentanyl.

Total Moderate Sedation Time

30 minutes.

The patient's level of consciousness and physiologic status were
continuously monitored during the procedure by Radiology nursing.

CONTRAST:  20 mL 4sovue-MUU

MEDICATIONS:
2 g IV Ancef. IV antibiotic was administered in an appropriate time
interval prior to needle puncture of the skin.

FLUOROSCOPY TIME:  1 minutes and 54 seconds.

PROCEDURE:
The procedure, risks, benefits, and alternatives were explained to
the patient's family. Questions regarding the procedure were
encouraged and answered. The patient's son understands and consents
to the procedure.

A 5-French catheter was then advanced through the patient's mouth
under fluoroscopy into the esophagus and to the level of the
stomach. This catheter was used to insufflate the stomach with air
under fluoroscopy.

The abdominal wall was prepped with chlorhexidine in a sterile
fashion, and a sterile drape was applied covering the operative
field. A sterile gown and sterile gloves were used for the
procedure. Local anesthesia was provided with 1% Lidocaine.

A skin incision was made in the upper abdominal wall. Under
fluoroscopy, an 18 gauge trocar needle was advanced into the
stomach. Contrast injection was performed to confirm intraluminal
position of the needle tip. A single T tack was then deployed in the
lumen of the stomach. This was brought up to tension at the skin
surface.

Over a guidewire, a 9-French sheath was advanced into the lumen of
the stomach. The wire was left in place as a safety wire. A loop
snare device from a percutaneous gastrostomy kit was then advanced
into the stomach.

A floppy guide wire was advanced through the orogastric catheter
under fluoroscopy in the stomach. The loop snare advanced through
the percutaneous gastric access was used to snare the guide wire.
This allowed withdrawal of the loop snare out of the patient's mouth
by retraction of the orogastric catheter and wire.

A 20-French bumper retention gastrostomy tube was looped around the
snare device. It was then pulled back through the patient's mouth.
The retention bumper was brought up to the anterior gastric wall.
The T tack suture was cut at the skin. The exiting gastrostomy tube
was cut to appropriate length and a feeding adapter applied. The
catheter was injected with contrast material to confirm position and
a fluoroscopic spot image saved. The tube was then flushed with
saline. A dressing was applied over the gastrostomy exit site.

COMPLICATIONS:
None.
FINDINGS: The stomach distended well with air allowing safe placement of the
gastrostomy tube. After placement, the tip of the gastrostomy tube
lies in the body of the stomach.
IMPRESSION: Percutaneous gastrostomy with placement of a 20-French bumper
retention tube in the body of the stomach. This tube can be used for
percutaneous feeds beginning in 24 hours after placement.
